# Patient Record
Sex: Female | Born: 1960 | Race: Black or African American | Hispanic: No | Marital: Single | Smoking: Former smoker
Health system: Southern US, Community
[De-identification: ages and names within clinical notes are randomized; demographics above are authoritative.]

## PROBLEM LIST (undated history)

## (undated) DIAGNOSIS — E7800 Pure hypercholesterolemia, unspecified: Secondary | ICD-10-CM

## (undated) DIAGNOSIS — F419 Anxiety disorder, unspecified: Secondary | ICD-10-CM

## (undated) DIAGNOSIS — K2100 Gastro-esophageal reflux disease with esophagitis, without bleeding: Secondary | ICD-10-CM

## (undated) DIAGNOSIS — D649 Anemia, unspecified: Secondary | ICD-10-CM

## (undated) DIAGNOSIS — I959 Hypotension, unspecified: Secondary | ICD-10-CM

## (undated) DIAGNOSIS — F329 Major depressive disorder, single episode, unspecified: Secondary | ICD-10-CM

## (undated) DIAGNOSIS — R002 Palpitations: Secondary | ICD-10-CM

## (undated) DIAGNOSIS — T783XXA Angioneurotic edema, initial encounter: Secondary | ICD-10-CM

## (undated) DIAGNOSIS — N951 Menopausal and female climacteric states: Secondary | ICD-10-CM

## (undated) DIAGNOSIS — F32A Depression, unspecified: Secondary | ICD-10-CM

## (undated) DIAGNOSIS — K429 Umbilical hernia without obstruction or gangrene: Secondary | ICD-10-CM

## (undated) DIAGNOSIS — E669 Obesity, unspecified: Secondary | ICD-10-CM

## (undated) DIAGNOSIS — G4730 Sleep apnea, unspecified: Secondary | ICD-10-CM

## (undated) DIAGNOSIS — K219 Gastro-esophageal reflux disease without esophagitis: Secondary | ICD-10-CM

## (undated) DIAGNOSIS — Z8669 Personal history of other diseases of the nervous system and sense organs: Secondary | ICD-10-CM

## (undated) HISTORY — DX: Palpitations: R00.2

## (undated) HISTORY — DX: Menopausal and female climacteric states: N95.1

## (undated) HISTORY — PX: COLONOSCOPY: SHX174

## (undated) HISTORY — DX: Gastro-esophageal reflux disease with esophagitis: K21.0

## (undated) HISTORY — DX: Major depressive disorder, single episode, unspecified: F32.9

## (undated) HISTORY — DX: Pure hypercholesterolemia, unspecified: E78.00

## (undated) HISTORY — DX: Anemia, unspecified: D64.9

## (undated) HISTORY — DX: Gastro-esophageal reflux disease with esophagitis, without bleeding: K21.00

## (undated) HISTORY — PX: ECTOPIC PREGNANCY SURGERY: SHX613

## (undated) HISTORY — DX: Anxiety disorder, unspecified: F41.9

## (undated) HISTORY — DX: Gastro-esophageal reflux disease without esophagitis: K21.9

## (undated) HISTORY — DX: Angioneurotic edema, initial encounter: T78.3XXA

## (undated) HISTORY — DX: Sleep apnea, unspecified: G47.30

## (undated) HISTORY — DX: Depression, unspecified: F32.A

## (undated) HISTORY — DX: Personal history of other diseases of the nervous system and sense organs: Z86.69

## (undated) HISTORY — PX: HERPES SIMPLEX VIRUS DFA: LAB15028

## (undated) HISTORY — DX: Umbilical hernia without obstruction or gangrene: K42.9

## (undated) HISTORY — DX: Obesity, unspecified: E66.9

---

## 1985-08-20 HISTORY — PX: TUBAL LIGATION: SHX77

## 1998-06-07 ENCOUNTER — Emergency Department (HOSPITAL_COMMUNITY): Admission: EM | Admit: 1998-06-07 | Discharge: 1998-06-08 | Payer: Self-pay | Admitting: Emergency Medicine

## 1998-06-07 ENCOUNTER — Encounter: Payer: Self-pay | Admitting: Emergency Medicine

## 1998-08-20 HISTORY — PX: EXPLORATORY LAPAROTOMY: SUR591

## 1998-09-23 ENCOUNTER — Emergency Department (HOSPITAL_COMMUNITY): Admission: EM | Admit: 1998-09-23 | Discharge: 1998-09-23 | Payer: Self-pay | Admitting: Emergency Medicine

## 1998-11-24 ENCOUNTER — Ambulatory Visit (HOSPITAL_COMMUNITY): Admission: RE | Admit: 1998-11-24 | Discharge: 1998-11-24 | Payer: Self-pay | Admitting: Obstetrics & Gynecology

## 1999-01-16 ENCOUNTER — Emergency Department (HOSPITAL_COMMUNITY): Admission: EM | Admit: 1999-01-16 | Discharge: 1999-01-16 | Payer: Self-pay | Admitting: Emergency Medicine

## 1999-06-30 ENCOUNTER — Other Ambulatory Visit: Admission: RE | Admit: 1999-06-30 | Discharge: 1999-07-06 | Payer: Self-pay

## 2000-01-21 ENCOUNTER — Emergency Department (HOSPITAL_COMMUNITY): Admission: EM | Admit: 2000-01-21 | Discharge: 2000-01-21 | Payer: Self-pay | Admitting: Emergency Medicine

## 2000-01-22 ENCOUNTER — Encounter: Payer: Self-pay | Admitting: Emergency Medicine

## 2000-01-22 ENCOUNTER — Emergency Department (HOSPITAL_COMMUNITY): Admission: EM | Admit: 2000-01-22 | Discharge: 2000-01-22 | Payer: Self-pay | Admitting: Emergency Medicine

## 2000-06-19 ENCOUNTER — Emergency Department (HOSPITAL_COMMUNITY): Admission: EM | Admit: 2000-06-19 | Discharge: 2000-06-19 | Payer: Self-pay | Admitting: Emergency Medicine

## 2000-11-28 ENCOUNTER — Emergency Department (HOSPITAL_COMMUNITY): Admission: EM | Admit: 2000-11-28 | Discharge: 2000-11-28 | Payer: Self-pay | Admitting: Emergency Medicine

## 2001-03-08 ENCOUNTER — Emergency Department (HOSPITAL_COMMUNITY): Admission: EM | Admit: 2001-03-08 | Discharge: 2001-03-08 | Payer: Self-pay | Admitting: Emergency Medicine

## 2001-09-05 ENCOUNTER — Encounter: Payer: Self-pay | Admitting: Emergency Medicine

## 2001-09-05 ENCOUNTER — Emergency Department (HOSPITAL_COMMUNITY): Admission: EM | Admit: 2001-09-05 | Discharge: 2001-09-05 | Payer: Self-pay | Admitting: Emergency Medicine

## 2001-09-18 ENCOUNTER — Encounter: Payer: Self-pay | Admitting: Emergency Medicine

## 2001-09-18 ENCOUNTER — Ambulatory Visit (HOSPITAL_COMMUNITY): Admission: RE | Admit: 2001-09-18 | Discharge: 2001-09-18 | Payer: Self-pay | Admitting: Emergency Medicine

## 2001-12-16 ENCOUNTER — Emergency Department (HOSPITAL_COMMUNITY): Admission: EM | Admit: 2001-12-16 | Discharge: 2001-12-16 | Payer: Self-pay | Admitting: Emergency Medicine

## 2002-01-26 ENCOUNTER — Emergency Department (HOSPITAL_COMMUNITY): Admission: EM | Admit: 2002-01-26 | Discharge: 2002-01-27 | Payer: Self-pay | Admitting: Emergency Medicine

## 2002-03-12 ENCOUNTER — Emergency Department (HOSPITAL_COMMUNITY): Admission: EM | Admit: 2002-03-12 | Discharge: 2002-03-12 | Payer: Self-pay | Admitting: Emergency Medicine

## 2002-05-06 ENCOUNTER — Emergency Department (HOSPITAL_COMMUNITY): Admission: EM | Admit: 2002-05-06 | Discharge: 2002-05-06 | Payer: Self-pay | Admitting: Emergency Medicine

## 2002-05-07 ENCOUNTER — Ambulatory Visit (HOSPITAL_COMMUNITY): Admission: RE | Admit: 2002-05-07 | Discharge: 2002-05-07 | Payer: Self-pay | Admitting: Emergency Medicine

## 2002-05-07 ENCOUNTER — Encounter: Payer: Self-pay | Admitting: Emergency Medicine

## 2002-05-13 ENCOUNTER — Emergency Department (HOSPITAL_COMMUNITY): Admission: EM | Admit: 2002-05-13 | Discharge: 2002-05-13 | Payer: Self-pay | Admitting: Emergency Medicine

## 2002-07-05 ENCOUNTER — Emergency Department (HOSPITAL_COMMUNITY): Admission: EM | Admit: 2002-07-05 | Discharge: 2002-07-06 | Payer: Self-pay | Admitting: Emergency Medicine

## 2002-09-25 ENCOUNTER — Emergency Department (HOSPITAL_COMMUNITY): Admission: EM | Admit: 2002-09-25 | Discharge: 2002-09-25 | Payer: Self-pay | Admitting: Emergency Medicine

## 2002-10-06 ENCOUNTER — Emergency Department (HOSPITAL_COMMUNITY): Admission: EM | Admit: 2002-10-06 | Discharge: 2002-10-06 | Payer: Self-pay | Admitting: Emergency Medicine

## 2002-12-17 ENCOUNTER — Emergency Department (HOSPITAL_COMMUNITY): Admission: EM | Admit: 2002-12-17 | Discharge: 2002-12-17 | Payer: Self-pay | Admitting: Emergency Medicine

## 2003-03-09 ENCOUNTER — Emergency Department (HOSPITAL_COMMUNITY): Admission: EM | Admit: 2003-03-09 | Discharge: 2003-03-09 | Payer: Self-pay | Admitting: Emergency Medicine

## 2003-07-06 ENCOUNTER — Emergency Department (HOSPITAL_COMMUNITY): Admission: EM | Admit: 2003-07-06 | Discharge: 2003-07-06 | Payer: Self-pay | Admitting: Emergency Medicine

## 2004-01-16 ENCOUNTER — Emergency Department (HOSPITAL_COMMUNITY): Admission: EM | Admit: 2004-01-16 | Discharge: 2004-01-16 | Payer: Self-pay | Admitting: Emergency Medicine

## 2004-05-09 ENCOUNTER — Ambulatory Visit: Payer: Self-pay | Admitting: Nurse Practitioner

## 2004-05-11 ENCOUNTER — Encounter: Admission: RE | Admit: 2004-05-11 | Discharge: 2004-05-11 | Payer: Self-pay | Admitting: Family Medicine

## 2004-05-26 ENCOUNTER — Ambulatory Visit: Payer: Self-pay | Admitting: *Deleted

## 2004-06-19 ENCOUNTER — Ambulatory Visit: Payer: Self-pay | Admitting: Nurse Practitioner

## 2004-06-26 ENCOUNTER — Ambulatory Visit (HOSPITAL_COMMUNITY): Admission: RE | Admit: 2004-06-26 | Discharge: 2004-06-26 | Payer: Self-pay | Admitting: Internal Medicine

## 2004-08-08 ENCOUNTER — Ambulatory Visit: Payer: Self-pay | Admitting: Nurse Practitioner

## 2004-12-11 ENCOUNTER — Ambulatory Visit: Payer: Self-pay | Admitting: Internal Medicine

## 2004-12-29 ENCOUNTER — Ambulatory Visit: Payer: Self-pay | Admitting: Internal Medicine

## 2005-07-22 ENCOUNTER — Emergency Department (HOSPITAL_COMMUNITY): Admission: EM | Admit: 2005-07-22 | Discharge: 2005-07-22 | Payer: Self-pay | Admitting: Emergency Medicine

## 2005-09-14 ENCOUNTER — Emergency Department (HOSPITAL_COMMUNITY): Admission: EM | Admit: 2005-09-14 | Discharge: 2005-09-14 | Payer: Self-pay | Admitting: Emergency Medicine

## 2005-10-03 ENCOUNTER — Ambulatory Visit: Payer: Self-pay | Admitting: Family Medicine

## 2005-10-31 ENCOUNTER — Ambulatory Visit: Payer: Self-pay | Admitting: Family Medicine

## 2005-10-31 DIAGNOSIS — D649 Anemia, unspecified: Secondary | ICD-10-CM | POA: Insufficient documentation

## 2005-11-13 ENCOUNTER — Encounter: Admission: RE | Admit: 2005-11-13 | Discharge: 2005-11-13 | Payer: Self-pay | Admitting: Family Medicine

## 2005-12-04 ENCOUNTER — Ambulatory Visit: Payer: Self-pay | Admitting: Family Medicine

## 2005-12-28 ENCOUNTER — Emergency Department (HOSPITAL_COMMUNITY): Admission: EM | Admit: 2005-12-28 | Discharge: 2005-12-28 | Payer: Self-pay | Admitting: Emergency Medicine

## 2006-05-29 ENCOUNTER — Emergency Department (HOSPITAL_COMMUNITY): Admission: EM | Admit: 2006-05-29 | Discharge: 2006-05-29 | Payer: Self-pay | Admitting: Emergency Medicine

## 2007-03-10 ENCOUNTER — Ambulatory Visit: Payer: Self-pay | Admitting: Family Medicine

## 2007-03-10 LAB — CONVERTED CEMR LAB
Albumin: 4.4 g/dL (ref 3.5–5.2)
Alkaline Phosphatase: 44 units/L (ref 39–117)
BUN: 7 mg/dL (ref 6–23)
Basophils Relative: 0 % (ref 0–1)
Calcium: 9.4 mg/dL (ref 8.4–10.5)
Chloride: 107 meq/L (ref 96–112)
Eosinophils Absolute: 0.3 10*3/uL (ref 0.0–0.7)
Eosinophils Relative: 6 % — ABNORMAL HIGH (ref 0–5)
FSH: 34.3 milliintl units/mL
LH: 30.7 milliintl units/mL
Lymphocytes Relative: 51 % — ABNORMAL HIGH (ref 12–46)
Lymphs Abs: 2.2 10*3/uL (ref 0.7–3.3)
Monocytes Absolute: 0.3 10*3/uL (ref 0.2–0.7)
Platelets: 186 10*3/uL (ref 150–400)
RBC: 4.04 M/uL (ref 3.87–5.11)
RDW: 13.8 % (ref 11.5–14.0)
TSH: 1.7 microintl units/mL (ref 0.350–5.50)
Total Protein: 6.6 g/dL (ref 6.0–8.3)

## 2007-03-18 ENCOUNTER — Ambulatory Visit: Payer: Self-pay | Admitting: Family Medicine

## 2007-03-18 ENCOUNTER — Encounter (INDEPENDENT_AMBULATORY_CARE_PROVIDER_SITE_OTHER): Payer: Self-pay | Admitting: Family Medicine

## 2007-03-18 LAB — CONVERTED CEMR LAB

## 2007-04-14 DIAGNOSIS — B353 Tinea pedis: Secondary | ICD-10-CM | POA: Insufficient documentation

## 2007-04-14 DIAGNOSIS — F319 Bipolar disorder, unspecified: Secondary | ICD-10-CM | POA: Insufficient documentation

## 2007-04-14 HISTORY — DX: Bipolar disorder, unspecified: F31.9

## 2007-05-19 ENCOUNTER — Emergency Department (HOSPITAL_COMMUNITY): Admission: EM | Admit: 2007-05-19 | Discharge: 2007-05-19 | Payer: Self-pay | Admitting: Emergency Medicine

## 2007-08-18 ENCOUNTER — Telehealth (INDEPENDENT_AMBULATORY_CARE_PROVIDER_SITE_OTHER): Payer: Self-pay | Admitting: Nurse Practitioner

## 2007-09-18 ENCOUNTER — Encounter (INDEPENDENT_AMBULATORY_CARE_PROVIDER_SITE_OTHER): Payer: Self-pay | Admitting: Family Medicine

## 2007-09-18 ENCOUNTER — Ambulatory Visit: Payer: Self-pay | Admitting: Cardiology

## 2007-09-26 ENCOUNTER — Ambulatory Visit: Payer: Self-pay

## 2007-09-26 ENCOUNTER — Encounter: Payer: Self-pay | Admitting: Cardiology

## 2007-10-07 ENCOUNTER — Ambulatory Visit: Payer: Self-pay | Admitting: Cardiology

## 2007-10-29 ENCOUNTER — Emergency Department (HOSPITAL_COMMUNITY): Admission: EM | Admit: 2007-10-29 | Discharge: 2007-10-29 | Payer: Self-pay | Admitting: Emergency Medicine

## 2008-01-08 ENCOUNTER — Emergency Department (HOSPITAL_COMMUNITY): Admission: EM | Admit: 2008-01-08 | Discharge: 2008-01-09 | Payer: Self-pay | Admitting: Emergency Medicine

## 2008-02-17 ENCOUNTER — Telehealth (INDEPENDENT_AMBULATORY_CARE_PROVIDER_SITE_OTHER): Payer: Self-pay | Admitting: *Deleted

## 2008-03-17 ENCOUNTER — Encounter (INDEPENDENT_AMBULATORY_CARE_PROVIDER_SITE_OTHER): Payer: Self-pay | Admitting: Nurse Practitioner

## 2008-03-17 ENCOUNTER — Ambulatory Visit: Payer: Self-pay | Admitting: Family Medicine

## 2008-03-17 DIAGNOSIS — N760 Acute vaginitis: Secondary | ICD-10-CM | POA: Insufficient documentation

## 2008-04-07 ENCOUNTER — Emergency Department (HOSPITAL_COMMUNITY): Admission: EM | Admit: 2008-04-07 | Discharge: 2008-04-07 | Payer: Self-pay | Admitting: Emergency Medicine

## 2008-05-04 ENCOUNTER — Ambulatory Visit: Payer: Self-pay | Admitting: Family Medicine

## 2008-05-04 ENCOUNTER — Other Ambulatory Visit: Admission: RE | Admit: 2008-05-04 | Discharge: 2008-05-04 | Payer: Self-pay | Admitting: Family Medicine

## 2008-05-04 ENCOUNTER — Encounter (INDEPENDENT_AMBULATORY_CARE_PROVIDER_SITE_OTHER): Payer: Self-pay | Admitting: Family Medicine

## 2008-05-04 LAB — CONVERTED CEMR LAB
Chlamydia, DNA Probe: NEGATIVE
GC Probe Amp, Genital: NEGATIVE
Glucose, Urine, Semiquant: NEGATIVE
Protein, U semiquant: NEGATIVE
Urobilinogen, UA: 0.2
pH: 6.5

## 2008-05-09 ENCOUNTER — Encounter (INDEPENDENT_AMBULATORY_CARE_PROVIDER_SITE_OTHER): Payer: Self-pay | Admitting: Family Medicine

## 2008-06-24 ENCOUNTER — Emergency Department (HOSPITAL_COMMUNITY): Admission: EM | Admit: 2008-06-24 | Discharge: 2008-06-24 | Payer: Self-pay | Admitting: Emergency Medicine

## 2008-08-12 ENCOUNTER — Emergency Department (HOSPITAL_COMMUNITY): Admission: EM | Admit: 2008-08-12 | Discharge: 2008-08-12 | Payer: Self-pay | Admitting: Emergency Medicine

## 2008-08-30 ENCOUNTER — Telehealth (INDEPENDENT_AMBULATORY_CARE_PROVIDER_SITE_OTHER): Payer: Self-pay | Admitting: Family Medicine

## 2008-09-09 ENCOUNTER — Encounter (INDEPENDENT_AMBULATORY_CARE_PROVIDER_SITE_OTHER): Payer: Self-pay | Admitting: *Deleted

## 2008-09-10 ENCOUNTER — Emergency Department (HOSPITAL_COMMUNITY): Admission: EM | Admit: 2008-09-10 | Discharge: 2008-09-10 | Payer: Self-pay | Admitting: Emergency Medicine

## 2008-10-19 ENCOUNTER — Ambulatory Visit: Payer: Self-pay | Admitting: Family Medicine

## 2008-10-19 DIAGNOSIS — R498 Other voice and resonance disorders: Secondary | ICD-10-CM | POA: Insufficient documentation

## 2008-10-30 ENCOUNTER — Emergency Department (HOSPITAL_COMMUNITY): Admission: EM | Admit: 2008-10-30 | Discharge: 2008-10-30 | Payer: Self-pay | Admitting: Emergency Medicine

## 2008-11-02 ENCOUNTER — Encounter (INDEPENDENT_AMBULATORY_CARE_PROVIDER_SITE_OTHER): Payer: Self-pay | Admitting: Family Medicine

## 2008-11-08 ENCOUNTER — Encounter (INDEPENDENT_AMBULATORY_CARE_PROVIDER_SITE_OTHER): Payer: Self-pay | Admitting: Family Medicine

## 2008-11-25 ENCOUNTER — Encounter (INDEPENDENT_AMBULATORY_CARE_PROVIDER_SITE_OTHER): Payer: Self-pay | Admitting: Family Medicine

## 2009-02-21 ENCOUNTER — Emergency Department (HOSPITAL_COMMUNITY): Admission: EM | Admit: 2009-02-21 | Discharge: 2009-02-21 | Payer: Self-pay | Admitting: Emergency Medicine

## 2009-02-26 ENCOUNTER — Emergency Department (HOSPITAL_COMMUNITY): Admission: EM | Admit: 2009-02-26 | Discharge: 2009-02-26 | Payer: Self-pay | Admitting: Emergency Medicine

## 2009-07-21 ENCOUNTER — Emergency Department (HOSPITAL_COMMUNITY): Admission: EM | Admit: 2009-07-21 | Discharge: 2009-07-21 | Payer: Self-pay | Admitting: Emergency Medicine

## 2009-09-08 ENCOUNTER — Emergency Department (HOSPITAL_COMMUNITY): Admission: EM | Admit: 2009-09-08 | Discharge: 2009-09-08 | Payer: Self-pay | Admitting: Emergency Medicine

## 2009-10-04 ENCOUNTER — Ambulatory Visit: Payer: Self-pay | Admitting: Physician Assistant

## 2009-10-04 DIAGNOSIS — K111 Hypertrophy of salivary gland: Secondary | ICD-10-CM | POA: Insufficient documentation

## 2009-10-04 DIAGNOSIS — B354 Tinea corporis: Secondary | ICD-10-CM | POA: Insufficient documentation

## 2009-10-04 LAB — CONVERTED CEMR LAB: KOH Prep: NEGATIVE

## 2009-10-05 ENCOUNTER — Encounter: Payer: Self-pay | Admitting: Physician Assistant

## 2009-10-06 ENCOUNTER — Encounter: Payer: Self-pay | Admitting: Physician Assistant

## 2009-10-06 LAB — CONVERTED CEMR LAB
Eosinophils Relative: 2 % (ref 0–5)
Lymphocytes Relative: 44 % (ref 12–46)
Lymphs Abs: 2.3 10*3/uL (ref 0.7–4.0)
Monocytes Relative: 7 % (ref 3–12)
Neutrophils Relative %: 47 % (ref 43–77)
RBC: 4.62 M/uL (ref 3.87–5.11)
RDW: 14.9 % (ref 11.5–15.5)

## 2009-10-12 ENCOUNTER — Encounter (INDEPENDENT_AMBULATORY_CARE_PROVIDER_SITE_OTHER): Payer: Self-pay | Admitting: *Deleted

## 2009-10-16 ENCOUNTER — Encounter: Payer: Self-pay | Admitting: Physician Assistant

## 2009-11-08 ENCOUNTER — Telehealth (INDEPENDENT_AMBULATORY_CARE_PROVIDER_SITE_OTHER): Payer: Self-pay | Admitting: *Deleted

## 2009-12-02 ENCOUNTER — Encounter: Payer: Self-pay | Admitting: Physician Assistant

## 2009-12-07 ENCOUNTER — Encounter: Admission: RE | Admit: 2009-12-07 | Discharge: 2009-12-07 | Payer: Self-pay | Admitting: Otolaryngology

## 2009-12-16 ENCOUNTER — Encounter: Payer: Self-pay | Admitting: Physician Assistant

## 2009-12-16 DIAGNOSIS — K219 Gastro-esophageal reflux disease without esophagitis: Secondary | ICD-10-CM | POA: Insufficient documentation

## 2009-12-16 HISTORY — DX: Gastro-esophageal reflux disease without esophagitis: K21.9

## 2009-12-22 ENCOUNTER — Telehealth (INDEPENDENT_AMBULATORY_CARE_PROVIDER_SITE_OTHER): Payer: Self-pay | Admitting: Nurse Practitioner

## 2009-12-22 ENCOUNTER — Encounter: Payer: Self-pay | Admitting: Physician Assistant

## 2009-12-22 ENCOUNTER — Ambulatory Visit: Payer: Self-pay | Admitting: Nurse Practitioner

## 2009-12-22 ENCOUNTER — Other Ambulatory Visit: Admission: RE | Admit: 2009-12-22 | Discharge: 2009-12-22 | Payer: Self-pay | Admitting: Internal Medicine

## 2009-12-22 DIAGNOSIS — R3129 Other microscopic hematuria: Secondary | ICD-10-CM | POA: Insufficient documentation

## 2009-12-22 DIAGNOSIS — F17200 Nicotine dependence, unspecified, uncomplicated: Secondary | ICD-10-CM | POA: Insufficient documentation

## 2009-12-22 DIAGNOSIS — B370 Candidal stomatitis: Secondary | ICD-10-CM | POA: Insufficient documentation

## 2009-12-22 LAB — CONVERTED CEMR LAB
Bilirubin Urine: NEGATIVE
Nitrite: NEGATIVE
Rapid HIV Screen: NEGATIVE
Specific Gravity, Urine: 1.02
Urobilinogen, UA: 0.2
WBC Urine, dipstick: NEGATIVE
pH: 7.5

## 2009-12-23 ENCOUNTER — Encounter (INDEPENDENT_AMBULATORY_CARE_PROVIDER_SITE_OTHER): Payer: Self-pay | Admitting: Nurse Practitioner

## 2009-12-26 ENCOUNTER — Encounter (INDEPENDENT_AMBULATORY_CARE_PROVIDER_SITE_OTHER): Payer: Self-pay | Admitting: Nurse Practitioner

## 2009-12-26 LAB — CONVERTED CEMR LAB
Albumin: 4.5 g/dL (ref 3.5–5.2)
Basophils Absolute: 0 10*3/uL (ref 0.0–0.1)
Basophils Relative: 0 % (ref 0–1)
CO2: 24 meq/L (ref 19–32)
Chloride: 107 meq/L (ref 96–112)
Creatinine, Ser: 0.79 mg/dL (ref 0.40–1.20)
HCT: 34.4 % — ABNORMAL LOW (ref 36.0–46.0)
Lymphocytes Relative: 41 % (ref 12–46)
Lymphs Abs: 1.8 10*3/uL (ref 0.7–4.0)
MCHC: 33.1 g/dL (ref 30.0–36.0)
MCV: 93 fL (ref 78.0–100.0)
Monocytes Relative: 8 % (ref 3–12)
Neutro Abs: 2 10*3/uL (ref 1.7–7.7)
Neutrophils Relative %: 46 % (ref 43–77)
Pap Smear: NEGATIVE
Potassium: 3.8 meq/L (ref 3.5–5.3)
Total Protein: 6.5 g/dL (ref 6.0–8.3)
WBC: 4.4 10*3/uL (ref 4.0–10.5)

## 2009-12-27 ENCOUNTER — Ambulatory Visit (HOSPITAL_COMMUNITY): Admission: RE | Admit: 2009-12-27 | Discharge: 2009-12-27 | Payer: Self-pay | Admitting: Internal Medicine

## 2009-12-28 ENCOUNTER — Encounter: Payer: Self-pay | Admitting: Physician Assistant

## 2009-12-29 ENCOUNTER — Encounter (INDEPENDENT_AMBULATORY_CARE_PROVIDER_SITE_OTHER): Payer: Self-pay | Admitting: *Deleted

## 2010-01-04 ENCOUNTER — Ambulatory Visit: Payer: Self-pay | Admitting: Physician Assistant

## 2010-01-05 ENCOUNTER — Encounter: Payer: Self-pay | Admitting: Physician Assistant

## 2010-01-05 LAB — CONVERTED CEMR LAB
Cholesterol, target level: 200 mg/dL
Cholesterol: 200 mg/dL (ref 0–200)
HDL goal, serum: 40 mg/dL
LDL Cholesterol: 123 mg/dL — ABNORMAL HIGH (ref 0–99)
Total CHOL/HDL Ratio: 3.1
VLDL: 13 mg/dL (ref 0–40)

## 2010-02-13 ENCOUNTER — Encounter (INDEPENDENT_AMBULATORY_CARE_PROVIDER_SITE_OTHER): Payer: Self-pay | Admitting: *Deleted

## 2010-02-13 ENCOUNTER — Encounter: Payer: Self-pay | Admitting: Physician Assistant

## 2010-02-13 ENCOUNTER — Ambulatory Visit: Payer: Self-pay | Admitting: Internal Medicine

## 2010-02-13 DIAGNOSIS — R198 Other specified symptoms and signs involving the digestive system and abdomen: Secondary | ICD-10-CM | POA: Insufficient documentation

## 2010-02-13 DIAGNOSIS — K6289 Other specified diseases of anus and rectum: Secondary | ICD-10-CM | POA: Insufficient documentation

## 2010-02-28 ENCOUNTER — Encounter: Payer: Self-pay | Admitting: Physician Assistant

## 2010-03-02 ENCOUNTER — Ambulatory Visit: Payer: Self-pay | Admitting: Nurse Practitioner

## 2010-03-02 DIAGNOSIS — R300 Dysuria: Secondary | ICD-10-CM | POA: Insufficient documentation

## 2010-03-02 LAB — CONVERTED CEMR LAB
Glucose, Urine, Semiquant: NEGATIVE
Ketones, urine, test strip: NEGATIVE
Protein, U semiquant: NEGATIVE

## 2010-03-03 ENCOUNTER — Encounter (INDEPENDENT_AMBULATORY_CARE_PROVIDER_SITE_OTHER): Payer: Self-pay | Admitting: Nurse Practitioner

## 2010-03-07 ENCOUNTER — Telehealth (INDEPENDENT_AMBULATORY_CARE_PROVIDER_SITE_OTHER): Payer: Self-pay

## 2010-03-07 ENCOUNTER — Encounter (INDEPENDENT_AMBULATORY_CARE_PROVIDER_SITE_OTHER): Payer: Self-pay | Admitting: *Deleted

## 2010-03-08 ENCOUNTER — Telehealth: Payer: Self-pay | Admitting: Internal Medicine

## 2010-03-17 ENCOUNTER — Emergency Department (HOSPITAL_COMMUNITY): Admission: EM | Admit: 2010-03-17 | Discharge: 2010-03-17 | Payer: Self-pay | Admitting: Emergency Medicine

## 2010-04-25 ENCOUNTER — Telehealth: Payer: Self-pay | Admitting: Physician Assistant

## 2010-04-26 ENCOUNTER — Ambulatory Visit: Payer: Self-pay | Admitting: Physician Assistant

## 2010-04-26 LAB — CONVERTED CEMR LAB
Protein, U semiquant: NEGATIVE
Urobilinogen, UA: 0.2

## 2010-04-27 ENCOUNTER — Encounter (INDEPENDENT_AMBULATORY_CARE_PROVIDER_SITE_OTHER): Payer: Self-pay | Admitting: Nurse Practitioner

## 2010-04-30 ENCOUNTER — Emergency Department (HOSPITAL_COMMUNITY): Admission: EM | Admit: 2010-04-30 | Discharge: 2010-04-30 | Payer: Self-pay | Admitting: Emergency Medicine

## 2010-05-01 ENCOUNTER — Ambulatory Visit: Payer: Self-pay | Admitting: Nurse Practitioner

## 2010-05-01 DIAGNOSIS — N3000 Acute cystitis without hematuria: Secondary | ICD-10-CM | POA: Insufficient documentation

## 2010-06-19 ENCOUNTER — Emergency Department (HOSPITAL_COMMUNITY): Admission: EM | Admit: 2010-06-19 | Discharge: 2010-06-20 | Payer: Self-pay | Admitting: Emergency Medicine

## 2010-06-30 ENCOUNTER — Ambulatory Visit: Payer: Self-pay | Admitting: Nurse Practitioner

## 2010-06-30 DIAGNOSIS — N959 Unspecified menopausal and perimenopausal disorder: Secondary | ICD-10-CM | POA: Insufficient documentation

## 2010-06-30 LAB — CONVERTED CEMR LAB
Bilirubin Urine: NEGATIVE
Glucose, Urine, Semiquant: NEGATIVE
Ketones, urine, test strip: NEGATIVE
Nitrite: NEGATIVE
Specific Gravity, Urine: 1.015
Urobilinogen, UA: 0.2

## 2010-07-28 ENCOUNTER — Emergency Department (HOSPITAL_COMMUNITY)
Admission: EM | Admit: 2010-07-28 | Discharge: 2010-07-29 | Payer: Self-pay | Source: Home / Self Care | Admitting: Emergency Medicine

## 2010-07-31 ENCOUNTER — Telehealth (INDEPENDENT_AMBULATORY_CARE_PROVIDER_SITE_OTHER): Payer: Self-pay | Admitting: Nurse Practitioner

## 2010-07-31 ENCOUNTER — Encounter (INDEPENDENT_AMBULATORY_CARE_PROVIDER_SITE_OTHER): Payer: Self-pay | Admitting: Nurse Practitioner

## 2010-07-31 ENCOUNTER — Ambulatory Visit: Payer: Self-pay | Admitting: Nurse Practitioner

## 2010-07-31 LAB — CONVERTED CEMR LAB
Glucose, Urine, Semiquant: NEGATIVE
Nitrite: NEGATIVE
Urobilinogen, UA: 0.2
pH: 5

## 2010-09-09 ENCOUNTER — Encounter: Payer: Self-pay | Admitting: Obstetrics & Gynecology

## 2010-09-10 ENCOUNTER — Encounter: Payer: Self-pay | Admitting: Family Medicine

## 2010-09-17 LAB — CONVERTED CEMR LAB
Ferritin: 25 ng/mL (ref 10–291)
Folate: 14.4 ng/mL
Iron: 87 ug/dL (ref 42–145)
UIBC: 238 ug/dL
Vitamin B-12: 230 pg/mL (ref 211–911)

## 2010-09-19 NOTE — Letter (Signed)
Summary: *HSN Results Follow up  [facility]  [address] [place], [place] [address]   Phone: [phone]  Fax: [phone]      12/26/2009   Nicole [name] [address] [place], [place]  [address]   Dear  [name],                            ____S.Drinkard,FNP   ____D. Gore,FNP       ____B. McPherson,MD   ____V. Rankins,MD    ____E. Mulberry,MD    __X__N. Daphine Deutscher, FNP  ____D. Reche Dixon, MD    ____K. Philipp Deputy, MD    ____Other     This letter is to inform you that your recent test(s):  __X_____Pap Smear    _______Lab Test     _______X-ray    ____X___ is within acceptable limits  _______ requires a medication change  _______ requires a follow-up lab visit  _______ requires a follow-up visit with your provider   Comments: Pap Smear results are normal.      _________________________________________________________ If you have any questions, please contact our office [phone].                    Sincerely,    Lehman Prom FNP [facility]

## 2010-09-19 NOTE — Assessment & Plan Note (Signed)
Summary: Acute - Dysuria   Vital Signs:  Patient profile:   50 year old female Menstrual status:  regular Weight:      148.7 pounds BMI:     24.46 BSA:     1.76 Temp:     97.5 degrees F oral Pulse rate:   68 / minute Pulse rhythm:   regular Resp:     16 per minute BP sitting:   106 / 74  (left arm) Cuff size:   regular  Vitals Entered By: Levon Hedger (March 02, 2010 11:06 AM) CC: odor in urine, burning in vaginal area, Dysuria Is Patient Diabetic? No Pain Assessment Patient in pain? no       Does patient need assistance? Functional Status Self care Ambulation Normal   Primary Care Provider:  Lehman Prom, FNP   CC:  odor in urine, burning in vaginal area, and Dysuria.  History of Present Illness:  Pt into the office with complaints of urinary problems  Started 1 week ago  Dysuria      This is a 50 year old woman who presents with Dysuria.  The symptoms began 1 week ago.  The intensity is described as moderate.  The patient complains of burning with urination and urinary frequency, but denies hematuria, vaginal discharge, and vaginal itching.  The patient denies the following associated symptoms: nausea, vomiting, and fever.  The patient denies the following risk factors: diabetes.  History is significant for recent UTI.  Notes that she does have a sensitive vaginal area. Drinks caffinated sodas Social ETOH on July 4th   Menses is irregular - last about 3-4 months ago.  Habits & Providers  Alcohol-Tobacco-Diet     Alcohol drinks/day: 0     Alcohol type: beer and wine     Tobacco Status: current     Tobacco Counseling: to quit use of tobacco products     Cigarette Packs/Day: <0.25     Year Started: 60's  Exercise-Depression-Behavior     Does Patient Exercise: yes     Type of exercise: walking     Times/week: 7     Drug Use: yes     Seat Belt Use: 50     Sun Exposure: occasionally  Comments: tobacco - quit date is 03/20/2010  Allergies  (verified): No Known Drug Allergies  Social History: Packs/Day:  <0.25  Review of Systems CV:  Denies chest pain or discomfort. Resp:  Denies cough. GI:  pt was scheduled for a colonscopy but she rescheduled because she wanted to eat for the July 4th holiday.  She has rescheduled on 03/17/2010. GU:  Complains of dysuria and urinary frequency.  Physical Exam  General:  alert.   Head:  normocephalic.   Lungs:  normal breath sounds.   Heart:  normal rate and regular rhythm.   Abdomen:  BS x 4 nontender Msk:  up to the exam table Neurologic:  alert & oriented X3.   Skin:  color normal.   Psych:  Oriented X3.     Impression & Recommendations:  Problem # 1:  DYSURIA (ICD-788.1) will send urine for culture and treat pt if returns positive for now will give meds for spasms advised to avoid triggers Her updated medication list for this problem includes:    Phenazopyridine Hcl 200 Mg Tabs (Phenazopyridine hcl) ..... One tablet by mouth three times a day for bladder  Orders: UA Dipstick w/o Micro (manual) (81191) T-Culture, Urine (47829-56213)  Problem # 2:  MICROSCOPIC HEMATURIA (ICD-599.72)  Orders: T-Culture, Urine ([id number])  Complete Medication List: 1)  Lotrisone 1-0.05 % Crea (Clotrimazole-betamethasone) .... Apply to rash on arm two times a day for 2 weeks 2)  Moviprep 100 Gm Solr (Peg-kcl-nacl-nasulf-na asc-c) .... As per prep instructions. 3)  Phenazopyridine Hcl 200 Mg Tabs (Phenazopyridine hcl) .... One tablet by mouth three times a day for bladder  Patient Instructions: 1)  Will send urine to the lab for culture to see if it has infection.  This will take at least 3 days for the results. 2)  Over the weekend may take phenazopyridine 200mg  by mouth three times a day for bladder spasms.  Be advised this may change the color of your urine to orange 3)  Keep your appointment for colonscopy as scheduled Prescriptions: PHENAZOPYRIDINE HCL 200 MG TABS  (PHENAZOPYRIDINE HCL) One tablet by mouth three times a day for bladder  #15 x 0   Entered and Authorized by:   Lehman Prom FNP   Signed by:   Lehman Prom FNP on 03/02/2010   Method used:   Print then Give to Patient   RxID:   [id number]   Laboratory Results   Urine Tests  Date/Time Received: March 02, 2010 12:02 PM   Routine Urinalysis   Color: cloudy Glucose: negative   (Normal Range: Negative) Bilirubin: negative   (Normal Range: Negative) Ketone: negative   (Normal Range: Negative) Spec. Gravity: 1.010   (Normal Range: 1.003-1.035) Blood: trace-intact   (Normal Range: Negative) pH: 5.5   (Normal Range: 5.0-8.0) Protein: negative   (Normal Range: Negative) Urobilinogen: 0.2   (Normal Range: 0-1) Nitrite: negative   (Normal Range: Negative) Leukocyte Esterace: negative   (Normal Range: Negative)

## 2010-09-19 NOTE — Miscellaneous (Signed)
Summary: seen by ENT for enlarged salivary gland in neck 09/2009  Clinical Lists Changes  Problems: Assessed SALIVARY GLAND HYPERTROPHY as comment only - seen by Dr. Christia Reading 2.17.2011 dx with sialoadenitis no specific therapy recommended she is to f/u with Dr. 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 Christia Reading 2.17.2011 dx with sialoadenitis no specific therapy recommended she is to f/u with Dr. Jenne Pane as needed  Complete Medication List: 1)  Econazole Nitrate 1 % Crea (Econazole nitrate) .... Apply to foot rash two times a day x 3 weeks 2)  Lotrisone 1-0.05 % Crea (Clotrimazole-betamethasone) .... Apply to rash on arm two times a day for 2 weeks

## 2010-09-19 NOTE — Letter (Signed)
Summary: *HSN Results Follow up  [facility]  [address] [place], [place] [address]   Phone: [phone]  Fax: [phone]      10/12/2009   Nicole [name] [place] [address] [place], [place]  [address]   Dear  [name],                            ____S.Drinkard,FNP   ____D. Gore,FNP       ____B. McPherson,MD   ____V. Rankins,MD    ____E. Mulberry,MD    ____N. Daphine Deutscher, FNP  ____D. Reche Dixon, MD    ____K. Philipp Deputy, MD    ____Other     This letter is to inform you that your recent test(s):  _______Pap Smear    _______Lab Test     _______X-ray    _______ is within acceptable limits  _______ requires a medication change  _______ requires a follow-up lab visit  _______ requires a follow-up visit with your provider   Comments:  We have been trying to reach you.  Please give [place] a call at your earliest convenience.       _________________________________________________________ If you have any questions, please contact our office                     Sincerely,  [name] [facility]

## 2010-09-19 NOTE — Assessment & Plan Note (Signed)
Summary: Patient never seen. . . she only wanted to schedule CPE.   Vital Signs:  Patient profile:   50 year old female Menstrual status:  regular Height:      65.5 inches Weight:      147 pounds BMI:     24.18 Temp:     97.5 degrees F oral Pulse rate:   89 / minute Pulse rhythm:   regular Resp:     18 per minute BP sitting:   101 / 64  (left arm) Cuff size:   regular  Vitals Entered By: [name] (December 05, 2009 3:51 PM) CC: pt wants a referral to GI... pt wants  appt for cpe... Is Patient Diabetic? No Pain Assessment Patient in pain? no       Does patient need assistance? Functional Status Self care Ambulation Normal   CC:  pt wants a referral to GI... pt wants  appt for cpe....  Current Medications (verified): 1)  Econazole Nitrate 1 % Crea (Econazole Nitrate) .... Apply To Foot Rash Two Times A Day X 3 Weeks 2)  Lotrisone 1-0.05 % Crea (Clotrimazole-Betamethasone) .... Apply To Rash On Arm Two Times A Day For 2 Weeks  Allergies (verified): No Known Drug Allergies   Complete Medication List: 1)  Econazole Nitrate 1 % Crea (Econazole nitrate) .... Apply to foot rash two times a day x 3 weeks 2)  Lotrisone 1-0.05 % Crea (Clotrimazole-betamethasone) .... Apply to rash on arm two times a day for 2 weeks

## 2010-09-19 NOTE — Progress Notes (Signed)
Summary: Colonscopy  Phone Note Outgoing Call   Summary of Call: pt needs a screening colonscopy no family hx of colon cancer some constipation but no blood in stool Initial call taken by: Lehman Prom FNP,  Dec 22, 2009 4:41 PM

## 2010-09-19 NOTE — Progress Notes (Signed)
Summary: Requesting for changing provider  Phone Note Call from Patient Call back at [facility] Phone [phone]   Summary of Call: The pt wants to have a female provider rather than a female provider.  Pt is wondered if that could be possible.   Initial call taken by: Manon Hilding,  November 08, 2009 9:30 AM  Follow-up for Phone Call        If that is her only reasoning than not at this time due to scheduling restraints... 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  Pt states that he is wonderful gentleman and is not personal but she needs a female provider because before she has Dr Barbaraann Barthel. Manon Hilding  November 10, 2009 10:06 AM    Additional Follow-up for Phone Call Additional follow up Details #2::    Again due to scheduling restraints we are not able to transfer patient to another providers panel. We will accomadate the patient with a female provider when pt is experiencing female issues/concerns with no exceptions. We do apologize for the inconvenience. Follow-up by: Mikey College CMA,  November 10, 2009 3:35 PM  Additional Follow-up for Phone Call Additional follow up Details #3:: Details for Additional Follow-up Action Taken: Left a message to the pt to call me back.Manon Hilding  November 17, 2009 12:00 PM PT CALLED 11/21/09 /INFORMED HER OF MESSAGE Additional Follow-up by: Arta Bruce,  November 21, 2009 9:19 AM

## 2010-09-19 NOTE — Letter (Signed)
Summary: [facility] Instructions  [facility]  [address] [place], [place] [address]   Phone: [phone]  Fax: [phone]       AUTIE VASUDEVAN    [birth year]    MRN: [id number]      Procedure Day Dorna Bloom: Lulu Riding, 02/22/10     Arrival Time: 1:00 PM      Procedure Time: 2:00 PM    Location of Procedure:                    _X_  [facility] (4th Floor)  PREPARATION FOR COLONOSCOPY WITH MOVIPREP   Starting 5 days prior to your procedure 02/17/10 do not eat nuts, seeds, popcorn, corn, beans, peas,  salads, or any raw vegetables.  Do not take any fiber supplements (e.g. Metamucil, Citrucel, and Benefiber).  THE DAY BEFORE YOUR PROCEDURE         TUESDAY, 02/21/10  1.  Drink clear liquids the entire day-NO SOLID FOOD  2.  Do not drink anything colored red or purple.  Avoid juices with pulp.  No orange juice.  3.  Drink at least 64 oz. (8 glasses) of fluid/clear liquids during the day to prevent dehydration and help the prep work efficiently.  CLEAR LIQUIDS INCLUDE: Water Jello Ice Popsicles Tea (sugar ok, no milk/cream) Powdered fruit flavored drinks Coffee (sugar ok, no milk/cream) Gatorade Juice: apple, white grape, white cranberry  Lemonade Clear bullion, consomm, broth Carbonated beverages (any kind) Strained chicken noodle soup Hard Candy                             4.  In the morning, mix first dose of MoviPrep solution:    Empty 1 Pouch A and 1 Pouch B into the disposable container    Add lukewarm drinking water to the top line of the container. Mix to dissolve    Refrigerate (mixed solution should be used within 24 hrs)  5.  Begin drinking the prep at 5:00 p.m. The MoviPrep container is divided by 4 marks.   Every 15 minutes drink the solution down to the next mark (approximately 8 oz) until the full liter is complete.   6.  Follow completed prep with 16 oz of clear liquid of your choice (Nothing red or purple).  Continue to drink clear  liquids until bedtime.  7.  Before going to bed, mix second dose of MoviPrep solution:    Empty 1 Pouch A and 1 Pouch B into the disposable container    Add lukewarm drinking water to the top line of the container. Mix to dissolve    Refrigerate  THE DAY OF YOUR PROCEDURE      WEDNESDAY, 02/22/10  Beginning at 9:00 a.m. (5 hours before procedure):         1. Every 15 minutes, drink the solution down to the next mark (approx 8 oz) until the full liter is complete.  2. Follow completed prep with 16 oz. of clear liquid of your choice.    3. You may drink clear liquids until 12:00 PM (2 HOURS BEFORE PROCEDURE).  MEDICATION INSTRUCTIONS  Unless otherwise instructed, you should take regular prescription medications with a small sip of water   as early as possible the morning of your procedure.       OTHER INSTRUCTIONS  You will need a responsible adult at least 52 years of age to accompany you and drive you home.  This person must remain in the waiting room during your procedure.  Wear loose fitting clothing that is easily removed.  Leave jewelry and other valuables at home.  However, you may wish to bring a book to read or  an iPod/MP3 player to listen to music as you wait for your procedure to start.  Remove all body piercing jewelry and leave at home.  Total time from sign-in until discharge is approximately 2-3 hours.  You should go home directly after your procedure and rest.  You can resume normal activities the  day after your procedure.  The day of your procedure you should not:   Drive   Make legal decisions   Operate machinery   Drink alcohol   Return to work  You will receive specific instructions about eating, activities and medications before you leave.   The above instructions have been reviewed and explained to me by   [facility], CMA  I fully understand and can verbalize these instructions _____________________________ Date 02/13/10

## 2010-09-19 NOTE — Progress Notes (Signed)
Summary: ACUTE BLADDER PROBLEMS  Phone Note Call from Patient Call back at [phone]   Reason for Call: Acute Illness Complaint: Urinary/GYN Problems Summary of Call: POSS BLADDER INFECTION, PAIN IN KIDNEYS AND BLADDER  Initial call taken by: Oscar La,  April 25, 2010 11:54 AM  Follow-up for Phone Call        Having problems with bladder and kidneys.  Had a bladder infection about two months ago.  States she was prescribed medication, but did not take as ordered and there was a month and 1/2 gap between the diagnosis and her getting the med.  Having pain in her lower back and in her lower part of her abdomen.  Denies dysuria,  but is having pressure.  Urine smells "old".  Urine changes color, no specific.  Denies hematuria.  Having urgency and frequency, incontinence.  States her stomach is bloated.  Not sure if she's running a fever, but has been having chills.    Offered appt. with Wende Mott tomorrow 04/26/10 but refused.  Wants appt. with Jesse Fall -- when advised that next available appt. is 05/01/10, pt. stated she would go to urgent care, but still see provider on 9/12.   Follow-up by: Dutch Quint RN,  April 25, 2010 2:45 PM  Additional Follow-up for Phone Call Additional follow up Details #1::        You can also offer pt a triage appt for u/a and  then will send urine for culture to check for infection since her appt is not until next week Additional Follow-up by: Lehman Prom FNP,  April 25, 2010 3:11 PM    Additional Follow-up for Phone Call Additional follow up Details #2::    Offered pt. triage appt. -- set for 04/26/10.  Dutch Quint RN  April 25, 2010 4:26 PM  Pt. in office.  Dutch Quint RN  April 26, 2010 11:02 AM

## 2010-09-19 NOTE — Progress Notes (Signed)
Summary: NO Show Colonoscopy  Phone Note Call from Patient   Call For: Dr Leone Payor Summary of Call: Patient NO SHOWED for procedure yeterday. Marylene Land from [facility] called and patient called back and said she  thought it was a diffrent day. Do you want to charge the fee? Initial call taken by: Leanor Kail [facility],  March 08, 2010 8:08 AM  Follow-up for Phone Call        NO Follow-up by: Iva Boop MD, [name],  March 08, 2010 8:31 AM

## 2010-09-19 NOTE — Progress Notes (Signed)
  No show for colonoscopy today.  No answer @ both listed contact numbers.  Appended Document:  Pt returned vm stating she thought her appt was on a different day. She said she of course did not prep, but she would call back to reschedule the appt and wanted to make sure we could renew her RX.

## 2010-09-19 NOTE — Assessment & Plan Note (Signed)
Summary: VAGINAL INFECTION/ COLD//GK   Vital Signs:  Patient profile:   50 year old female Menstrual status:  regular LMP:     09/27/2009 Weight:      147.1 pounds BMI:     24.19 Temp:     98.0 degrees F oral Pulse rate:   82 / minute Pulse rhythm:   regular Resp:     18 per minute BP sitting:   100 / 68  (left arm) Cuff size:   regular  Vitals Entered By: Geanie Cooley  (October 04, 2009 4:23 PM) CC: Pt states she has a bite mark on her arm, from something bit her.  Pt also has a small knot on the left side oh her neck right below the jaw. Pain Assessment Patient in pain? no       Does patient need assistance? Functional Status Self care Ambulation Normal LMP (date): 09/27/2009     Menstrual Status regular Enter LMP: 09/27/2009 Last PAP Result NEGATIVE FOR INTRAEPITHELIAL LESIONS OR MALIGNANCY.   CC:  Pt states she has a bite mark on her arm and from something bit her.  Pt also has a small knot on the left side oh her neck right below the jaw..  History of Present Illness: Patient arrived late.  Waited until end of day to be seen.   Wanted me to check a rash on her right forearm.  She says she went to the ED for it 2-3 weeks ago.  She thought she was bitten by something.  She was given an antibx but never got it filled.  It is pruritic.  No pain.  No fever or chills.  It started out smaller and has gotten bigger.    She also says that she is seeing an ENT tomorrow for swelling in her left neck.  It has been there for several weeks.  It is tender at times.  She is a smoker.  No sore throat or trouble swallowing.  As I am leaving the room, she states that she is going to get some antibiotics filled from when she went to the ED and had "vaginitis."  I reviewed her hospital records.  She was seen in Dec. for bacterial vaginosis.  She was given clindamycin b/c she cannot tolerate flagyl.  She then went back in Jan. to the ED for a hurt toe.  At that time, she noted her  symptoms again and described dysuria.  Her u/a was + for UTI and she was given doxy and her clindamycin was refilled b/c she never got it in Dec.  She now denies any abdominal pain.   She denies dysuria.  She still has vaginal discharge and odor.  On the way out of the room, she is requesting a referral to infectious disease clinic in [facility] for Herpes.  She then wants to make an appointment for a colonoscopy.  She has been asked to schedule another appointment in the near future to take care of her multiple other concerns.  Problems Prior to Update: 1)  Hoarseness, Chronic  (ICD-784.49) 2)  Unspecified Breast Screening  (ICD-V76.10) 3)  Screening For Malignant Neoplasm, Cervix  (ICD-V76.2) 4)  Examination, Routine Medical  (ICD-V70.0) 5)  Vaginitis, Bacterial  (ICD-616.10) 6)  Bipolar Affective Disorder, Hx of  (ICD-V11.8) 7)  Tinea Pedis  (ICD-110.4)  Allergies (verified): No Known Drug Allergies  Past History:  Past Medical History: Last updated: 05/04/2008 Perimenopause h/o palpitations...negative cardiac w/u per Dr.Wall 2/09.  Review of Systems  The patient denies fever and unusual weight change.         denies night sweats   Physical Exam  General:  alert, well-developed, and well-nourished.   Head:  normocephalic and atraumatic.   Neck:  supple.   left salivary gland palpable and somewhat tender with palp Neurologic:  alert & oriented X3 and cranial nerves II-XII intact.   Skin:  annular plaque with excoriation and some lichenification noted scraping reveals what appears to be some hyphae Psych:  normally interactive.     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 Apply to foot rash two times a day x 3 weeks 2)  Lotrisone 1-0.05 % Crea (Clotrimazole-betamethasone) .... Apply to rash on arm two times a day for 2 weeks  Patient Instructions: 1)  Apply cream to rash two times a day for 2 weeks.  If no improvement, arrange follow up appointment. 2)  You only need to get the Clindamycin filled for vaginitis.  You do not need any other medicines. Prescriptions: LOTRISONE 1-0.05 % CREA (CLOTRIMAZOLE-BETAMETHASONE) apply to rash on arm two times a day for 2 weeks  #30 grams x 1   Entered and Authorized by:   Tereso Newcomer PA-C   Signed by:   Tereso Newcomer PA-C on 10/04/2009   Method used:   Print then Give to Patient   RxID:   [id number]   Laboratory Results    [facility] Source: vaginal WBC/hpf: 1-5 Bacteria/hpf: rare Clue cells/hpf: few  Negative whiff Yeast/hpf: none Wet [place]OH: Negative Trichomonas/hpf: none

## 2010-09-19 NOTE — Assessment & Plan Note (Signed)
Summary: Complete Physical Exam   Vital Signs:  Patient profile:   50 year old female Menstrual status:  regular LMP:     10/2009 Weight:      149.5 pounds BMI:     24.59 BSA:     1.76 Temp:     97.5 degrees F oral Pulse rate:   82 / minute Pulse rhythm:   regular Resp:     20 per minute BP sitting:   107 / 70  (left arm) Cuff size:   regular  Vitals Entered By: Levon Hedger (Dec 22, 2009 3:24 PM) CC: CPP Is Patient Diabetic? No Pain Assessment Patient in pain? yes     Location: stomach, rectum  Does patient need assistance? Functional Status Self care Ambulation Normal  Vision Screening:      Vision Comments: 11/2010 LMP (date): 10/2009     Enter LMP: 10/2009 Last PAP Result NEGATIVE FOR INTRAEPITHELIAL LESIONS OR MALIGNANCY.   CC:  CPP.  History of Present Illness:  Pt into the office for a complete physical exam  PAP - last PAP was one year ago She has had a hx of abnormal PAP in 1986 for which she had a procedure at [facility]. All PAPs since that time have been normal. Menses - irregular; night sweats nightly and hot flashes very infrequent  Mammogram - Last mammogram was over one year ago Family history of breast cancer- maternal great aunt Self breast exams home but at no particular time of the month  Optho - Last eye exam was last month She will get her glasses in a few weeks  Dentist - maintains recent dental exams  Colonscopy - no hx of colonscopy no family hx of colon cancer no blood in stools at this time Admits to some constipation   Diabetes Management History:      She says that she is exercising.  Type of exercise includes: walking.  She is doing this 7 times per week.    Allergies (verified): No Known Drug Allergies  Review of Systems General:  Complains of sweats; night. Eyes:  Denies blurring. ENT:  Denies earache. CV:  Denies chest pain or discomfort. Resp:  Denies cough. GI:  Denies abdominal pain, nausea, and  vomiting. GU:  Denies discharge. MS:  Denies joint pain. Derm:  Denies rash. Neuro:  Denies headaches. Psych:  Denies anxiety and depression.  Physical Exam  General:  alert.   Head:  normocephalic.   Eyes:  vision grossly intact, pupils equal, and pupils round.   Ears:  bil TM with bony landmarks present Nose:  no nasal discharge.   Mouth:  discoloration tongue - white plaque on tongue Neck:  supple.   Chest Wall:  no mass.   Breasts:  no masses.   Lungs:  normal breath sounds.   Heart:  normal rate and regular rhythm.   Abdomen:  soft, non-tender, and normal bowel sounds.   Rectal:  no external abnormalities.   Msk:  up to the exam table Pulses:  R radial normal and L radial normal.   Extremities:  no edema Neurologic:  alert & oriented X3.   Skin:  no rashes.   Psych:  Oriented X3.    Pelvic Exam  Vulva:      normal appearance.   Urethra and Bladder:      Urethra--normal.   Vagina:      physiologic discharge.   Cervix:      left deviation Uterus:  smooth.   Adnexa:      nontender bilaterally.   Rectum:      normal, heme negative stool.    Diabetes Management Exam:    Eye Exam:       Eye Exam done elsewhere          Date: 11/18/2009          Results: normal          Done by: Optho    Impression & Recommendations:  Problem # 1:  ROUTINE GYNECOLOGICAL EXAMINATION (ICD-V72.31) PAP done labs done except lipids - pt is not fasting EKG done guaiac negative Orders: UA Dipstick w/o Micro (manual) ([address]) KOH/ WET [place] [phone]) Pap Smear, Thin Prep ( Collection of) [phone]) T- GC Chlamydia ([address]78) T-TSH [phone]) T-Syphilis Test (RPR) ([address]-96295) Rapid HIV  (284[address]) T-CBC w/Diff (24401-02725) T-Comprehensive Metabolic Panel (36644-03474) EKG w/ Interpretation ([address]) Hemoccult Guaiac-1 spec.(in office) ([address]70)  Problem # 2:  UNSPECIFIED BREAST SCREENING (ICD-V76.10) pt has missed a previously scheduled appt from mammogram she would like  to reschedule it self breast exam placcard given to pt Orders: Mammogram (Screening) (Mammo)  Problem # 3:  TOBACCO ABUSE (ICD-305.1) advised cessation  Problem # 4:  CANDIDIASIS, ORAL (ICD-112.0) dx reviewed with pt will treat with nystatin  Problem # 5:  SCREENING, COLON CANCER (ICD-V76.51) indications given to pt Orders: Colonoscopy (Colon)  Complete Medication List: 1)  Econazole Nitrate 1 % Crea (Econazole nitrate) .... Apply to foot rash two times a day x 3 weeks 2)  Lotrisone 1-0.05 % Crea (Clotrimazole-betamethasone) .... Apply to rash on arm two times a day for 2 weeks 3)  Nystatin 100000 Unit/ml Susp (Nystatin) .... One teaspoon four times per day swish and spit  Other Orders: T-Culture, Urine ([id number])  Patient Instructions: 1)  Schedule a fasting appointment for labs - lipids 2)  Do not eat after midnight before this visit 3)  You will be scheduled for a mammogram - be sure to keep this appointment 4)  You will be notified of the referral for GI for colonscopy Prescriptions: NYSTATIN 100000 UNIT/ML SUSP (NYSTATIN) One teaspoon four times per day swish and spit  #275ml x 0   Entered and Authorized by:   Lehman Prom FNP   Signed by:   Lehman Prom FNP on 12/23/2009   Method used:   Electronically to        [facility] [phone]* (retail)       [address]       [place], [place]  [address]       Ph: [phone]       Fax: [phone]   RxID:   [id number] NYSTATIN 100000 UNIT/ML SUSP (NYSTATIN) One teaspoon four times per day swish and spit  #270ml x 0   Entered and Authorized by:   Lehman Prom FNP   Signed by:   Levon Hedger on 12/22/2009   Method used:   Print then Give to Patient   RxID:   [id number]   Laboratory Results   Urine Tests  Date/Time Received: Dec 22, 2009 3:47 PM   Routine Urinalysis   Color: lt. yellow Appearance: Clear Glucose: negative   (Normal Range: Negative) Bilirubin: negative   (Normal  Range: Negative) Ketone: trace (5)   (Normal Range: Negative) Spec. Gravity: 1.020   (Normal Range: 1.003-1.035) Blood: trace-lysed   (Normal Range: Negative) pH: 7.5   (Normal Range: 5.0-8.0) Protein: trace   (Normal Range: Negative) Urobilinogen: 0.2   (Normal Range:  0-1) Nitrite: negative   (Normal Range: Negative) Leukocyte Esterace: negative   (Normal Range: Negative)    Date/Time Received: Dec 22, 2009 4:43 PM   Other Tests  Rapid HIV: negative Comments: no inhouse wet prep done    EKG  Procedure date:  12/22/2009  Findings:      NSR   Laboratory Results   Urine Tests    Routine Urinalysis   Color: lt. yellow Appearance: Clear Glucose: negative   (Normal Range: Negative) Bilirubin: negative   (Normal Range: Negative) Ketone: trace (5)   (Normal Range: Negative) Spec. Gravity: 1.020   (Normal Range: 1.003-1.035) Blood: trace-lysed   (Normal Range: Negative) pH: 7.5   (Normal Range: 5.0-8.0) Protein: trace   (Normal Range: Negative) Urobilinogen: 0.2   (Normal Range: 0-1) Nitrite: negative   (Normal Range: Negative) Leukocyte Esterace: negative   (Normal Range: Negative)      Other Tests  Rapid HIV: negative Comments: no inhouse wet prep done    Prevention & Chronic Care Immunizations   Influenza vaccine: Not documented    Tetanus booster: 10/18/2005: Historical    Pneumococcal vaccine: Not documented  Colorectal Screening   Hemoccult: Not documented   Hemoccult action/deferral: Ordered  (12/22/2009)   Hemoccult due: 12/23/2010    Colonoscopy: Not documented   Colonoscopy action/deferral: GI referral  (12/22/2009)  Other Screening   Pap smear: NEGATIVE FOR INTRAEPITHELIAL LESIONS OR MALIGNANCY.  (05/04/2008)   Pap smear action/deferral: Ordered  (12/22/2009)   Pap smear due: 12/23/2010    Mammogram: Not documented   Mammogram action/deferral: Ordered  (12/22/2009)   Smoking status: current  (05/04/2008)  Lipids   Total  Cholesterol: Not documented   Lipid panel action/deferral: Deferred   LDL: Not documented   LDL Direct: Not documented   HDL: Not documented   Triglycerides: Not documented

## 2010-09-19 NOTE — Letter (Signed)
Summary: *HSN Results Follow up  [facility]  [address] [place], [place] [address]   Phone: [phone]  Fax: [phone]      03/07/2010   JOVANNA HODGES [place] [address] [place], [place]  [address]   Dear  Ms. [name] [place],                            ____S.Drinkard,FNP   ____D. Gore,FNP       ____B. McPherson,MD   ____V. Rankins,MD    ____E. Mulberry,MD    ____N. Daphine Deutscher, FNP  ____D. Reche Dixon, MD    ____K. Philipp Deputy, MD    ____Other     This letter is to inform you that your recent test(s):  _______Pap Smear    __X_____Lab Test     _______X-ray    _______ is within acceptable limits  ___X____ requires a medication change  _______ requires a follow-up lab visit  _______ requires a follow-up visit with your provider   Comments:  We have been trying to reach you.  Please give the office a call at your earliest convenience.       _________________________________________________________ If you have any questions, please contact our office                     Sincerely,  [name] [facility]

## 2010-09-19 NOTE — Assessment & Plan Note (Signed)
Summary: ABD AND RECTAL PAIN//HX CONSTIPATION...AS.   History of Present Illness Visit Type: consult Primary GI MD: Stan Head MD [facility] Primary Provider: Lehman Prom, FNP  Requesting Provider: Lehman Prom, FNP  Chief Complaint: constipation History of Present Illness:   50 yo African-American woman with rectal pain, a pulling pain in the rectum . It  has been a problem for 6 months. She has been constipated some and then loose in an alernating pattern. This has been chronic, perhaps a bit worse. She has had large and hard stools years ago with painful rectal bleeding and had an evaluation at [facility] with what sounds like a rigid proctoscope. she has concern that it may be related to rough anal intercourse years ago.   GI Review of Systems    Reports abdominal pain, acid reflux, bloating, chest pain, and  dysphagia with solids.     Location of  Abdominal pain: lower abdomen.    Denies belching, dysphagia with liquids, heartburn, loss of appetite, nausea, vomiting, vomiting blood, weight loss, and  weight gain.      Reports change in bowel habits, constipation, hemorrhoids, and  rectal pain.     Denies anal fissure, black tarry stools, diarrhea, diverticulosis, fecal incontinence, heme positive stool, irritable bowel syndrome, jaundice, light color stool, liver problems, and  rectal bleeding. Preventive Screening-Counseling & Management  Alcohol-Tobacco     Smoking Status: current     Smoking Cessation Counseling: yes    Current Medications (verified): 1)  Econazole Nitrate 1 % Crea (Econazole Nitrate) .... Apply To Foot Rash Two Times A Day X 3 Weeks 2)  Lotrisone 1-0.05 % Crea (Clotrimazole-Betamethasone) .... Apply To Rash On Arm Two Times A Day For 2 Weeks 3)  Nystatin 100000 Unit/ml Susp (Nystatin) .... One Teaspoon Four Times Per Day Swish and Spit  Allergies (verified): No Known Drug Allergies  Past History:  Past Medical History: Perimenopause h/o  palpitations...negative cardiac w/u per Dr.Wall 2/09. Anemia Headaches, reported as migraines Depression - Bipolar  Past Surgical History: Reviewed history from 05/04/2008 and no changes required. 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         All other ROS negative except as per HPI.   Vital Signs:  Patient profile:   50 year old female Menstrual status:  regular Height:      65.5 inches Weight:      146 pounds BMI:     24.01 BSA:     1.74 Pulse rate:   88 / minute Pulse rhythm:   regular BP sitting:   100 / 68  (left arm) Cuff size:   regular  Vitals Entered By: Ok Anis CMA (February 13, 2010 10:44 AM)  Physical Exam  Eyes:  PERRLA, no icterus. Mouth:  No deformity or lesions in oral or posterior pharynxl. Neck:  Supple; no masses or  thyromegaly. Lungs:  Clear throughout to auscultation. Heart:  Regular rate and rhythm; no murmurs, rubs,  or bruits. Abdomen:  soft, mildly sensitive to palpation without hernia, mass, HSM Rectal:  deferred until time of colonoscopy.   Extremities:  no edema Neurologic:  Alert and  oriented x3 Cervical Nodes:  No significant cervical or supraclavicular adenopathy. salivary gland palpable on left submandibular area Psych:  Alert and cooperative.  Normal mood and affect.   Impression & Recommendations:  Problem # 1:  CHANGE IN BOWELS (EAV-409.81) Assessment New  She probably has IBS but is 48 and has not had a colonoscopy so will investigate with colonoscopy. Risks, benefits,and indications of endoscopic procedure(s) were reviewed with the patient and all questions answered.  Orders: Colonoscopy (Colon)  Problem # 2:  RECTAL PAIN (XBJ-478.29) Assessment: New  She ?ed if due to anal intercourse 12 years ago. Do not think so. ? IBS issue or another pelvic process. Await colonoscopy.  Orders: Colonoscopy (Colon)  Problem # 3:  ANEMIA NOS (ICD-285.9)  New to me: mild normocytic with borderline B12 and ferritin iron constipated her will give single B12 injection today will need follow-up of this through PCP otherwise.  Orders: Vit B12 1000 mcg (F6213)  Patient Instructions: 1)  Please pick up your medications at your pharmacy. MOVIPREP 2)  [facility] Patient Information Guide given to patient.  3)  Colonoscopy and Flexible Sigmoidoscopy brochure given.  4)  Copy sent to : Lehman Prom, FNP  5)  The medication list was reviewed and reconciled.  All changed / newly prescribed medications were explained.  A complete medication list was provided to the patient / caregiver. Prescriptions: MOVIPREP 100 GM  SOLR (PEG-KCL-NACL-NASULF-NA ASC-C) As per prep instructions.  #1 x 0   Entered by:   Francee Piccolo CMA (AAMA)   Authorized by:   Iva Boop MD, [facility]   Signed by:   Francee Piccolo CMA (AAMA) on 02/13/2010   Method used:   Electronically to        [facility] [phone]* (retail)       [address]       [place], [place]  [address]       Ph: [phone]       Fax: [phone]   RxID:   [id number]     Medication Administration  Injection # 1:    Medication: Vit B12 1000 mcg    Diagnosis: ANEMIA NOS (ICD-285.9)    Route: IM    Site: R deltoid    Exp Date: 09/2011     Lot #: [id number]    Mfr: [employer]ent    Patient tolerated injection without complications    Given by: Francee Piccolo CMA Duncan Dull) (February 13, 2010 11:50 AM)  Orders Added: 1)  Vit B12 1000 mcg [J3420] 2)  Colonoscopy [Colon]

## 2010-09-19 NOTE — Letter (Signed)
Summary: [facility],,ENT  [facility],,ENT   Imported By: Arta Bruce 12/13/2009 15:40:51  _____________________________________________________________________  External Attachment:    Type:   Image     Comment:   External Document

## 2010-09-19 NOTE — Letter (Signed)
Summary: Lipid Letter  [facility]  [address] [place], [place] [address]   Phone: [phone]  Fax: [phone]    01/05/2010  Nicole [name] [address] [place], [place]  [address]  Dear [name]:  Here are your cholesterol results:    Cholesterol:       200     Goal: <200   HDL "good" Cholesterol:   64     Goal: >40   LDL "bad" Cholesterol:   123     Goal: <160   Triglycerides:       66     Goal: <150    As you can see, you are at goal with everything.  Keep doing what you are.    Current Medications: 1)    Econazole Nitrate 1 % Crea (Econazole nitrate) .... Apply to foot rash two times a day x 3 weeks 2)    Lotrisone 1-0.05 % Crea (Clotrimazole-betamethasone) .... Apply to rash on arm two times a day for 2 weeks 3)    Nystatin 100000 Unit/ml Susp (Nystatin) .... One teaspoon four times per day swish and spit  If you have any questions, please call. We appreciate being able to work with you.   Sincerely,    [facility] Tereso Newcomer PA-C

## 2010-09-19 NOTE — Letter (Signed)
Summary: TEST ORDER FORM//MAMOGRAM//APPT DATE & TIME  TEST ORDER FORM//MAMOGRAM//APPT DATE & TIME   Imported By: Arta Bruce 12/23/2009 09:40:53  _____________________________________________________________________  External Attachment:    Type:   Image     Comment:   External Document

## 2010-09-19 NOTE — Assessment & Plan Note (Signed)
Summary: Acute - Urinary Problems   Vital Signs:  Patient profile:   50 year old female Menstrual status:  regular Height:      65.5 inches Weight:      148 pounds Temp:     97.9 degrees F oral Pulse rate:   72 / minute Pulse rhythm:   regular Resp:     12 per minute BP sitting:   118 / 68  (left arm) Cuff size:   regular  Vitals Entered By: Michelle Nasuti (May 01, 2010 10:40 AM) CC: review lab results  Does patient need assistance? Functional Status Self care Ambulation Normal   Primary Care Hilarie Sinha:  Lehman Prom, FNP   CC:  review lab results.  History of Present Illness:  Pt into the office for f/u on urinary complaints from last week. She presented last week and had u/a and culture sent to the lab. She reports that she went to the ER over the weekend for vague "weakness" complaints. U/a repeated in the ER.  All normal results.  No treatment started  Only complaint pt has at this time is low back pain +urinary odor -dysuria -hematuria -fever Pt admits that she did not take macrobid as previously ordered for cystitis.  She started 1 month after it was ordered and then missed several doses  "I think someone is doing [employer] officer on me" I'm serious  Habits & Providers  Alcohol-Tobacco-Diet     Tobacco Status: current     Cigarette Packs/Day: <0.25  Exercise-Depression-Behavior     Does Patient Exercise: no  Allergies (verified): No Known Drug Allergies  Social History: Does Patient Exercise:  no  Review of Systems General:  Denies fever. CV:  Denies chest pain or discomfort. Resp:  Denies cough. GI:  Denies abdominal pain, nausea, and vomiting. MS:  Complains of low back pain.  Physical Exam  General:  alert.   Head:  normocephalic.   Lungs:  normal breath sounds.   Heart:  normal rate and regular rhythm.   Abdomen:  soft, non-tender, and normal bowel sounds.   Msk:  up to the exam table Neurologic:  alert & oriented X3.   Skin:  no  rashes.   Psych:  Oriented X3.     Impression & Recommendations:  Problem # 1:  ACUTE CYSTITIS (ICD-595.0) culture done on last week still with bacteria present pt admitted that she did not take antiboitics as ordered during previous regimen The following medications were removed from the medication list:    Phenazopyridine Hcl 200 Mg Tabs (Phenazopyridine hcl) ..... One tablet by mouth three times a day for bladder Her updated medication list for this problem includes:    Ciprofloxacin Hcl 500 Mg Tabs (Ciprofloxacin hcl) ..... One tablet by mouth two times a day x 3 days  Problem # 2:  TOBACCO ABUSE (ICD-305.1) continue to advise cessation  Complete Medication List: 1)  Lotrisone 1-0.05 % Crea (Clotrimazole-betamethasone) .... Apply to rash on arm two times a day for 2 weeks 2)  Ciprofloxacin Hcl 500 Mg Tabs (Ciprofloxacin hcl) .... One tablet by mouth two times a day x 3 days  Patient Instructions: 1)  Be sure to take antibiotics as ordered. 2)  Take twice daily for 3 days 3)  No dairy products 2 hours before or after this medications 4)  Follow up as needed  Rx faxed to [facility] [address] Prescriptions: CIPROFLOXACIN HCL 500 MG TABS (CIPROFLOXACIN HCL) One tablet by mouth two times a day x  3 days  #6 x 0   Entered and Authorized by:   Lehman Prom FNP   Signed by:   Lehman Prom FNP on 05/01/2010   Method used:   Electronically to        [facility] [phone]* (retail)       [address]       [place], [place]  [address]       Ph: [phone]       Fax: [phone]   RxID:   [id number] CIPROFLOXACIN HCL 500 MG TABS (CIPROFLOXACIN HCL) One tablet by mouth two times a day x 3 days  #6 x 0   Entered and Authorized by:   Lehman Prom FNP   Signed by:   Lehman Prom FNP on 05/01/2010   Method used:   Print then Give to Patient   RxID:   [id number]

## 2010-09-19 NOTE — Letter (Signed)
Summary: [facility]/PT ACCOUNTS REQUIRING CORRRECTED INFORMATION  [facility]/PT ACCOUNTS REQUIRING CORRRECTED INFORMATION   Imported By: Arta Bruce 02/28/2010 09:25:10  _____________________________________________________________________  External Attachment:    Type:   Image     Comment:   External Document

## 2010-09-19 NOTE — Assessment & Plan Note (Signed)
Summary: U/A to check for UTI // tl  Nurse Visit   Vital Signs:  Patient profile:   50 year old female Menstrual status:  regular Temp:     97.4 degrees F Pulse rate:   88 / minute Pulse rhythm:   regular Resp:     20 per minute BP sitting:   100 / 58  (right arm)  Vitals Entered By: Dutch Quint RN (April 26, 2010 10:25 AM) Pain Assessment Patient in pain? yes     Location: suprapubic and lower back Intensity: 8 Type: dull Onset of pain  Constant  Does patient need assistance? Functional Status Self care Ambulation Normal   Primary Care Provider:  Lehman Prom, FNP    History of Present Illness: Having problems with bladder and kidneys.  Had a bladder infection about two months ago.  States she was prescribed medication, but did not take as ordered and there was a month and 1/2 gap between the diagnosis and her getting the med.  Having pain in her lower back and in her lower part of her abdomen.  Denies actual pain when voiding,  but is having pressure.  Urine smells "old".  Urine changes color, no specific.  Denies hematuria.  Having urgency and frequency. States her stomach is bloated.  Not sure if she's running a fever, but has been having chills.     Review of Systems General:  Complains of malaise. GU:  Complains of discharge, nocturia, and urinary frequency; denies abnormal vaginal bleeding, dysuria, and incontinence; States a little discharge which is normal for her, having urinary urgency..   Physical Exam  General:  alert, well-developed, well-nourished, and well-hydrated.     Complete Medication List: 1)  Lotrisone 1-0.05 % Crea (Clotrimazole-betamethasone) .... Apply to rash on arm two times a day for 2 weeks 2)  Phenazopyridine Hcl 200 Mg Tabs (Phenazopyridine hcl) .... One tablet by mouth three times a day for bladder  Other Orders: UA Dipstick w/o Micro (automated)  ([address]1003) T-Culture, Urine ([id number])   Patient Instructions: 1)   Reviewed with Wende Mott 2)  Avoid spicy foods and caffeine 3)  Drink plenty of water, 6-8 glasses a day, as well as cranberry juice. 4)  Urine is going to be sent for a culture, we will notify you of the results.   Allergies: No Known Drug Allergies Laboratory Results   Urine Tests  Date/Time Received: April 26, 2010 10:40 AM   Routine Urinalysis   Color: yellow Glucose: negative   (Normal Range: Negative) Bilirubin: negative   (Normal Range: Negative) Ketone: trace (5)   (Normal Range: Negative) Spec. Gravity: >=1.030   (Normal Range: 1.003-1.035) Blood: trace-intact   (Normal Range: Negative) pH: 5.5   (Normal Range: 5.0-8.0) Protein: negative   (Normal Range: Negative) Urobilinogen: 0.2   (Normal Range: 0-1) Nitrite: negative   (Normal Range: Negative) Leukocyte Esterace: negative   (Normal Range: Negative)       Orders Added: 1)  Est. Patient Level I [[address]] 2)  UA Dipstick w/o Micro (automated)  [81003] 3)  T-Culture, Urine [[id number]]

## 2010-09-19 NOTE — Progress Notes (Signed)
Summary: Office Visit//DEPRESSION SCREENING  Office Visit//DEPRESSION SCREENING   Imported By: Arta Bruce 02/17/2010 14:45:39  _____________________________________________________________________  External Attachment:    Type:   Image     Comment:   External Document

## 2010-09-19 NOTE — Assessment & Plan Note (Signed)
Summary: Vaginal problems   Vital Signs:  Patient profile:   51 year old female Menstrual status:  regular LMP:     06/20/2010 Weight:      156.4 pounds BMI:     25.72 Temp:     97.1 degrees F oral Pulse rate:   60 / minute Pulse rhythm:   regular Resp:     16 per minute BP sitting:   96 / 60  (left arm) Cuff size:   regular  Vitals Entered By: Levon Hedger (June 30, 2010 12:45 PM)  Nutrition Counseling: Patient's BMI is greater than 25 and therefore counseled on weight management options. CC: vaginal infection with fishy smelll...did not get medication for bladder infection that she had... Is Patient Diabetic? No Pain Assessment Patient in pain? no       Does patient need assistance? Functional Status Self care Ambulation Normal LMP (date): 06/20/2010     Enter LMP: 06/20/2010 Last PAP Result  Specimen Adequacy: Satisfactory for evaluation.   Interpretation/Result:Negative for intraepithelial Lesion or Malignancy.      Primary Care Andrya Roppolo:  Lehman Prom, FNP   CC:  vaginal infection with fishy smelll...did not get medication for bladder infection that she had....  History of Present Illness:  Pt into the office for f/u on irregular bleeding. Pt reports that she had not seen her cycle for 6 months then it appeared the end of October. Menses was very light and not characterized as usually flow. Following menses she started with "fishy" odor Thin yellow discharge slight abdominal pain No douching used some anti-itch cream that she purchased OTC  Acute cystitis - Dx ordered during last visit in this office.  She admits that she did not get the Rx filled due to finanaces   Pt is sexually active and is in a monogomous relationship  Habits & Providers  Alcohol-Tobacco-Diet     Alcohol drinks/day: 0     Alcohol type: beer and wine     Tobacco Status: current     Tobacco Counseling: to quit use of tobacco products     Cigarette Packs/Day: <0.25     Year Started: 60's  Exercise-Depression-Behavior     Does Patient Exercise: no     Type of exercise: walking     Times/week: 7     Have you felt down or hopeless? no     Have you felt little pleasure in things? no     Depression Counseling: not indicated; screening negative for depression     Drug Use: yes     Seat Belt Use: 50     Sun Exposure: occasionally  Allergies (verified): No Known Drug Allergies  Review of Systems General:  Denies fever. CV:  Denies chest pain or discomfort. Resp:  Denies cough. GI:  Denies abdominal pain, nausea, and vomiting. GU:  Complains of abnormal vaginal bleeding and discharge; menses started at the end of october and had not been present for 6 months.  Physical Exam  General:  alert.   Head:  normocephalic.   Lungs:  normal breath sounds.   Heart:  normal rate and regular rhythm.   Abdomen:  normal bowel sounds.   Genitalia:  self wet prep   Impression & Recommendations:  Problem # 1:  VAGINITIS, BACTERIAL (ICD-616.10) dx given to pt advised her on good hygiene Orders: KOH/ WET Mount [phone])  Her updated medication list for this problem includes:    Metrogel-vaginal 0.75 % Gel (Metronidazole) ..... One application intravaginally at night  for infection  Problem # 2:  PERIMENOPAUSAL SYNDROME (ICD-627.9) advised pt to the symptoms she will need to wait 1 full year for post menopausal status  Problem # 3:  TOBACCO ABUSE (ICD-305.1) advised cessation  Complete Medication List: 1)  Metrogel-vaginal 0.75 % Gel (Metronidazole) .... One application intravaginally at night for infection  Other Orders: UA Dipstick w/o Micro (manual) (44034)  Patient Instructions: 1)  You have declined the flu vaccine today.  If you change your mind inform this office. 2)  Bacterial infection - may be due to irregular period.  3)  remember you have to go a whole year without seeing your period before you can say you are postmenopausal. 4)  Follow  up as needed Prescriptions: METROGEL-VAGINAL 0.75 % GEL (METRONIDAZOLE) One application intravaginally at night for infection  #45gm x 0   Entered and Authorized by:   Lehman Prom FNP   Signed by:   Lehman Prom FNP on 06/30/2010   Method used:   Print then Give to Patient   RxID:   [id number]    Orders Added: 1)  Est. Patient Level III [95188] 2)  KOH/ WET Mount [41660] 3)  UA Dipstick w/o Micro (manual) [81002]    Prevention & Chronic Care Immunizations   Influenza vaccine: Not documented   Influenza vaccine deferral: Deferred  (06/30/2010)    Tetanus booster: 10/18/2005: Historical    Pneumococcal vaccine: Not documented  Colorectal Screening   Hemoccult: Not documented   Hemoccult action/deferral: Ordered  (12/22/2009)   Hemoccult due: 12/23/2010    Colonoscopy: Not documented   Colonoscopy action/deferral: GI referral  (12/22/2009)  Other Screening   Pap smear:  Specimen Adequacy: Satisfactory for evaluation.   Interpretation/Result:Negative for intraepithelial Lesion or Malignancy.     (12/22/2009)   Pap smear action/deferral: Ordered  (12/22/2009)   Pap smear due: 12/2010    Mammogram: ASSESSMENT: Negative - BI-RADS 1^MM DIGITAL SCREENING  (12/27/2009)   Mammogram action/deferral: Ordered  (12/22/2009)   Smoking status: current  (06/30/2010)   Smoking cessation counseling: yes  (02/13/2010)  Lipids   Total Cholesterol: 200  (01/04/2010)   Lipid panel action/deferral: Deferred   LDL: 123  (01/04/2010)   LDL Direct: Not documented   HDL: 64  (01/04/2010)   Triglycerides: 66  (01/04/2010)   Laboratory Results   Urine Tests  Date/Time Received: June 30, 2010 1:15 PM   Routine Urinalysis   Color: lt. yellow Glucose: negative   (Normal Range: Negative) Bilirubin: negative   (Normal Range: Negative) Ketone: negative   (Normal Range: Negative) Spec. Gravity: 1.015   (Normal Range: 1.003-1.035) Blood: trace-intact   (Normal Range:  Negative) pH: 6.0   (Normal Range: 5.0-8.0) Protein: negative   (Normal Range: Negative) Urobilinogen: 0.2   (Normal Range: 0-1) Nitrite: negative   (Normal Range: Negative) Leukocyte Esterace: negative   (Normal Range: Negative)    Date/Time Received: June 30, 2010 1:17 PM   [employer] Source: vaginal WBC/hpf: 1-5 Bacteria/hpf: rare Clue cells/hpf: few Yeast/hpf: none Wet Mount KOH: Negative Trichomonas/hpf: none

## 2010-09-19 NOTE — Letter (Signed)
Summary: [facility]  [facility]   Imported By: Arta Bruce 12/19/2009 10:11:57  _____________________________________________________________________  External Attachment:    Type:   Image     Comment:   External Document

## 2010-09-19 NOTE — Miscellaneous (Signed)
Summary: ENT re-eval  Clinical Lists Changes  Problems: Added new problem of GERD (ICD-530.81) Assessed GERD as comment only - dx by ENT had recent eval for neck mass had laryngoscopy  4.15.2011 placed on prilosec        Impression & Recommendations:  Problem # 1:  GERD (ICD-530.81) dx by ENT had recent eval for neck mass had laryngoscopy  4.15.2011 placed on prilosec  Complete Medication List: 1)  Econazole Nitrate 1 % Crea (Econazole nitrate) .... Apply to foot rash two times a day x 3 weeks 2)  Lotrisone 1-0.05 % Crea (Clotrimazole-betamethasone) .... Apply to rash on arm two times a day for 2 weeks

## 2010-09-19 NOTE — Letter (Signed)
Summary: New Patient letter  [facility]  [address] [place], [place] [address]   Phone: [phone]  Fax: [phone]       12/29/2009 MRN: [id number]  Nicole [name] [address] [place], [place]  [address]  Dear Nicole [name],  Welcome to the Gastroenterology Division at [facility].    You are scheduled to see Dr. Leone Payor on 02/13/2010 at 10:30AM on the 3rd floor at [facility], [address].  We ask that you try to arrive at our office 15 minutes prior to your appointment time to allow for check-in.  We would like you to complete the enclosed self-administered evaluation form prior to your visit and bring it with you on the day of your appointment.  We will review it with you.  Also, please bring a complete list of all your medications or, if you prefer, bring the medication bottles and we will list them.  Please bring your insurance card so that we may make a copy of it.  If your insurance requires a referral to see a specialist, please bring your referral form from your primary care physician.  Co-payments are due at the time of your visit and may be paid by cash, check or credit card.     Your office visit will consist of a consult with your physician (includes a physical exam), any laboratory testing he/she may order, scheduling of any necessary diagnostic testing (e.g. x-ray, ultrasound, CT-scan), and scheduling of a procedure (e.g. Endoscopy, Colonoscopy) if required.  Please allow enough time on your schedule to allow for any/all of these possibilities.    If you cannot keep your appointment, please call [phone] to cancel or reschedule prior to your appointment date.  This allows [place] the opportunity to schedule an appointment for another patient in need of care.  If you do not cancel or reschedule by 5 p.m. the business day prior to your appointment date, you will be charged a $50.00 late cancellation/no-show fee.    Thank you for choosing [facility] for your medical needs.  We appreciate the opportunity to care for you.  Please visit [place] at our website  to learn more about our practice.                     Sincerely,                                                             The Gastroenterology Division

## 2010-09-21 NOTE — Assessment & Plan Note (Signed)
Summary: Cystitis  Nurse Visit   Vital Signs:  Patient profile:   50 year old female Menstrual status:  regular Temp:     97.1 degrees F Pulse rate:   92 / minute Pulse rhythm:   regular Resp:     16 per minute BP sitting:   94 / 60  (right arm) Cuff size:   regular  Vitals Entered By: Dutch Quint RN (July 31, 2010 3:05 PM)  Patient Instructions: 1)  Reviewed with N.Martin 2)  We'll let you know the results of the urine culture. 3)  Go ahead and take the antibiotic that you already have. 4)  Congratulations on quitting smoking! 5)  Call if anything changes.   Requesting Provider:  Lehman Prom, FNP  Primary Care Provider:  Lehman Prom, FNP    History of Present Illness: Burning, frequent urination, really painful during and after voiding.  Started a few days ago.  States had abdominal cramping badly last night.  Denies nausea, vomiting, fever.     Impression & Recommendations:  Problem # 1:  DYSURIA (ICD-788.1) Reviewed with Jesse Fall c/o persistent urinary pain, frequency Has not taken Cipro from last UTI Did not use metrogel- states lost Rx Urine culture sent-will notify pt. of results Pt. to take Cipro as previously ordered   Orders: T-Culture, Urine ([id number]) UA Dipstick w/o Micro (automated)  (81003)   Review of Systems GU:  Complains of dysuria, urinary frequency, and pelvic pain; Urine has gotten darker.  Some hesitancy, slower stream last night, then started voiding every 15 minutes..   Physical Exam  General:  normal appearance and healthy appearing.     Allergies: No Known Drug Allergies Laboratory Results   Urine Tests  Date/Time Received: July 31, 2010 3:15 PM   Routine Urinalysis   Color: yellow Glucose: negative   (Normal Range: Negative) Bilirubin: negative   (Normal Range: Negative) Ketone: negative   (Normal Range: Negative) Spec. Gravity: >=1.030   (Normal Range: 1.003-1.035) Blood: moderate   (Normal  Range: Negative) pH: 5.0   (Normal Range: 5.0-8.0) Protein: 30   (Normal Range: Negative) Urobilinogen: 0.2   (Normal Range: 0-1) Nitrite: negative   (Normal Range: Negative) Leukocyte Esterace: moderate   (Normal Range: Negative)       Orders Added: 1)  Est. Patient Level I [09811] 2)  T-Culture, Urine [[id number]] 3)  UA Dipstick w/o Micro (automated)  [81003]  Appended Document: Cystitis   Not Administered:    Influenza Vaccine not given due to: declined

## 2010-09-21 NOTE — Progress Notes (Signed)
Summary: Painful urination  Phone Note Call from Patient   Summary of Call: Burning, frequent urination, painful during and after voiding.  States voiding is really painful.  Triage visit schedule for this afternoon. Initial call taken by: Dutch Quint RN,  July 31, 2010 11:12 AM  Follow-up for Phone Call        In office for triage visit.  Dutch Quint RN  July 31, 2010 2:42 PM

## 2010-10-03 ENCOUNTER — Telehealth (INDEPENDENT_AMBULATORY_CARE_PROVIDER_SITE_OTHER): Payer: Self-pay | Admitting: Nurse Practitioner

## 2010-10-05 ENCOUNTER — Telehealth (INDEPENDENT_AMBULATORY_CARE_PROVIDER_SITE_OTHER): Payer: Self-pay | Admitting: Nurse Practitioner

## 2010-10-06 ENCOUNTER — Encounter (INDEPENDENT_AMBULATORY_CARE_PROVIDER_SITE_OTHER): Payer: Self-pay | Admitting: Internal Medicine

## 2010-10-06 ENCOUNTER — Encounter: Payer: Self-pay | Admitting: Internal Medicine

## 2010-10-06 DIAGNOSIS — J209 Acute bronchitis, unspecified: Secondary | ICD-10-CM | POA: Insufficient documentation

## 2010-10-06 DIAGNOSIS — J019 Acute sinusitis, unspecified: Secondary | ICD-10-CM | POA: Insufficient documentation

## 2010-10-11 NOTE — Progress Notes (Signed)
Summary: Cold symptoms  Phone Note Call from Patient Call back at [facility] Phone [phone]   Summary of Call: pt called to says she is congested, cough, runny nose, slight fever which started friday...Marland KitchenMarland KitchenMarland Kitchen pt says she is taking motrin since yesterday.... pt would like to know if she can get a rx for antibotic.... [facility] [address].... Initial call taken by: [name],  October 03, 2010 12:46 PM  Follow-up for Phone Call        Had quit smoking cigarettes x2 months, had several cigarettes then symptoms started. Not smoking cigarettes now, but is smoking one or two joints a day.  Coughing mostly during the day, productive of clear or yellowish, having clear liquid drainage from nose, fever has lessened.   Drinking plenty of fluids.  Doesn't have heat on -- pt. is hot.  Is taking motrin with food.  Denies nausea, vomiting, diarrhea.  Has slight headache going down to ear, slight earache with sinus pressure, having pain/pressure across upper nose, across cheeks.  No change in hearing, no redness or drainage from ears.  Hurts to cough, breathing was tight earlier, taking tussin and it loosens up.  Advised per cold protocol -- humidify home, drink plenty of fluids, take mucinex DM for congestion and Robitussin for cough.  Advised if she takes ibuprofen or alleve to take with food.   Limit exposure to other people, good handwashing.  Rest, elevate head of bed to ease breathing.  To call back if symptoms worsen or persist past several more days -- if develops severe headache, CP or SOB, to go to ED.  Follow-up by: Dutch Quint RN,  October 03, 2010 4:27 PM

## 2010-10-11 NOTE — Progress Notes (Signed)
Summary: REQUESTING AMOXICILLIN  Phone Note Call from Patient Call back at [facility] Phone [phone]   Summary of Call: Nicole PT. Nicole [name] CALLED AND SAYS THAT HER CONGESTION IS NO BETTER AND IF YOU WOULD CALL IN HER AMOXICILLIN TO [facility] ON [address]. Initial call taken by: Leodis Rains,  October 05, 2010 3:29 PM  Follow-up for Phone Call        Sent to N. Daphine Deutscher -- see phone note 10/03/10.  Dutch Quint RN  October 05, 2010 3:36 PM   Additional Follow-up for Phone Call Additional follow up Details #1::        pt needs to be seen in triage to assess symptoms Additional Follow-up by: Lehman Prom FNP,  October 05, 2010 4:20 PM    Additional Follow-up for Phone Call Additional follow up Details #2::    PATIENT IS SCHEDULED TO SEE THERESA 02/16. Follow-up by: Leodis Rains,  October 05, 2010 5:00 PM

## 2010-10-11 NOTE — Assessment & Plan Note (Signed)
Summary: Persistent cold symptoms  Nurse Visit   Vital Signs:  Patient profile:   50 year old female Menstrual status:  regular Weight:      168.2 pounds Temp:     97.5 degrees F oral Pulse rate:   100 / minute Pulse rhythm:   regular Resp:     24 per minute BP sitting:   116 / 84  (right arm) Cuff size:   regular  Vitals Entered By: Dutch Quint RN (October 06, 2010 9:27 AM)  Primary Care Provider:  Lehman Prom, FNP   CC:  Persistent cough.  History of Present Illness: Symptoms started 09/29/10.   Had quit smoking cigarettes x2 months, had several cigarettes then symptoms started. Not smoking cigarettes now, but is smoking one or two joints a day.  Fever has lessened.   Drinking plenty of fluids.  Doesn't have heat on -- pt. is hot.  Is taking NSAIDS with food.       Review of Systems       Denies nausea, vomiting, diarrhea.   Has slight headache going down to ear, slight earache with sinus pressure, was having pain/pressure across upper nose, across cheeks until she started taking Advil congestion medication.  No change in hearing, no redness or drainage from ears.  Hurts to cough, breathing was tight earlier, taking tussin and it loosens up.  Coughing making her dizzy.   Resp:  Complains of cough, shortness of breath, and sputum productive; Cough productive of thick yellowish mucus.  Clear nasal drainage.  Gets SOB when coughing,  Cough isn't keeping her up at night, coughing mostly during the day.Marland Kitchen   Physical Exam  Ears:  TMs appear normal bilaterally--no inflammation, possibly a slight scar on right Lungs:  Expiratory wheezes in all fields with scattered crackles.normal respiratory effort, no dullness   Impression & Recommendations:  Problem # 1:  SINUSITIS, ACUTE (ICD-461.9)  Her updated medication list for this problem includes:    Azithromycin 250 Mg Tabs (Azithromycin) .Marland Kitchen... 2 tabs by mouth today, then 1 tab by mouth daily for 4 more days  Problem # 2:   BRONCHITIS, ACUTE WITH MILD BRONCHOSPASM (ICD-466.0)  Much improved with Albuterol neb--wheezing only at bases and no crackles Her updated medication list for this problem includes:    Azithromycin 250 Mg Tabs (Azithromycin) .Marland Kitchen... 2 tabs by mouth today, then 1 tab by mouth daily for 4 more days    Ventolin Hfa 108 (90 Base) Mcg/act Aers (Albuterol sulfate) .Marland Kitchen... 2 puffs every 4 hours as needed  Orders: Nebulizer Tx (16109) Albuterol Sulfate Sol 1mg  unit dose (U0454)  Complete Medication List: 1)  Metrogel-vaginal 0.75 % Gel (Metronidazole) .... One application intravaginally at night for infection 2)  Azithromycin 250 Mg Tabs (Azithromycin) .... 2 tabs by mouth today, then 1 tab by mouth daily for 4 more days 3)  Ventolin Hfa 108 (90 Base) Mcg/act Aers (Albuterol sulfate) .... 2 puffs every 4 hours as needed   Patient Instructions: 1)  Call if no better by Monday--go to ED if worsen over weekend 2)  Push fluids  CC: Persistent cough Is Patient Diabetic? No Pain Assessment Patient in pain? no       Does patient need assistance? Functional Status Self care Ambulation Normal   Allergies: No Known Drug Allergies  Medication Administration  Medication # 1:    Medication: Albuterol Sulfate Sol 1mg  unit dose    Diagnosis: BRONCHITIS, ACUTE WITH MILD BRONCHOSPASM (ICD-466.0)    Dose: 2.5 mg.  Route: inhaled    Mfr: Nephron    Patient tolerated medication without complications    Given by: Dutch Quint RN (October 06, 2010 2:05 PM)  Orders Added: 1)  Est. Patient Level III [99213] 2)  Nebulizer Tx [phone] 3)  Albuterol Sulfate Sol 1mg  unit dose [U0454]  Prescriptions: VENTOLIN HFA 108 (90 BASE) MCG/ACT AERS (ALBUTEROL SULFATE) 2 puffs every 4 hours as needed  #1 x 0   Entered by:   Dutch Quint RN   Authorized by:   Julieanne Manson MD   Signed by:   Dutch Quint RN on 10/06/2010   Method used:   Electronically to        [facility] [phone]*  (retail)       [address]       [place], [place]  [address]       Ph: [phone]       Fax: [phone]   RxID:   [id number] AZITHROMYCIN 250 MG TABS (AZITHROMYCIN) 2 tabs by mouth today, then 1 tab by mouth daily for 4 more days  #6 tabs x 0   Entered by:   Dutch Quint RN   Authorized by:   Julieanne Manson MD   Signed by:   Dutch Quint RN on 10/06/2010   Method used:   Electronically to        [facility] [phone]* (retail)       [address]       [place], [place]  [address]       Ph: [phone]       Fax: [phone]   RxID:   [id number]

## 2010-10-30 LAB — BASIC METABOLIC PANEL
BUN: 10 mg/dL (ref 6–23)
Chloride: 108 mEq/L (ref 96–112)
Potassium: 3.4 mEq/L — ABNORMAL LOW (ref 3.5–5.1)

## 2010-10-30 LAB — DIFFERENTIAL
Basophils Absolute: 0 10*3/uL (ref 0.0–0.1)
Eosinophils Relative: 6 % — ABNORMAL HIGH (ref 0–5)
Lymphocytes Relative: 49 % — ABNORMAL HIGH (ref 12–46)
Lymphs Abs: 2.5 10*3/uL (ref 0.7–4.0)
Monocytes Absolute: 0.4 10*3/uL (ref 0.1–1.0)

## 2010-10-30 LAB — POCT CARDIAC MARKERS: Troponin i, poc: <0.05 ng/mL (ref 0.00–0.09)

## 2010-10-30 LAB — CBC
HCT: 35.8 % — ABNORMAL LOW (ref 36.0–46.0)
MCV: 93 fL (ref 78.0–100.0)
RDW: 13.3 % (ref 11.5–15.5)
WBC: 5 10*3/uL (ref 4.0–10.5)

## 2010-10-31 LAB — CBC
HCT: 35.4 % — ABNORMAL LOW (ref 36.0–46.0)
MCV: 91.9 fL (ref 78.0–100.0)
RBC: 3.85 MIL/uL — ABNORMAL LOW (ref 3.87–5.11)
WBC: 5.8 10*3/uL (ref 4.0–10.5)

## 2010-10-31 LAB — DIFFERENTIAL
Eosinophils Relative: 6 % — ABNORMAL HIGH (ref 0–5)
Lymphocytes Relative: 57 % — ABNORMAL HIGH (ref 12–46)
Lymphs Abs: 3.3 10*3/uL (ref 0.7–4.0)
Monocytes Absolute: 0.5 10*3/uL (ref 0.1–1.0)

## 2010-10-31 LAB — BASIC METABOLIC PANEL
Chloride: 110 mEq/L (ref 96–112)
GFR calc Af Amer: >60 mL/min (ref >60)
Potassium: 3.8 mEq/L (ref 3.5–5.1)
Sodium: 139 mEq/L (ref 135–145)

## 2010-11-02 LAB — URINALYSIS, ROUTINE W REFLEX MICROSCOPIC
Bilirubin Urine: NEGATIVE
Glucose, UA: NEGATIVE mg/dL
Ketones, ur: NEGATIVE mg/dL
Specific Gravity, Urine: 1.028 (ref 1.005–1.030)
pH: 6 (ref 5.0–8.0)

## 2010-11-02 LAB — GLUCOSE, CAPILLARY: Glucose-Capillary: 104 mg/dL — ABNORMAL HIGH (ref 70–99)

## 2010-11-05 LAB — URINE CULTURE: Colony Count: >=100000

## 2010-11-05 LAB — URINALYSIS, ROUTINE W REFLEX MICROSCOPIC
Bilirubin Urine: NEGATIVE
Glucose, UA: NEGATIVE mg/dL
Ketones, ur: NEGATIVE mg/dL
Nitrite: POSITIVE — AB
Protein, ur: NEGATIVE mg/dL
Specific Gravity, Urine: 1.02 (ref 1.005–1.030)
Urobilinogen, UA: 0.2 mg/dL (ref 0.0–1.0)
pH: 6.5 (ref 5.0–8.0)

## 2010-11-05 LAB — URINE MICROSCOPIC-ADD ON

## 2010-11-05 LAB — PREGNANCY, URINE: Preg Test, Ur: NEGATIVE

## 2010-11-21 LAB — GLUCOSE, CAPILLARY: Glucose-Capillary: 92 mg/dL (ref 70–99)

## 2010-11-21 LAB — URINALYSIS, ROUTINE W REFLEX MICROSCOPIC
Bilirubin Urine: NEGATIVE
Nitrite: NEGATIVE
Specific Gravity, Urine: 1.018 (ref 1.005–1.030)
Urobilinogen, UA: 0.2 mg/dL (ref 0.0–1.0)
pH: 6.5 (ref 5.0–8.0)

## 2010-11-21 LAB — DIFFERENTIAL
Eosinophils Absolute: 0.2 10*3/uL (ref 0.0–0.7)
Eosinophils Relative: 5 % (ref 0–5)
Lymphocytes Relative: 40 % (ref 12–46)
Lymphs Abs: 1.4 10*3/uL (ref 0.7–4.0)
Monocytes Relative: 6 % (ref 3–12)

## 2010-11-21 LAB — GC/CHLAMYDIA PROBE AMP, GENITAL
Chlamydia, DNA Probe: NEGATIVE
GC Probe Amp, Genital: NEGATIVE

## 2010-11-21 LAB — CBC
HCT: 35.8 % — ABNORMAL LOW (ref 36.0–46.0)
MCV: 96.4 fL (ref 78.0–100.0)
RBC: 3.71 MIL/uL — ABNORMAL LOW (ref 3.87–5.11)
WBC: 3.4 10*3/uL — ABNORMAL LOW (ref 4.0–10.5)

## 2010-11-21 LAB — WET PREP, GENITAL: Trich, Wet Prep: NONE SEEN

## 2010-11-21 LAB — URINE MICROSCOPIC-ADD ON

## 2010-11-21 LAB — BASIC METABOLIC PANEL
Chloride: 102 mEq/L (ref 96–112)
GFR calc Af Amer: >60 mL/min (ref >60)
GFR calc non Af Amer: >60 mL/min (ref >60)
Potassium: 4.1 mEq/L (ref 3.5–5.1)
Sodium: 136 mEq/L (ref 135–145)

## 2010-11-21 LAB — PREGNANCY, URINE: Preg Test, Ur: NEGATIVE

## 2010-11-26 LAB — URINE MICROSCOPIC-ADD ON

## 2010-11-26 LAB — URINALYSIS, ROUTINE W REFLEX MICROSCOPIC
Bilirubin Urine: NEGATIVE
Glucose, UA: NEGATIVE mg/dL
Ketones, ur: NEGATIVE mg/dL
Nitrite: NEGATIVE
Protein, ur: 100 mg/dL — AB
Specific Gravity, Urine: 1.027 (ref 1.005–1.030)
Urobilinogen, UA: 1 mg/dL (ref 0.0–1.0)
pH: 7 (ref 5.0–8.0)

## 2010-11-26 LAB — RPR: RPR Ser Ql: NONREACTIVE

## 2010-11-26 LAB — GC/CHLAMYDIA PROBE AMP, GENITAL: Chlamydia, DNA Probe: NEGATIVE

## 2010-11-30 LAB — URINALYSIS, ROUTINE W REFLEX MICROSCOPIC
Bilirubin Urine: NEGATIVE
Glucose, UA: NEGATIVE mg/dL
Ketones, ur: NEGATIVE mg/dL
Protein, ur: 30 mg/dL — AB

## 2010-11-30 LAB — WET PREP, GENITAL
Clue Cells Wet Prep HPF POC: NONE SEEN
Trich, Wet Prep: NONE SEEN

## 2010-11-30 LAB — GC/CHLAMYDIA PROBE AMP, GENITAL: GC Probe Amp, Genital: NEGATIVE

## 2010-11-30 LAB — URINE MICROSCOPIC-ADD ON

## 2011-01-02 NOTE — Assessment & Plan Note (Signed)
[facility]                            CARDIOLOGY OFFICE NOTE   [name]                          MRN:          [id number]  DATE:09/18/2007                            DOB:          June 21, 1961    I was asked by Dr. Reche [name] in [facility] to consult on Nicole [name]  with palpitations.  She had these off and on for years.   She is 50 years of age.  She smokes about 3 to 4 cigarettes a day and  also enjoys having some fun with drinking wine.  She does not drink  liquor or beer.   She notes her palpitations are worse after she has had some wine and  maybe a fun night.   She blacked out back in December after standing up to go wash the  dishes.  It was on night which she had imbibed and had some fun.   She denies any increase with exertion.  She has no angina or significant  dyspnea on exertion.   Her cardiac risk factors are a family history.  She does not have a  history of hypertension is not diabetic and does not have a history of  hyperlipidemia.   Recent blood work was within normal limits including a TSH, CBC,  comprehensive metabolic panel.  I do not see a lipid panel.   Past medical history is on no medications.  She lists no drug allergies.   She has had no history of surgery.   SOCIAL HISTORY:  She is disabled, unemployed, she is single and has  three children.   REVIEW OF SYSTEMS:  History of allergies, hay fever, some mild anemia in  the past.  She has had some bronchial pneumonia in the past.   EXAM:  Today her blood pressure is 92/60, her pulse 64 and regular.  EKG  shows sinus rhythm with normal PR, QRS and QTC interval.  She is 5 feet  5 inches, weighs 144 pounds.  HEENT:  Normocephalic, atraumatic.  PERRL.  Extraocular is intact.  Sclera clear.  Face symmetry is normal.  Carotids are equal bilateral bruits, no JVD.  Thyroid is not enlarged.  Neck is supple.  Lungs were clear to auscultation.  HEART:  Reveals a  nondisplaced PMI.  She has normal S1-S2 without murmur  or gallop.  Abdominal exam is soft.  There is no obvious midline bruit or  organomegaly.  EXTREMITIES:  No edema.  Pulses are brisk.  NEURO:  Exam is intact.   She had a chest x-ray May 19 2007 which showed some mild pulmonary  scarring no active disease.  Normal heart size and mediastinum was  normal.   ASSESSMENT:  1. Probable benign palpitations.  2. Substance abuse in the form of tobacco and alcohol.   RECOMMENDATIONS:  1. 2-D echocardiogram running structural heart disease.  If this is      normal simple reassurance will be given.  2. I will see back in a couple weeks to answer any questions and go      over the  findings of the echo.     Nicole C. Daleen Squibb, MD, [name]  Electronically Signed    TCW/MedQ  DD: 09/18/2007  DT: 09/18/2007  Job #: [id number]   cc:   Nicole Kid. Nicole [name], M.D.

## 2011-01-02 NOTE — Assessment & Plan Note (Signed)
[facility]                            CARDIOLOGY OFFICE NOTE   [name], Nicole [name]                          MRN:          [id number]  DATE:10/07/2007                            DOB:          [birth year]    Nicole [name] returns today for followup of our initial evaluation of  September 18, 2007.  At that time she was having some noncardiac chest discomfort  but more importantly was having palpitations.   She has some cardiac risk factors which include tobacco use as well as  family history.   She had recent blood work which was normal.  She also had a chest x-ray  in September 2008 which shows some pulmonary scarring but no active  disease.  She had no evidence of cardiomegaly.   We performed a 2-D echocardiogram which was normal.  Her EF was 55-65%  with no segmental wall motion abnormalities.  She had no evidence of  left ventricular hypertrophy.  Her valve structures were normal and  function normally.   She has had very few palpitations.  She says she is going to join a gym  and start exercising.  I have advised her for 3 hours a week.   She smokes and drinks, but it is very moderate at best.   PHYSICAL EXAMINATION:  VITAL SIGNS:  Her blood pressure today is 117/75,  pulse 74 and regular.  Her weight is 144 and stable.  HEART:  Reveals a regular rate and rhythm.  No gallop or rub.  LUNGS:  Clear.  ABDOMEN:  Soft with good bowel sounds.  EXTREMITIES:  No edema.  Pulses are present.   I have reviewed the findings of the echocardiogram with Nicole [name].  I have  also talked about risk factor modification.  We talked about exercise  for 3 hours a week.  I will see her back on a p.r.n. basis.  She is  currently on no medications.     Thomas C. Daleen Squibb, MD, [facility]  Electronically Signed    TCW/MedQ  DD: 10/07/2007  DT: 10/08/2007  Job #: [id number]   cc:   Dr. Cath[facility]

## 2011-01-15 ENCOUNTER — Emergency Department (HOSPITAL_COMMUNITY)
Admission: EM | Admit: 2011-01-15 | Discharge: 2011-01-15 | Disposition: A | Discharge status: Home / Self Care | Payer: Medicare Other | Attending: Emergency Medicine | Admitting: Emergency Medicine

## 2011-01-15 DIAGNOSIS — K089 Disorder of teeth and supporting structures, unspecified: Secondary | ICD-10-CM | POA: Insufficient documentation

## 2011-01-15 DIAGNOSIS — F17200 Nicotine dependence, unspecified, uncomplicated: Secondary | ICD-10-CM | POA: Insufficient documentation

## 2011-01-15 DIAGNOSIS — K029 Dental caries, unspecified: Secondary | ICD-10-CM | POA: Insufficient documentation

## 2011-01-16 ENCOUNTER — Encounter: Payer: Self-pay | Admitting: Cardiology

## 2011-01-26 ENCOUNTER — Encounter: Payer: Self-pay | Admitting: Cardiology

## 2011-01-30 ENCOUNTER — Encounter: Payer: Self-pay | Admitting: Cardiology

## 2011-03-08 ENCOUNTER — Encounter: Payer: Self-pay | Admitting: Cardiology

## 2011-03-12 ENCOUNTER — Encounter: Payer: Self-pay | Admitting: Cardiology

## 2011-03-19 ENCOUNTER — Emergency Department (HOSPITAL_COMMUNITY)
Admission: EM | Admit: 2011-03-19 | Discharge: 2011-03-19 | Disposition: A | Discharge status: Home / Self Care | Payer: Medicare Other | Attending: Emergency Medicine | Admitting: Emergency Medicine

## 2011-03-19 DIAGNOSIS — N12 Tubulo-interstitial nephritis, not specified as acute or chronic: Secondary | ICD-10-CM | POA: Insufficient documentation

## 2011-03-19 DIAGNOSIS — F17200 Nicotine dependence, unspecified, uncomplicated: Secondary | ICD-10-CM | POA: Insufficient documentation

## 2011-03-19 DIAGNOSIS — R300 Dysuria: Secondary | ICD-10-CM | POA: Insufficient documentation

## 2011-03-19 LAB — URINALYSIS, ROUTINE W REFLEX MICROSCOPIC
Protein, ur: NEGATIVE mg/dL
Urobilinogen, UA: 1 mg/dL (ref 0.0–1.0)

## 2011-03-19 LAB — URINE MICROSCOPIC-ADD ON

## 2011-03-29 ENCOUNTER — Emergency Department (HOSPITAL_COMMUNITY)
Admission: EM | Admit: 2011-03-29 | Discharge: 2011-03-29 | Disposition: A | Discharge status: Home / Self Care | Payer: Medicare Other | Attending: Emergency Medicine | Admitting: Emergency Medicine

## 2011-03-29 ENCOUNTER — Encounter (HOSPITAL_COMMUNITY): Payer: Self-pay | Admitting: Radiology

## 2011-03-29 ENCOUNTER — Emergency Department (HOSPITAL_COMMUNITY): Payer: Medicare Other

## 2011-03-29 DIAGNOSIS — R300 Dysuria: Secondary | ICD-10-CM | POA: Insufficient documentation

## 2011-03-29 DIAGNOSIS — R109 Unspecified abdominal pain: Secondary | ICD-10-CM | POA: Insufficient documentation

## 2011-03-29 DIAGNOSIS — F341 Dysthymic disorder: Secondary | ICD-10-CM | POA: Insufficient documentation

## 2011-03-29 DIAGNOSIS — B9689 Other specified bacterial agents as the cause of diseases classified elsewhere: Secondary | ICD-10-CM | POA: Insufficient documentation

## 2011-03-29 DIAGNOSIS — M545 Low back pain, unspecified: Secondary | ICD-10-CM | POA: Insufficient documentation

## 2011-03-29 DIAGNOSIS — R319 Hematuria, unspecified: Secondary | ICD-10-CM | POA: Insufficient documentation

## 2011-03-29 DIAGNOSIS — A499 Bacterial infection, unspecified: Secondary | ICD-10-CM | POA: Insufficient documentation

## 2011-03-29 DIAGNOSIS — N760 Acute vaginitis: Secondary | ICD-10-CM | POA: Insufficient documentation

## 2011-03-29 LAB — URINALYSIS, ROUTINE W REFLEX MICROSCOPIC
Bilirubin Urine: NEGATIVE
Leukocytes, UA: NEGATIVE
Nitrite: NEGATIVE
Specific Gravity, Urine: 1.018 (ref 1.005–1.030)
pH: 6 (ref 5.0–8.0)

## 2011-03-29 LAB — DIFFERENTIAL
Lymphocytes Relative: 43 % (ref 12–46)
Lymphs Abs: 1.9 10*3/uL (ref 0.7–4.0)
Monocytes Relative: 7 % (ref 3–12)
Neutro Abs: 2.1 10*3/uL (ref 1.7–7.7)
Neutrophils Relative %: 48 % (ref 43–77)

## 2011-03-29 LAB — CBC
HCT: 32.7 % — ABNORMAL LOW (ref 36.0–46.0)
Hemoglobin: 11 g/dL — ABNORMAL LOW (ref 12.0–15.0)
MCH: 30.6 pg (ref 26.0–34.0)
MCV: 91.1 fL (ref 78.0–100.0)
RBC: 3.59 MIL/uL — ABNORMAL LOW (ref 3.87–5.11)

## 2011-03-29 LAB — COMPREHENSIVE METABOLIC PANEL
ALT: 8 U/L (ref 0–35)
AST: 12 U/L (ref 0–37)
Albumin: 3.7 g/dL (ref 3.5–5.2)
Alkaline Phosphatase: 44 U/L (ref 39–117)
CO2: 28 mEq/L (ref 19–32)
Chloride: 106 mEq/L (ref 96–112)
Glucose, Bld: 77 mg/dL (ref 70–99)
Total Protein: 6.2 g/dL (ref 6.0–8.3)

## 2011-03-29 LAB — PREGNANCY, URINE: Preg Test, Ur: NEGATIVE

## 2011-03-29 LAB — WET PREP, GENITAL: Yeast Wet Prep HPF POC: NONE SEEN

## 2011-03-29 LAB — URINE MICROSCOPIC-ADD ON

## 2011-03-30 LAB — URINE CULTURE
Colony Count: 9000
Culture  Setup Time: 201208091015

## 2011-03-30 LAB — GC/CHLAMYDIA PROBE AMP, GENITAL: GC Probe Amp, Genital: NEGATIVE

## 2011-05-15 ENCOUNTER — Encounter: Payer: Self-pay | Admitting: Cardiology

## 2011-05-15 ENCOUNTER — Ambulatory Visit (INDEPENDENT_AMBULATORY_CARE_PROVIDER_SITE_OTHER): Payer: Medicare Other | Admitting: Cardiology

## 2011-05-15 VITALS — BP 90/62 | HR 73 | Ht 65.0 in | Wt 149.4 lb

## 2011-05-15 DIAGNOSIS — R002 Palpitations: Secondary | ICD-10-CM | POA: Insufficient documentation

## 2011-05-15 DIAGNOSIS — R079 Chest pain, unspecified: Secondary | ICD-10-CM

## 2011-05-15 NOTE — Patient Instructions (Signed)
Your physician recommends that you schedule a follow-up appointment as needed with Dr. Daleen Squibb

## 2011-05-15 NOTE — Progress Notes (Signed)
HPI Miss Nicole [name] comes in today for followup of a history of palpitations and syncope. She is negative workup several years ago for this.  The patient has some chest tightness over her left breast. It does not radiate. It is not associated with any other symptoms. It is not exertion related. It is not made worse by taking a deep breath.  He continues to smoke cigarettes as well as marijuana. Recently, the marijuana has been laced with ammonia. She says this is the thing that they do on the streets. He  Her EKG today is normal. Past Medical History  Diagnosis Date  . Perimenopause   . Palpitations     negative cardiac w/u per Dr. Daleen Squibb 2/09  . Anemia   . Depression     bipolar  . History of migraine headaches     headaches reported as migraines    Past Surgical History  Procedure Date  . Tubal ligation 1987  . Exploratory laparotomy 2000    x2 (Dr. Dierdre Forth)    Family History  Problem Relation Age of Onset  . Hypertension Mother   . Diabetes Mother   . Heart disease Mother   . Thyroid disease Mother   . Breast cancer Maternal Aunt   . Pancreatic cancer Maternal Uncle   . Pancreatic cancer Other     family history  . Colon cancer Neg Hx     History   Social History  . Marital Status: Single    Spouse Name: N/A    Number of Children: 3  . Years of Education: N/A   Occupational History  . 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 Smoking status: Current Some Day Smoker  . Smokeless tobacco: Not on file   Comment: Few  . Alcohol Use: Yes     Rare glass of wine  . Drug Use: Yes    Special: Marijuana     Rare marijuana; has been counseled to quit tobacco and marijuana  . Sexually Active: Not on file   Other Topics Concern  . Not on file   Social History Narrative  . No narrative on file    No Known Allergies  No current outpatient prescriptions on file.    ROS Negative other than HPI.   PE General  Appearance: well developed, well nourished in no acute distress HEENT: symmetrical face, PERRLA, good dentition  Neck: no JVD, thyromegaly, or adenopathy, trachea midline Chest: symmetric without deformity Cardiac: PMI non-displaced, RRR, normal S1, S2, no gallop or murmur Lung: clear to ausculation and percussion Vascular: all pulses full without bruits  Abdominal: nondistended, nontender, good bowel sounds, no HSM, no bruits Extremities: no cyanosis, clubbing or edema, no sign of DVT, no varicosities  Skin: normal color, no rashes Neuro: alert and oriented x 3, non-focal Pysch: normal affect Filed Vitals:   05/15/11 1159  BP: 90/62  Pulse: 73  Height: 5\' 5"  (1.651 m)  Weight: 149 lb 6.4 oz (67.767 kg)    EKG  Labs and Studies Reviewed.   Lab Results  Component Value Date   WBC 4.5 03/29/2011   HGB 11.0* 03/29/2011   HCT 32.7* 03/29/2011   MCV 91.1 03/29/2011   PLT 181 03/29/2011      Chemistry      Component Value Date/Time   NA 140 03/29/2011 1157   K 3.8 03/29/2011 1157   CL 106 03/29/2011 1157   CO2 28 03/29/2011  1157   BUN 8 03/29/2011 1157   CREATININE 0.53 03/29/2011 1157      Component Value Date/Time   CALCIUM 9.1 03/29/2011 1157   ALKPHOS 44 03/29/2011 1157   AST 12 03/29/2011 1157   ALT 8 03/29/2011 1157   BILITOT 0.4 03/29/2011 1157       Lab Results  Component Value Date   CHOL 200 01/04/2010   Lab Results  Component Value Date   HDL 64 01/04/2010   Lab Results  Component Value Date   LDLCALC 123* 01/04/2010   Lab Results  Component Value Date   TRIG 66 01/04/2010   Lab Results  Component Value Date   CHOLHDL 3.1 Ratio 01/04/2010   No results found for this basename: HGBA1C   Lab Results  Component Value Date   ALT 8 03/29/2011   AST 12 03/29/2011   ALKPHOS 44 03/29/2011   BILITOT 0.4 03/29/2011   Lab Results  Component Value Date   TSH 0.439 12/22/2009

## 2011-05-16 LAB — URINALYSIS, ROUTINE W REFLEX MICROSCOPIC
Bilirubin Urine: NEGATIVE
Ketones, ur: NEGATIVE
Nitrite: NEGATIVE
Urobilinogen, UA: 1

## 2011-05-16 LAB — WET PREP, GENITAL
Trich, Wet Prep: NONE SEEN
Yeast Wet Prep HPF POC: NONE SEEN

## 2011-05-26 ENCOUNTER — Emergency Department (HOSPITAL_COMMUNITY)
Admission: EM | Admit: 2011-05-26 | Discharge: 2011-05-26 | Disposition: A | Discharge status: Home / Self Care | Payer: Medicare Other | Attending: Emergency Medicine | Admitting: Emergency Medicine

## 2011-05-26 DIAGNOSIS — F17200 Nicotine dependence, unspecified, uncomplicated: Secondary | ICD-10-CM | POA: Insufficient documentation

## 2011-05-26 DIAGNOSIS — J029 Acute pharyngitis, unspecified: Secondary | ICD-10-CM | POA: Insufficient documentation

## 2011-05-26 DIAGNOSIS — J40 Bronchitis, not specified as acute or chronic: Secondary | ICD-10-CM | POA: Insufficient documentation

## 2011-05-26 LAB — RAPID STREP SCREEN (MED CTR MEBANE ONLY): Streptococcus, Group A Screen (Direct): NEGATIVE

## 2011-05-31 LAB — URINALYSIS, ROUTINE W REFLEX MICROSCOPIC
Bilirubin Urine: NEGATIVE
Nitrite: NEGATIVE
Protein, ur: NEGATIVE
Specific Gravity, Urine: 1.02
Urobilinogen, UA: 0.2

## 2011-05-31 LAB — GC/CHLAMYDIA PROBE AMP, GENITAL: Chlamydia, DNA Probe: NEGATIVE

## 2011-05-31 LAB — URINE MICROSCOPIC-ADD ON

## 2011-05-31 LAB — WET PREP, GENITAL

## 2011-05-31 LAB — PREGNANCY, URINE: Preg Test, Ur: NEGATIVE

## 2011-05-31 LAB — RPR: RPR Ser Ql: NONREACTIVE

## 2011-09-05 ENCOUNTER — Encounter: Payer: Self-pay | Admitting: Internal Medicine

## 2011-09-20 ENCOUNTER — Ambulatory Visit (AMBULATORY_SURGERY_CENTER): Payer: Medicare Other

## 2011-09-20 VITALS — Ht 65.5 in | Wt 152.0 lb

## 2011-09-20 DIAGNOSIS — K6289 Other specified diseases of anus and rectum: Secondary | ICD-10-CM

## 2011-09-20 MED ORDER — PEG-KCL-NACL-NASULF-NA ASC-C 100 G PO SOLR: 1.0000 | Freq: Once | ORAL | Status: DC

## 2011-10-03 ENCOUNTER — Telehealth: Payer: Self-pay | Admitting: Internal Medicine

## 2011-10-03 ENCOUNTER — Encounter: Payer: Self-pay | Admitting: Internal Medicine

## 2011-10-03 NOTE — Telephone Encounter (Signed)
ERROR

## 2011-10-03 NOTE — Telephone Encounter (Signed)
Yes - Charge

## 2011-10-04 ENCOUNTER — Other Ambulatory Visit: Payer: Medicare Other | Admitting: Internal Medicine

## 2011-10-15 ENCOUNTER — Telehealth: Payer: Self-pay | Admitting: Internal Medicine

## 2011-10-15 MED ORDER — PEG-KCL-NACL-NASULF-NA ASC-C 100 G PO SOLR: 1.0000 | Freq: Once | ORAL | Status: DC

## 2011-10-15 NOTE — Telephone Encounter (Signed)
Moviprep kit and voucher phoned in to [employer].

## 2011-10-23 ENCOUNTER — Ambulatory Visit (AMBULATORY_SURGERY_CENTER): Payer: Medicare Other | Admitting: Internal Medicine

## 2011-10-23 ENCOUNTER — Encounter: Payer: Self-pay | Admitting: Internal Medicine

## 2011-10-23 VITALS — BP 115/72 | HR 59 | Temp 96.7°F | Resp 16 | Ht 65.0 in | Wt 152.0 lb

## 2011-10-23 DIAGNOSIS — K6289 Other specified diseases of anus and rectum: Secondary | ICD-10-CM

## 2011-10-23 DIAGNOSIS — Z1211 Encounter for screening for malignant neoplasm of colon: Secondary | ICD-10-CM

## 2011-10-23 MED ORDER — SODIUM CHLORIDE 0.9 % IV SOLN: 500.0000 mL | INTRAVENOUS | Status: DC

## 2011-10-23 NOTE — Progress Notes (Signed)
Pt. States that she smokes "3 reefers" everyday. States she smoked "1/2 reefer" before coming in today.  Pt. Affect and behavior Appears sober.  Dr. Leone Payor advised. No orders Given.

## 2011-10-23 NOTE — Op Note (Signed)
[facility] [address] [place], [place]  [address]  COLONOSCOPY PROCEDURE REPORT  PATIENT:  Nicole, [name]  MR#:  [id number] BIRTHDATE:  Jan 01, 1961, 50 yrs. old  GENDER:  female ENDOSCOPIST:  Iva Boop, MD, [facility] REF. BY:  Yisroel Ramming, M.D. PROCEDURE DATE:  10/23/2011 PROCEDURE:  Colonoscopy 01027 ASA CLASS:  Class I INDICATIONS:  Screening rectal pain MEDICATIONS:   These medications were titrated to patient response per physician's verbal order, Fentanyl 75 mcg IV, Versed 8 mg IV  DESCRIPTION OF PROCEDURE:   After the risks benefits and alternatives of the procedure were thoroughly explained, informed consent was obtained.  Digital rectal exam was performed and revealed no abnormalities.   The LB 180AL [id number] endoscope was introduced through the anus and advanced to the cecum, which was identified by both the appendix and ileocecal valve, without limitations.  IC valve photo omitted. The quality of the prep was excellent, using MoviPrep.  The instrument was then slowly withdrawn as the colon was fully examined. <<PROCEDUREIMAGES>>  FINDINGS:  A normal appearing cecum, ileocecal valve, and appendiceal orifice were identified. The ascending, hepatic flexure, transverse, splenic flexure, descending, sigmoid colon, and rectum appeared unremarkable.   Retroflexed views in the rectum revealed no abnormalities.    The time to cecum = 6:34 minutes. The scope was then withdrawn in 6:35 minutes from the cecum and the procedure completed. COMPLICATIONS:  None ENDOSCOPIC IMPRESSION: 1) Normal colonoscopy  REPEAT EXAM:  In 10 year(s) for routine screening colonoscopy.  Iva Boop, MD, [name]  CC:  Yisroel Ramming, MD and The Patient  n. eSIGNED:   Iva Boop at 10/23/2011 03:06 PM  [place], [name], [phone]

## 2011-10-23 NOTE — Progress Notes (Signed)
Patient did not experience any of the following events: a burn prior to discharge; a fall within the facility; wrong site/side/patient/procedure/implant event; or a hospital transfer or hospital admission upon discharge from the facility. (G8907) Patient did not have preoperative order for IV antibiotic SSI prophylaxis. (G8918)  

## 2011-10-23 NOTE — Patient Instructions (Signed)
YOU HAD AN ENDOSCOPIC PROCEDURE TODAY AT THE [facility]: Refer to the procedure report that was given to you for any specific questions about what was found during the examination.  If the procedure report does not answer your questions, please call your gastroenterologist to clarify.  If you requested that your care partner not be given the details of your procedure findings, then the procedure report has been included in a sealed envelope for you to review at your convenience later.  YOU SHOULD EXPECT: Some feelings of bloating in the abdomen. Passage of more gas than usual.  Walking can help get rid of the air that was put into your GI tract during the procedure and reduce the bloating. If you had a lower endoscopy (such as a colonoscopy or flexible sigmoidoscopy) you may notice spotting of blood in your stool or on the toilet paper. If you underwent a bowel prep for your procedure, then you may not have a normal bowel movement for a few days.  DIET: Your first meal following the procedure should be a light meal and then it is ok to progress to your normal diet.  A half-sandwich or bowl of soup is an example of a good first meal.  Heavy or fried foods are harder to digest and may make you feel nauseous or bloated.  Likewise meals heavy in dairy and vegetables can cause extra gas to form and this can also increase the bloating.  Drink plenty of fluids but you should avoid alcoholic beverages for 24 hours.  ACTIVITY: Your care partner should take you home directly after the procedure.  You should plan to take it easy, moving slowly for the rest of the day.  You can resume normal activity the day after the procedure however you should NOT DRIVE or use heavy machinery for 24 hours (because of the sedation medicines used during the test).    SYMPTOMS TO REPORT IMMEDIATELY: A gastroenterologist can be reached at any hour.  During normal business hours, 8:30 AM to 5:00 PM Monday through Friday,  call [phone].  After hours and on weekends, please call the GI answering service at [phone] who will take a message and have the physician on call contact you.   Following lower endoscopy (colonoscopy or flexible sigmoidoscopy):  Excessive amounts of blood in the stool  Significant tenderness or worsening of abdominal pains  Swelling of the abdomen that is new, acute  Fever of 100F or higher    FOLLOW UP: If any biopsies were taken you will be contacted by phone or by letter within the next 1-3 weeks.  Call your gastroenterologist if you have not heard about the biopsies in 3 weeks.  Our staff will call the home number listed on your records the next business day following your procedure to check on you and address any questions or concerns that you may have at that time regarding the information given to you following your procedure. This is a courtesy call and so if there is no answer at the home number and we have not heard from you through the emergency physician on call, we will assume that you have returned to your regular daily activities without incident.  SIGNATURES/CONFIDENTIALITY: You and/or your care partner have signed paperwork which will be entered into your electronic medical record.  These signatures attest to the fact that that the information above on your After Visit Summary has been reviewed and is understood.  Full responsibility of the confidentiality   of this discharge information lies with you and/or your care-partner.     

## 2011-10-24 ENCOUNTER — Telehealth: Payer: Self-pay | Admitting: *Deleted

## 2011-10-24 NOTE — Telephone Encounter (Signed)
  Follow up Call-  Call back number 10/23/2011  Post procedure Call Back phone  # [phone]  Permission to leave phone message Yes     Patient questions:  Do you have a fever, pain , or abdominal swelling? no Pain Score  0 *  Have you tolerated food without any problems? yes  Have you been able to return to your normal activities? yes  Do you have any questions about your discharge instructions: Diet   no Medications  no Follow up visit  no  Do you have questions or concerns about your Care? no  Actions: * If pain score is 4 or above: No action needed, pain <4.

## 2012-01-16 ENCOUNTER — Encounter (HOSPITAL_COMMUNITY): Payer: Self-pay

## 2012-01-16 ENCOUNTER — Emergency Department (HOSPITAL_COMMUNITY): Payer: Medicare Other

## 2012-01-16 ENCOUNTER — Emergency Department (HOSPITAL_COMMUNITY)
Admission: EM | Admit: 2012-01-16 | Discharge: 2012-01-16 | Disposition: A | Discharge status: Home Health | Payer: Medicare Other | Attending: Emergency Medicine | Admitting: Emergency Medicine

## 2012-01-16 DIAGNOSIS — R109 Unspecified abdominal pain: Secondary | ICD-10-CM | POA: Insufficient documentation

## 2012-01-16 DIAGNOSIS — R350 Frequency of micturition: Secondary | ICD-10-CM | POA: Insufficient documentation

## 2012-01-16 DIAGNOSIS — F329 Major depressive disorder, single episode, unspecified: Secondary | ICD-10-CM | POA: Insufficient documentation

## 2012-01-16 DIAGNOSIS — K219 Gastro-esophageal reflux disease without esophagitis: Secondary | ICD-10-CM | POA: Insufficient documentation

## 2012-01-16 DIAGNOSIS — R1030 Lower abdominal pain, unspecified: Secondary | ICD-10-CM

## 2012-01-16 DIAGNOSIS — F3289 Other specified depressive episodes: Secondary | ICD-10-CM | POA: Insufficient documentation

## 2012-01-16 DIAGNOSIS — R300 Dysuria: Secondary | ICD-10-CM

## 2012-01-16 LAB — DIFFERENTIAL
Basophils Absolute: 0 10*3/uL (ref 0.0–0.1)
Basophils Relative: 1 % (ref 0–1)
Eosinophils Absolute: 0.2 10*3/uL (ref 0.0–0.7)
Eosinophils Relative: 4 % (ref 0–5)

## 2012-01-16 LAB — URINE MICROSCOPIC-ADD ON

## 2012-01-16 LAB — CBC
MCH: 30.9 pg (ref 26.0–34.0)
MCV: 91.4 fL (ref 78.0–100.0)
Platelets: 197 10*3/uL (ref 150–400)
RDW: 13 % (ref 11.5–15.5)

## 2012-01-16 LAB — URINALYSIS, ROUTINE W REFLEX MICROSCOPIC
Ketones, ur: NEGATIVE mg/dL
Leukocytes, UA: NEGATIVE
Nitrite: NEGATIVE
Protein, ur: NEGATIVE mg/dL
Urobilinogen, UA: 0.2 mg/dL (ref 0.0–1.0)
pH: 5.5 (ref 5.0–8.0)

## 2012-01-16 MED ORDER — PHENAZOPYRIDINE HCL 200 MG PO TABS: 200.0000 mg | ORAL_TABLET | Freq: Three times a day (TID) | ORAL | Status: AC

## 2012-01-16 MED ORDER — OXYCODONE-ACETAMINOPHEN 7.5-325 MG PO TABS: 1.0000 | ORAL_TABLET | ORAL | Status: AC | PRN

## 2012-01-16 NOTE — Discharge Instructions (Signed)
Abdominal Pain Abdominal pain can be caused by many things. Your caregiver decides the seriousness of your pain by an examination and possibly blood tests and X-rays. Many cases can be observed and treated at home. Most abdominal pain is not caused by a disease and will probably improve without treatment. However, in many cases, more time must pass before a clear cause of the pain can be found. Before that point, it may not be known if you need more testing, or if hospitalization or surgery is needed. HOME CARE INSTRUCTIONS   Do not take laxatives unless directed by your caregiver.   Take pain medicine only as directed by your caregiver.   Only take over-the-counter or prescription medicines for pain, discomfort, or fever as directed by your caregiver.   Try a clear liquid diet (broth, tea, or water) for as long as directed by your caregiver. Slowly move to a bland diet as tolerated.  SEEK IMMEDIATE MEDICAL CARE IF:   The pain does not go away.   You have a fever.   You keep throwing up (vomiting).   The pain is felt only in portions of the abdomen. Pain in the right side could possibly be appendicitis. In an adult, pain in the left lower portion of the abdomen could be colitis or diverticulitis.   You pass bloody or black tarry stools.  MAKE SURE YOU:   Understand these instructions.   Will watch your condition.   Will get help right away if you are not doing well or get worse.  Document Released: 05/16/2005 Document Revised: 07/26/2011 Document Reviewed: 03/24/2008 [facility] Patient Information 2012 [place], [place].Dysuria You have dysuria. This is pain on urination. Dysuria is often present with other symptoms such as:  A sudden urge to go.   Having to go more often.  Dysuria can be caused by:  Urinary tract infections.   Yeast infections.   Prostate problems.   Urinary stones.   Sexually transmitted diseases.  Lab tests of the urine will usually be needed to confirm  a urinary infection. An infection is the cause of dysuria in over half the cases. In older men the prostate gland enlarges and can cause urinary problems. These include:   Urinary obstruction.   Infection.   Pain on urination.  Bladder cancer can also cause blood in the urine and dysuria. If you have an infection, be sure to take the antibiotics prescribed for you until they are gone. This will help prevent a recurrence. Further checking by a specialist may be needed if the cause of your dysuria is not found. Cystoscopy, x-rays, pelvic exams, and special cultures may be needed to find the cause and help find the best treatment. See your caregiver right away if your symptoms are not improved after three days.  SEEK IMMEDIATE MEDICAL CARE IF:  You have difficulty urinating, pass bloody urine, or have chills or a fever. Document Released: 08/06/2005 Document Revised: 07/26/2011 Document Reviewed: 01/21/2007 [facility] Patient Information 2012 [place], [place].

## 2012-01-16 NOTE — ED Provider Notes (Signed)
History     CSN: [id number]  Arrival date & time 01/16/12  5284   First MD Initiated Contact with Patient 01/16/12 0701      Chief Complaint  Patient presents with  . Urinary Frequency     Patient is a 51 y.o. female presenting with frequency. The history is provided by the patient.  Urinary Frequency The current episode started more than 2 days ago. The problem occurs constantly. The problem has been gradually worsening. Associated symptoms comments: GERD. She has tried water for the symptoms. The treatment provided no relief.   Pt complains of burning upon urination and lower abd pain for three days  Past Medical History  Diagnosis Date  . Perimenopause   . Palpitations     negative cardiac w/u per Dr. Daleen Squibb 2/09  . Anemia   . Depression     bipolar  . History of migraine headaches     headaches reported as migraines    Past Surgical History  Procedure Date  . Tubal ligation 1987  . Exploratory laparotomy 2000    x2 (Dr. Dierdre Forth)    Family History  Problem Relation Age of Onset  . Hypertension Mother   . Diabetes Mother   . Heart disease Mother   . Thyroid disease Mother   . Breast cancer Maternal Aunt   . Pancreatic cancer Maternal Uncle   . Pancreatic cancer Other     family history  . Colon cancer Neg Hx     History  Substance Use Topics  . Smoking status: Former Smoker    Quit date: 07/20/2011  . Smokeless tobacco: Never Used   Comment: Few  . Alcohol Use: Yes     Rare glass of wine    OB History    Grav Para Term Preterm Abortions TAB SAB Ect Mult Living                  Review of Systems  Genitourinary: Positive for frequency.  All other systems reviewed and are negative.    Allergies  Review of patient's allergies indicates no known allergies.  Home Medications   Current Outpatient Rx  Name Route Sig Dispense Refill  . OXYCODONE-ACETAMINOPHEN 7.5-325 MG PO TABS Oral Take 1 tablet by mouth every 4 (four) hours as needed  for pain. 30 tablet 0  . PHENAZOPYRIDINE HCL 200 MG PO TABS Oral Take 1 tablet (200 mg total) by mouth 3 (three) times daily. 6 tablet 0    BP 118/81  Pulse 83  Temp(Src) 97.5 F (36.4 C) (Oral)  Resp 20  Wt 158 lb 3.2 oz (71.759 kg)  SpO2 99%  Physical Exam  Nursing note and vitals reviewed. Constitutional: She is oriented to person, place, and time. She appears well-developed and well-nourished. No distress.  HENT:  Head: Normocephalic and atraumatic.  Eyes: Pupils are equal, round, and reactive to light.  Neck: Normal range of motion.  Cardiovascular: Normal rate and intact distal pulses.   Pulmonary/Chest: No respiratory distress.  Abdominal: Normal appearance. She exhibits no distension. There is no tenderness. There is no rebound and no guarding.    Musculoskeletal: Normal range of motion.  Neurological: She is alert and oriented to person, place, and time. No cranial nerve deficit.  Skin: Skin is warm and dry. No rash noted.  Psychiatric: She has a normal mood and affect. Her behavior is normal.    ED Course  Procedures (including critical care time)  Labs Reviewed  URINALYSIS, ROUTINE W  REFLEX MICROSCOPIC - Abnormal; Notable for the following:    Hgb urine dipstick SMALL (*)    All other components within normal limits  URINE MICROSCOPIC-ADD ON - Abnormal; Notable for the following:    Bacteria, UA FEW (*)    All other components within normal limits  CBC - Abnormal; Notable for the following:    WBC 3.9 (*)    RBC 3.82 (*)    Hemoglobin 11.8 (*)    HCT 34.9 (*)    All other components within normal limits  DIFFERENTIAL - Abnormal; Notable for the following:    Neutrophils Relative 37 (*)    Neutro Abs 1.5 (*)    Lymphocytes Relative 50 (*)    All other components within normal limits   US Transvaginal Non-ob  01/16/2012  *RADIOLOGY REPORT*  Clinical Data: Pelvic discomfort.  Dysuria. Left lower quadrant pain.  TRANSABDOMINAL AND TRANSVAGINAL ULTRASOUND OF  PELVIS Technique:  Both transabdominal and transvaginal ultrasound examinations of the pelvis were performed. Transabdominal technique was performed for global imaging of the pelvis including uterus, ovaries, adnexal regions, and pelvic cul-de-sac.  Comparison: 06/26/2004.   It was necessary to proceed with endovaginal exam following the transabdominal exam to visualize the uterus, endometrial, ovaries and adnexal regions.  Findings:  Uterus: Measures 7.2 x 4.1 x 5.6 cm, normal.  Endometrium: 4 mm, within normal limits.  Right ovary:  Measures 2.7 x 1.9 x 2.3 cm, negative.  Left ovary: Measures 2.2 x 1.5 x 2.2 cm, negative.  Other findings: No free fluid.  IMPRESSION: Normal study. No evidence of pelvic mass or other significant abnormality.  Original Report Authenticated By: Reyes Ivan, M.D.   US Pelvis Complete  01/16/2012  *RADIOLOGY REPORT*  Clinical Data: Pelvic discomfort.  Dysuria. Left lower quadrant pain.  TRANSABDOMINAL AND TRANSVAGINAL ULTRASOUND OF PELVIS Technique:  Both transabdominal and transvaginal ultrasound examinations of the pelvis were performed. Transabdominal technique was performed for global imaging of the pelvis including uterus, ovaries, adnexal regions, and pelvic cul-de-sac.  Comparison: 06/26/2004.   It was necessary to proceed with endovaginal exam following the transabdominal exam to visualize the uterus, endometrial, ovaries and adnexal regions.  Findings:  Uterus: Measures 7.2 x 4.1 x 5.6 cm, normal.  Endometrium: 4 mm, within normal limits.  Right ovary:  Measures 2.7 x 1.9 x 2.3 cm, negative.  Left ovary: Measures 2.2 x 1.5 x 2.2 cm, negative.  Other findings: No free fluid.  IMPRESSION: Normal study. No evidence of pelvic mass or other significant abnormality.  Original Report Authenticated By: Reyes Ivan, M.D.     1. Dysuria   2. Urinary frequency   3. Lower abdominal pain       MDM  The exact cause of this patient's complaints is uncertain at the  present time.  Plan 1 symptomatic treatment and referral to her PCP.       Nelia Shi, MD 01/16/12 1038

## 2012-01-16 NOTE — ED Notes (Signed)
Pt complains of burning upon urination and lower abd pain for three days

## 2012-04-25 ENCOUNTER — Encounter (HOSPITAL_COMMUNITY): Payer: Self-pay

## 2012-04-25 ENCOUNTER — Emergency Department (INDEPENDENT_AMBULATORY_CARE_PROVIDER_SITE_OTHER)
Admission: EM | Admit: 2012-04-25 | Discharge: 2012-04-25 | Disposition: A | Discharge status: Home / Self Care | Payer: Medicare Other | Source: Home / Self Care | Attending: Emergency Medicine | Admitting: Emergency Medicine

## 2012-04-25 DIAGNOSIS — N3000 Acute cystitis without hematuria: Secondary | ICD-10-CM

## 2012-04-25 LAB — POCT URINALYSIS DIP (DEVICE)
Bilirubin Urine: NEGATIVE
Ketones, ur: NEGATIVE mg/dL
Leukocytes, UA: NEGATIVE
Protein, ur: NEGATIVE mg/dL
pH: 6.5 (ref 5.0–8.0)

## 2012-04-25 MED ORDER — PHENAZOPYRIDINE HCL 200 MG PO TABS: 200.0000 mg | ORAL_TABLET | Freq: Three times a day (TID) | ORAL | Status: AC | PRN

## 2012-04-25 MED ORDER — CEPHALEXIN 500 MG PO CAPS: 500.0000 mg | ORAL_CAPSULE | Freq: Three times a day (TID) | ORAL | Status: DC

## 2012-04-25 NOTE — ED Notes (Signed)
C/o syx of bladder infection

## 2012-04-25 NOTE — ED Provider Notes (Signed)
Chief Complaint  Patient presents with  . Cystitis    History of Present Illness:    Mrs. Santee is a 51 year old female with a two-day history of bladder pain and pressure, dysuria, pain or lower back, she's felt feverish and nauseated. Earlier in the week she took a bath for 3 days straight and Clorox and discharge her to. She denies any vomiting, blood in the stool, or GYN complaints. She is postmenopausal. She has had urinary tract infections before, most recently earlier in the year.  Review of Systems:  Other than noted above, the patient denies any of the following symptoms: General:  No fevers, chills, sweats, aches, or fatigue. GI:  No abdominal pain, back pain, nausea, vomiting, diarrhea, or constipation. GU:  No dysuria, frequency, urgency, hematuria, or incontinence. GYN:  No discharge, itching, vulvar pain or lesions, pelvic pain, or abnormal vaginal bleeding.  PMFSH:  Past medical history, family history, social history, meds, and allergies were reviewed.  Physical Exam:   Vital signs:  BP 122/74  Pulse 77  Temp 98.6 F (37 C) (Oral)  Resp 18  SpO2 100% Gen:  Alert, oriented, in no distress. Lungs:  Clear to auscultation, no wheezes, rales or rhonchi. Heart:  Regular rhythm, no gallop or murmer. Abdomen:  Flat and soft. There was slight suprapubic pain to palpation.  No guarding, or rebound.  No hepato-splenomegaly or mass.  Bowel sounds were normally active.  No hernia. Back:  No CVA tenderness.  Skin:  Clear, warm and dry.  Labs:   Results for orders placed during the hospital encounter of 04/25/12  POCT URINALYSIS DIP (DEVICE)      Component Value Range   Glucose, UA NEGATIVE  NEGATIVE mg/dL   Bilirubin Urine NEGATIVE  NEGATIVE   Ketones, ur NEGATIVE  NEGATIVE mg/dL   Specific Gravity, Urine 1.020  1.005 - 1.030   Hgb urine dipstick SMALL (*) NEGATIVE   pH 6.5  5.0 - 8.0   Protein, ur NEGATIVE  NEGATIVE mg/dL   Urobilinogen, UA 0.2  0.0 - 1.0 mg/dL   Nitrite  NEGATIVE  NEGATIVE   Leukocytes, UA NEGATIVE  NEGATIVE     Other Labs Obtained at Urgent Care Center:  A urine culture was obtained.  Results are pending at this time and we will call about any positive results.  Assessment: The encounter diagnosis was Acute cystitis.   Plan:   1.  The following meds were prescribed:   New Prescriptions   CEPHALEXIN (KEFLEX) 500 MG CAPSULE    Take 1 capsule (500 mg total) by mouth 3 (three) times daily.   PHENAZOPYRIDINE (PYRIDIUM) 200 MG TABLET    Take 1 tablet (200 mg total) by mouth 3 (three) times daily as needed for pain.   2.  The patient was instructed in symptomatic care and handouts were given. 3.  The patient was told to return if becoming worse in any way, if no better in 3 or 4 days, and given some red flag symptoms that would indicate earlier return. 4.  The patient was told to avoid intercourse for 10 days, get extra fluids, and return for a follow up with her primary care doctor at the completion of treatment for a repeat UA and culture.     Reuben Likes, MD 04/25/12 2203

## 2012-04-25 NOTE — ED Notes (Addendum)
Discussed medication compliance, cautioned about UA color change (no nice panties) plenty of fluids, recheck if condition changes

## 2012-04-27 LAB — URINE CULTURE

## 2012-04-30 ENCOUNTER — Emergency Department (HOSPITAL_COMMUNITY)
Admission: EM | Admit: 2012-04-30 | Discharge: 2012-04-30 | Disposition: A | Discharge status: Home / Self Care | Payer: Medicare Other | Attending: Emergency Medicine | Admitting: Emergency Medicine

## 2012-04-30 ENCOUNTER — Encounter (HOSPITAL_COMMUNITY): Payer: Self-pay | Admitting: Emergency Medicine

## 2012-04-30 DIAGNOSIS — N898 Other specified noninflammatory disorders of vagina: Secondary | ICD-10-CM

## 2012-04-30 DIAGNOSIS — F319 Bipolar disorder, unspecified: Secondary | ICD-10-CM | POA: Insufficient documentation

## 2012-04-30 DIAGNOSIS — Z87891 Personal history of nicotine dependence: Secondary | ICD-10-CM | POA: Insufficient documentation

## 2012-04-30 DIAGNOSIS — N72 Inflammatory disease of cervix uteri: Secondary | ICD-10-CM

## 2012-04-30 DIAGNOSIS — N899 Noninflammatory disorder of vagina, unspecified: Secondary | ICD-10-CM | POA: Insufficient documentation

## 2012-04-30 LAB — URINALYSIS, ROUTINE W REFLEX MICROSCOPIC
Bilirubin Urine: NEGATIVE
Nitrite: NEGATIVE
Specific Gravity, Urine: 1.022 (ref 1.005–1.030)
pH: 5.5 (ref 5.0–8.0)

## 2012-04-30 LAB — WET PREP, GENITAL: Clue Cells Wet Prep HPF POC: NONE SEEN

## 2012-04-30 LAB — URINE MICROSCOPIC-ADD ON

## 2012-04-30 MED ORDER — CEFTRIAXONE SODIUM 250 MG IJ SOLR
250.0000 mg | Freq: Once | INTRAMUSCULAR | Status: AC
  Administered 2012-04-30: 250 mg via INTRAMUSCULAR
  Filled 2012-04-30: qty 250

## 2012-04-30 MED ORDER — AZITHROMYCIN 250 MG PO TABS
1000.0000 mg | ORAL_TABLET | Freq: Once | ORAL | Status: AC
  Administered 2012-04-30: 1000 mg via ORAL
  Filled 2012-04-30: qty 4

## 2012-04-30 MED ORDER — LIDOCAINE HCL (PF) 1 % IJ SOLN
1.0000 mL | Freq: Once | INTRAMUSCULAR | Status: AC
  Administered 2012-04-30: 1 mL

## 2012-04-30 MED ORDER — LIDOCAINE HCL (PF) 1 % IJ SOLN
INTRAMUSCULAR | Status: AC
  Administered 2012-04-30: 1 mL
  Filled 2012-04-30: qty 5

## 2012-04-30 NOTE — ED Notes (Signed)
Pt stated that she did have a small blister on her peri area that she thought might could have been an outbreak of herpes.

## 2012-04-30 NOTE — ED Notes (Signed)
Pt stated that she had a "Clorox bath" last week which consisted of water clorox, soap and dish detergent. Pt stated that she has bladder pain and went to urgent care Friday and they told her she had blood in her urine. Pt is supposed to see a urologist but the pain is too bad that she can't wait until her appointment on 05/22/12. Pt c/o right flank pain. Pt stated that she is currently taking Keflex 500mg  and Pyridum 200mg  both last taken last night.

## 2012-04-30 NOTE — ED Provider Notes (Signed)
History     CSN: [id number]  Arrival date & time 04/30/12  4098   First MD Initiated Contact with Patient 04/30/12 0914      Chief Complaint  Patient presents with  . Hematuria  . Back Pain    (Consider location/radiation/quality/duration/timing/severity/associated sxs/prior treatment) HPI Comments: Patient presents with one week of vaginal pain, hematuria, right flank pain. She was seen in urgent care last week and given Keflex and Pyridium. She describes pain started after taking a "Clorox bath" with water, Clorox and dish detergent. She did this because she thought she had a "wood smell" coming from her body. She denies any fevers, vomiting, diarrhea, abdominal pain, chest pain or shortness of breath. She has a history of genital herpes but states this pain is different.  The history is provided by the patient.    Past Medical History  Diagnosis Date  . Perimenopause   . Palpitations     negative cardiac w/u per Dr. Daleen Squibb 2/09  . Anemia   . Depression     bipolar  . History of migraine headaches     headaches reported as migraines    Past Surgical History  Procedure Date  . Tubal ligation 1987  . Exploratory laparotomy 2000    x2 (Dr. Dierdre Forth)  . Herpes simplex virus dfa     Family History  Problem Relation Age of Onset  . Hypertension Mother   . Diabetes Mother   . Heart disease Mother   . Thyroid disease Mother   . Breast cancer Maternal Aunt   . Pancreatic cancer Maternal Uncle   . Pancreatic cancer Other     family history  . Colon cancer Neg Hx     History  Substance Use Topics  . Smoking status: Former Smoker    Quit date: 07/20/2011  . Smokeless tobacco: Never Used   Comment: Few  . Alcohol Use: Yes     Rare glass of wine    OB History    Grav Para Term Preterm Abortions TAB SAB Ect Mult Living                  Review of Systems  Constitutional: Negative for activity change and appetite change.  HENT: Negative for congestion and  rhinorrhea.   Respiratory: Negative for cough, chest tightness and shortness of breath.   Cardiovascular: Negative for chest pain.  Gastrointestinal: Negative for nausea, vomiting and abdominal pain.  Genitourinary: Positive for dysuria, hematuria, genital sores, vaginal pain and pelvic pain. Negative for vaginal bleeding and vaginal discharge.  Musculoskeletal: Positive for back pain.  Skin: Positive for rash.  Neurological: Negative for dizziness and headaches.    Allergies  Other  Home Medications   Current Outpatient Rx  Name Route Sig Dispense Refill  . CEPHALEXIN 500 MG PO CAPS Oral Take 500 mg by mouth 3 (three) times daily. Started 9/6 For 10 days    . OXYCODONE-ACETAMINOPHEN 7.5-325 MG PO TABS Oral Take 0.5 tablets by mouth every 4 (four) hours as needed. For pain      BP 111/81  Pulse 93  Temp 98.3 F (36.8 C) (Oral)  Resp 18  SpO2 99%  LMP 02/17/2011  Physical Exam  Constitutional: She is oriented to person, place, and time. She appears well-developed and well-nourished. No distress.  HENT:  Head: Normocephalic and atraumatic.  Mouth/Throat: Oropharynx is clear and moist. No oropharyngeal exudate.  Eyes: Conjunctivae normal and EOM are normal. Pupils are equal, round, and reactive to  light.  Neck: Normal range of motion. Neck supple.  Cardiovascular: Normal rate, regular rhythm and normal heart sounds.   No murmur heard. Pulmonary/Chest: Effort normal and breath sounds normal. No respiratory distress.  Abdominal: Soft. There is no tenderness. There is no rebound and no guarding.       Abdomen soft and nontender  Genitourinary: Cervix exhibits no motion tenderness. Right adnexum displays no mass and no tenderness. Left adnexum displays no mass and no tenderness. No vaginal discharge found.       No external genital lesions or rashes  Musculoskeletal: Normal range of motion. She exhibits tenderness. She exhibits no edema.       Mild right CVA pain  Neurological:  She is alert and oriented to person, place, and time. No cranial nerve deficit.  Skin: Skin is warm.    ED Course  Procedures (including critical care time)  Labs Reviewed  URINALYSIS, ROUTINE W REFLEX MICROSCOPIC - Abnormal; Notable for the following:    Hgb urine dipstick SMALL (*)     All other components within normal limits  WET PREP, GENITAL - Abnormal; Notable for the following:    WBC, Wet Prep HPF POC MODERATE (*)     All other components within normal limits  PREGNANCY, URINE  URINE MICROSCOPIC-ADD ON  GC/CHLAMYDIA PROBE AMP, GENITAL   No results found.   1. Vaginal irritation   2. Cervicitis       MDM  Hematuria, vaginal pain, dysuria with right flank pain for the past week after chemical irritant. Abdomen soft, vitals stable  Urinalysis negative, hCG negative, pelvic exam benign.  Suspect patient's symptoms are due to irritant nature of bleach. Instructed to refrain from bleach bathes. Treated for cervicitis. Followup with PCP continue antibiotics given in urgent care   Glynn Octave, MD 04/30/12 1525

## 2012-05-01 LAB — GC/CHLAMYDIA PROBE AMP, GENITAL
Chlamydia, DNA Probe: NEGATIVE
GC Probe Amp, Genital: NEGATIVE

## 2012-05-05 ENCOUNTER — Ambulatory Visit (INDEPENDENT_AMBULATORY_CARE_PROVIDER_SITE_OTHER): Payer: Medicare Other | Admitting: Obstetrics & Gynecology

## 2012-05-05 ENCOUNTER — Other Ambulatory Visit (HOSPITAL_COMMUNITY)
Admission: RE | Admit: 2012-05-05 | Discharge: 2012-05-05 | Disposition: A | Discharge status: Home / Self Care | Payer: Medicare Other | Source: Ambulatory Visit | Attending: Obstetrics & Gynecology | Admitting: Obstetrics & Gynecology

## 2012-05-05 ENCOUNTER — Encounter: Payer: Self-pay | Admitting: Obstetrics & Gynecology

## 2012-05-05 VITALS — BP 105/69 | HR 84 | Temp 97.0°F | Ht 65.0 in | Wt 158.8 lb

## 2012-05-05 DIAGNOSIS — N949 Unspecified condition associated with female genital organs and menstrual cycle: Secondary | ICD-10-CM

## 2012-05-05 DIAGNOSIS — Z113 Encounter for screening for infections with a predominantly sexual mode of transmission: Secondary | ICD-10-CM | POA: Insufficient documentation

## 2012-05-05 DIAGNOSIS — Z0000 Encounter for general adult medical examination without abnormal findings: Secondary | ICD-10-CM

## 2012-05-05 DIAGNOSIS — Z1151 Encounter for screening for human papillomavirus (HPV): Secondary | ICD-10-CM | POA: Insufficient documentation

## 2012-05-05 DIAGNOSIS — Z124 Encounter for screening for malignant neoplasm of cervix: Secondary | ICD-10-CM | POA: Insufficient documentation

## 2012-05-05 DIAGNOSIS — R102 Pelvic and perineal pain: Secondary | ICD-10-CM

## 2012-05-05 DIAGNOSIS — R300 Dysuria: Secondary | ICD-10-CM

## 2012-05-05 DIAGNOSIS — Z01419 Encounter for gynecological examination (general) (routine) without abnormal findings: Secondary | ICD-10-CM

## 2012-05-05 MED ORDER — ESTROGENS, CONJUGATED 0.625 MG/GM VA CREA: TOPICAL_CREAM | VAGINAL | Status: DC

## 2012-05-05 NOTE — Progress Notes (Signed)
  Subjective:    Patient ID: Nicole [name], female    DOB: 02-Dec-1960, 51 y.o.   MRN: [id number]  HPI  She is here because of vaginal pain/irritation. She initially treated a vaginal smell with a "bleach bath". It did not help and in fact, made her vaginal pain worse. She has been menopausal for about a year. She was treated with Keflex for "acute cystitis", although her UA was normal except for a small amount of blood.  Review of Systems Her pap and mammogram are due.    Objective:   Physical Exam  Moderate vaginal atrophy Stenotic cervix NSS RV, NT, no adnexal masses      Assessment & Plan:  Preventative care- pap today, schedule mammogram Vaginal pain- I suspect that this pain is due to atrophy so I will prescribe vaginal estrogen. I will also get a urine culture. She has an appt at the Urology office 05-22-12.

## 2012-05-05 NOTE — Addendum Note (Signed)
Addended by: Franchot Mimes on: 05/05/2012 04:46 PM   Modules accepted: Orders

## 2012-05-06 LAB — WET PREP, GENITAL: Yeast Wet Prep HPF POC: NONE SEEN

## 2012-05-07 LAB — URINE CULTURE
Colony Count: NO GROWTH
Organism ID, Bacteria: NO GROWTH

## 2012-05-12 ENCOUNTER — Telehealth: Payer: Self-pay | Admitting: General Practice

## 2012-05-12 NOTE — Telephone Encounter (Signed)
Pt called stating she wants the test results back from her urinalysis.

## 2012-05-12 NOTE — Telephone Encounter (Signed)
Patient returned call to clinic stating it is ok to leave results on her VM if she does not answer. Patient confirmed name, DOB and last 4 of SS#.

## 2012-05-12 NOTE — Telephone Encounter (Signed)
Called pt and left message that I am calling with test result information. Please call back and leave message of when she may be reached or if we can leave test results on her voice mail.

## 2012-05-12 NOTE — Telephone Encounter (Signed)
Called pt and informed her of test results: wet prep, urine culture and Pap all negative for abnormality.  Pt voiced understanding.

## 2012-05-16 ENCOUNTER — Ambulatory Visit: Payer: Medicare Other

## 2012-05-21 ENCOUNTER — Ambulatory Visit
Admission: RE | Admit: 2012-05-21 | Discharge: 2012-05-21 | Disposition: A | Discharge status: Home / Self Care | Payer: Medicare Other | Source: Ambulatory Visit | Attending: Obstetrics & Gynecology | Admitting: Obstetrics & Gynecology

## 2012-05-21 DIAGNOSIS — Z0000 Encounter for general adult medical examination without abnormal findings: Secondary | ICD-10-CM

## 2012-05-22 ENCOUNTER — Other Ambulatory Visit: Payer: Self-pay | Admitting: Obstetrics & Gynecology

## 2012-05-22 DIAGNOSIS — N644 Mastodynia: Secondary | ICD-10-CM

## 2012-10-27 ENCOUNTER — Emergency Department (HOSPITAL_COMMUNITY): Payer: Medicare Other

## 2012-10-27 ENCOUNTER — Encounter (HOSPITAL_COMMUNITY): Payer: Self-pay | Admitting: *Deleted

## 2012-10-27 ENCOUNTER — Emergency Department (HOSPITAL_COMMUNITY)
Admission: EM | Admit: 2012-10-27 | Discharge: 2012-10-27 | Disposition: A | Discharge status: Home / Self Care | Payer: Medicare Other | Attending: Emergency Medicine | Admitting: Emergency Medicine

## 2012-10-27 DIAGNOSIS — R0602 Shortness of breath: Secondary | ICD-10-CM | POA: Insufficient documentation

## 2012-10-27 DIAGNOSIS — Z862 Personal history of diseases of the blood and blood-forming organs and certain disorders involving the immune mechanism: Secondary | ICD-10-CM | POA: Insufficient documentation

## 2012-10-27 DIAGNOSIS — Z23 Encounter for immunization: Secondary | ICD-10-CM | POA: Insufficient documentation

## 2012-10-27 DIAGNOSIS — Z8679 Personal history of other diseases of the circulatory system: Secondary | ICD-10-CM | POA: Insufficient documentation

## 2012-10-27 DIAGNOSIS — R059 Cough, unspecified: Secondary | ICD-10-CM

## 2012-10-27 DIAGNOSIS — Z87891 Personal history of nicotine dependence: Secondary | ICD-10-CM | POA: Insufficient documentation

## 2012-10-27 DIAGNOSIS — R0989 Other specified symptoms and signs involving the circulatory and respiratory systems: Secondary | ICD-10-CM | POA: Insufficient documentation

## 2012-10-27 DIAGNOSIS — Z8659 Personal history of other mental and behavioral disorders: Secondary | ICD-10-CM | POA: Insufficient documentation

## 2012-10-27 LAB — BLOOD GAS, VENOUS
Acid-Base Excess: 1 mmol/L (ref 0.0–2.0)
O2 Saturation: 99.1 %
Patient temperature: 98.6
TCO2: 22.2 mmol/L (ref 0–100)

## 2012-10-27 LAB — COMPREHENSIVE METABOLIC PANEL
CO2: 25 mEq/L (ref 19–32)
Calcium: 8.9 mg/dL (ref 8.4–10.5)
Creatinine, Ser: 0.82 mg/dL (ref 0.50–1.10)
GFR calc Af Amer: >90 mL/min (ref >90)
GFR calc non Af Amer: 80 mL/min — ABNORMAL LOW (ref >90)
Glucose, Bld: 112 mg/dL — ABNORMAL HIGH (ref 70–99)
Total Protein: 6.6 g/dL (ref 6.0–8.3)

## 2012-10-27 LAB — CARBOXYHEMOGLOBIN
Carboxyhemoglobin: 1.2 % (ref 0.5–1.5)
Methemoglobin: 1 % (ref 0.0–1.5)
O2 Saturation: 98.8 %

## 2012-10-27 LAB — CBC WITH DIFFERENTIAL/PLATELET
Basophils Absolute: 0 10*3/uL (ref 0.0–0.1)
Eosinophils Absolute: 0.2 10*3/uL (ref 0.0–0.7)
Eosinophils Relative: 3 % (ref 0–5)
HCT: 32.8 % — ABNORMAL LOW (ref 36.0–46.0)
Lymphocytes Relative: 52 % — ABNORMAL HIGH (ref 12–46)
Lymphs Abs: 2.7 10*3/uL (ref 0.7–4.0)
MCH: 30.4 pg (ref 26.0–34.0)
MCV: 90.6 fL (ref 78.0–100.0)
Monocytes Absolute: 0.5 10*3/uL (ref 0.1–1.0)
Platelets: 192 10*3/uL (ref 150–400)
RDW: 13 % (ref 11.5–15.5)
WBC: 5.1 10*3/uL (ref 4.0–10.5)

## 2012-10-27 MED ORDER — TETANUS-DIPHTH-ACELL PERTUSSIS 5-2.5-18.5 LF-MCG/0.5 IM SUSP
0.5000 mL | Freq: Once | INTRAMUSCULAR | Status: AC
  Administered 2012-10-27: 0.5 mL via INTRAMUSCULAR
  Filled 2012-10-27: qty 0.5

## 2012-10-27 MED ORDER — ALBUTEROL SULFATE HFA 108 (90 BASE) MCG/ACT IN AERS: 1.0000 | INHALATION_SPRAY | Freq: Four times a day (QID) | RESPIRATORY_TRACT | Status: DC | PRN

## 2012-10-27 MED ORDER — MUCINEX DM 30-600 MG PO TB12: 1.0000 | ORAL_TABLET | Freq: Two times a day (BID) | ORAL | Status: DC

## 2012-10-27 NOTE — ED Notes (Signed)
Pt states she has sob and chest congestion,  Denies chest pain,  States her lungs hurt
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
## 2012-10-27 NOTE — ED Notes (Signed)
Pt would also like tetanus shot,  States she stuck a staple underneath skin

## 2012-10-27 NOTE — ED Notes (Signed)
Patient transported to X-ray 

## 2012-11-05 ENCOUNTER — Ambulatory Visit: Payer: Medicare Other | Admitting: Obstetrics & Gynecology

## 2012-12-24 ENCOUNTER — Ambulatory Visit (INDEPENDENT_AMBULATORY_CARE_PROVIDER_SITE_OTHER): Payer: Medicare Other | Admitting: Obstetrics & Gynecology

## 2012-12-24 ENCOUNTER — Other Ambulatory Visit: Payer: Self-pay | Admitting: Obstetrics & Gynecology

## 2012-12-24 ENCOUNTER — Encounter: Payer: Self-pay | Admitting: Obstetrics & Gynecology

## 2012-12-24 VITALS — BP 102/69 | HR 83 | Temp 98.0°F | Ht 65.0 in | Wt 186.8 lb

## 2012-12-24 DIAGNOSIS — R102 Pelvic and perineal pain: Secondary | ICD-10-CM

## 2012-12-24 DIAGNOSIS — N952 Postmenopausal atrophic vaginitis: Secondary | ICD-10-CM

## 2012-12-24 DIAGNOSIS — E669 Obesity, unspecified: Secondary | ICD-10-CM

## 2012-12-24 DIAGNOSIS — N949 Unspecified condition associated with female genital organs and menstrual cycle: Secondary | ICD-10-CM

## 2012-12-24 DIAGNOSIS — R635 Abnormal weight gain: Secondary | ICD-10-CM

## 2012-12-24 NOTE — Progress Notes (Signed)
  Subjective:    Patient ID: Nicole [name], female    DOB: Nov 30, 1960, 52 y.o.   MRN: [id number]  HPI  52 yo AA lady who is here today because of unchanged vaginal pain. I prescribed vaginal estrogen when I saw her 9/13. She was unable to afford to buy it and therefore is still untreated. She also complains that she seems to be unable to lose weight.  Review of Systems Her pap and mammogram are UTD and normal.    Objective:   Physical Exam        Assessment & Plan:  Vulvovaginal atrophy causing pain- I have spoken with a pharmacist at [facility] who will compound vaginal estrogen cream in a dose equivilent to Premarin cream. She will use some every other night.

## 2012-12-25 LAB — TSH: TSH: 0.944 u[IU]/mL (ref 0.350–4.500)

## 2012-12-26 ENCOUNTER — Encounter: Payer: Self-pay | Admitting: Obstetrics & Gynecology

## 2013-01-06 ENCOUNTER — Ambulatory Visit: Payer: Medicare Other | Admitting: Family Medicine

## 2013-01-26 ENCOUNTER — Ambulatory Visit: Payer: Medicare Other | Admitting: Family Medicine

## 2013-04-16 ENCOUNTER — Encounter: Payer: Self-pay | Admitting: Cardiology

## 2013-04-16 ENCOUNTER — Ambulatory Visit (INDEPENDENT_AMBULATORY_CARE_PROVIDER_SITE_OTHER): Payer: Medicare Other | Admitting: Cardiology

## 2013-04-16 VITALS — BP 118/72 | HR 64 | Ht 65.0 in | Wt 188.0 lb

## 2013-04-16 DIAGNOSIS — R002 Palpitations: Secondary | ICD-10-CM

## 2013-04-16 NOTE — Patient Instructions (Signed)
No medication changes were made today

## 2013-04-16 NOTE — Assessment & Plan Note (Signed)
Resolved. No further cardiac workup for followup recommended. I have advised her to get a primary care physician.

## 2013-04-16 NOTE — Progress Notes (Signed)
HPI Nicole [name] returns today for the evaluation and management of a history of palpitations. She is no longer having these. She did quit smoking. Unfortunately she is gaining some weight. She does walk on a daily basis. She denies any chest pain presyncope or syncope. She does not have a primary care physician.  Past Medical History  Diagnosis Date  . Perimenopause   . Palpitations     negative cardiac w/u per Dr. Daleen Squibb 2/09  . Anemia   . Depression     bipolar  . History of migraine headaches     headaches reported as migraines    No current outpatient prescriptions on file.   No current facility-administered medications for this visit.    Allergies  Allergen Reactions  . Other Itching    Pain medication (Patient unsure of medication name)    Family History  Problem Relation Age of Onset  . Hypertension Mother   . Diabetes Mother   . Heart disease Mother   . Thyroid disease Mother   . Breast cancer Maternal Aunt   . Pancreatic cancer Maternal Uncle   . Pancreatic cancer Other     family history  . Colon cancer Neg Hx     History   Social History  . Marital Status: Single    Spouse Name: N/A    Number of Children: 3  . Years of Education: N/A   Occupational History  . 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 Smoking status: Former Smoker    Quit date: 07/20/2011  . Smokeless tobacco: Never Used     Comment: Few  . Alcohol Use: Not on file     Comment: Rare glass of wine  . Drug Use: No     Comment: Rare marijuana; has been counseled to quit tobacco and marijuana  . Sexual Activity: Not on file   Other Topics Concern  . Not on file   Social History Narrative  . No narrative on file    ROS ALL NEGATIVE EXCEPT THOSE NOTED IN HPI  PE  General Appearance: well developed, well nourished in no acute distress, overweight HEENT: symmetrical face, PERRLA, good dentition  Neck: no JVD, thyromegaly, or adenopathy,  trachea midline Chest: symmetric without deformity Cardiac: PMI non-displaced, RRR, normal S1, S2, no gallop or murmur Lung: clear to ausculation and percussion Vascular: all pulses full without bruits  Abdominal: nondistended, nontender, good bowel sounds, no HSM, no bruits Extremities: no cyanosis, clubbing or edema, no sign of DVT, no varicosities  Skin: normal color, no rashes Neuro: alert and oriented x 3, non-focal Pysch: normal affect  EKG Sinus tachycardia, otherwise normal EKG BMET    Component Value Date/Time   NA 138 10/27/2012 2127   K 3.9 10/27/2012 2127   CL 104 10/27/2012 2127   CO2 25 10/27/2012 2127   GLUCOSE 112* 10/27/2012 2127   BUN 14 10/27/2012 2127   CREATININE 0.82 10/27/2012 2127   CALCIUM 8.9 10/27/2012 2127   GFRNONAA 80* 10/27/2012 2127   GFRAA >90 10/27/2012 2127    Lipid Panel     Component Value Date/Time   CHOL 200 01/04/2010 2112   TRIG 66 01/04/2010 2112   HDL 64 01/04/2010 2112   CHOLHDL 3.1 Ratio 01/04/2010 2112   VLDL 13 01/04/2010 2112   LDLCALC 123* 01/04/2010 2112    CBC    Component Value Date/Time   WBC 5.1 10/27/2012 2127  RBC 3.62* 10/27/2012 2127   HGB 11.0* 10/27/2012 2127   HCT 32.8* 10/27/2012 2127   PLT 192 10/27/2012 2127   MCV 90.6 10/27/2012 2127   MCH 30.4 10/27/2012 2127   MCHC 33.5 10/27/2012 2127   RDW 13.0 10/27/2012 2127   LYMPHSABS 2.7 10/27/2012 2127   MONOABS 0.5 10/27/2012 2127   EOSABS 0.2 10/27/2012 2127   BASOSABS 0.0 10/27/2012 2127

## 2013-04-22 ENCOUNTER — Ambulatory Visit: Payer: Medicare Other

## 2013-06-01 ENCOUNTER — Emergency Department (HOSPITAL_COMMUNITY)
Admission: EM | Admit: 2013-06-01 | Discharge: 2013-06-01 | Disposition: A | Discharge status: Home / Self Care | Payer: Medicare Other | Attending: Emergency Medicine | Admitting: Emergency Medicine

## 2013-06-01 ENCOUNTER — Encounter (HOSPITAL_COMMUNITY): Payer: Self-pay | Admitting: Emergency Medicine

## 2013-06-01 DIAGNOSIS — M545 Low back pain, unspecified: Secondary | ICD-10-CM | POA: Insufficient documentation

## 2013-06-01 DIAGNOSIS — Z8679 Personal history of other diseases of the circulatory system: Secondary | ICD-10-CM | POA: Insufficient documentation

## 2013-06-01 DIAGNOSIS — Z87891 Personal history of nicotine dependence: Secondary | ICD-10-CM | POA: Insufficient documentation

## 2013-06-01 DIAGNOSIS — Z8659 Personal history of other mental and behavioral disorders: Secondary | ICD-10-CM | POA: Insufficient documentation

## 2013-06-01 DIAGNOSIS — R300 Dysuria: Secondary | ICD-10-CM | POA: Insufficient documentation

## 2013-06-01 DIAGNOSIS — R319 Hematuria, unspecified: Secondary | ICD-10-CM | POA: Insufficient documentation

## 2013-06-01 DIAGNOSIS — Z862 Personal history of diseases of the blood and blood-forming organs and certain disorders involving the immune mechanism: Secondary | ICD-10-CM | POA: Insufficient documentation

## 2013-06-01 DIAGNOSIS — M549 Dorsalgia, unspecified: Secondary | ICD-10-CM

## 2013-06-01 LAB — CBC WITH DIFFERENTIAL/PLATELET
Basophils Absolute: 0 10*3/uL (ref 0.0–0.1)
Basophils Relative: 0 % (ref 0–1)
Eosinophils Relative: 4 % (ref 0–5)
HCT: 35.2 % — ABNORMAL LOW (ref 36.0–46.0)
MCHC: 33.2 g/dL (ref 30.0–36.0)
Monocytes Absolute: 0.4 10*3/uL (ref 0.1–1.0)
Neutro Abs: 2.3 10*3/uL (ref 1.7–7.7)
Platelets: 218 10*3/uL (ref 150–400)
RDW: 13.5 % (ref 11.5–15.5)

## 2013-06-01 LAB — BASIC METABOLIC PANEL
Calcium: 9.2 mg/dL (ref 8.4–10.5)
Chloride: 105 mEq/L (ref 96–112)
Creatinine, Ser: 0.9 mg/dL (ref 0.50–1.10)
GFR calc Af Amer: 83 mL/min — ABNORMAL LOW (ref >90)

## 2013-06-01 LAB — URINALYSIS, ROUTINE W REFLEX MICROSCOPIC
Glucose, UA: NEGATIVE mg/dL
Leukocytes, UA: NEGATIVE
Protein, ur: NEGATIVE mg/dL
Specific Gravity, Urine: 1.023 (ref 1.005–1.030)
pH: 6.5 (ref 5.0–8.0)

## 2013-06-01 LAB — URINE MICROSCOPIC-ADD ON

## 2013-06-01 MED ORDER — ACETAMINOPHEN 325 MG PO TABS
650.0000 mg | ORAL_TABLET | Freq: Once | ORAL | Status: AC
  Administered 2013-06-01: 650 mg via ORAL
  Filled 2013-06-01: qty 2

## 2013-06-01 MED ORDER — OXYCODONE-ACETAMINOPHEN 5-325 MG PO TABS: 1.0000 | ORAL_TABLET | Freq: Three times a day (TID) | ORAL | Status: DC | PRN

## 2013-06-01 NOTE — ED Notes (Signed)
Pt is here with blood in urine for one week and was being treated with CIPRO.  Pt states symptoms no better.  Pt reports lower back pain.  Pt also wants to get chest xray that done that she was originally sent here for

## 2013-06-01 NOTE — ED Notes (Signed)
Discharge instructions reviewed. Pt verbalized understanding.  

## 2013-06-01 NOTE — ED Provider Notes (Signed)
CSN: [id number]     Arrival date & time 06/01/13  1538 History   First MD Initiated Contact with Patient 06/01/13 1722     Chief Complaint  Patient presents with  . Hematuria   (Consider location/radiation/quality/duration/timing/severity/associated sxs/prior Treatment) Patient is a 52 y.o. female presenting with hematuria. The history is provided by the patient.  Hematuria This is a new problem. The current episode started more than 1 week ago. The problem occurs constantly. The problem has not changed since onset.Pertinent negatives include no chest pain, no abdominal pain, no headaches and no shortness of breath. Nothing aggravates the symptoms. Nothing relieves the symptoms. She has tried nothing for the symptoms. The treatment provided no relief.    Past Medical History  Diagnosis Date  . Perimenopause   . Palpitations     negative cardiac w/u per Dr. Daleen Squibb 2/09  . Anemia   . Depression     bipolar  . History of migraine headaches     headaches reported as migraines   Past Surgical History  Procedure Laterality Date  . Tubal ligation  1987  . Exploratory laparotomy  2000    x2 (Dr. Dierdre Forth)  . Herpes simplex virus dfa     Family History  Problem Relation Age of Onset  . Hypertension Mother   . Diabetes Mother   . Heart disease Mother   . Thyroid disease Mother   . Breast cancer Maternal Aunt   . Pancreatic cancer Maternal Uncle   . Pancreatic cancer Other     family history  . Colon cancer Neg Hx    History  Substance Use Topics  . Smoking status: Former Smoker    Quit date: 07/20/2011  . Smokeless tobacco: Never Used     Comment: Few  . Alcohol Use: No     Comment: Rare glass of wine   OB History   Grav Para Term Preterm Abortions TAB SAB Ect Mult Living                 Review of Systems  Constitutional: Negative for fever and fatigue.  HENT: Negative for congestion and drooling.   Eyes: Negative for pain.  Respiratory: Negative for cough and  shortness of breath.   Cardiovascular: Negative for chest pain.  Gastrointestinal: Negative for nausea, vomiting, abdominal pain and diarrhea.  Genitourinary: Positive for hematuria. Negative for dysuria.  Musculoskeletal: Positive for back pain. Negative for gait problem and neck pain.  Skin: Negative for color change.  Neurological: Negative for dizziness and headaches.  Hematological: Negative for adenopathy.  Psychiatric/Behavioral: Negative for behavioral problems.  All other systems reviewed and are negative.    Allergies  Other  Home Medications  No current outpatient prescriptions on file. BP 115/84  Pulse 92  Temp(Src) 97.6 F (36.4 C) (Oral)  Resp 18  Wt 189 lb 3 oz (85.815 kg)  BMI 31.48 kg/m2  SpO2 97%  LMP 02/17/2011 Physical Exam  Nursing note and vitals reviewed. Constitutional: She is oriented to person, place, and time. She appears well-developed and well-nourished.  HENT:  Head: Normocephalic.  Mouth/Throat: Oropharynx is clear and moist. No oropharyngeal exudate.  Eyes: Conjunctivae and EOM are normal. Pupils are equal, round, and reactive to light.  Neck: Normal range of motion. Neck supple.  Cardiovascular: Normal rate, regular rhythm, normal heart sounds and intact distal pulses.  Exam reveals no gallop and no friction rub.   No murmur heard. Pulmonary/Chest: Effort normal and breath sounds normal. No respiratory distress.  She has no wheezes.  Abdominal: Soft. Bowel sounds are normal. There is no tenderness. There is no rebound and no guarding.  Musculoskeletal: Normal range of motion. She exhibits no edema and no tenderness.  No focal ttp of back.   Neurological: She is alert and oriented to person, place, and time.  Skin: Skin is warm and dry.  Psychiatric: She has a normal mood and affect. Her behavior is normal.    ED Course  Procedures (including critical care time) Labs Review Labs Reviewed  URINALYSIS, ROUTINE W REFLEX MICROSCOPIC -  Abnormal; Notable for the following:    Hgb urine dipstick SMALL (*)    All other components within normal limits  URINE MICROSCOPIC-ADD ON - Abnormal; Notable for the following:    Squamous Epithelial / LPF FEW (*)    All other components within normal limits  CBC WITH DIFFERENTIAL  BASIC METABOLIC PANEL   Imaging Review No results found.  EKG Interpretation   None       MDM   1. Hematuria   2. Back pain    5:52 PM 52 y.o. female who presents with hematuria and mild dysuria and low back pain for approximately a week and a half. She denies any fevers or vomiting or diarrhea. She was seen in urgent care several days ago and has just finished a 3 day prescription for Cipro. She notes continued symptoms. She is a very small amount of blood noted in her urine here today. The patient is worried that something is wrong with her. Will get screening labwork. If noncontributory will recommend followup with urology.  7:49 PM: Pt to f/u w/ bladder specialist she has seen in the past. No UTI seen on UA today. Will provide small Rx for pain medicine d/t back pain.  I have discussed the diagnosis/risks/treatment options with the patient and believe the pt to be eligible for discharge home to follow-up with her urologist. We also discussed returning to the ED immediately if new or worsening sx occur. We discussed the sx which are most concerning (e.g., worsening pain, fever) that necessitate immediate return. Any new prescriptions provided to the patient are listed below.  Discharge Medication List as of 06/01/2013  7:51 PM    START taking these medications   Details  oxyCODONE-acetaminophen (PERCOCET) 5-325 MG per tablet Take 1 tablet by mouth every 8 (eight) hours as needed for pain., Starting 06/01/2013, Until Discontinued, Print         Junius Argyle, MD 06/02/13 1121

## 2013-06-02 ENCOUNTER — Ambulatory Visit (HOSPITAL_COMMUNITY)
Admission: RE | Admit: 2013-06-02 | Discharge: 2013-06-02 | Disposition: A | Discharge status: Home / Self Care | Payer: Medicare Other | Source: Ambulatory Visit | Attending: Internal Medicine | Admitting: Internal Medicine

## 2013-06-02 ENCOUNTER — Other Ambulatory Visit (HOSPITAL_COMMUNITY): Payer: Self-pay | Admitting: Internal Medicine

## 2013-06-02 DIAGNOSIS — R0602 Shortness of breath: Secondary | ICD-10-CM

## 2013-07-18 ENCOUNTER — Emergency Department (HOSPITAL_COMMUNITY): Payer: Medicare Other

## 2013-07-18 ENCOUNTER — Emergency Department (HOSPITAL_COMMUNITY)
Admission: EM | Admit: 2013-07-18 | Discharge: 2013-07-18 | Disposition: A | Discharge status: Home / Self Care | Payer: Medicare Other | Attending: Emergency Medicine | Admitting: Emergency Medicine

## 2013-07-18 ENCOUNTER — Encounter (HOSPITAL_COMMUNITY): Payer: Self-pay | Admitting: Emergency Medicine

## 2013-07-18 DIAGNOSIS — R0982 Postnasal drip: Secondary | ICD-10-CM

## 2013-07-18 DIAGNOSIS — Z87891 Personal history of nicotine dependence: Secondary | ICD-10-CM | POA: Insufficient documentation

## 2013-07-18 DIAGNOSIS — Z862 Personal history of diseases of the blood and blood-forming organs and certain disorders involving the immune mechanism: Secondary | ICD-10-CM | POA: Insufficient documentation

## 2013-07-18 DIAGNOSIS — Z8659 Personal history of other mental and behavioral disorders: Secondary | ICD-10-CM | POA: Insufficient documentation

## 2013-07-18 DIAGNOSIS — Z8742 Personal history of other diseases of the female genital tract: Secondary | ICD-10-CM | POA: Insufficient documentation

## 2013-07-18 DIAGNOSIS — Z8679 Personal history of other diseases of the circulatory system: Secondary | ICD-10-CM | POA: Insufficient documentation

## 2013-07-18 DIAGNOSIS — R599 Enlarged lymph nodes, unspecified: Secondary | ICD-10-CM | POA: Insufficient documentation

## 2013-07-18 DIAGNOSIS — J029 Acute pharyngitis, unspecified: Secondary | ICD-10-CM | POA: Insufficient documentation

## 2013-07-18 LAB — RAPID STREP SCREEN (MED CTR MEBANE ONLY): Streptococcus, Group A Screen (Direct): NEGATIVE

## 2013-07-18 NOTE — ED Provider Notes (Signed)
CSN: [id number]     Arrival date & time 07/18/13  1715 History  This chart was scribed for non-physician practitioner Arthor Captain, PA-C, working with Gilda Crease, MD, by Yevette Edwards, ED Scribe. This patient was seen in room WTR6/WTR6 and the patient's care was started at [facility] PM.     Chief Complaint  Patient presents with  . Sore Throat  . Cough    The history is provided by the patient. No language interpreter was used.   HPI Comments: Nicole [name] is a 52 y.o. female who presents to the Emergency Department complaining of a sore throat for 5x days, with associated bloody sputum. Pt has not taken anything for the pain or sputum production. She denies fever, chills, weight loss, ear pain, CP, SOB, difficulty breathing. She also complains of back pain with breathing. Pt states that the pain feels the same as a previous occurrence of strep throat.  She has been trying to get in to see her ENT doctor, Dr. Ellie Lunch,  because of recurrent throat pain. Pt denies a hx of blood clots. She does not appear to be in any acute distress with no other complaints.  PCP: Dr. Philipp Deputy Past Medical History  Diagnosis Date  . Perimenopause   . Palpitations     negative cardiac w/u per Dr. Daleen Squibb 2/09  . Anemia   . Depression     bipolar  . History of migraine headaches     headaches reported as migraines   Past Surgical History  Procedure Laterality Date  . Tubal ligation  1987  . Exploratory laparotomy  2000    x2 (Dr. Dierdre Forth)  . Herpes simplex virus dfa     Family History  Problem Relation Age of Onset  . Hypertension Mother   . Diabetes Mother   . Heart disease Mother   . Thyroid disease Mother   . Breast cancer Maternal Aunt   . Pancreatic cancer Maternal Uncle   . Pancreatic cancer Other     family history  . Colon cancer Neg Hx    History  Substance Use Topics  . Smoking status: Former Smoker    Quit date: 07/20/2011  . Smokeless tobacco: Never Used   Comment: Few  . Alcohol Use: No     Comment: Rare glass of wine   No OB history provided.  Review of Systems  HENT: Positive for postnasal drip and sore throat.   Respiratory: Positive for cough.   Gastrointestinal: Negative for nausea, vomiting and diarrhea.  All other systems reviewed and are negative.    Allergies  Other  Home Medications   Current Outpatient Rx  Name  Route  Sig  Dispense  Refill  . oxyCODONE-acetaminophen (PERCOCET) 5-325 MG per tablet   Oral   Take 1 tablet by mouth every 8 (eight) hours as needed for pain.   10 tablet   0    Triage Vitals: BP 123/72  Pulse 98  Temp(Src) 98.7 F (37.1 C) (Oral)  Resp 18  SpO2 100%  LMP 02/17/2011  Physical Exam  Nursing note and vitals reviewed. Constitutional: She is oriented to person, place, and time. She appears well-developed and well-nourished. No distress.  HENT:  Head: Normocephalic and atraumatic.  Mouth/Throat: Oropharynx is clear and moist. No oropharyngeal exudate or posterior oropharyngeal erythema.  Eyes: EOM are normal.  Neck: Neck supple. No tracheal deviation present.  Cardiovascular: Normal rate.   Pulmonary/Chest: Effort normal. No respiratory distress.  Musculoskeletal: Normal range of  motion.  Lymphadenopathy:    She has cervical adenopathy (bilateral).  Neurological: She is alert and oriented to person, place, and time.  Skin: Skin is warm and dry.  Psychiatric: She has a normal mood and affect. Her behavior is normal.    ED Course  Procedures (including critical care time)  DIAGNOSTIC STUDIES: Oxygen Saturation is 100% on room air, normal by my interpretation.    COORDINATION OF CARE:  6:17 PM- Discussed treatment plan with patient including chest X-ray, throat swab, and referral to ENT doctor and the patient agreed to the plan.   Labs Review Labs Reviewed - No data to display Imaging Review No results found.  EKG Interpretation   None       MDM   1. Post-nasal  drip    Patient work up negative for acute abnormality. sxs likely due to post nasal drip. Encourage saltwater gargle. F/u with pcp.   I personally performed the services described in this documentation, which was scribed in my presence. The recorded information has been reviewed and is accurate.       Arthor Captain, PA-C 07/18/13 1949

## 2013-07-18 NOTE — ED Notes (Signed)
The patient reports that she has a cough and a sore throat.

## 2013-07-20 NOTE — ED Provider Notes (Signed)
Medical screening examination/treatment/procedure(s) were performed by non-physician practitioner and as supervising physician I was immediately available for consultation/collaboration.  EKG Interpretation   None         Maudy Yonan J. Chayanne Speir, MD 07/20/13 1625 

## 2013-09-02 ENCOUNTER — Ambulatory Visit: Payer: Medicare Other | Admitting: Family Medicine

## 2013-09-07 ENCOUNTER — Ambulatory Visit: Payer: Medicare Other | Admitting: Diagnostic Neuroimaging

## 2013-09-14 ENCOUNTER — Ambulatory Visit (INDEPENDENT_AMBULATORY_CARE_PROVIDER_SITE_OTHER): Payer: Medicare Other | Admitting: Diagnostic Neuroimaging

## 2013-09-14 ENCOUNTER — Encounter (INDEPENDENT_AMBULATORY_CARE_PROVIDER_SITE_OTHER): Payer: Self-pay

## 2013-09-14 ENCOUNTER — Encounter: Payer: Self-pay | Admitting: Diagnostic Neuroimaging

## 2013-09-14 VITALS — BP 96/67 | HR 73 | Temp 98.1°F | Ht 65.5 in | Wt 192.0 lb

## 2013-09-14 DIAGNOSIS — G43009 Migraine without aura, not intractable, without status migrainosus: Secondary | ICD-10-CM

## 2013-09-14 MED ORDER — SUMATRIPTAN SUCCINATE 100 MG PO TABS: 100.0000 mg | ORAL_TABLET | Freq: Once | ORAL | Status: DC | PRN

## 2013-09-14 NOTE — Patient Instructions (Signed)
Migraine Headache A migraine headache is an intense, throbbing pain on one or both sides of your head. A migraine can last for 30 minutes to several hours. CAUSES  The exact cause of a migraine headache is not always known. However, a migraine may be caused when nerves in the brain become irritated and release chemicals that cause inflammation. This causes pain. Certain things may also trigger migraines, such as:  Alcohol.  Smoking.  Stress.  Menstruation.  Aged cheeses.  Foods or drinks that contain nitrates, glutamate, aspartame, or tyramine.  Lack of sleep.  Chocolate.  Caffeine.  Hunger.  Physical exertion.  Fatigue.  Medicines used to treat chest pain (nitroglycerine), birth control pills, estrogen, and some blood pressure medicines. SIGNS AND SYMPTOMS  Pain on one or both sides of your head.  Pulsating or throbbing pain.  Severe pain that prevents daily activities.  Pain that is aggravated by any physical activity.  Nausea, vomiting, or both.  Dizziness.  Pain with exposure to bright lights, loud noises, or activity.  General sensitivity to bright lights, loud noises, or smells. Before you get a migraine, you may get warning signs that a migraine is coming (aura). An aura may include:  Seeing flashing lights.  Seeing bright spots, halos, or zig-zag lines.  Having tunnel vision or blurred vision.  Having feelings of numbness or tingling.  Having trouble talking.  Having muscle weakness. DIAGNOSIS  A migraine headache is often diagnosed based on:  Symptoms.  Physical exam.  A CT scan or MRI of your head. These imaging tests cannot diagnose migraines, but they can help rule out other causes of headaches. TREATMENT Medicines may be given for pain and nausea. Medicines can also be given to help prevent recurrent migraines.  HOME CARE INSTRUCTIONS  Only take over-the-counter or prescription medicines for pain or discomfort as directed by your  health care provider. The use of long-term narcotics is not recommended.  Lie down in a dark, quiet room when you have a migraine.  Keep a journal to find out what may trigger your migraine headaches. For example, write down:  What you eat and drink.  How much sleep you get.  Any change to your diet or medicines.  Limit alcohol consumption.  Quit smoking if you smoke.  Get 7 9 hours of sleep, or as recommended by your health care provider.  Limit stress.  Keep lights dim if bright lights bother you and make your migraines worse. SEEK IMMEDIATE MEDICAL CARE IF:   Your migraine becomes severe.  You have a fever.  You have a stiff neck.  You have vision loss.  You have muscular weakness or loss of muscle control.  You start losing your balance or have trouble walking.  You feel faint or pass out.  You have severe symptoms that are different from your first symptoms. MAKE SURE YOU:   Understand these instructions.  Will watch your condition.  Will get help right away if you are not doing well or get worse. Document Released: 08/06/2005 Document Revised: 05/27/2013 Document Reviewed: 04/13/2013 Exit[employer] Patient Information 2014 [employer].  

## 2013-09-14 NOTE — Progress Notes (Signed)
[facility]  PATIENT: Nicole [name] DOB: 1961/08/10  REFERRING CLINICIAN: Luanna Salk NP HISTORY FROM: patient  REASON FOR VISIT: new consult   HISTORICAL  CHIEF COMPLAINT:  Chief Complaint  Patient presents with  . Migraine    HISTORY OF PRESENT ILLNESS:   53 year old right-handed female here for evaluation of headaches.  Patient does have headaches since age 71 years old, yearly headaches, with frontal, unilateral sometimes bilateral throbbing pressure sensation. Sometimes associated with stomach pain, photophobia and phonophobia. No nausea or vomiting. Over her life, in the last one to 2 years symptoms have changed. Now she has pinching and hair pulling sensation of the back of her head. These are more significant during periods of stress. From October to December 2014 patient was under higher stress with worse symptoms. In January 2015 symptoms have significantly improved.  Patient has tried oxycodone/Tylenol in the past with mild relief. She's not been officially diagnosed with migraine in the past. She's not officially taken migraine medication in the past. No family history of migraine.  REVIEW OF SYSTEMS: Full 14 system review of systems performed and notable only for insomnia sleepiness snoring headache dizziness depression anxiety not asleep urinary problems blood in urine moles weight gain blurred vision eye pain and anemia.  ALLERGIES: Allergies  Allergen Reactions  . Other Itching    Pain medication (Patient unsure of medication name)    HOME MEDICATIONS: Outpatient Prescriptions Prior to Visit  Medication Sig Dispense Refill  . oxyCODONE-acetaminophen (PERCOCET) 5-325 MG per tablet Take 1 tablet by mouth every 8 (eight) hours as needed for pain.  10 tablet  0   No facility-administered medications prior to visit.    PAST MEDICAL HISTORY: Past Medical History  Diagnosis Date  . Perimenopause   . Palpitations     negative cardiac w/u per  Dr. Verl Blalock 2/09  . Anemia   . Depression     bipolar  . History of migraine headaches     headaches reported as migraines    PAST SURGICAL HISTORY: Past Surgical History  Procedure Laterality Date  . Tubal ligation  1987  . Exploratory laparotomy  2000    x2 (Dr. Kendall Flack)  . Herpes simplex virus dfa      FAMILY HISTORY: Family History  Problem Relation Age of Onset  . Hypertension Mother   . Diabetes Mother   . Heart disease Mother   . Thyroid disease Mother   . Breast cancer Maternal Aunt   . Pancreatic cancer Maternal Uncle   . Pancreatic cancer Other     family history  . Colon cancer Neg Hx     SOCIAL HISTORY:  History   Social History  . Marital Status: Single    Spouse Name: N/A    Number of Children: 3  . Years of Education: N/A   Occupational History  . 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 Smoking status: Former Smoker -- 0.60 packs/day for 30 years    Types: Cigarettes    Quit date: 07/20/2011  . Smokeless tobacco: Never Used     Comment: Few  . Alcohol Use: No     Comment: Rare glass of wine  . Drug Use: No     Comment: Rare marijuana; has been counseled to quit tobacco and marijuana  . Sexual Activity: Not on file   Other Topics Concern  . Not on file   Social History  Narrative   Patient lives at home alone.   Caffeine Use: none     PHYSICAL EXAM  Filed Vitals:   09/14/13 1034  BP: 96/67  Pulse: 73  Temp: 98.1 F (36.7 C)  TempSrc: Oral  Height: 5' 5.5" (1.664 m)  Weight: 192 lb (87.091 kg)    Not recorded    Body mass index is 31.45 kg/(m^2).  GENERAL EXAM: Patient is in no distress; well developed, nourished and groomed; neck is supple  CARDIOVASCULAR: Regular rate and rhythm, no murmurs, no carotid bruits  NEUROLOGIC: MENTAL STATUS: awake, alert, oriented to person, place and time, recent and remote memory intact, normal attention and concentration, language  fluent, comprehension intact, naming intact, fund of knowledge appropriate CRANIAL NERVE: no papilledema on fundoscopic exam, pupils equal and reactive to light, visual fields full to confrontation, extraocular muscles intact, no nystagmus, facial sensation and strength symmetric, hearing intact, palate elevates symmetrically, uvula midline, shoulder shrug symmetric, tongue midline. MOTOR: normal bulk and tone, full strength in the BUE, BLE SENSORY: normal and symmetric to light touch, temperature, vibration COORDINATION: finger-nose-finger, fine finger movements normal REFLEXES: deep tendon reflexes present and symmetric GAIT/STATION: narrow based gait; able to walk tandem; romberg is negative    DIAGNOSTIC DATA (LABS, IMAGING, TESTING) - I reviewed patient records, labs, notes, testing and imaging myself where available.  Lab Results  Component Value Date   WBC 6.3 06/01/2013   HGB 11.7* 06/01/2013   HCT 35.2* 06/01/2013   MCV 89.3 06/01/2013   PLT 218 06/01/2013      Component Value Date/Time   NA 140 06/01/2013 1903   K 4.0 06/01/2013 1903   CL 105 06/01/2013 1903   CO2 24 06/01/2013 1903   GLUCOSE 104* 06/01/2013 1903   BUN 15 06/01/2013 1903   CREATININE 0.90 06/01/2013 1903   CALCIUM 9.2 06/01/2013 1903   PROT 6.6 10/27/2012 2127   ALBUMIN 3.6 10/27/2012 2127   AST 12 10/27/2012 2127   ALT 12 10/27/2012 2127   ALKPHOS 59 10/27/2012 2127   BILITOT 0.2* 10/27/2012 2127   GFRNONAA 72* 06/01/2013 1903   GFRAA 83* 06/01/2013 1903   Lab Results  Component Value Date   CHOL 200 01/04/2010   HDL 64 01/04/2010   LDLCALC 123* 01/04/2010   TRIG 66 01/04/2010   CHOLHDL 3.1 Ratio 01/04/2010   Lab Results  Component Value Date   HGBA1C 5.8* 12/24/2012   Lab Results  Component Value Date   VITAMINB12 230 12/28/2009   Lab Results  Component Value Date   TSH 0.944 12/24/2012    I reviewed images myself and agree with interpretation.   06/24/08 CT HEAD - normal   ASSESSMENT AND  PLAN  53 y.o. year old female here with migraine without aura since age 11-58 years old. Symptoms worse with stress. Symptoms changing slightly in last 1-2 years, but improving in the last 1 month. Will try triptan.   PLAN: - sumatriptan prn    Return in about 3 months (around 12/13/2013) for with Charlott Holler or Kelilah Hebard.    Penni Bombard, MD 1/61/0960, 45:40 PM Certified in Neurology, Neurophysiology and Neuroimaging  [facility] [address] [place], [place] [address] [phone]

## 2013-09-16 ENCOUNTER — Other Ambulatory Visit: Payer: Self-pay | Admitting: Otolaryngology

## 2013-09-16 DIAGNOSIS — R221 Localized swelling, mass and lump, neck: Principal | ICD-10-CM

## 2013-09-16 DIAGNOSIS — R220 Localized swelling, mass and lump, head: Secondary | ICD-10-CM

## 2013-09-18 ENCOUNTER — Ambulatory Visit
Admission: RE | Admit: 2013-09-18 | Discharge: 2013-09-18 | Disposition: A | Discharge status: Home / Self Care | Payer: Medicare Other | Source: Ambulatory Visit | Attending: Otolaryngology | Admitting: Otolaryngology

## 2013-09-18 DIAGNOSIS — R220 Localized swelling, mass and lump, head: Secondary | ICD-10-CM

## 2013-09-18 DIAGNOSIS — R221 Localized swelling, mass and lump, neck: Principal | ICD-10-CM

## 2013-10-07 ENCOUNTER — Emergency Department (HOSPITAL_COMMUNITY)
Admission: EM | Admit: 2013-10-07 | Discharge: 2013-10-07 | Disposition: A | Discharge status: Home / Self Care | Payer: Medicare Other | Attending: Emergency Medicine | Admitting: Emergency Medicine

## 2013-10-07 ENCOUNTER — Encounter (HOSPITAL_COMMUNITY): Payer: Self-pay | Admitting: Emergency Medicine

## 2013-10-07 ENCOUNTER — Emergency Department (HOSPITAL_COMMUNITY): Payer: Medicare Other

## 2013-10-07 DIAGNOSIS — R0981 Nasal congestion: Secondary | ICD-10-CM

## 2013-10-07 DIAGNOSIS — R079 Chest pain, unspecified: Secondary | ICD-10-CM | POA: Insufficient documentation

## 2013-10-07 DIAGNOSIS — R930 Abnormal findings on diagnostic imaging of skull and head, not elsewhere classified: Secondary | ICD-10-CM | POA: Insufficient documentation

## 2013-10-07 DIAGNOSIS — R509 Fever, unspecified: Secondary | ICD-10-CM | POA: Insufficient documentation

## 2013-10-07 DIAGNOSIS — R0602 Shortness of breath: Secondary | ICD-10-CM | POA: Insufficient documentation

## 2013-10-07 DIAGNOSIS — Z8659 Personal history of other mental and behavioral disorders: Secondary | ICD-10-CM | POA: Insufficient documentation

## 2013-10-07 DIAGNOSIS — R110 Nausea: Secondary | ICD-10-CM | POA: Insufficient documentation

## 2013-10-07 DIAGNOSIS — R05 Cough: Secondary | ICD-10-CM | POA: Insufficient documentation

## 2013-10-07 DIAGNOSIS — R059 Cough, unspecified: Secondary | ICD-10-CM

## 2013-10-07 DIAGNOSIS — Z8742 Personal history of other diseases of the female genital tract: Secondary | ICD-10-CM | POA: Insufficient documentation

## 2013-10-07 DIAGNOSIS — Z8709 Personal history of other diseases of the respiratory system: Secondary | ICD-10-CM | POA: Insufficient documentation

## 2013-10-07 DIAGNOSIS — Z87891 Personal history of nicotine dependence: Secondary | ICD-10-CM | POA: Insufficient documentation

## 2013-10-07 DIAGNOSIS — Z862 Personal history of diseases of the blood and blood-forming organs and certain disorders involving the immune mechanism: Secondary | ICD-10-CM | POA: Insufficient documentation

## 2013-10-07 DIAGNOSIS — Z8701 Personal history of pneumonia (recurrent): Secondary | ICD-10-CM | POA: Insufficient documentation

## 2013-10-07 DIAGNOSIS — G43909 Migraine, unspecified, not intractable, without status migrainosus: Secondary | ICD-10-CM | POA: Insufficient documentation

## 2013-10-07 DIAGNOSIS — J3489 Other specified disorders of nose and nasal sinuses: Secondary | ICD-10-CM | POA: Insufficient documentation

## 2013-10-07 MED ORDER — SALINE SPRAY 0.65 % NA SOLN: 1.0000 | NASAL | Status: DC | PRN

## 2013-10-07 MED ORDER — BENZONATATE 100 MG PO CAPS: 100.0000 mg | ORAL_CAPSULE | Freq: Three times a day (TID) | ORAL | Status: DC

## 2013-10-07 NOTE — ED Notes (Signed)
Patient presents today with a chief complaint of nasal congestion and shortness of breath since last Friday. Patient also reporting pain to frontal sinuses, especially on the left side. Patient afebrile, denies nausea and vomiting today. Patient reports she's been taking Tylenol PM with minimal relief.

## 2013-10-07 NOTE — ED Provider Notes (Signed)
CSN: [id number]     Arrival date & time 10/07/13  1222 History  This chart was scribed for non-physician practitioner Junius FinnerErin O'Malley, PA-C working with Audree CamelScott T Goldston, MD by Valera CastleSteven Perry, ED scribe. This patient was seen in room WTR8/WTR8 and the patient's care was started at 12:58 PM.   Chief Complaint  Patient presents with  . Nasal Congestion   (Consider location/radiation/quality/duration/timing/severity/associated sxs/prior Treatment) The history is provided by the patient. No language interpreter was used.   HPI Comments: Nicole [name] is a 53 y.o. female who presents to the Emergency Department complaining of nasal congestion and SOB, onset 6 days ago. She reports associated fever, with a max temperature of 102, chills, and nausea due to not eating because of decreased appetite. She also reports cough, with chest pain upon coughing, onset 2 days ago. She reports taking Cipro she was prescribed 1 month ago by her ENT. She states she had stopped taking her Cipro, but resumed taking it 4 days ago, has not been consistent over the last 4 days. She has been taking Tylenol PM, without relief. She denies any other associated symptoms. She reports h/o bronchitis, pneumonia. States she was at the health clinic on Friday, 2/13 due to urinary symptoms, states UA showed blood but no UTI, states she was told they sent her urine for culture.  Pt believes she became sick after this visit on Friday due to another man there being sick. Pt states she was not feeling sick at that time. Denies SOB. Denies h/o smoking or PE.  PCP - Yisroel RammingVOLLMER, KELLY, MD  Past Medical History  Diagnosis Date  . Perimenopause   . Palpitations     negative cardiac w/u per Dr. Daleen SquibbWall 2/09  . Anemia   . Depression     bipolar  . History of migraine headaches     headaches reported as migraines   Past Surgical History  Procedure Laterality Date  . Tubal ligation  1987  . Exploratory laparotomy  2000    x2 (Dr. Dierdre ForthVanessa Haygood)   . Herpes simplex virus dfa     Family History  Problem Relation Age of Onset  . Hypertension Mother   . Diabetes Mother   . Heart disease Mother   . Thyroid disease Mother   . Breast cancer Maternal Aunt   . Pancreatic cancer Maternal Uncle   . Pancreatic cancer Other     family history  . Colon cancer Neg Hx    History  Substance Use Topics  . Smoking status: Former Smoker -- 0.60 packs/day for 30 years    Types: Cigarettes    Quit date: 07/20/2011  . Smokeless tobacco: Never Used     Comment: Few  . Alcohol Use: No     Comment: Rare glass of wine   OB History   Grav Para Term Preterm Abortions TAB SAB Ect Mult Living                 Review of Systems  Constitutional: Positive for fever, chills and appetite change (decreased).  HENT: Positive for congestion and sinus pressure.   Respiratory: Positive for cough and shortness of breath.   Cardiovascular: Positive for chest pain (with coughing).  Gastrointestinal: Positive for nausea. Negative for vomiting.  All other systems reviewed and are negative.   Allergies  Other  Home Medications   Current Outpatient Rx  Name  Route  Sig  Dispense  Refill  . acetaminophen (TYLENOL) 500 MG tablet  Oral   Take 500 mg by mouth every 6 (six) hours as needed for mild pain or headache.         . clotrimazole (LOTRIMIN) 1 % cream   Topical   Apply 1 application topically as needed (for rash).         . Omega-3 Fatty Acids (OMEGA 3 PO)   Oral   Take 1 capsule by mouth daily.         . SUMAtriptan (IMITREX) 100 MG tablet   Oral   Take 1 tablet (100 mg total) by mouth once as needed for migraine. May repeat x 1 after 2 hours; maximum 2 tabs per day and 8 tabs per month   8 tablet   6   . benzonatate (TESSALON) 100 MG capsule   Oral   Take 1 capsule (100 mg total) by mouth every 8 (eight) hours.   21 capsule   0   . sodium chloride (OCEAN) 0.65 % SOLN nasal spray   Each Nare   Place 1 spray into both  nostrils as needed for congestion.   1 Bottle   0    BP 133/86  Pulse 120  Temp(Src) 97.6 F (36.4 C) (Oral)  Resp 18  SpO2 100%  LMP 02/17/2011  Physical Exam  Nursing note and vitals reviewed. Constitutional: She is oriented to person, place, and time. She appears well-developed and well-nourished.  HENT:  Head: Normocephalic and atraumatic.  Right Ear: Hearing, tympanic membrane, external ear and ear canal normal.  Left Ear: Hearing, tympanic membrane, external ear and ear canal normal.  Nose: Mucosal edema and rhinorrhea present.  Mouth/Throat: Uvula is midline, oropharynx is clear and moist and mucous membranes are normal. No oropharyngeal exudate or posterior oropharyngeal erythema.  Eyes: EOM are normal.  Neck: Normal range of motion. Neck supple. No thyromegaly present.  Cardiovascular: Normal rate, regular rhythm and normal heart sounds.   No murmur heard. Pulmonary/Chest: Effort normal and breath sounds normal. No respiratory distress. She has no wheezes. She has no rales.  No respiratory distress. Able to speak in full sentences. Lungs CTAB.  Musculoskeletal: Normal range of motion.  Lymphadenopathy:    She has no cervical adenopathy.  Neurological: She is alert and oriented to person, place, and time.  Skin: Skin is warm and dry.  Psychiatric: She has a normal mood and affect. Her behavior is normal.    ED Course  Procedures (including critical care time)  DIAGNOSTIC STUDIES: Oxygen Saturation is 100% on room air, normal by my interpretation.    COORDINATION OF CARE: 1:04 PM-Discussed treatment plan which includes CXR with pt at bedside and pt agreed to plan.   Dg Chest 2 View  10/07/2013   CLINICAL DATA:  Shortness of breath, cough  EXAM: CHEST  2 VIEW  COMPARISON:  07/18/2013  FINDINGS: The heart size and mediastinal contours are within normal limits. Both lungs are clear. The visualized skeletal structures are unremarkable.  IMPRESSION: No active  cardiopulmonary disease.   Electronically Signed   By: Lahoma Crocker M.D.   On: 10/07/2013 13:47    Medications - No data to display  MDM   Final diagnoses:  Nasal congestion  Cough    Pt presenting with URI symptoms that started Saturday, 2/14. Pt appears well, non-toxic, NAD no respiratory distress. Pt concerned she has pneumonia due to hx of same.    Vitals: WNL, afebrile, O2- 100% on RA.  CXR: no active cardiopulmonary disease   Will tx  pt symptomatically. Rx: saline nasal spray, tessalon pearls. Advised to take acetaminophen and ibuprofen as needed for pain. Discussed importance of taking medication as prescribed. Advised not to take her Cipro unless specified by another medical provider.  Also discussed with pt to f/u with her PCP for her urine culture. Return precautions provided. Pt verbalized understanding and agreement with tx plan.   I personally performed the services described in this documentation, which was scribed in my presence. The recorded information has been reviewed and is accurate.    Noland Fordyce, PA-C 10/09/13 [phone]

## 2013-10-10 NOTE — ED Provider Notes (Signed)
Medical screening examination/treatment/procedure(s) were performed by non-physician practitioner and as supervising physician I was immediately available for consultation/collaboration.  EKG Interpretation   None         Ephraim Hamburger, MD 10/10/13 [phone]

## 2013-12-16 ENCOUNTER — Encounter (INDEPENDENT_AMBULATORY_CARE_PROVIDER_SITE_OTHER): Payer: Self-pay

## 2013-12-16 ENCOUNTER — Ambulatory Visit (INDEPENDENT_AMBULATORY_CARE_PROVIDER_SITE_OTHER): Payer: Medicare Other | Admitting: Nurse Practitioner

## 2013-12-16 ENCOUNTER — Encounter: Payer: Self-pay | Admitting: Nurse Practitioner

## 2013-12-16 VITALS — BP 98/66 | HR 76 | Temp 98.0°F | Ht 65.0 in | Wt 193.0 lb

## 2013-12-16 DIAGNOSIS — R0989 Other specified symptoms and signs involving the circulatory and respiratory systems: Secondary | ICD-10-CM

## 2013-12-16 DIAGNOSIS — R0683 Snoring: Secondary | ICD-10-CM

## 2013-12-16 DIAGNOSIS — G4719 Other hypersomnia: Secondary | ICD-10-CM

## 2013-12-16 DIAGNOSIS — G4730 Sleep apnea, unspecified: Secondary | ICD-10-CM

## 2013-12-16 DIAGNOSIS — R0609 Other forms of dyspnea: Secondary | ICD-10-CM

## 2013-12-16 DIAGNOSIS — R5383 Other fatigue: Secondary | ICD-10-CM

## 2013-12-16 DIAGNOSIS — G4710 Hypersomnia, unspecified: Secondary | ICD-10-CM

## 2013-12-16 DIAGNOSIS — R5381 Other malaise: Secondary | ICD-10-CM

## 2013-12-16 DIAGNOSIS — G43009 Migraine without aura, not intractable, without status migrainosus: Secondary | ICD-10-CM

## 2013-12-16 NOTE — Progress Notes (Signed)
PATIENT: Nicole [name] DOB: 1961/07/02  REASON FOR VISIT: routine follow up for Migraines HISTORY FROM: patient  HISTORY OF PRESENT ILLNESS: 53 year old right-handed female here for evaluation of headaches.  Patient does have headaches since age 66 years old, yearly headaches, with frontal, unilateral sometimes bilateral throbbing pressure sensation. Sometimes associated with stomach pain, photophobia and phonophobia. No nausea or vomiting. Over her life, in the last one to 2 years symptoms have changed. Now she has pinching and hair pulling sensation of the back of her head. These are more significant during periods of stress. From October to December 2014 patient was under higher stress with worse symptoms. In January 2015 symptoms have significantly improved. Patient has tried oxycodone/Tylenol in the past with mild relief. She's not been officially diagnosed with migraine in the past. She's not officially taken migraine medication in the past. No family history of migraine.   UPDATE 12/16/13 (LL): She returns for Migraine follow up, headache are some improved, did not try triptan.  She is concerned that she has sleep apnea and that this is causing her headaches.  She has family reports that she has long apnea pauses when she sleeps, history of snoring, ESS is 20 and FSS is 46 today.    REVIEW OF SYSTEMS: Full 14 system review of systems performed and notable only for sleepiness snoring dizziness, blood in urine, blurred vision     ALLERGIES: Allergies  Allergen Reactions  . Other Itching    Pain medication (Patient unsure of medication name)    HOME MEDICATIONS: Outpatient Prescriptions Prior to Visit  Medication Sig Dispense Refill  . acetaminophen (TYLENOL) 500 MG tablet Take 500 mg by mouth every 6 (six) hours as needed for mild pain or headache.      . benzonatate (TESSALON) 100 MG capsule Take 1 capsule (100 mg total) by mouth every 8 (eight) hours.  21 capsule  0  .  clotrimazole (LOTRIMIN) 1 % cream Apply 1 application topically as needed (for rash).      . Omega-3 Fatty Acids (OMEGA 3 PO) Take 1 capsule by mouth daily.      . sodium chloride (OCEAN) 0.65 % SOLN nasal spray Place 1 spray into both nostrils as needed for congestion.  1 Bottle  0  . SUMAtriptan (IMITREX) 100 MG tablet Take 1 tablet (100 mg total) by mouth once as needed for migraine. May repeat x 1 after 2 hours; maximum 2 tabs per day and 8 tabs per month  8 tablet  6   No facility-administered medications prior to visit.     PHYSICAL EXAM  Filed Vitals:   12/16/13 0946  BP: 98/66  Pulse: 76  Temp: 98 F (36.7 C)  TempSrc: Oral  Height: 5\' 5"  (1.651 m)  Weight: 193 lb (87.544 kg)   Body mass index is 32.12 kg/(m^2).  Generalized: Well developed, in no acute distress  Head: normocephalic and atraumatic. Oropharynx benign  Neck: Supple, no carotid bruits  Cardiac: Regular rate rhythm, no murmur  Musculoskeletal: No deformity   Neurological examination  Mentation: Alert oriented to time, place, history taking. Follows all commands speech and language fluent Cranial nerve II-XII:  Pupils were equal round reactive to light extraocular movements were full, visual field were full on confrontational test. Facial sensation and strength were normal. hearing was intact to finger rubbing bilaterally. Uvula tongue midline. head turning and shoulder shrug and were normal and symmetric.Tongue protrusion into cheek strength was normal. Motor: The motor testing reveals  5 over 5 strength of all 4 extremities. Good symmetric motor tone is noted throughout.  Sensory: Sensory testing is intact to pinprick, soft touch, vibration sensation, and position sense on all 4 extremities. No evidence of extinction is noted.  Coordination: Cerebellar testing reveals good finger-nose-finger and heel-to-shin bilaterally.  Gait and station: Gait is normal. Tandem gait is normal. Romberg is negative. No drift is  seen.  Reflexes: Deep tendon reflexes are symmetric and normal bilaterally. Toes are downgoing bilaterally.   ASSESSMENT AND PLAN 53 y.o. year old female here with migraine without aura since age 3-27 years old. Symptoms worse with stress. Symptoms changing slightly in last 1-2 years, but improving in the last 1 month. Her headaches have improved some since last visit and she is more concerned now with sleep apnea.  She has family reports that she has long apnea pauses when she sleeps, history of snoring, ESS is 20 and FSS is 46 today.  PLAN:  Continue sumatriptan prn  MRI brain without Referral to Sleep at [facility] for evaluation for sleep apnea.  Orders Placed This Encounter  Procedures  . MR Brain Wo Contrast  . Ambulatory referral to Sleep Studies   Rudi Rummage LAM, MSN, NP-C 2013-12-31, 6:08 PM [facility] [address] [place], [place] [address] [phone]  Note: This document was prepared with digital dictation and possible smart phrase technology. Any transcriptional errors that result from this process are unintentional.

## 2013-12-16 NOTE — Patient Instructions (Signed)
Continue Sumatriptan for severe headache.  We will check MRI of the brain, someone will call you to schedule to the appointment.  I will refer to our Sleep Center for Sleep Apnea.  Someone will call you to set up an appointment for evaluation.    Sleep Apnea  Sleep apnea is a sleep disorder characterized by abnormal pauses in breathing while you sleep. When your breathing pauses, the level of oxygen in your blood decreases. This causes you to move out of deep sleep and into light sleep. As a result, your quality of sleep is poor, and the system that carries your blood throughout your body (cardiovascular system) experiences stress. If sleep apnea remains untreated, the following conditions can develop:  High blood pressure (hypertension).  Coronary artery disease.  Inability to achieve or maintain an erection (impotence).  Impairment of your thought process (cognitive dysfunction). There are three types of sleep apnea: 1. Obstructive sleep apnea Pauses in breathing during sleep because of a blocked airway. 2. Central sleep apnea Pauses in breathing during sleep because the area of the brain that controls your breathing does not send the correct signals to the muscles that control breathing. 3. Mixed sleep apnea A combination of both obstructive and central sleep apnea. RISK FACTORS The following risk factors can increase your risk of developing sleep apnea:  Being overweight.  Smoking.  Having narrow passages in your nose and throat.  Being of older age.  Being female.  Alcohol use.  Sedative and tranquilizer use.  Ethnicity. Among individuals younger than 35 years, African Americans are at increased risk of sleep apnea. SYMPTOMS   Difficulty staying asleep.  Daytime sleepiness and fatigue.  Loss of energy.  Irritability.  Loud, heavy snoring.  Morning headaches.  Trouble concentrating.  Forgetfulness.  Decreased interest in sex. DIAGNOSIS  In order to  diagnose sleep apnea, your caregiver will perform a physical examination. Your caregiver may suggest that you take a home sleep test. Your caregiver may also recommend that you spend the night in a sleep lab. In the sleep lab, several monitors record information about your heart, lungs, and brain while you sleep. Your leg and arm movements and blood oxygen level are also recorded. TREATMENT The following actions may help to resolve mild sleep apnea:  Sleeping on your side.   Using a decongestant if you have nasal congestion.   Avoiding the use of depressants, including alcohol, sedatives, and narcotics.   Losing weight and modifying your diet if you are overweight. There also are devices and treatments to help open your airway:  Oral appliances. These are custom-made mouthpieces that shift your lower jaw forward and slightly open your bite. This opens your airway.  Devices that create positive airway pressure. This positive pressure "splints" your airway open to help you breathe better during sleep. The following devices create positive airway pressure:  Continuous positive airway pressure (CPAP) device. The CPAP device creates a continuous level of air pressure with an air pump. The air is delivered to your airway through a mask while you sleep. This continuous pressure keeps your airway open.  Nasal expiratory positive airway pressure (EPAP) device. The EPAP device creates positive air pressure as you exhale. The device consists of single-use valves, which are inserted into each nostril and held in place by adhesive. The valves create very little resistance when you inhale but create much more resistance when you exhale. That increased resistance creates the positive airway pressure. This positive pressure while you exhale keeps  your airway open, making it easier to breath when you inhale again.  Bilevel positive airway pressure (BPAP) device. The BPAP device is used mainly in patients with  central sleep apnea. This device is similar to the CPAP device because it also uses an air pump to deliver continuous air pressure through a mask. However, with the BPAP machine, the pressure is set at two different levels. The pressure when you exhale is lower than the pressure when you inhale.  Surgery. Typically, surgery is only done if you cannot comply with less invasive treatments or if the less invasive treatments do not improve your condition. Surgery involves removing excess tissue in your airway to create a wider passage way. Document Released: 07/27/2002 Document Revised: 12/01/2012 Document Reviewed: 12/13/2011 [facility] Patient Information 2014 [place].

## 2013-12-17 ENCOUNTER — Telehealth: Payer: Self-pay | Admitting: Neurology

## 2013-12-17 DIAGNOSIS — R51 Headache: Secondary | ICD-10-CM

## 2013-12-17 DIAGNOSIS — G4733 Obstructive sleep apnea (adult) (pediatric): Secondary | ICD-10-CM

## 2013-12-17 DIAGNOSIS — R519 Headache, unspecified: Secondary | ICD-10-CM

## 2013-12-17 DIAGNOSIS — R400 Somnolence: Secondary | ICD-10-CM

## 2013-12-17 DIAGNOSIS — E669 Obesity, unspecified: Secondary | ICD-10-CM

## 2013-12-17 NOTE — Telephone Encounter (Signed)
Nicole Holler, NP/Dr. Leta Baptist..refers patient for attended sleep study.  Height: 5'5  Weight: 193 lbs.  BMI: 32.12  Past Medical History:  Perimenopause  Palpitations  negative cardiac w/u per Dr. Verl Blalock 2/09  Anemia  Depression  bipolar  History of migraine headaches  headaches reported as migraines   Sleep Symptoms: family reports that she has long apnea pauses when she sleeps and snoring  Epworth Score: 20  Medications:  Acetaminophen (Tab) TYLENOL 500 MG Take 500 mg by mouth every 6 (six) hours as needed for mild pain or headache. Benzonatate (Cap) TESSALON 100 MG Take 1 capsule (100 mg total) by mouth every 8 (eight) hours. Clotrimazole (Cream) LOTRIMIN 1 % Apply 1 application topically as needed (for rash). Omega-3 Fatty Acids OMEGA 3 PO Take 1 capsule by mouth daily. SUMAtriptan Succinate (Tab) IMITREX 100 MG Take 1 tablet (100 mg total) by mouth once as needed for migraine. May repeat x 1 after 2 hours; maximum 2 tabs per day and 8 tabs per month Saline (Solution) OCEAN 0.65 % Place 1 spray into both nostrils as needed for congestion   Insurance: Medicaid ([place] [employer])  Nicole [name]'s Assessment and Plan:  53 y.o. year old female here with migraine without aura since age 62-1 years old. Symptoms worse with stress. Symptoms changing slightly in last 1-2 years, but improving in the last 1 month. Her headaches have improved some since last visit and she is more concerned now with sleep apnea. She has family reports that she has long apnea pauses when she sleeps, history of snoring, ESS is 20 and FSS is 46 today.      Please review patient information and submit instructions for scheduling and orders for sleep technologist.  Thank you!

## 2013-12-17 NOTE — Telephone Encounter (Signed)
Sleep study request review: This patient has an underlying medical history of palpitations, depression, migraine headaches, obesity and anemia and is referred by Dr. Leta Baptist and Charlott Holler for an attended sleep study due to a report of snoring, excessive daytime somnolence and long apneic pauses while asleep. I will order a split-night sleep study and see the patient in sleep medicine consultation afterwards. Star Age, MD, PhD [facility])

## 2013-12-19 ENCOUNTER — Ambulatory Visit
Admission: RE | Admit: 2013-12-19 | Discharge: 2013-12-19 | Disposition: A | Discharge status: Home / Self Care | Payer: Medicare Other | Source: Ambulatory Visit | Attending: Nurse Practitioner | Admitting: Nurse Practitioner

## 2013-12-19 DIAGNOSIS — G4719 Other hypersomnia: Secondary | ICD-10-CM

## 2013-12-19 DIAGNOSIS — R0683 Snoring: Secondary | ICD-10-CM

## 2013-12-19 DIAGNOSIS — R5381 Other malaise: Secondary | ICD-10-CM

## 2013-12-19 DIAGNOSIS — G4730 Sleep apnea, unspecified: Secondary | ICD-10-CM

## 2013-12-19 DIAGNOSIS — R51 Headache: Secondary | ICD-10-CM

## 2013-12-19 DIAGNOSIS — G43009 Migraine without aura, not intractable, without status migrainosus: Secondary | ICD-10-CM

## 2013-12-19 DIAGNOSIS — R5383 Other fatigue: Secondary | ICD-10-CM

## 2013-12-23 NOTE — Progress Notes (Signed)
Quick Note:  Shared normal MR brain results with patient, verbalized understanding ______

## 2013-12-26 ENCOUNTER — Encounter (HOSPITAL_COMMUNITY): Payer: Self-pay | Admitting: Emergency Medicine

## 2013-12-26 ENCOUNTER — Emergency Department (HOSPITAL_COMMUNITY)
Admission: EM | Admit: 2013-12-26 | Discharge: 2013-12-26 | Disposition: A | Discharge status: Home / Self Care | Payer: Medicare Other | Attending: Emergency Medicine | Admitting: Emergency Medicine

## 2013-12-26 DIAGNOSIS — Z87891 Personal history of nicotine dependence: Secondary | ICD-10-CM | POA: Insufficient documentation

## 2013-12-26 DIAGNOSIS — Z862 Personal history of diseases of the blood and blood-forming organs and certain disorders involving the immune mechanism: Secondary | ICD-10-CM | POA: Insufficient documentation

## 2013-12-26 DIAGNOSIS — Z8659 Personal history of other mental and behavioral disorders: Secondary | ICD-10-CM | POA: Insufficient documentation

## 2013-12-26 DIAGNOSIS — G43909 Migraine, unspecified, not intractable, without status migrainosus: Secondary | ICD-10-CM | POA: Insufficient documentation

## 2013-12-26 DIAGNOSIS — Z79899 Other long term (current) drug therapy: Secondary | ICD-10-CM | POA: Insufficient documentation

## 2013-12-26 DIAGNOSIS — Z8742 Personal history of other diseases of the female genital tract: Secondary | ICD-10-CM | POA: Insufficient documentation

## 2013-12-26 DIAGNOSIS — M7989 Other specified soft tissue disorders: Secondary | ICD-10-CM | POA: Insufficient documentation

## 2013-12-26 MED ORDER — ENOXAPARIN SODIUM 100 MG/ML ~~LOC~~ SOLN
1.0000 mg/kg | Freq: Once | SUBCUTANEOUS | Status: AC
  Administered 2013-12-26: 90 mg via SUBCUTANEOUS
  Filled 2013-12-26: qty 1

## 2013-12-26 NOTE — ED Provider Notes (Signed)
Medical screening examination/treatment/procedure(s) were performed by non-physician practitioner and as supervising physician I was immediately available for consultation/collaboration.   EKG Interpretation None        Osvaldo Shipper, MD 12/26/13 2240

## 2013-12-26 NOTE — ED Provider Notes (Signed)
CSN: [id number]     Arrival date & time 12/26/13  1748 History  This chart was scribed for non-physician practitioner, Domenic Moras, PA-C working with Osvaldo Shipper, MD by Frederich Balding, ED scribe. This patient was seen in room TR07C/TR07C and the patient's care was started at 8:53 PM.   Chief Complaint  Patient presents with  . Foot Pain  . Leg Pain   The history is provided by the patient. No language interpreter was used.   HPI Comments: Nicole [name] is a 53 y.o. female who presents to the Emergency Department complaining of gradually worsening right foot pain that radiates into her calf with ambulation that started 2 days ago. She also has associated mild swelling. Denies recent injury but states she was exercising before the pain started. Denies chest pain, trouble breathing, ankle pain, knee pain, hip pain. Pt has remote history of cervical cancer and has regular check ups. Denies history of blood clots. Denies recent surgeries or long distance travel. Pt is not currently on any hormones.   Past Medical History  Diagnosis Date  . Perimenopause   . Palpitations     negative cardiac w/u per Dr. Verl Blalock 2/09  . Anemia   . Depression     bipolar  . History of migraine headaches     headaches reported as migraines   Past Surgical History  Procedure Laterality Date  . Tubal ligation  1987  . Exploratory laparotomy  2000    x2 (Dr. Kendall Flack)  . Herpes simplex virus dfa     Family History  Problem Relation Age of Onset  . Hypertension Mother   . Diabetes Mother   . Heart disease Mother   . Thyroid disease Mother   . Breast cancer Maternal Aunt   . Pancreatic cancer Maternal Uncle   . Pancreatic cancer Other     family history  . Colon cancer Neg Hx    History  Substance Use Topics  . Smoking status: Former Smoker -- 0.60 packs/day for 30 years    Types: Cigarettes    Quit date: 07/20/2011  . Smokeless tobacco: Never Used     Comment: Few  . Alcohol Use: Yes   Comment: Rare glass of wine   OB History   Grav Para Term Preterm Abortions TAB SAB Ect Mult Living                 Review of Systems  Cardiovascular: Negative for chest pain.  Musculoskeletal: Positive for arthralgias, joint swelling and myalgias.  All other systems reviewed and are negative.  Allergies  Other  Home Medications   Prior to Admission medications   Medication Sig Start Date End Date Taking? Authorizing Provider  acetaminophen (TYLENOL) 500 MG tablet Take 500 mg by mouth every 6 (six) hours as needed for mild pain or headache.    Historical Provider, MD  benzonatate (TESSALON) 100 MG capsule Take 1 capsule (100 mg total) by mouth every 8 (eight) hours. 10/07/13   Noland Fordyce, PA-C  clotrimazole (LOTRIMIN) 1 % cream Apply 1 application topically as needed (for rash).    Historical Provider, MD  Omega-3 Fatty Acids (OMEGA 3 PO) Take 1 capsule by mouth daily.    Historical Provider, MD  sodium chloride (OCEAN) 0.65 % SOLN nasal spray Place 1 spray into both nostrils as needed for congestion. 10/07/13   Noland Fordyce, PA-C  SUMAtriptan (IMITREX) 100 MG tablet Take 1 tablet (100 mg total) by mouth once as needed  for migraine. May repeat x 1 after 2 hours; maximum 2 tabs per day and 8 tabs per month 09/14/13   Penni Bombard, MD   BP 138/84  Pulse 80  Temp(Src) 98 F (36.7 C) (Oral)  Resp 18  Ht 5' 5.5" (1.664 m)  Wt 195 lb 1 oz (88.48 kg)  BMI 31.95 kg/m2  SpO2 99%  LMP 02/17/2011  Physical Exam  Nursing note and vitals reviewed. Constitutional: She is oriented to person, place, and time. She appears well-developed and well-nourished. No distress.  HENT:  Head: Normocephalic and atraumatic.  Eyes: EOM are normal.  Neck: Neck supple. No tracheal deviation present.  Cardiovascular: Normal rate.   Pulmonary/Chest: Effort normal. No respiratory distress.  Musculoskeletal: Normal range of motion.  Right foot tenderness to lateral aspects of dorsum of foot with  mild edema. No ankle tenderness. Dorsalis pedis palpable. Brisk capillary refill to all toes. No pitting edema.  Neurological: She is alert and oriented to person, place, and time.  Skin: Skin is warm and dry.  Psychiatric: She has a normal mood and affect. Her behavior is normal.    ED Course  Procedures (including critical care time)  DIAGNOSTIC STUDIES: Oxygen Saturation is 97% on RA, normal by my interpretation.    COORDINATION OF CARE: 8:59 PM-Discussed treatment plan which includes scheduling for an ultrasound tomorrow morning and a shot of Lovenox with pt at bedside and pt agreed to plan. Low suspicion for DVT but does have some risk factors.  If neg, pt can f/u with PCP for further care.   Labs Review Labs Reviewed - No data to display  Imaging Review No results found.   EKG Interpretation None      MDM   Final diagnoses:  Right leg swelling    BP 138/84  Pulse 80  Temp(Src) 98 F (36.7 C) (Oral)  Resp 18  Ht 5' 5.5" (1.664 m)  Wt 195 lb 1 oz (88.48 kg)  BMI 31.95 kg/m2  SpO2 99%  LMP 02/17/2011   I personally performed the services described in this documentation, which was scribed in my presence. The recorded information has been reviewed and is accurate.  Domenic Moras, PA-C 12/26/13 2112

## 2013-12-26 NOTE — ED Notes (Signed)
Onset Thursday 2 hours after leaving gym pts right lateral side of foot started hurting.  Onset last night pain started in right calf.  Pain in calf worse when standing and moving leg. Pt states feet swollen.  Has had swelling in feet in the past.  Pt also concerned with BP.

## 2013-12-26 NOTE — ED Notes (Signed)
Pt c/o pain to right foot that has moved to right calf onset 2 days ago. Pt ambulatory in triage. Pt denies recent injury. Pt presents with swelling to right foot and leg.

## 2013-12-26 NOTE — Discharge Instructions (Signed)
Please follow up at [facility] vascular lab tomorrow around 8:30 am for a ultrasound study to make sure you do not have blood clot in your leg.  If it is negative, please follow up with your doctor for further evaluation of your foot pain.    Peripheral Edema You have swelling in your legs (peripheral edema). This swelling is due to excess accumulation of salt and water in your body. Edema may be a sign of heart, kidney or liver disease, or a side effect of a medication. It may also be due to problems in the leg veins. Elevating your legs and using special support stockings may be very helpful, if the cause of the swelling is due to poor venous circulation. Avoid long periods of standing, whatever the cause. Treatment of edema depends on identifying the cause. Chips, pretzels, pickles and other salty foods should be avoided. Restricting salt in your diet is almost always needed. Water pills (diuretics) are often used to remove the excess salt and water from your body via urine. These medicines prevent the kidney from reabsorbing sodium. This increases urine flow. Diuretic treatment may also result in lowering of potassium levels in your body. Potassium supplements may be needed if you have to use diuretics daily. Daily weights can help you keep track of your progress in clearing your edema. You should call your caregiver for follow up care as recommended. SEEK IMMEDIATE MEDICAL CARE IF:   You have increased swelling, pain, redness, or heat in your legs.  You develop shortness of breath, especially when lying down.  You develop chest or abdominal pain, weakness, or fainting.  You have a fever. Document Released: 09/13/2004 Document Revised: 10/29/2011 Document Reviewed: 08/24/2009 [facility] Patient Information 2014 [place].

## 2013-12-27 ENCOUNTER — Inpatient Hospital Stay (HOSPITAL_COMMUNITY)
Admission: RE | Admit: 2013-12-27 | Discharge: 2013-12-27 | Disposition: A | Discharge status: Home / Self Care | Payer: Medicare Other | Source: Ambulatory Visit

## 2013-12-27 DIAGNOSIS — M79609 Pain in unspecified limb: Secondary | ICD-10-CM | POA: Diagnosis not present

## 2013-12-29 NOTE — Progress Notes (Signed)
VASCULAR LAB PRELIMINARY  PRELIMINARY  PRELIMINARY  PRELIMINARY  Right lower extremity venous duplex completed.    Preliminary report:  Right:  No evidence of DVT, superficial thrombosis, or Baker's cyst.  [place], RVS 12/29/2013, 10:37 AM

## 2014-01-27 ENCOUNTER — Ambulatory Visit (INDEPENDENT_AMBULATORY_CARE_PROVIDER_SITE_OTHER): Payer: Medicare Other | Admitting: Neurology

## 2014-01-27 DIAGNOSIS — G4730 Sleep apnea, unspecified: Secondary | ICD-10-CM

## 2014-01-27 DIAGNOSIS — G4710 Hypersomnia, unspecified: Secondary | ICD-10-CM

## 2014-01-27 DIAGNOSIS — R51 Headache: Secondary | ICD-10-CM

## 2014-01-27 DIAGNOSIS — R519 Headache, unspecified: Secondary | ICD-10-CM

## 2014-01-27 DIAGNOSIS — G4733 Obstructive sleep apnea (adult) (pediatric): Secondary | ICD-10-CM

## 2014-01-27 DIAGNOSIS — G479 Sleep disorder, unspecified: Secondary | ICD-10-CM

## 2014-01-27 DIAGNOSIS — R400 Somnolence: Secondary | ICD-10-CM

## 2014-01-27 DIAGNOSIS — E669 Obesity, unspecified: Secondary | ICD-10-CM

## 2014-01-29 NOTE — Progress Notes (Signed)
I reviewed note and agree with plan.   Ayo Guarino R. Senon Nixon, MD  Certified in Neurology, Neurophysiology and Neuroimaging  [facility] [address], [address] [place], [place] [address] [phone]   

## 2014-02-10 ENCOUNTER — Telehealth: Payer: Self-pay | Admitting: Neurology

## 2014-02-10 DIAGNOSIS — G4733 Obstructive sleep apnea (adult) (pediatric): Secondary | ICD-10-CM

## 2014-02-10 DIAGNOSIS — E669 Obesity, unspecified: Secondary | ICD-10-CM

## 2014-02-10 DIAGNOSIS — R400 Somnolence: Secondary | ICD-10-CM

## 2014-02-10 NOTE — Telephone Encounter (Signed)
I tried calling the patient to give sleep study results, but was unable to leave a message due to voice mail is full.

## 2014-02-10 NOTE — Telephone Encounter (Signed)
Please call and notify the patient that the recent sleep study did confirm the diagnosis of obstructive sleep apnea (mild to moderate) and in light of her recurrent headaches and sleep related complaints, including severe sleepiness reported, I recommend treatment for this in the form of CPAP. This will require a repeat sleep study for proper titration and mask fitting. Please explain to patient and arrange for a CPAP titration study. I have placed an order in the chart. Thanks, Star Age, MD, PhD [facility])

## 2014-02-11 ENCOUNTER — Encounter: Payer: Self-pay | Admitting: *Deleted

## 2014-02-11 NOTE — Telephone Encounter (Signed)
I called and left a message for the patient about her recent sleep study results. I informed the patient that the study did confirm the diagnosis of obstructive sleep apnea and due to her recurrent headaches and sleep related compliants, Dr. Rexene Alberts recommend treatment in the form of CPAP. This will required a repeat study for proper titration and mask fitting. I will fax a copy to Charlott Holler, NP and mail a copy to the patient.

## 2014-03-21 ENCOUNTER — Emergency Department (HOSPITAL_COMMUNITY)
Admission: EM | Admit: 2014-03-21 | Discharge: 2014-03-21 | Disposition: A | Discharge status: Home / Self Care | Payer: Medicare Other | Attending: Emergency Medicine | Admitting: Emergency Medicine

## 2014-03-21 ENCOUNTER — Encounter (HOSPITAL_COMMUNITY): Payer: Self-pay | Admitting: Emergency Medicine

## 2014-03-21 ENCOUNTER — Emergency Department (HOSPITAL_COMMUNITY): Payer: Medicare Other

## 2014-03-21 DIAGNOSIS — Z8659 Personal history of other mental and behavioral disorders: Secondary | ICD-10-CM | POA: Insufficient documentation

## 2014-03-21 DIAGNOSIS — R000 Tachycardia, unspecified: Secondary | ICD-10-CM | POA: Insufficient documentation

## 2014-03-21 DIAGNOSIS — Z792 Long term (current) use of antibiotics: Secondary | ICD-10-CM | POA: Insufficient documentation

## 2014-03-21 DIAGNOSIS — N898 Other specified noninflammatory disorders of vagina: Secondary | ICD-10-CM | POA: Insufficient documentation

## 2014-03-21 DIAGNOSIS — Z87891 Personal history of nicotine dependence: Secondary | ICD-10-CM | POA: Insufficient documentation

## 2014-03-21 DIAGNOSIS — IMO0002 Reserved for concepts with insufficient information to code with codable children: Secondary | ICD-10-CM | POA: Diagnosis not present

## 2014-03-21 DIAGNOSIS — Z8742 Personal history of other diseases of the female genital tract: Secondary | ICD-10-CM | POA: Insufficient documentation

## 2014-03-21 DIAGNOSIS — R21 Rash and other nonspecific skin eruption: Secondary | ICD-10-CM | POA: Insufficient documentation

## 2014-03-21 DIAGNOSIS — J40 Bronchitis, not specified as acute or chronic: Secondary | ICD-10-CM | POA: Insufficient documentation

## 2014-03-21 DIAGNOSIS — J3489 Other specified disorders of nose and nasal sinuses: Secondary | ICD-10-CM | POA: Insufficient documentation

## 2014-03-21 DIAGNOSIS — B353 Tinea pedis: Secondary | ICD-10-CM | POA: Diagnosis not present

## 2014-03-21 DIAGNOSIS — Z862 Personal history of diseases of the blood and blood-forming organs and certain disorders involving the immune mechanism: Secondary | ICD-10-CM | POA: Insufficient documentation

## 2014-03-21 LAB — WET PREP, GENITAL
Clue Cells Wet Prep HPF POC: NONE SEEN
TRICH WET PREP: NONE SEEN
YEAST WET PREP: NONE SEEN

## 2014-03-21 LAB — BASIC METABOLIC PANEL
Anion gap: 13 (ref 5–15)
BUN: 14 mg/dL (ref 6–23)
CALCIUM: 9.3 mg/dL (ref 8.4–10.5)
CHLORIDE: 103 meq/L (ref 96–112)
CO2: 24 mEq/L (ref 19–32)
CREATININE: 0.77 mg/dL (ref 0.50–1.10)
GFR calc non Af Amer: >90 mL/min (ref >90)
Glucose, Bld: 126 mg/dL — ABNORMAL HIGH (ref 70–99)
Potassium: 4.4 mEq/L (ref 3.7–5.3)
Sodium: 140 mEq/L (ref 137–147)

## 2014-03-21 LAB — CBC WITH DIFFERENTIAL/PLATELET
Basophils Absolute: 0 10*3/uL (ref 0.0–0.1)
Basophils Relative: 0 % (ref 0–1)
Eosinophils Absolute: 0.2 10*3/uL (ref 0.0–0.7)
Eosinophils Relative: 3 % (ref 0–5)
HCT: 35.3 % — ABNORMAL LOW (ref 36.0–46.0)
HEMOGLOBIN: 11.5 g/dL — AB (ref 12.0–15.0)
Lymphocytes Relative: 27 % (ref 12–46)
Lymphs Abs: 1.8 10*3/uL (ref 0.7–4.0)
MCH: 29.9 pg (ref 26.0–34.0)
MCHC: 32.6 g/dL (ref 30.0–36.0)
MCV: 91.7 fL (ref 78.0–100.0)
MONOS PCT: 8 % (ref 3–12)
Monocytes Absolute: 0.5 10*3/uL (ref 0.1–1.0)
NEUTROS ABS: 4 10*3/uL (ref 1.7–7.7)
Neutrophils Relative %: 62 % (ref 43–77)
Platelets: 189 10*3/uL (ref 150–400)
RBC: 3.85 MIL/uL — ABNORMAL LOW (ref 3.87–5.11)
RDW: 13.5 % (ref 11.5–15.5)
WBC: 6.5 10*3/uL (ref 4.0–10.5)

## 2014-03-21 LAB — HIV ANTIBODY (ROUTINE TESTING W REFLEX): HIV: NONREACTIVE

## 2014-03-21 LAB — URINALYSIS, ROUTINE W REFLEX MICROSCOPIC
Bilirubin Urine: NEGATIVE
Glucose, UA: NEGATIVE mg/dL
KETONES UR: NEGATIVE mg/dL
Leukocytes, UA: NEGATIVE
NITRITE: NEGATIVE
Protein, ur: NEGATIVE mg/dL
SPECIFIC GRAVITY, URINE: 1.018 (ref 1.005–1.030)
UROBILINOGEN UA: 0.2 mg/dL (ref 0.0–1.0)
pH: 5.5 (ref 5.0–8.0)

## 2014-03-21 LAB — URINE MICROSCOPIC-ADD ON

## 2014-03-21 MED ORDER — SODIUM CHLORIDE 0.9 % IV BOLUS (SEPSIS)
1000.0000 mL | Freq: Once | INTRAVENOUS | Status: AC
  Administered 2014-03-21: 1000 mL via INTRAVENOUS

## 2014-03-21 MED ORDER — CLOTRIMAZOLE 1 % EX CREA: TOPICAL_CREAM | CUTANEOUS | Status: DC

## 2014-03-21 MED ORDER — PREDNISONE 20 MG PO TABS: ORAL_TABLET | ORAL | Status: DC

## 2014-03-21 NOTE — Discharge Instructions (Signed)
Acute Bronchitis Bronchitis is inflammation of the airways that extend from the windpipe into the lungs (bronchi). The inflammation often causes mucus to develop. This leads to a cough, which is the most common symptom of bronchitis.  In acute bronchitis, the condition usually develops suddenly and goes away over time, usually in a couple weeks. Smoking, allergies, and asthma can make bronchitis worse. Repeated episodes of bronchitis may cause further lung problems.  CAUSES Acute bronchitis is most often caused by the same virus that causes a cold. The virus can spread from person to person (contagious) through coughing, sneezing, and touching contaminated objects. SIGNS AND SYMPTOMS   Cough.   Fever.   Coughing up mucus.   Body aches.   Chest congestion.   Chills.   Shortness of breath.   Sore throat.  DIAGNOSIS  Acute bronchitis is usually diagnosed through a physical exam. Your health care provider will also ask you questions about your medical history. Tests, such as chest X-rays, are sometimes done to rule out other conditions.  TREATMENT  Acute bronchitis usually goes away in a couple weeks. Oftentimes, no medical treatment is necessary. Medicines are sometimes given for relief of fever or cough. Antibiotic medicines are usually not needed but may be prescribed in certain situations. In some cases, an inhaler may be recommended to help reduce shortness of breath and control the cough. A cool mist vaporizer may also be used to help thin bronchial secretions and make it easier to clear the chest.  HOME CARE INSTRUCTIONS  Get plenty of rest.   Drink enough fluids to keep your urine clear or pale yellow (unless you have a medical condition that requires fluid restriction). Increasing fluids may help thin your respiratory secretions (sputum) and reduce chest congestion, and it will prevent dehydration.   Take medicines only as directed by your health care provider.  If  you were prescribed an antibiotic medicine, finish it all even if you start to feel better.  Avoid smoking and secondhand smoke. Exposure to cigarette smoke or irritating chemicals will make bronchitis worse. If you are a smoker, consider using nicotine gum or skin patches to help control withdrawal symptoms. Quitting smoking will help your lungs heal faster.   Reduce the chances of another bout of acute bronchitis by washing your hands frequently, avoiding people with cold symptoms, and trying not to touch your hands to your mouth, nose, or eyes.   Keep all follow-up visits as directed by your health care provider.  SEEK MEDICAL CARE IF: Your symptoms do not improve after 1 week of treatment.  SEEK IMMEDIATE MEDICAL CARE IF:  You develop an increased fever or chills.   You have chest pain.   You have severe shortness of breath.  You have bloody sputum.   You develop dehydration.  You faint or repeatedly feel like you are going to pass out.  You develop repeated vomiting.  You develop a severe headache. MAKE SURE YOU:   Understand these instructions.  Will watch your condition.  Will get help right away if you are not doing well or get worse. Document Released: 09/13/2004 Document Revised: 12/21/2013 Document Reviewed: 01/27/2013 [facility] Patient Information 2015 [place], [place]. This information is not intended to replace advice given to you by your health care provider. Make sure you discuss any questions you have with your health care provider.     Athlete's Foot  Athlete's foot is a skin infection caused by a fungus. Athlete's foot is often seen between or  under the toes. It can also be seen on the bottom of the foot. Athlete's foot can spread to other people by sharing towels or shower stalls. HOME CARE  Only take medicines as told by your doctor. Do not use steroid creams.  Wash your feet daily. Dry your feet well, especially between the toes.  Change your  socks every day. Wear cotton or wool socks.  Change your socks 2 to 3 times a day in hot weather.  Wear sandals or canvas tennis shoes with good airflow.  If you have blisters, soak your feet in a solution as told by your doctor. Do this for 20 to 30 minutes, 2 times a day. Dry your feet well after you soak them.  Do not share towels.  Wear sandals when you use shared locker rooms or showers. GET HELP RIGHT AWAY IF:   You have a fever.  Your foot is puffy (swollen), sore, warm, or red.  You are not getting better after 7 days of treatment.  You still have athlete's foot after 30 days.  You have problems caused by your medicine. MAKE SURE YOU:   Understand these instructions.  Will watch your condition.  Will get help right away if you are not doing well or get worse. Document Released: 01/23/2008 Document Revised: 10/29/2011 Document Reviewed: 05/25/2011 [facility] Patient Information 2015 [place], [place]. This information is not intended to replace advice given to you by your health care provider. Make sure you discuss any questions you have with your health care provider.

## 2014-03-21 NOTE — ED Notes (Signed)
She c/o sore throat/congestion/cough x 2-3 days.  She cites living at a "shelter" at which many people have been exhibiting these same s/s.  She is in no distress and states she walked here from the shelter.

## 2014-03-21 NOTE — ED Notes (Addendum)
Pt c/o upper respiratory infection and yellow vaginal discharge that has "a smell". Pt denies unprotected intercourse and states " I haven't done anything in a year, it's because a I wash after I urinate".

## 2014-03-21 NOTE — ED Provider Notes (Signed)
CSN: [id number]     Arrival date & time 03/21/14  0831 History   First MD Initiated Contact with Patient 03/21/14 [phone]     Chief Complaint  Patient presents with  . URI     (Consider location/radiation/quality/duration/timing/severity/associated sxs/prior Treatment) Patient is a 53 y.o. female presenting with URI. The history is provided by the patient. No language interpreter was used.  URI Presenting symptoms: congestion, cough and sore throat   Presenting symptoms: no fatigue, no fever and no rhinorrhea   Cough:    Cough characteristics:  Productive   Sputum characteristics:  Yellow   Severity:  Moderate   Duration:  3 days   Timing:  Constant   Progression:  Worsening   Chronicity:  New Severity:  Moderate Duration:  3 days Timing:  Constant Progression:  Worsening Chronicity:  New Relieved by:  Nothing Worsened by:  Nothing tried Ineffective treatments:  None tried Associated symptoms: no arthralgias, no headaches, no myalgias, no neck pain, no sinus pain, no sneezing and no wheezing   Risk factors: no chronic respiratory disease and no immunosuppression     Past Medical History  Diagnosis Date  . Perimenopause   . Palpitations     negative cardiac w/u per Dr. Verl Blalock 2/09  . Anemia   . Depression     bipolar  . History of migraine headaches     headaches reported as migraines   Past Surgical History  Procedure Laterality Date  . Tubal ligation  1987  . Exploratory laparotomy  2000    x2 (Dr. Kendall Flack)  . Herpes simplex virus dfa     Family History  Problem Relation Age of Onset  . Hypertension Mother   . Diabetes Mother   . Heart disease Mother   . Thyroid disease Mother   . Breast cancer Maternal Aunt   . Pancreatic cancer Maternal Uncle   . Pancreatic cancer Other     family history  . Colon cancer Neg Hx    History  Substance Use Topics  . Smoking status: Former Smoker -- 0.60 packs/day for 30 years    Types: Cigarettes    Quit date:  07/20/2011  . Smokeless tobacco: Never Used     Comment: Few  . Alcohol Use: Yes     Comment: Rare glass of wine   OB History   Grav Para Term Preterm Abortions TAB SAB Ect Mult Living                 Review of Systems  Constitutional: Negative for fever, chills, diaphoresis, activity change, appetite change and fatigue.  HENT: Positive for congestion and sore throat. Negative for facial swelling, rhinorrhea and sneezing.   Eyes: Negative for photophobia and discharge.  Respiratory: Positive for cough. Negative for chest tightness, shortness of breath and wheezing.   Cardiovascular: Negative for chest pain, palpitations and leg swelling.  Gastrointestinal: Negative for nausea, vomiting, abdominal pain and diarrhea.  Endocrine: Negative for polydipsia and polyuria.  Genitourinary: Positive for vaginal discharge. Negative for dysuria, frequency, difficulty urinating and pelvic pain.  Musculoskeletal: Negative for arthralgias, back pain, myalgias, neck pain and neck stiffness.  Skin: Positive for rash (itchy rash on feet). Negative for color change and wound.  Allergic/Immunologic: Negative for immunocompromised state.  Neurological: Negative for facial asymmetry, weakness, numbness and headaches.  Hematological: Does not bruise/bleed easily.  Psychiatric/Behavioral: Negative for confusion and agitation.      Allergies  Other  Home Medications   Prior to Admission  medications   Medication Sig Start Date End Date Taking? Authorizing Provider  clotrimazole (LOTRIMIN) 1 % cream Apply to affected area 2 times daily 03/21/14   Neta Ehlers, MD  predniSONE (DELTASONE) 20 MG tablet 3 tabs po day one, then 2 po daily x 4 days 03/21/14   Neta Ehlers, MD   BP 112/79  Pulse 94  Temp(Src) 98.3 F (36.8 C) (Oral)  Resp 16  SpO2 97%  LMP 02/17/2011 Physical Exam  Constitutional: She is oriented to person, place, and time. She appears well-developed and well-nourished. No distress.   HENT:  Head: Normocephalic and atraumatic.  Mouth/Throat: Mucous membranes are not pale, not dry and not cyanotic. Posterior oropharyngeal erythema present. No oropharyngeal exudate, posterior oropharyngeal edema or tonsillar abscesses.  Eyes: Pupils are equal, round, and reactive to light.  Neck: Normal range of motion. Neck supple.  Cardiovascular: Regular rhythm and normal heart sounds.  Tachycardia present.  Exam reveals no gallop and no friction rub.   No murmur heard. Pulmonary/Chest: Effort normal and breath sounds normal. No respiratory distress. She has no wheezes. She has no rales.  Abdominal: Soft. Bowel sounds are normal. She exhibits no distension and no mass. There is no tenderness. There is no rebound and no guarding.  Musculoskeletal: Normal range of motion. She exhibits no edema and no tenderness.       Feet:  Neurological: She is alert and oriented to person, place, and time.  Skin: Skin is warm and dry.  Psychiatric: She has a normal mood and affect.    ED Course  Procedures (including critical care time) Labs Review Labs Reviewed  WET PREP, GENITAL - Abnormal; Notable for the following:    WBC, Wet Prep HPF POC FEW (*)    All other components within normal limits  CBC WITH DIFFERENTIAL - Abnormal; Notable for the following:    RBC 3.85 (*)    Hemoglobin 11.5 (*)    HCT 35.3 (*)    All other components within normal limits  BASIC METABOLIC PANEL - Abnormal; Notable for the following:    Glucose, Bld 126 (*)    All other components within normal limits  URINALYSIS, ROUTINE W REFLEX MICROSCOPIC - Abnormal; Notable for the following:    APPearance CLOUDY (*)    Hgb urine dipstick TRACE (*)    All other components within normal limits  GC/CHLAMYDIA PROBE AMP  URINE CULTURE  URINE MICROSCOPIC-ADD ON  HIV ANTIBODY (ROUTINE TESTING)    Imaging Review Dg Chest 2 View  03/21/2014   CLINICAL DATA:  Cough and sore throat  EXAM: CHEST  2 VIEW  COMPARISON:   10/07/2013  FINDINGS: The heart size and mediastinal contours are within normal limits. Both lungs are clear. The visualized skeletal structures are unremarkable.  IMPRESSION: No active cardiopulmonary disease.   Electronically Signed   By: Conchita Paris M.D.   On: 03/21/2014 10:57     EKG Interpretation None      MDM   Final diagnoses:  Bronchitis  Tinea pedis of both feet    Pt is a 53 y.o. female with Pmhx as above who presents with 3 days of sore throat, productive cough w/ foul smelling sputum, chest tightness. She has secondary complaints of itchy, scaling feet, a fishy smelling vaginal d/c w/o pain.  On PE, she is mildy tachycardic (110 on my exam), abdomen, benign, lungs clear, mild posterior oropharyngeal erythema w/o edema or exudates. CXR nml. CBC, BMP, UA, noncontributory. Pelvic unremarkable w/  wet prep with few WBCs.  Suspect viral bronchitis. Will start short course of PO pred. Will also given lotramin cream for athlete's foot. Return precautions given for new or worsening symptoms including worsening SOB, fever, inability to tolerate PO.          Neta Ehlers, MD 03/21/14 1153

## 2014-03-22 LAB — URINE CULTURE
Colony Count: NO GROWTH
Culture: NO GROWTH
Special Requests: NORMAL

## 2014-03-22 LAB — GC/CHLAMYDIA PROBE AMP
CT Probe RNA: NEGATIVE
GC Probe RNA: NEGATIVE

## 2014-03-24 ENCOUNTER — Emergency Department (HOSPITAL_COMMUNITY)
Admission: EM | Admit: 2014-03-24 | Discharge: 2014-03-24 | Disposition: A | Discharge status: Home / Self Care | Payer: Medicare Other | Attending: Emergency Medicine | Admitting: Emergency Medicine

## 2014-03-24 ENCOUNTER — Encounter (HOSPITAL_COMMUNITY): Payer: Self-pay | Admitting: Emergency Medicine

## 2014-03-24 DIAGNOSIS — Z862 Personal history of diseases of the blood and blood-forming organs and certain disorders involving the immune mechanism: Secondary | ICD-10-CM | POA: Insufficient documentation

## 2014-03-24 DIAGNOSIS — R059 Cough, unspecified: Secondary | ICD-10-CM | POA: Insufficient documentation

## 2014-03-24 DIAGNOSIS — R05 Cough: Secondary | ICD-10-CM | POA: Diagnosis present

## 2014-03-24 DIAGNOSIS — J209 Acute bronchitis, unspecified: Secondary | ICD-10-CM | POA: Insufficient documentation

## 2014-03-24 DIAGNOSIS — Z79899 Other long term (current) drug therapy: Secondary | ICD-10-CM | POA: Diagnosis not present

## 2014-03-24 DIAGNOSIS — Z8659 Personal history of other mental and behavioral disorders: Secondary | ICD-10-CM | POA: Diagnosis not present

## 2014-03-24 DIAGNOSIS — Z87891 Personal history of nicotine dependence: Secondary | ICD-10-CM | POA: Diagnosis not present

## 2014-03-24 DIAGNOSIS — J029 Acute pharyngitis, unspecified: Secondary | ICD-10-CM | POA: Diagnosis not present

## 2014-03-24 DIAGNOSIS — Z8742 Personal history of other diseases of the female genital tract: Secondary | ICD-10-CM | POA: Insufficient documentation

## 2014-03-24 DIAGNOSIS — IMO0002 Reserved for concepts with insufficient information to code with codable children: Secondary | ICD-10-CM | POA: Insufficient documentation

## 2014-03-24 DIAGNOSIS — Z8679 Personal history of other diseases of the circulatory system: Secondary | ICD-10-CM | POA: Insufficient documentation

## 2014-03-24 DIAGNOSIS — J4531 Mild persistent asthma with (acute) exacerbation: Secondary | ICD-10-CM

## 2014-03-24 MED ORDER — DEXTROMETHORPHAN POLISTIREX 30 MG/5ML PO LQCR: 30.0000 mg | ORAL | Status: DC | PRN

## 2014-03-24 MED ORDER — AZITHROMYCIN 250 MG PO TABS: 250.0000 mg | ORAL_TABLET | Freq: Every day | ORAL | Status: DC

## 2014-03-24 NOTE — ED Notes (Signed)
Per pt sts that she has been on steroids and abx for cough. sts since Sunday and not better. sts she has been coughing up mucous.

## 2014-03-24 NOTE — Discharge Instructions (Signed)
Follow up with your doctor for further evaluation. Take azithromycin as directed until gone. Take delsym for cough. Refer to attached documents for more information.

## 2014-03-24 NOTE — ED Provider Notes (Signed)
CSN: [id number]     Arrival date & time 03/24/14  1148 History  This chart was scribed Alvina Chou, PA-C, working with Arbie Cookey, MD, by Girtha Hake, ED Scribe. The patient was seen in TR10C/TR10C. The patient's care was started at 12:26 PM.     Chief Complaint  Patient presents with  . Cough   Patient is a 53 y.o. female presenting with cough. The history is provided by the patient. No language interpreter was used.  Cough Associated symptoms: sore throat   HPI Comments: Nicole [name] is a 53 y.o. female who presents to the Emergency Department complaining of a sore throat beginning six days ago. Patient reports an associated cough that produces yellow sputum. She states that she visited the ED at [facility] 3 days ago for the same symptoms. She was given prednisone but reports that she has taken it with no relief of her symptoms. Patient reports that she last smoke a cigarette over a year ago.  PCP is Dr. Tomma Lightning.  Past Medical History  Diagnosis Date  . Perimenopause   . Palpitations     negative cardiac w/u per Dr. Verl Blalock 2/09  . Anemia   . Depression     bipolar  . History of migraine headaches     headaches reported as migraines   Past Surgical History  Procedure Laterality Date  . Tubal ligation  1987  . Exploratory laparotomy  2000    x2 (Dr. Kendall Flack)  . Herpes simplex virus dfa     Family History  Problem Relation Age of Onset  . Hypertension Mother   . Diabetes Mother   . Heart disease Mother   . Thyroid disease Mother   . Breast cancer Maternal Aunt   . Pancreatic cancer Maternal Uncle   . Pancreatic cancer Other     family history  . Colon cancer Neg Hx    History  Substance Use Topics  . Smoking status: Former Smoker -- 0.60 packs/day for 30 years    Types: Cigarettes    Quit date: 07/20/2011  . Smokeless tobacco: Never Used     Comment: Few  . Alcohol Use: Yes     Comment: Rare glass of wine   OB History   Grav Para  Term Preterm Abortions TAB SAB Ect Mult Living                 Review of Systems  HENT: Positive for sore throat.   Respiratory: Positive for cough.   All other systems reviewed and are negative.     Allergies  Other  Home Medications   Prior to Admission medications   Medication Sig Start Date End Date Taking? Authorizing Provider  clotrimazole (LOTRIMIN) 1 % cream Apply to affected area 2 times daily 03/21/14   Ernestina Patches, MD  predniSONE (DELTASONE) 20 MG tablet 3 tabs po day one, then 2 po daily x 4 days 03/21/14   Ernestina Patches, MD   Triage Vitals: BP 133/75  Pulse 95  Temp(Src) 98.4 F (36.9 C)  Resp 18  SpO2 98%  LMP 02/17/2011 Physical Exam  Nursing note and vitals reviewed. Constitutional: She is oriented to person, place, and time. She appears well-developed and well-nourished. No distress.  HENT:  Head: Normocephalic and atraumatic.  Eyes: Conjunctivae and EOM are normal.  Neck: Normal range of motion.  Cardiovascular: Normal rate and regular rhythm.  Exam reveals no gallop and no friction rub.   No murmur  heard. Pulmonary/Chest: Effort normal and breath sounds normal. She has no wheezes. She has no rales. She exhibits no tenderness.  Musculoskeletal: Normal range of motion.  Neurological: She is alert and oriented to person, place, and time. Coordination normal.  Speech is goal-oriented. Moves limbs without ataxia.   Skin: Skin is warm and dry.  Psychiatric: She has a normal mood and affect. Her behavior is normal.    ED Course  Procedures (including critical care time) DIAGNOSTIC STUDIES: Oxygen Saturation is 98% on room air, normal by my interpretation.    COORDINATION OF CARE: 12:30 PM-Discussed treatment plan which includes Delsym and Zithromax Z-Pak with pt at bedside and pt agreed to plan.     Labs Review Labs Reviewed - No data to display  Imaging Review No results found.   EKG Interpretation None      MDM   Final diagnoses:   Bronchitis, allergic, mild persistent, with acute exacerbation    Patient will have azithromycin and delsym for symptoms. Vitals stable and patient afebrile. No further evaluation needed at this time.   I personally performed the services described in this documentation, which was scribed in my presence. The recorded information has been reviewed and is accurate.    Alvina Chou, PA-C 03/24/14 [phone]

## 2014-03-24 NOTE — ED Provider Notes (Signed)
Medical screening examination/treatment/procedure(s) were performed by non-physician practitioner and as supervising physician I was immediately available for consultation/collaboration.   Arbie Cookey, MD 03/24/14 [phone]

## 2014-03-27 ENCOUNTER — Encounter (HOSPITAL_COMMUNITY): Payer: Self-pay | Admitting: Emergency Medicine

## 2014-03-27 ENCOUNTER — Emergency Department (HOSPITAL_COMMUNITY): Payer: Medicare Other

## 2014-03-27 ENCOUNTER — Emergency Department (HOSPITAL_COMMUNITY)
Admission: EM | Admit: 2014-03-27 | Discharge: 2014-03-27 | Disposition: A | Discharge status: Home / Self Care | Payer: Medicare Other | Attending: Emergency Medicine | Admitting: Emergency Medicine

## 2014-03-27 DIAGNOSIS — R071 Chest pain on breathing: Secondary | ICD-10-CM | POA: Insufficient documentation

## 2014-03-27 DIAGNOSIS — Z8742 Personal history of other diseases of the female genital tract: Secondary | ICD-10-CM | POA: Insufficient documentation

## 2014-03-27 DIAGNOSIS — J029 Acute pharyngitis, unspecified: Secondary | ICD-10-CM | POA: Diagnosis not present

## 2014-03-27 DIAGNOSIS — Z862 Personal history of diseases of the blood and blood-forming organs and certain disorders involving the immune mechanism: Secondary | ICD-10-CM | POA: Diagnosis not present

## 2014-03-27 DIAGNOSIS — R110 Nausea: Secondary | ICD-10-CM | POA: Insufficient documentation

## 2014-03-27 DIAGNOSIS — Z87891 Personal history of nicotine dependence: Secondary | ICD-10-CM | POA: Insufficient documentation

## 2014-03-27 DIAGNOSIS — J069 Acute upper respiratory infection, unspecified: Secondary | ICD-10-CM | POA: Insufficient documentation

## 2014-03-27 DIAGNOSIS — R05 Cough: Secondary | ICD-10-CM

## 2014-03-27 DIAGNOSIS — R0789 Other chest pain: Secondary | ICD-10-CM

## 2014-03-27 DIAGNOSIS — Z8659 Personal history of other mental and behavioral disorders: Secondary | ICD-10-CM | POA: Insufficient documentation

## 2014-03-27 DIAGNOSIS — Z8679 Personal history of other diseases of the circulatory system: Secondary | ICD-10-CM | POA: Insufficient documentation

## 2014-03-27 DIAGNOSIS — R059 Cough, unspecified: Secondary | ICD-10-CM | POA: Insufficient documentation

## 2014-03-27 DIAGNOSIS — IMO0001 Reserved for inherently not codable concepts without codable children: Secondary | ICD-10-CM | POA: Diagnosis not present

## 2014-03-27 DIAGNOSIS — Z792 Long term (current) use of antibiotics: Secondary | ICD-10-CM | POA: Diagnosis not present

## 2014-03-27 MED ORDER — HYDROCODONE-ACETAMINOPHEN 7.5-325 MG/15ML PO SOLN: 10.0000 mL | Freq: Four times a day (QID) | ORAL | Status: DC | PRN

## 2014-03-27 NOTE — ED Provider Notes (Signed)
Medical screening examination/treatment/procedure(s) were performed by non-physician practitioner and as supervising physician I was immediately available for consultation/collaboration.   EKG Interpretation   Date/Time:  Saturday March 27 2014 13:56:52 EDT Ventricular Rate:  87 PR Interval:  142 QRS Duration: 66 QT Interval:  356 QTC Calculation: 428 R Axis:   44 Text Interpretation:  Normal sinus rhythm Possible Left atrial enlargement  Borderline ECG Confirmed by Icey Tello,  DO, Natara Monfort ([id number]) on 03/27/2014 2:06:53  PM        Cottle, DO 03/27/14 1535

## 2014-03-27 NOTE — Discharge Instructions (Signed)
Chest Wall Pain °Chest wall pain is pain felt in or around the chest bones and muscles. It may take up to 6 weeks to get better. It may take longer if you are active. Chest wall pain can happen on its own. Other times, things like germs, injury, coughing, or exercise can cause the pain. °HOME CARE  °· Avoid activities that make you tired or cause pain. Try not to use your chest, belly (abdominal), or side muscles. Do not use heavy weights. °· Put ice on the sore area. °¨ Put ice in a plastic bag. °¨ Place a towel between your skin and the bag. °¨ Leave the ice on for 15-20 minutes for the first 2 days. °· Only take medicine as told by your doctor. °GET HELP RIGHT AWAY IF:  °· You have more pain or are very uncomfortable. °· You have a fever. °· Your chest pain gets worse. °· You have new problems. °· You feel sick to your stomach (nauseous) or throw up (vomit). °· You start to sweat or feel lightheaded. °· You have a cough with mucus (phlegm). °· You cough up blood. °MAKE SURE YOU:  °· Understand these instructions. °· Will watch your condition. °· Will get help right away if you are not doing well or get worse. °Document Released: 01/23/2008 Document Revised: 10/29/2011 Document Reviewed: 04/02/2011 °[employer]® Patient Information ©2015 [employer]. This information is not intended to replace advice given to you by your health care provider. Make sure you discuss any questions you have with your health care provider. ° °

## 2014-03-27 NOTE — ED Provider Notes (Signed)
CSN: [id number]     Arrival date & time 03/27/14  1336 History  This chart was scribed for non-physician practitioner working with Fallon Station, DO, by Jeanell Sparrow, ED Scribe. This patient was seen in room TR08C/TR08C and the patient's care was started at 2:43 PM.  Chief Complaint  Patient presents with  . Cough  . rib pain    The history is provided by the patient. No language interpreter was used.   HPI Comments: Nicole [name] is a 53 y.o. female who presents to the Emergency Department complaining of intermittent moderate rib pain that started 6 days ago. She states that she was diagnosed with bronchitis 6 days ago and given an antibiotic. She reports associated productive intermittent cough. She state that her pain is only present when she coughs. She rates the severity of her pain as a 8/10. She states she took OTC cough medication with relief. She reports that she has a subjective fever and some nausea that is currently not present. She states that she has sick contacts. She denies any hx of asthma. She also denies any emesis or diarrhea.   Past Medical History  Diagnosis Date  . Perimenopause   . Palpitations     negative cardiac w/u per Dr. Verl Blalock 2/09  . Anemia   . Depression     bipolar  . History of migraine headaches     headaches reported as migraines   Past Surgical History  Procedure Laterality Date  . Tubal ligation  1987  . Exploratory laparotomy  2000    x2 (Dr. Kendall Flack)  . Herpes simplex virus dfa     Family History  Problem Relation Age of Onset  . Hypertension Mother   . Diabetes Mother   . Heart disease Mother   . Thyroid disease Mother   . Breast cancer Maternal Aunt   . Pancreatic cancer Maternal Uncle   . Pancreatic cancer Other     family history  . Colon cancer Neg Hx    History  Substance Use Topics  . Smoking status: Former Smoker -- 0.60 packs/day for 30 years    Types: Cigarettes    Quit date: 07/20/2011  . Smokeless tobacco: Never  Used     Comment: Few  . Alcohol Use: Yes     Comment: Rare glass of wine   OB History   Grav Para Term Preterm Abortions TAB SAB Ect Mult Living                 Review of Systems  Constitutional: Positive for fever. Negative for chills.  HENT: Positive for sore throat and voice change. Negative for trouble swallowing.   Respiratory: Positive for cough.   Gastrointestinal: Positive for nausea. Negative for vomiting and diarrhea.  Musculoskeletal: Positive for myalgias.  All other systems reviewed and are negative.   Allergies  Other  Home Medications   Prior to Admission medications   Medication Sig Start Date End Date Taking? Authorizing Provider  azithromycin (ZITHROMAX) 250 MG tablet Take 250-500 mg by mouth daily. *takes 500mg  day 1, then 250mg  days 2-5*   Yes Historical Provider, MD  DiphenhydrAMINE HCl (MULTI-SYMPTOM ALLERGY PO) Take 1 capsule by mouth daily as needed (for cough/cold).   Yes Historical Provider, MD  HYDROcodone-acetaminophen (HYCET) 7.5-325 mg/15 ml solution Take 10 mLs by mouth every 6 (six) hours as needed for moderate pain or severe pain. 03/27/14   Noland Fordyce, PA-C   BP 109/76  Pulse 94  Temp(Src) 98.6 F (37 C) (Oral)  Ht 5\' 5"  (1.651 m)  Wt 195 lb (88.451 kg)  BMI 32.45 kg/m2  SpO2 96%  LMP 02/17/2011 Physical Exam  Nursing note and vitals reviewed. Constitutional: She is oriented to person, place, and time. She appears well-developed and well-nourished.  HENT:  Head: Normocephalic and atraumatic.  Eyes: EOM are normal.  Neck: Normal range of motion.  Cardiovascular: Normal rate, regular rhythm and normal heart sounds.  Exam reveals no gallop and no friction rub.   No murmur heard. Pulmonary/Chest: Effort normal and breath sounds normal. No respiratory distress. She has no wheezes. She has no rales. She exhibits tenderness.  Left sided chest tenderness  Musculoskeletal: Normal range of motion.  Neurological: She is alert and oriented to  person, place, and time.  Skin: Skin is warm and dry.  Psychiatric: She has a normal mood and affect. Her behavior is normal.    ED Course  Procedures (including critical care time) DIAGNOSTIC STUDIES: Oxygen Saturation is 96% on RA, normal by my interpretation.    COORDINATION OF CARE: 2:47 PM- Pt advised of plan for treatment which includes radiology and pt agrees.  Labs Review Labs Reviewed - No data to display  Imaging Review Dg Chest 2 View  03/27/2014   CLINICAL DATA:  Recent fever.  EXAM: CHEST  2 VIEW  COMPARISON:  03/21/2014  FINDINGS: Two views of the chest demonstrate slightly decreased lung volumes. Few densities at the lung bases are suggestive for atelectasis. Heart and mediastinum are within normal limits. The trachea is midline. No evidence for pleural effusions or airspace disease.  IMPRESSION: Slightly low lung volumes with basilar atelectasis. No focal airspace disease.   Electronically Signed   By: Markus Daft M.D.   On: 03/27/2014 15:13     EKG Interpretation   Date/Time:  Saturday March 27 2014 13:56:52 EDT Ventricular Rate:  87 PR Interval:  142 QRS Duration: 66 QT Interval:  356 QTC Calculation: 428 R Axis:   44 Text Interpretation:  Normal sinus rhythm Possible Left atrial enlargement  Borderline ECG Confirmed by WARD,  DO, KRISTEN ([id number]) on 03/27/2014 2:06:53  PM      MDM   Final diagnoses:  URI, acute  Cough  Left-sided chest wall pain    Pt is a 53yo female recently dx with bronchitis Aug 2nd, has been taking azithromycin, started on 8/6.  Pt still c/o cough, congestion, and now left sided chest wall pain.  O2-96% in RA.  Mild tenderness on exam.  Lungs: CTAB.  CXR: slightly low lung volumves with basilar atelectasis. No focal airspace disease.  Will tx with hycet for cough and pain. Advised to continue taking antibiotics until complete. Note for 2 days of bed rest and fluids provided as pt states she is staying in a shelter.  Return precautions  provided. Pt verbalized understanding and agreement with tx plan.   I personally performed the services described in this documentation, which was scribed in my presence. The recorded information has been reviewed and is accurate.    Noland Fordyce, PA-C 03/27/14 1534

## 2014-03-27 NOTE — ED Notes (Signed)
Seen 03-24-14 for bronchitis started on zpack.  Could not afford cough med.  Took mutli cough/cold med today with some relief in cough.  Cough still hoarse and ribs painful when coughing. Talking in complete sentences.   No respiratory difficulties.  Lungs clear.

## 2014-03-27 NOTE — ED Notes (Signed)
Pt was diagnosed with bronchitis on Mar 21, 2014 and prescribed an antibiotic. She states she is still having a productive cough and her ribs are painful from coughing.  Pt states her pain is 8/10 in her ribs when coughing.

## 2014-03-27 NOTE — ED Notes (Signed)
Declined W/C at D/C and was escorted to lobby by RN. 

## 2014-04-27 ENCOUNTER — Encounter: Payer: Self-pay | Admitting: Internal Medicine

## 2014-05-06 ENCOUNTER — Encounter (HOSPITAL_COMMUNITY): Payer: Self-pay | Admitting: Emergency Medicine

## 2014-05-06 ENCOUNTER — Emergency Department (HOSPITAL_COMMUNITY)
Admission: EM | Admit: 2014-05-06 | Discharge: 2014-05-06 | Disposition: A | Discharge status: Home / Self Care | Payer: Medicare Other | Attending: Emergency Medicine | Admitting: Emergency Medicine

## 2014-05-06 DIAGNOSIS — Z87891 Personal history of nicotine dependence: Secondary | ICD-10-CM | POA: Diagnosis not present

## 2014-05-06 DIAGNOSIS — R1033 Periumbilical pain: Secondary | ICD-10-CM | POA: Insufficient documentation

## 2014-05-06 DIAGNOSIS — Z8659 Personal history of other mental and behavioral disorders: Secondary | ICD-10-CM | POA: Insufficient documentation

## 2014-05-06 DIAGNOSIS — Z79899 Other long term (current) drug therapy: Secondary | ICD-10-CM | POA: Insufficient documentation

## 2014-05-06 DIAGNOSIS — Z9851 Tubal ligation status: Secondary | ICD-10-CM | POA: Insufficient documentation

## 2014-05-06 DIAGNOSIS — Z862 Personal history of diseases of the blood and blood-forming organs and certain disorders involving the immune mechanism: Secondary | ICD-10-CM | POA: Diagnosis not present

## 2014-05-06 DIAGNOSIS — Z8679 Personal history of other diseases of the circulatory system: Secondary | ICD-10-CM | POA: Diagnosis not present

## 2014-05-06 DIAGNOSIS — Z8742 Personal history of other diseases of the female genital tract: Secondary | ICD-10-CM | POA: Diagnosis not present

## 2014-05-06 DIAGNOSIS — K429 Umbilical hernia without obstruction or gangrene: Secondary | ICD-10-CM | POA: Diagnosis not present

## 2014-05-06 MED ORDER — HYDROCODONE-ACETAMINOPHEN 5-325 MG PO TABS
1.0000 | ORAL_TABLET | ORAL | Status: DC | PRN
Start: 2014-05-06 — End: 2014-06-17

## 2014-05-06 NOTE — ED Notes (Signed)
Pt reports 2/45 pain to umbilical hernia x 2 days. States she has had hernia for 3 months. Denies N/V/D. NAD.

## 2014-05-06 NOTE — ED Provider Notes (Signed)
53 year old female has had an umbilical hernia and was concerned about it. It does cause pain intermittently. Her physician told her that she shouldn't exercise because of the possibility of making a larger. She states it is only mildly sore. It has not been enlarging. On exam, there is a small, easily reducible hernia on the right side of the umbilicus. Patient was reassured that she can do normal activity without causing any problem with her hernia. She is referred to [facility] surgery for a followup opinion.  I saw and evaluated the patient, reviewed the resident's note and I agree with the findings and plan.    Delora Fuel, MD 09/62/83 [id number]

## 2014-05-06 NOTE — ED Provider Notes (Signed)
CSN: [id number]     Arrival date & time 05/06/14  1417 History   First MD Initiated Contact with Patient 05/06/14 1716     Chief Complaint  Patient presents with  . Hernia    Patient is a 53 y.o. female presenting with abdominal pain. The history is provided by the patient.  Abdominal Pain Pain location:  Periumbilical Pain quality: aching   Pain radiates to:  Does not radiate Pain severity:  Mild Onset quality:  Gradual Duration:  2 days Timing:  Constant Progression:  Worsening Chronicity:  Recurrent Context comment:  Standing up, exercise Relieved by: laying flat. Exacerbated by: standing. Associated symptoms: no anorexia, no chest pain, no chills, no constipation, no cough, no diarrhea, no dysuria, no fever, no hematemesis, no hematochezia, no hematuria, no nausea, no shortness of breath and no vomiting   Risk factors: multiple surgeries    Has umbilical hernia for past 3 months, became more painful over course of past few days but it reduces when she lays flat.  No skin changes.   Past Medical History  Diagnosis Date  . Perimenopause   . Palpitations     negative cardiac w/u per Dr. Verl Blalock 2/09  . Anemia   . Depression     bipolar  . History of migraine headaches     headaches reported as migraines   Past Surgical History  Procedure Laterality Date  . Tubal ligation  1987  . Exploratory laparotomy  2000    x2 (Dr. Kendall Flack)  . Herpes simplex virus dfa     Family History  Problem Relation Age of Onset  . Hypertension Mother   . Diabetes Mother   . Heart disease Mother   . Thyroid disease Mother   . Breast cancer Maternal Aunt   . Pancreatic cancer Maternal Uncle   . Pancreatic cancer Other     family history  . Colon cancer Neg Hx    History  Substance Use Topics  . Smoking status: Former Smoker -- 0.60 packs/day for 30 years    Types: Cigarettes    Quit date: 07/20/2011  . Smokeless tobacco: Never Used     Comment: Few  . Alcohol Use: Yes   Comment: Rare glass of wine   OB History   Grav Para Term Preterm Abortions TAB SAB Ect Mult Living                 Review of Systems  Constitutional: Negative for fever and chills.  Respiratory: Negative for cough, shortness of breath and wheezing.   Cardiovascular: Negative for chest pain.  Gastrointestinal: Positive for abdominal pain. Negative for nausea, vomiting, diarrhea, constipation, hematochezia, abdominal distention, anorexia and hematemesis.  Genitourinary: Negative for dysuria and hematuria.  Musculoskeletal: Negative for back pain.  Skin: Negative for rash.  All other systems reviewed and are negative.     Allergies  Other  Home Medications   Prior to Admission medications   Medication Sig Start Date End Date Taking? Authorizing Provider  azithromycin (ZITHROMAX) 250 MG tablet Take 250-500 mg by mouth daily. *takes 500mg  day 1, then 250mg  days 2-5*    Historical Provider, MD  DiphenhydrAMINE HCl (MULTI-SYMPTOM ALLERGY PO) Take 1 capsule by mouth daily as needed (for cough/cold).    Historical Provider, MD  HYDROcodone-acetaminophen (HYCET) 7.5-325 mg/15 ml solution Take 10 mLs by mouth every 6 (six) hours as needed for moderate pain or severe pain. 03/27/14   Noland Fordyce, PA-C   LMP 02/17/2011 Physical Exam  Nursing note and vitals reviewed. Constitutional: She is oriented to person, place, and time. She appears well-developed and well-nourished. No distress.  HENT:  Head: Normocephalic and atraumatic.  Nose: Nose normal.  Eyes: Conjunctivae are normal.  Neck: Normal range of motion. Neck supple. No tracheal deviation present.  Cardiovascular: Normal rate, regular rhythm and normal heart sounds.   No murmur heard. Pulmonary/Chest: Effort normal and breath sounds normal. No respiratory distress. She has no rales.  Abdominal: Soft. Bowel sounds are normal. She exhibits no distension and no mass. There is tenderness.  Easily reducible hernia palpated to right  side of umbilicus. Only TTP with deep palpation.  No skin changes  Musculoskeletal: Normal range of motion. She exhibits no edema.  Neurological: She is alert and oriented to person, place, and time.  Skin: Skin is warm and dry. No rash noted.  Psychiatric: She has a normal mood and affect.    ED Course  Procedures (including critical care time) Labs Review Labs Reviewed - No data to display  Imaging Review No results found.   EKG Interpretation None      MDM   Final diagnoses:  Umbilical hernia without obstruction and without gangrene    No signs of peritonitis or systemic toxicity to suggest surgical emergency.  Well appearing. VSS. Very minimal TTP with very deep palpation.  No obvious bowel hernia incarceration.  Likely omental.  No imaging indicated. Sx control. D/c home with referral to surgery.  Recommend it is OK to resume exercise per patient concern.   Tammy Sours, MD 05/07/14 [phone]

## 2014-06-11 ENCOUNTER — Other Ambulatory Visit (INDEPENDENT_AMBULATORY_CARE_PROVIDER_SITE_OTHER): Payer: Self-pay | Admitting: General Surgery

## 2014-06-11 DIAGNOSIS — K432 Incisional hernia without obstruction or gangrene: Secondary | ICD-10-CM

## 2014-06-15 ENCOUNTER — Other Ambulatory Visit: Payer: Medicare Other

## 2014-06-16 ENCOUNTER — Other Ambulatory Visit: Payer: Medicare Other

## 2014-06-17 ENCOUNTER — Ambulatory Visit (INDEPENDENT_AMBULATORY_CARE_PROVIDER_SITE_OTHER): Payer: Medicare Other | Admitting: Diagnostic Neuroimaging

## 2014-06-17 ENCOUNTER — Encounter: Payer: Self-pay | Admitting: Diagnostic Neuroimaging

## 2014-06-17 VITALS — BP 108/74 | HR 106 | Temp 98.2°F | Ht 65.5 in | Wt 200.6 lb

## 2014-06-17 DIAGNOSIS — G43009 Migraine without aura, not intractable, without status migrainosus: Secondary | ICD-10-CM

## 2014-06-17 NOTE — Patient Instructions (Signed)
Keep up the good work

## 2014-06-17 NOTE — Progress Notes (Signed)
PATIENT: Nicole [name] DOB: [birth year]  REASON FOR VISIT: routine follow up for Migraines HISTORY FROM: patient  Chief Complaint  Patient presents with  . Follow-up    migraine     HISTORY OF PRESENT ILLNESS:  UPDATE 06/17/14 (VRP): Since last visit, doing well. No headaches. Not needing triptan. Stress is much improved, and this seems to be helping her headaches. Planning to get CPAP machine soon.   UPDATE 12/16/13 (LL): She returns for migraine follow up, headache are some improved, did not try triptan.  She is concerned that she has sleep apnea and that this is causing her headaches.  She has family reports that she has long apnea pauses when she sleeps, history of snoring, ESS is 20 and FSS is 46 today.    PRIOR HPI: 53 year old right-handed female here for evaluation of headaches. Patient does have headaches since age 29 years old, yearly headaches, with frontal, unilateral sometimes bilateral throbbing pressure sensation. Sometimes associated with stomach pain, photophobia and phonophobia. No nausea or vomiting. Over her life, in the last one to 2 years symptoms have changed. Now she has pinching and hair pulling sensation of the back of her head. These are more significant during periods of stress. From October to December 2014 patient was under higher stress with worse symptoms. In January 2015 symptoms have significantly improved. Patient has tried oxycodone/Tylenol in the past with mild relief. She's not been officially diagnosed with migraine in the past. She's not officially taken migraine medication in the past. No family history of migraine.    REVIEW OF SYSTEMS: Full 14 system review of systems performed and notable only for sleepiness snoring dizziness apnea swollen abd rectal pain.    ALLERGIES: Allergies  Allergen Reactions  . Other Itching    Pain medication (Patient unsure of medication name)    HOME MEDICATIONS: Outpatient Prescriptions Prior to Visit    Medication Sig Dispense Refill  . DiphenhydrAMINE HCl (MULTI-SYMPTOM ALLERGY PO) Take 1 capsule by mouth daily as needed (for cough/cold).      Marland Kitchen HYDROcodone-acetaminophen (HYCET) 7.5-325 mg/15 ml solution Take 10 mLs by mouth every 6 (six) hours as needed for moderate pain or severe pain.  120 mL  0  . HYDROcodone-acetaminophen (NORCO/VICODIN) 5-325 MG per tablet Take 1 tablet by mouth every 4 (four) hours as needed for moderate pain or severe pain.  6 tablet  0   No facility-administered medications prior to visit.     PHYSICAL EXAM  Filed Vitals:   06/17/14 1317  BP: 108/74  Pulse: 106  Temp: 98.2 F (36.8 C)  TempSrc: Oral  Height: 5' 5.5" (1.664 m)  Weight: 200 lb 9.6 oz (90.992 kg)   Body mass index is 32.86 kg/(m^2).   Not recorded   GENERAL EXAM: Patient is in no distress; well developed, nourished and groomed; neck is supple  CARDIOVASCULAR: Regular rate and rhythm, no murmurs  NEUROLOGIC: MENTAL STATUS: awake, alert, language fluent, comprehension intact, naming intact, fund of knowledge appropriate; CHEERFUL, PLEASANT CRANIAL NERVE: no papilledema on fundoscopic exam, pupils equal and reactive to light, visual fields full to confrontation, extraocular muscles intact, no nystagmus, facial sensation and strength symmetric, hearing intact, palate elevates symmetrically, uvula midline, shoulder shrug symmetric, tongue midline. MOTOR: normal bulk and tone, full strength in the BUE, BLE SENSORY: normal and symmetric to light touch, pinprick, temperature, vibration COORDINATION: finger-nose-finger, fine finger movements normal REFLEXES: deep tendon reflexes present and symmetric GAIT/STATION: narrow based gait; able to walk tandem; romberg  is negative    DIAGNOSTICS/IMAGING  12/19/13 MRI brain - normal    ASSESSMENT AND PLAN  53 y.o. female here with migraine without aura since age [age] years old. Symptoms worse with stress. Symptoms changing slightly in 2013-2014,  but improving in 2015.   PLAN:  - sumatriptan prn if migraines worsen - follow up with CPAP treatment  Return in about 6 months (around 12/17/2014).     Penni Bombard, MD 41/63/8453, 6:46 PM Certified in Neurology, Neurophysiology and Neuroimaging  [facility] [address] [place], [place] [address] [phone]

## 2014-06-18 ENCOUNTER — Ambulatory Visit
Admission: RE | Admit: 2014-06-18 | Discharge: 2014-06-18 | Disposition: A | Discharge status: Home / Self Care | Payer: Medicare Other | Source: Ambulatory Visit | Attending: General Surgery | Admitting: General Surgery

## 2014-06-18 ENCOUNTER — Telehealth (INDEPENDENT_AMBULATORY_CARE_PROVIDER_SITE_OTHER): Payer: Self-pay

## 2014-06-18 DIAGNOSIS — K432 Incisional hernia without obstruction or gangrene: Secondary | ICD-10-CM

## 2014-06-18 MED ORDER — IOHEXOL 300 MG/ML  SOLN
125.0000 mL | Freq: Once | INTRAMUSCULAR | Status: AC | PRN
  Administered 2014-06-18: 125 mL via INTRAVENOUS

## 2014-06-18 NOTE — Telephone Encounter (Signed)
LMOM for pt to call back to get xray report from Dr Dalbert Batman below.

## 2014-06-18 NOTE — Telephone Encounter (Signed)
Message copied by Dois Davenport on Fri Jun 18, 2014  4:40 PM ------      Message from: Adin Hector      Created: Fri Jun 18, 2014  3:41 PM       Call radiology reports to patient. Small fat containing umbilical  hernia seen.  Constipation suggested.   No immediate threat.   Will discuss surgery for pain at next OV.            hmi             ------

## 2014-06-28 ENCOUNTER — Ambulatory Visit: Payer: Medicare Other | Admitting: Internal Medicine

## 2014-06-29 ENCOUNTER — Encounter: Payer: Self-pay | Admitting: Internal Medicine

## 2014-07-12 ENCOUNTER — Telehealth: Payer: Self-pay | Admitting: Internal Medicine

## 2014-07-12 NOTE — Telephone Encounter (Signed)
Patient will come in and see Tye Savoy RNP on 07/20/14 9:30

## 2014-07-20 ENCOUNTER — Ambulatory Visit (INDEPENDENT_AMBULATORY_CARE_PROVIDER_SITE_OTHER): Payer: Medicare Other | Admitting: Nurse Practitioner

## 2014-07-20 ENCOUNTER — Encounter: Payer: Self-pay | Admitting: Nurse Practitioner

## 2014-07-20 VITALS — BP 110/70 | HR 60 | Ht 65.0 in | Wt 193.4 lb

## 2014-07-20 DIAGNOSIS — K6289 Other specified diseases of anus and rectum: Secondary | ICD-10-CM

## 2014-07-20 NOTE — Progress Notes (Signed)
     History of Present Illness:   Patient is a 53 year old female known to Dr. Carlean Purl. She was evaluated by him in 2013 for rectal pain. Subsequent colonoscopy was normal . She is back with rectal pain which started one month ago. The pain is worse than ever. Interestingly, it doesn't hurt to defecate but sitting and walking hurt. Pain intermittent.  No rectal bleeding.  Earlier this year patient found to have hematuria, she saw urology, sounds like she had a cystoscopy without significant findings. She is overdue for GYN exam.   Current Medications, Allergies, Past Medical History, Past Surgical History, Family History and Social History were reviewed in [employer] record.  Physical Exam: General: Pleasant, well developed , black female in no acute distress Head: Normocephalic and atraumatic Eyes:  sclerae anicteric, conjunctiva pink  Ears: Normal auditory acuity Lungs: Clear throughout to auscultation Heart: Regular rate and rhythm Abdomen: Soft, non distended, non-tender. No masses, no hepatomegaly. Normal bowel sounds Rectal: No external lesions. No stool in vault. Minimally enlarged internal hemorrhoids. No fissures. DRE not painful. Musculoskeletal: Symmetrical with no gross deformities  Extremities: No edema  Neurological: Alert oriented x 4, grossly nonfocal Psychological:  Alert and cooperative. Normal mood and affect  Assessment and Recommendations:    53 year old female with recurrent rectal pain. Interestingly, she has no pain with defecation. Pain is mainly when sitting and/or walking . Normal colonoscopy for rectal pain March 2013. Patient concerned about possibility of cancer (apparently she had a cousin who was diagnosed with colon cancer). I reassured her that this is very unlikely . Cause of rectal pain not clear.  It is atypical for fissure and I did not see one on anoscopy. Proctalgia fugax possible but pain not typical for that either.    Patient is 2 years overdue for her GYN exam which I encouraged her to get ASAP.   Will forward this to her primary GI, Dr. Carlean Purl to see if he has anything to add (?repeat anorectal exam under anesthesia?)

## 2014-07-20 NOTE — Patient Instructions (Signed)
Please make a follow up visit with your Gynecology doctor   You will hear from a nurse once Dr. Carlean Purl reviews your notes

## 2014-07-21 ENCOUNTER — Encounter: Payer: Self-pay | Admitting: Nurse Practitioner

## 2014-07-21 DIAGNOSIS — K6289 Other specified diseases of anus and rectum: Secondary | ICD-10-CM | POA: Insufficient documentation

## 2014-07-22 NOTE — Progress Notes (Signed)
Agree with GYN evaluation but would also see ortho given pain with sitting and walking.  Does not sound GI in origin. Note had CT abd/pelvis  in Oct which was ok.  Agree with Ms. Guenther's assessment and plan. Gatha Mayer, MD, [facility]

## 2014-07-23 ENCOUNTER — Telehealth: Payer: Self-pay | Admitting: *Deleted

## 2014-07-23 NOTE — Telephone Encounter (Signed)
Status: Signed       Expand All Collapse All   Agree with GYN evaluation but would also see ortho given pain with sitting and walking.  Does not sound GI in origin. Note had CT abd/pelvis in Oct which was ok.  Agree with Ms. Guenther's assessment and plan. Gatha Mayer, MD, [facility]     Left a message for patient to call back.

## 2014-07-23 NOTE — Telephone Encounter (Signed)
Status: Signed       Expand All Collapse All   Agree with GYN evaluation but would also see ortho given pain with sitting and walking.  Does not sound GI in origin. Note had CT abd/pelvis in Oct which was ok.  Agree with Ms. Guenther's assessment and plan. Gatha Mayer, MD, [facility]     Left a message for patient to call back.

## 2014-07-23 NOTE — Telephone Encounter (Signed)
-----   Message from Willia Craze, NP sent at 07/23/2014 10:11 AM EST ----- Rollene Fare, please see Gessner's comments on my note and let patient know that if GYN exam normal that he thinks ortho eval is warranted. He agrees this doesn't sound GI in nature. Thanks    ----- Message -----    From: Gatha Mayer, MD    Sent: 07/22/2014   2:49 PM      To: Willia Craze, NP    ----- Message -----    From: Willia Craze, NP    Sent: 07/21/2014   2:02 PM      To: Gatha Mayer, MD

## 2014-07-26 ENCOUNTER — Ambulatory Visit (INDEPENDENT_AMBULATORY_CARE_PROVIDER_SITE_OTHER): Payer: Medicare Other | Admitting: Neurology

## 2014-07-26 DIAGNOSIS — G4733 Obstructive sleep apnea (adult) (pediatric): Secondary | ICD-10-CM

## 2014-07-26 DIAGNOSIS — G479 Sleep disorder, unspecified: Secondary | ICD-10-CM

## 2014-07-26 NOTE — Telephone Encounter (Signed)
Spoke with patient and gave her recommendation. 

## 2014-07-26 NOTE — Sleep Study (Signed)
Please see the scanned sleep study interpretation located in the Procedure tab within the Chart Review section. 

## 2014-08-04 ENCOUNTER — Ambulatory Visit: Payer: Medicare Other | Admitting: Obstetrics & Gynecology

## 2014-08-04 ENCOUNTER — Telehealth: Payer: Self-pay | Admitting: Neurology

## 2014-08-04 DIAGNOSIS — G4733 Obstructive sleep apnea (adult) (pediatric): Secondary | ICD-10-CM

## 2014-08-04 NOTE — Telephone Encounter (Signed)
Please call and inform patient that I have entered an order for treatment with PAP. She did well during the latest sleep study with CPAP. We will, therefore, arrange for a machine for home use through a DME (durable medical equipment) company of Her choice; and I will see the patient back in follow-up in about 6 weeks. Please also explain to the patient that I will be looking out for compliance data downloaded from the machine, which can be done remotely through a modem at times or stored on an SD card in the back of the machine. At the time of the followup appointment we will discuss sleep study results and how it is going with PAP treatment at home. Please advise patient to bring Her machine at the time of the visit; at least for the first visit, even though this is cumbersome. Bringing the machine for every visit after that may not be needed, but often helps for the first visit. Please also make sure, the patient has a follow-up appointment with me in about 6 weeks from the setup date, thanks.   Oluwaseun Cremer, MD, PhD [facility]  

## 2014-08-09 ENCOUNTER — Encounter: Payer: Self-pay | Admitting: Neurology

## 2014-08-10 ENCOUNTER — Encounter: Payer: Self-pay | Admitting: *Deleted

## 2014-08-10 NOTE — Telephone Encounter (Signed)
Patient was contacted and provided the results of her CPAP titration study.  Patient was informed that CPAP therapy was effective in combating her sleep apnea and that Dr. Rexene Alberts had recommended it for home use.  Patient was understanding and in agreement.  Patient was referred to [facility] for CPAP set up.  The patient gave verbal permission to mail a copy of her test results.   Patient instructed to contact our office 6-8 weeks post set up to schedule a follow up appointment.

## 2014-09-01 DIAGNOSIS — Z7251 High risk heterosexual behavior: Secondary | ICD-10-CM | POA: Diagnosis not present

## 2014-09-01 DIAGNOSIS — L749 Eccrine sweat disorder, unspecified: Secondary | ICD-10-CM | POA: Diagnosis not present

## 2014-09-01 DIAGNOSIS — Z658 Other specified problems related to psychosocial circumstances: Secondary | ICD-10-CM | POA: Diagnosis not present

## 2014-09-30 ENCOUNTER — Other Ambulatory Visit (HOSPITAL_COMMUNITY)
Admission: RE | Admit: 2014-09-30 | Discharge: 2014-09-30 | Disposition: A | Discharge status: Home / Self Care | Payer: Medicare Other | Source: Ambulatory Visit | Attending: Obstetrics and Gynecology | Admitting: Obstetrics and Gynecology

## 2014-09-30 ENCOUNTER — Ambulatory Visit (INDEPENDENT_AMBULATORY_CARE_PROVIDER_SITE_OTHER): Payer: Medicare Other | Admitting: Obstetrics and Gynecology

## 2014-09-30 ENCOUNTER — Encounter: Payer: Self-pay | Admitting: Obstetrics & Gynecology

## 2014-09-30 VITALS — BP 103/67 | HR 77 | Temp 97.6°F | Ht 65.0 in | Wt 192.8 lb

## 2014-09-30 DIAGNOSIS — Z124 Encounter for screening for malignant neoplasm of cervix: Secondary | ICD-10-CM | POA: Diagnosis not present

## 2014-09-30 DIAGNOSIS — K429 Umbilical hernia without obstruction or gangrene: Secondary | ICD-10-CM | POA: Diagnosis not present

## 2014-09-30 DIAGNOSIS — Z1151 Encounter for screening for human papillomavirus (HPV): Secondary | ICD-10-CM | POA: Diagnosis not present

## 2014-09-30 DIAGNOSIS — Z01419 Encounter for gynecological examination (general) (routine) without abnormal findings: Secondary | ICD-10-CM | POA: Diagnosis not present

## 2014-09-30 NOTE — Progress Notes (Signed)
Subjective:     Nicole [name] is a 54 y.o. female postmenopausal for 4 years with BMI 32 who is here for a comprehensive physical exam. The patient reports pain at umbilicus related to a hernia. She describes the pain as being Jaqualyn Juday at times. She denies nausea/emesis. She is not sexually active and denies any history of incontinence.  History   Social History  . Marital Status: Single    Spouse Name: N/A  . Number of Children: 3  . Years of Education: N/A   Occupational History  . 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 Smoking status: Former Smoker -- 0.60 packs/day for 30 years    Types: Cigarettes    Quit date: 07/20/2011  . Smokeless tobacco: Never Used     Comment: Few  . Alcohol Use: Yes     Comment: Rare glass of wine  . Drug Use: No     Comment: Rare marijuana; has been counseled to quit tobacco and marijuana  . Sexual Activity: Not on file   Other Topics Concern  . Not on file   Social History Narrative   Patient lives at home alone.   Caffeine Use: none   Health Maintenance  Topic Date Due  . MAMMOGRAM  12/28/2011  . INFLUENZA VACCINE  03/20/2014  . PAP SMEAR  05/06/2015  . COLONOSCOPY  10/22/2021  . TETANUS/TDAP  10/28/2022   Past Medical History  Diagnosis Date  . Perimenopause   . Palpitations     negative cardiac w/u per Dr. Verl Blalock 2/09  . Anemia   . Depression     bipolar  . History of migraine headaches     headaches reported as migraines  . GERD (gastroesophageal reflux disease)   . Obesity   . Reflux esophagitis    Past Surgical History  Procedure Laterality Date  . Tubal ligation  1987  . Exploratory laparotomy  2000    x2 (Dr. Kendall Flack)  . Herpes simplex virus dfa     Family History  Problem Relation Age of Onset  . Hypertension Mother   . Diabetes Mother   . Heart disease Mother   . Thyroid disease Mother   . Breast cancer Maternal Aunt   . Pancreatic cancer Maternal Uncle    . Pancreatic cancer Other     family history  . Colon cancer Neg Hx        Review of Systems A comprehensive review of systems was negative.   Objective:      GENERAL: Well-developed, well-nourished female in no acute distress.  HEENT: Normocephalic, atraumatic. Sclerae anicteric.  NECK: Supple. Normal thyroid.  LUNGS: Clear to auscultation bilaterally.  HEART: Regular rate and rhythm. BREASTS: Symmetric in size. No palpable masses or lymphadenopathy, skin changes, or nipple drainage. ABDOMEN: Soft, nontender, nondistended. No organomegaly. Small umbilical hernia appreciated measuring approx 1 cm, no evidence of incarceration PELVIC: Normal external female genitalia. Vagina is pink and slightly atrophic.  Normal discharge. Normal appearing cervix. Uterus is normal in size. No adnexal mass or tenderness. EXTREMITIES: No cyanosis, clubbing, or edema, 2+ distal pulses.    Assessment:    Healthy female exam.      Plan:    pap smear collected Screening mammogram referral provided Patient encouraged to perform monthly self breast and vulva exam Patient will be contacted with any abnormal results Referral to gen surgery provided for hernia evaluation Patient with elevated BMI- discussed  weight loss regimen and patient patient is planning on joining a gym very soon See After Visit Summary for Counseling Recommendations

## 2014-10-01 LAB — CYTOLOGY - PAP

## 2014-10-05 ENCOUNTER — Other Ambulatory Visit: Payer: Self-pay

## 2014-10-05 ENCOUNTER — Other Ambulatory Visit: Payer: Self-pay | Admitting: Obstetrics and Gynecology

## 2014-10-05 ENCOUNTER — Ambulatory Visit (HOSPITAL_COMMUNITY)
Admission: RE | Admit: 2014-10-05 | Discharge: 2014-10-05 | Disposition: A | Discharge status: Home / Self Care | Payer: Medicare Other | Source: Ambulatory Visit | Attending: Obstetrics and Gynecology | Admitting: Obstetrics and Gynecology

## 2014-10-05 DIAGNOSIS — Z1231 Encounter for screening mammogram for malignant neoplasm of breast: Secondary | ICD-10-CM

## 2014-10-07 ENCOUNTER — Other Ambulatory Visit: Payer: Self-pay | Admitting: Obstetrics & Gynecology

## 2014-10-07 DIAGNOSIS — N644 Mastodynia: Secondary | ICD-10-CM

## 2014-10-11 DIAGNOSIS — H11423 Conjunctival edema, bilateral: Secondary | ICD-10-CM | POA: Diagnosis not present

## 2014-10-11 DIAGNOSIS — H18412 Arcus senilis, left eye: Secondary | ICD-10-CM | POA: Diagnosis not present

## 2014-10-11 DIAGNOSIS — H5340 Unspecified visual field defects: Secondary | ICD-10-CM | POA: Diagnosis not present

## 2014-10-11 DIAGNOSIS — H25013 Cortical age-related cataract, bilateral: Secondary | ICD-10-CM | POA: Diagnosis not present

## 2014-10-11 DIAGNOSIS — H40013 Open angle with borderline findings, low risk, bilateral: Secondary | ICD-10-CM | POA: Diagnosis not present

## 2014-10-11 DIAGNOSIS — H10413 Chronic giant papillary conjunctivitis, bilateral: Secondary | ICD-10-CM | POA: Diagnosis not present

## 2014-10-19 ENCOUNTER — Ambulatory Visit
Admission: RE | Admit: 2014-10-19 | Discharge: 2014-10-19 | Disposition: A | Discharge status: Home / Self Care | Payer: Medicare Other | Source: Ambulatory Visit | Attending: Obstetrics & Gynecology | Admitting: Obstetrics & Gynecology

## 2014-10-19 DIAGNOSIS — N644 Mastodynia: Secondary | ICD-10-CM

## 2014-11-10 DIAGNOSIS — F411 Generalized anxiety disorder: Secondary | ICD-10-CM | POA: Diagnosis not present

## 2014-11-11 DIAGNOSIS — F319 Bipolar disorder, unspecified: Secondary | ICD-10-CM | POA: Diagnosis not present

## 2014-11-11 DIAGNOSIS — Z8759 Personal history of other complications of pregnancy, childbirth and the puerperium: Secondary | ICD-10-CM | POA: Diagnosis not present

## 2014-11-11 DIAGNOSIS — K432 Incisional hernia without obstruction or gangrene: Secondary | ICD-10-CM | POA: Diagnosis not present

## 2014-11-15 DIAGNOSIS — F411 Generalized anxiety disorder: Secondary | ICD-10-CM | POA: Diagnosis not present

## 2014-11-16 DIAGNOSIS — F411 Generalized anxiety disorder: Secondary | ICD-10-CM | POA: Diagnosis not present

## 2014-11-17 DIAGNOSIS — F411 Generalized anxiety disorder: Secondary | ICD-10-CM | POA: Diagnosis not present

## 2014-11-18 DIAGNOSIS — F411 Generalized anxiety disorder: Secondary | ICD-10-CM | POA: Diagnosis not present

## 2014-11-20 DIAGNOSIS — F411 Generalized anxiety disorder: Secondary | ICD-10-CM | POA: Diagnosis not present

## 2014-11-22 DIAGNOSIS — F411 Generalized anxiety disorder: Secondary | ICD-10-CM | POA: Diagnosis not present

## 2014-11-23 DIAGNOSIS — F411 Generalized anxiety disorder: Secondary | ICD-10-CM | POA: Diagnosis not present

## 2014-11-25 DIAGNOSIS — F411 Generalized anxiety disorder: Secondary | ICD-10-CM | POA: Diagnosis not present

## 2014-11-26 DIAGNOSIS — F411 Generalized anxiety disorder: Secondary | ICD-10-CM | POA: Diagnosis not present

## 2014-11-29 DIAGNOSIS — F411 Generalized anxiety disorder: Secondary | ICD-10-CM | POA: Diagnosis not present

## 2014-12-01 DIAGNOSIS — F411 Generalized anxiety disorder: Secondary | ICD-10-CM | POA: Diagnosis not present

## 2014-12-03 DIAGNOSIS — F411 Generalized anxiety disorder: Secondary | ICD-10-CM | POA: Diagnosis not present

## 2014-12-04 DIAGNOSIS — F411 Generalized anxiety disorder: Secondary | ICD-10-CM | POA: Diagnosis not present

## 2014-12-06 DIAGNOSIS — F411 Generalized anxiety disorder: Secondary | ICD-10-CM | POA: Diagnosis not present

## 2014-12-07 DIAGNOSIS — F411 Generalized anxiety disorder: Secondary | ICD-10-CM | POA: Diagnosis not present

## 2014-12-09 DIAGNOSIS — F411 Generalized anxiety disorder: Secondary | ICD-10-CM | POA: Diagnosis not present

## 2014-12-11 DIAGNOSIS — F411 Generalized anxiety disorder: Secondary | ICD-10-CM | POA: Diagnosis not present

## 2014-12-13 DIAGNOSIS — N898 Other specified noninflammatory disorders of vagina: Secondary | ICD-10-CM | POA: Diagnosis not present

## 2014-12-13 DIAGNOSIS — R350 Frequency of micturition: Secondary | ICD-10-CM | POA: Diagnosis not present

## 2014-12-13 DIAGNOSIS — F411 Generalized anxiety disorder: Secondary | ICD-10-CM | POA: Diagnosis not present

## 2014-12-14 DIAGNOSIS — F411 Generalized anxiety disorder: Secondary | ICD-10-CM | POA: Diagnosis not present

## 2014-12-16 ENCOUNTER — Ambulatory Visit: Payer: Medicare Other | Admitting: Diagnostic Neuroimaging

## 2014-12-16 DIAGNOSIS — F411 Generalized anxiety disorder: Secondary | ICD-10-CM | POA: Diagnosis not present

## 2014-12-18 DIAGNOSIS — F411 Generalized anxiety disorder: Secondary | ICD-10-CM | POA: Diagnosis not present

## 2014-12-21 ENCOUNTER — Ambulatory Visit: Payer: Medicare Other | Admitting: Diagnostic Neuroimaging

## 2014-12-27 ENCOUNTER — Encounter: Payer: Self-pay | Admitting: Diagnostic Neuroimaging

## 2014-12-27 DIAGNOSIS — R21 Rash and other nonspecific skin eruption: Secondary | ICD-10-CM | POA: Diagnosis not present

## 2014-12-27 DIAGNOSIS — E663 Overweight: Secondary | ICD-10-CM | POA: Diagnosis not present

## 2014-12-27 DIAGNOSIS — Z0000 Encounter for general adult medical examination without abnormal findings: Secondary | ICD-10-CM | POA: Diagnosis not present

## 2015-01-25 DIAGNOSIS — R21 Rash and other nonspecific skin eruption: Secondary | ICD-10-CM | POA: Diagnosis not present

## 2015-01-25 DIAGNOSIS — Z7251 High risk heterosexual behavior: Secondary | ICD-10-CM | POA: Diagnosis not present

## 2015-01-25 DIAGNOSIS — R350 Frequency of micturition: Secondary | ICD-10-CM | POA: Diagnosis not present

## 2015-01-27 DIAGNOSIS — R3911 Hesitancy of micturition: Secondary | ICD-10-CM | POA: Diagnosis not present

## 2015-01-27 DIAGNOSIS — N390 Urinary tract infection, site not specified: Secondary | ICD-10-CM | POA: Diagnosis not present

## 2015-02-16 DIAGNOSIS — F319 Bipolar disorder, unspecified: Secondary | ICD-10-CM | POA: Diagnosis not present

## 2015-04-04 DIAGNOSIS — E785 Hyperlipidemia, unspecified: Secondary | ICD-10-CM | POA: Diagnosis not present

## 2015-04-04 DIAGNOSIS — Z131 Encounter for screening for diabetes mellitus: Secondary | ICD-10-CM | POA: Diagnosis not present

## 2015-04-04 DIAGNOSIS — E559 Vitamin D deficiency, unspecified: Secondary | ICD-10-CM | POA: Diagnosis not present

## 2015-04-11 DIAGNOSIS — H04123 Dry eye syndrome of bilateral lacrimal glands: Secondary | ICD-10-CM | POA: Diagnosis not present

## 2015-04-11 DIAGNOSIS — H16143 Punctate keratitis, bilateral: Secondary | ICD-10-CM | POA: Diagnosis not present

## 2015-04-11 DIAGNOSIS — H2513 Age-related nuclear cataract, bilateral: Secondary | ICD-10-CM | POA: Diagnosis not present

## 2015-04-11 DIAGNOSIS — H11153 Pinguecula, bilateral: Secondary | ICD-10-CM | POA: Diagnosis not present

## 2015-04-11 DIAGNOSIS — H40013 Open angle with borderline findings, low risk, bilateral: Secondary | ICD-10-CM | POA: Diagnosis not present

## 2015-04-11 DIAGNOSIS — H18413 Arcus senilis, bilateral: Secondary | ICD-10-CM | POA: Diagnosis not present

## 2015-04-24 ENCOUNTER — Encounter (HOSPITAL_COMMUNITY): Payer: Self-pay | Admitting: *Deleted

## 2015-04-24 ENCOUNTER — Emergency Department (INDEPENDENT_AMBULATORY_CARE_PROVIDER_SITE_OTHER)
Admission: EM | Admit: 2015-04-24 | Discharge: 2015-04-24 | Disposition: A | Discharge status: Other Acute Inpatient Hospital | Payer: Medicare Other | Source: Home / Self Care | Attending: Family Medicine | Admitting: Family Medicine

## 2015-04-24 ENCOUNTER — Emergency Department (HOSPITAL_COMMUNITY): Payer: Medicare Other

## 2015-04-24 ENCOUNTER — Emergency Department (HOSPITAL_COMMUNITY)
Admission: EM | Admit: 2015-04-24 | Discharge: 2015-04-24 | Disposition: A | Discharge status: Home / Self Care | Payer: Medicare Other | Attending: Emergency Medicine | Admitting: Emergency Medicine

## 2015-04-24 DIAGNOSIS — E669 Obesity, unspecified: Secondary | ICD-10-CM | POA: Insufficient documentation

## 2015-04-24 DIAGNOSIS — R079 Chest pain, unspecified: Secondary | ICD-10-CM | POA: Diagnosis not present

## 2015-04-24 DIAGNOSIS — Z8659 Personal history of other mental and behavioral disorders: Secondary | ICD-10-CM | POA: Diagnosis not present

## 2015-04-24 DIAGNOSIS — R05 Cough: Secondary | ICD-10-CM | POA: Insufficient documentation

## 2015-04-24 DIAGNOSIS — R0789 Other chest pain: Secondary | ICD-10-CM

## 2015-04-24 DIAGNOSIS — Z7982 Long term (current) use of aspirin: Secondary | ICD-10-CM | POA: Insufficient documentation

## 2015-04-24 DIAGNOSIS — Z862 Personal history of diseases of the blood and blood-forming organs and certain disorders involving the immune mechanism: Secondary | ICD-10-CM | POA: Insufficient documentation

## 2015-04-24 DIAGNOSIS — Z8679 Personal history of other diseases of the circulatory system: Secondary | ICD-10-CM | POA: Insufficient documentation

## 2015-04-24 DIAGNOSIS — Z8719 Personal history of other diseases of the digestive system: Secondary | ICD-10-CM | POA: Diagnosis not present

## 2015-04-24 DIAGNOSIS — R0602 Shortness of breath: Secondary | ICD-10-CM | POA: Diagnosis not present

## 2015-04-24 DIAGNOSIS — Z87891 Personal history of nicotine dependence: Secondary | ICD-10-CM | POA: Insufficient documentation

## 2015-04-24 LAB — CBC
HCT: 38.5 % (ref 36.0–46.0)
Hemoglobin: 12.8 g/dL (ref 12.0–15.0)
MCH: 30.3 pg (ref 26.0–34.0)
MCHC: 33.2 g/dL (ref 30.0–36.0)
MCV: 91.2 fL (ref 78.0–100.0)
PLATELETS: 219 10*3/uL (ref 150–400)
RBC: 4.22 MIL/uL (ref 3.87–5.11)
RDW: 13.4 % (ref 11.5–15.5)
WBC: 6.7 10*3/uL (ref 4.0–10.5)

## 2015-04-24 LAB — BASIC METABOLIC PANEL
Anion gap: 9 (ref 5–15)
BUN: 10 mg/dL (ref 6–20)
CALCIUM: 9.3 mg/dL (ref 8.9–10.3)
CO2: 23 mmol/L (ref 22–32)
Chloride: 108 mmol/L (ref 101–111)
Creatinine, Ser: 0.78 mg/dL (ref 0.44–1.00)
GFR calc Af Amer: >60 mL/min (ref >60)
GFR calc non Af Amer: >60 mL/min (ref >60)
GLUCOSE: 84 mg/dL (ref 65–99)
Potassium: 3.7 mmol/L (ref 3.5–5.1)
Sodium: 140 mmol/L (ref 135–145)

## 2015-04-24 LAB — TROPONIN I: Troponin I: <0.03 ng/mL (ref <0.031)

## 2015-04-24 NOTE — ED Notes (Signed)
Pt from [facility]  Has her ekg with her  Read as no stemi there

## 2015-04-24 NOTE — ED Provider Notes (Signed)
CSN: [id number]     Arrival date & time 04/24/15  1556 History   First MD Initiated Contact with Patient 04/24/15 1741     Chief Complaint  Patient presents with  . Chest Pain   Patient is a 54 y.o. female presenting with general illness. The history is provided by the patient. No language interpreter was used.  Illness Location:  Chest Quality:  Pain Severity:  Mild Onset quality:  Gradual Timing:  Intermittent Progression:  Waxing and waning Chronicity:  Recurrent Context:  PMHx of HLD (diet controlled) as well as tobacco/marijuana use presenting with cough and SOB. TOB use is noted to be 0.5 PPD for 30 years. Patient started using marijuana again over past 6 months during which period she has experienced shortness of breath. Patient notes onset of productive cough with yellow sputum yesterday evening. Patient denies fever. Patient also notes intermittent left-sided exertional CP associated with nausea (no emesis), mild SOB, and diaphoresis over past 6 months as well. Associated symptoms: chest pain (none currently or in recent past), cough and shortness of breath   Associated symptoms: no abdominal pain, no fever, no nausea and no wheezing     Past Medical History  Diagnosis Date  . Perimenopause   . Palpitations     negative cardiac w/u per Dr. Verl Blalock 2/09  . Anemia   . Depression     bipolar  . History of migraine headaches     headaches reported as migraines  . GERD (gastroesophageal reflux disease)   . Obesity   . Reflux esophagitis    Past Surgical History  Procedure Laterality Date  . Tubal ligation  1987  . Exploratory laparotomy  2000    x2 (Dr. Kendall Flack)  . Herpes simplex virus dfa     Family History  Problem Relation Age of Onset  . Hypertension Mother   . Diabetes Mother   . Heart disease Mother   . Thyroid disease Mother   . Breast cancer Maternal Aunt   . Pancreatic cancer Maternal Uncle   . Pancreatic cancer Other     family history  . Colon  cancer Neg Hx    Social History  Substance Use Topics  . Smoking status: Former Smoker -- 0.60 packs/day for 30 years    Types: Cigarettes    Quit date: 07/20/2011  . Smokeless tobacco: Never Used     Comment: Few  . Alcohol Use: Yes     Comment: Rare glass of wine   OB History    No data available      Review of Systems  Constitutional: Negative for fever.  Respiratory: Positive for cough and shortness of breath. Negative for wheezing.   Cardiovascular: Positive for chest pain (none currently or in recent past).  Gastrointestinal: Negative for nausea and abdominal pain.  All other systems reviewed and are negative.   Allergies  Other  Home Medications   Prior to Admission medications   Medication Sig Start Date End Date Taking? Authorizing Provider  aspirin EC 325 MG tablet Take 325 mg by mouth 2 (two) times daily as needed (pain).   Yes Historical Provider, MD   BP 111/59 mmHg  Pulse 71  Temp(Src) 97.2 F (36.2 C) (Oral)  Resp 17  Ht 5\' 5"  (1.651 m)  Wt 185 lb (83.915 kg)  BMI 30.79 kg/m2  SpO2 99%  LMP 02/17/2011   Physical Exam  Constitutional: She is oriented to person, place, and time. She appears well-developed and well-nourished. No  distress.  HENT:  Head: Normocephalic and atraumatic.  Eyes: Conjunctivae are normal. Pupils are equal, round, and reactive to light.  Neck: Normal range of motion. Neck supple.  Cardiovascular: Normal rate, regular rhythm and intact distal pulses.   Pulmonary/Chest: Effort normal and breath sounds normal. She exhibits tenderness.  Abdominal: Soft. Bowel sounds are normal. She exhibits no distension. There is no tenderness. There is no rebound.  Musculoskeletal: Normal range of motion.  Neurological: She is alert and oriented to person, place, and time.  Skin: Skin is warm and dry. She is not diaphoretic.    ED Course  Procedures   Labs Review Labs Reviewed  BASIC METABOLIC PANEL  TROPONIN I  CBC   Imaging  Review Dg Chest 2 View  04/24/2015   CLINICAL DATA:  Chest pain for 3 weeks.  Left-sided chest pain.  EXAM: CHEST  2 VIEW  COMPARISON:  03/27/2014  FINDINGS: The heart size and mediastinal contours are within normal limits. Both lungs are clear. The visualized skeletal structures are unremarkable.  IMPRESSION: No active cardiopulmonary disease.   Electronically Signed   By: Kathreen Devoid   On: 04/24/2015 17:10   I have personally reviewed and evaluated these images and lab results as part of my medical decision-making.   EKG Interpretation   Date/Time:  Sunday April 24 2015 16:06:23 EDT Ventricular Rate:  64 PR Interval:  142 QRS Duration: 70 QT Interval:  384 QTC Calculation: 396 R Axis:   56 Text Interpretation:  Normal sinus rhythm Normal ECG Confirmed by POLLINA   MD, CHRISTOPHER [phone]) on 04/24/2015 6:02:27 PM      MDM  Ms. Brennan is a 54 yo female with PMHx of HLD (diet controlled) as well as tobacco/marijuana use presenting with cough and SOB. TOB use is noted to be 0.5 PPD for 30 years. Patient started using marijuana again over past 6 months during which period she has experienced shortness of breath. Patient notes onset of productive cough with yellow sputum yesterday evening. Patient denies fever. Patient also notes intermittent left-sided exertional CP associated with nausea (no emesis), mild SOB, and diaphoresis over past 6 months as well. Never seen a cardiologist or undergone a stress test. Denies CP currently or in the past few days.  Exam above notable for middle-age female lying in stretcher in no acute distress. Afebrile. Not tachycardic. Normotensive. Not tachypneic. Breathing well on room air and maintaining saturations without supplemental oxygen. Tender to palpation of chest bilaterally. Abdomen benign.  EKG showing no ST elevation/depression or T-wave changes. Troponin negative. Chest x-ray showing no acute cardiopulmonary process. Given negative labs and imaging above  and the fact the patient is not currently having chest pain and has not experienced chest pain in the past several days, do not believe the troponin with add a further workup and believe that it is safe for patient to follow-up with her PCP to set up an outpatient treadmill stress test. Given the cough and increased pain with palpation on exam it may be muscular skeletal chest pain as well.  Patient discharged home in stable condition. Strict ED return precautions discussed. Patient and family member understand and agree with plan have no further questions or concerns at this time.  Patient care discussed with my attending, Dr. Malachy Moan  Final diagnoses:  Chest pain, unspecified chest pain type    Mayer Camel, MD 04/24/15 [place], MD 04/26/15 [phone]

## 2015-04-24 NOTE — Discharge Instructions (Signed)

## 2015-04-24 NOTE — ED Provider Notes (Signed)
Patient presented to the ER with cough, chest congestion. She reports that she has been smoking marijuana and her symptoms have started since she started smoking. She also has been experiencing some chest pain intermittently that at times can be exertional, but she is not experiencing that currently.  Face to face Exam: HEENT - PERRLA Lungs - CTAB Heart - RRR, no M/R/G Abd - S/NT/ND Neuro - alert, oriented x3  Plan: Patient with bronchitis type symptoms that will be treated as an outpatient. She also has had some chest pain and discomfort in her chest that is not present today. Initial workup unremarkable, defer to primary doctor for further workup including possible outpatient stress test. Return if symptoms worsen.  Orpah Greek, MD 04/24/15 [phone]

## 2015-04-24 NOTE — ED Notes (Signed)
Pt is here b/c her "lungs hurt".  She feels like there are "barnicles in my lungs".  Pain is sternal and L sided and increases when she lays down.  She feels it's r/t the pot she started smoking last year, eating fried food every day and her high cholesterol.

## 2015-04-24 NOTE — ED Notes (Signed)
Patient transported to X-ray 

## 2015-04-24 NOTE — ED Notes (Signed)
Pt  Has  Sensation  Of  Pressure  In  Her  Lungs     And  Chest         For  Several  Weeks      -      Pt    States  She   Smoked   Marijuana         Recently               She       Reports     As   Well   A  Small  Bump  On  The  Area  Behind  Her  l   Knee  And  A   Small  Bump     On      Back    Of  Neck

## 2015-04-24 NOTE — ED Provider Notes (Signed)
CSN: [id number]     Arrival date & time 04/24/15  1417 History   First MD Initiated Contact with Patient 04/24/15 1443     Chief Complaint  Patient presents with  . Chest Pain   (Consider location/radiation/quality/duration/timing/severity/associated sxs/prior Treatment) Patient is a 54 y.o. female presenting with chest pain. The history is provided by the patient.  Chest Pain Pain location:  L chest Pain quality: pressure   Pain radiates to:  Does not radiate Pain radiates to the back: no   Pain severity:  No pain Duration:  3 weeks Timing:  Intermittent Progression:  Waxing and waning Chronicity:  New Context: at rest   Worsened by:  Certain positions Associated symptoms: anxiety, cough and shortness of breath   Associated symptoms: no palpitations   Risk factors: smoking   Risk factors comment:  Smoking marijuana reg and eating lot of fried chicken.   Past Medical History  Diagnosis Date  . Perimenopause   . Palpitations     negative cardiac w/u per Dr. Verl Blalock 2/09  . Anemia   . Depression     bipolar  . History of migraine headaches     headaches reported as migraines  . GERD (gastroesophageal reflux disease)   . Obesity   . Reflux esophagitis    Past Surgical History  Procedure Laterality Date  . Tubal ligation  1987  . Exploratory laparotomy  2000    x2 (Dr. Kendall Flack)  . Herpes simplex virus dfa     Family History  Problem Relation Age of Onset  . Hypertension Mother   . Diabetes Mother   . Heart disease Mother   . Thyroid disease Mother   . Breast cancer Maternal Aunt   . Pancreatic cancer Maternal Uncle   . Pancreatic cancer Other     family history  . Colon cancer Neg Hx    Social History  Substance Use Topics  . Smoking status: Former Smoker -- 0.60 packs/day for 30 years    Types: Cigarettes    Quit date: 07/20/2011  . Smokeless tobacco: Never Used     Comment: Few  . Alcohol Use: Yes     Comment: Rare glass of wine   OB History    No data available     Review of Systems  Constitutional: Negative.   HENT: Negative.   Respiratory: Positive for cough and shortness of breath. Negative for chest tightness and wheezing.   Cardiovascular: Positive for chest pain. Negative for palpitations and leg swelling.  Gastrointestinal: Negative.        Denies gerd sx.  All other systems reviewed and are negative.   Allergies  Other  Home Medications   Prior to Admission medications   Medication Sig Start Date End Date Taking? Authorizing Provider  aspirin EC 325 MG tablet Take 325 mg by mouth 2 (two) times daily as needed (pain).    Historical Provider, MD   Meds Ordered and Administered this Visit  Medications - No data to display  BP 108/72 mmHg  Pulse 72  Temp(Src) 98.6 F (37 C) (Oral)  Resp 18  SpO2 100%  LMP 02/17/2011 No data found.   Physical Exam  Constitutional: She is oriented to person, place, and time. She appears well-developed and well-nourished. No distress.  HENT:  Mouth/Throat: Oropharynx is clear and moist.  Eyes: Pupils are equal, round, and reactive to light.  Neck: Normal range of motion. Neck supple.  Cardiovascular: Normal rate, regular rhythm, normal heart sounds and  intact distal pulses.   Pulmonary/Chest: Effort normal and breath sounds normal. She exhibits no tenderness.  Abdominal: Soft. Bowel sounds are normal. There is no tenderness.  Lymphadenopathy:    She has no cervical adenopathy.  Neurological: She is alert and oriented to person, place, and time.  Skin: Skin is warm and dry.  Nursing note and vitals reviewed.   ED Course  Procedures (including critical care time) ED ECG REPORT   Date: 04/24/2015  Rate: 66  Rhythm: normal sinus rhythm  QRS Axis: normal  Intervals: normal  ST/T Wave abnormalities: normal  Conduction Disutrbances:none  Narrative Interpretation:   Old EKG Reviewed: none available  I have personally reviewed the EKG tracing and agree with the  computerized printout as noted.  Labs Review Labs Reviewed - No data to display  Imaging Review Dg Chest 2 View  04/24/2015   CLINICAL DATA:  Chest pain for 3 weeks.  Left-sided chest pain.  EXAM: CHEST  2 VIEW  COMPARISON:  03/27/2014  FINDINGS: The heart size and mediastinal contours are within normal limits. Both lungs are clear. The visualized skeletal structures are unremarkable.  IMPRESSION: No active cardiopulmonary disease.   Electronically Signed   By: Kathreen Devoid   On: 04/24/2015 17:10     Visual Acuity Review  Right Eye Distance:   Left Eye Distance:   Bilateral Distance:    Right Eye Near:   Left Eye Near:    Bilateral Near:         MDM   1. Atypical chest pain     Sent to ER for eval of chest pain-pt concerned about heart etiol.    Billy Fischer, MD 04/24/15 2040

## 2015-06-02 DIAGNOSIS — L989 Disorder of the skin and subcutaneous tissue, unspecified: Secondary | ICD-10-CM | POA: Diagnosis not present

## 2015-06-24 DIAGNOSIS — M79672 Pain in left foot: Secondary | ICD-10-CM | POA: Diagnosis not present

## 2015-06-24 DIAGNOSIS — M79671 Pain in right foot: Secondary | ICD-10-CM | POA: Diagnosis not present

## 2015-07-30 ENCOUNTER — Emergency Department (HOSPITAL_COMMUNITY): Payer: Medicare Other

## 2015-07-30 ENCOUNTER — Emergency Department (HOSPITAL_COMMUNITY)
Admission: EM | Admit: 2015-07-30 | Discharge: 2015-07-30 | Disposition: A | Discharge status: Home / Self Care | Payer: Medicare Other | Attending: Emergency Medicine | Admitting: Emergency Medicine

## 2015-07-30 ENCOUNTER — Encounter (HOSPITAL_COMMUNITY): Payer: Self-pay | Admitting: Emergency Medicine

## 2015-07-30 DIAGNOSIS — Z87891 Personal history of nicotine dependence: Secondary | ICD-10-CM | POA: Insufficient documentation

## 2015-07-30 DIAGNOSIS — R05 Cough: Secondary | ICD-10-CM | POA: Diagnosis not present

## 2015-07-30 DIAGNOSIS — Z8742 Personal history of other diseases of the female genital tract: Secondary | ICD-10-CM | POA: Insufficient documentation

## 2015-07-30 DIAGNOSIS — Z8719 Personal history of other diseases of the digestive system: Secondary | ICD-10-CM | POA: Insufficient documentation

## 2015-07-30 DIAGNOSIS — Z7982 Long term (current) use of aspirin: Secondary | ICD-10-CM | POA: Diagnosis not present

## 2015-07-30 DIAGNOSIS — Z8659 Personal history of other mental and behavioral disorders: Secondary | ICD-10-CM | POA: Diagnosis not present

## 2015-07-30 DIAGNOSIS — E669 Obesity, unspecified: Secondary | ICD-10-CM | POA: Diagnosis not present

## 2015-07-30 DIAGNOSIS — Z862 Personal history of diseases of the blood and blood-forming organs and certain disorders involving the immune mechanism: Secondary | ICD-10-CM | POA: Insufficient documentation

## 2015-07-30 DIAGNOSIS — R079 Chest pain, unspecified: Secondary | ICD-10-CM | POA: Diagnosis not present

## 2015-07-30 DIAGNOSIS — Z8679 Personal history of other diseases of the circulatory system: Secondary | ICD-10-CM | POA: Insufficient documentation

## 2015-07-30 DIAGNOSIS — R0602 Shortness of breath: Secondary | ICD-10-CM | POA: Diagnosis not present

## 2015-07-30 DIAGNOSIS — J069 Acute upper respiratory infection, unspecified: Secondary | ICD-10-CM | POA: Insufficient documentation

## 2015-07-30 LAB — I-STAT TROPONIN, ED: TROPONIN I, POC: 0 ng/mL (ref 0.00–0.08)

## 2015-07-30 LAB — CBC
HCT: 35.3 % — ABNORMAL LOW (ref 36.0–46.0)
Hemoglobin: 11.7 g/dL — ABNORMAL LOW (ref 12.0–15.0)
MCH: 30.8 pg (ref 26.0–34.0)
MCHC: 33.1 g/dL (ref 30.0–36.0)
MCV: 92.9 fL (ref 78.0–100.0)
PLATELETS: 202 10*3/uL (ref 150–400)
RBC: 3.8 MIL/uL — ABNORMAL LOW (ref 3.87–5.11)
RDW: 13.7 % (ref 11.5–15.5)
WBC: 3.4 10*3/uL — ABNORMAL LOW (ref 4.0–10.5)

## 2015-07-30 LAB — BASIC METABOLIC PANEL
Anion gap: 8 (ref 5–15)
BUN: 13 mg/dL (ref 6–20)
CALCIUM: 9 mg/dL (ref 8.9–10.3)
CO2: 26 mmol/L (ref 22–32)
CREATININE: 0.7 mg/dL (ref 0.44–1.00)
Chloride: 105 mmol/L (ref 101–111)
GFR calc Af Amer: >60 mL/min (ref >60)
GFR calc non Af Amer: >60 mL/min (ref >60)
GLUCOSE: 116 mg/dL — AB (ref 65–99)
Potassium: 4 mmol/L (ref 3.5–5.1)
Sodium: 139 mmol/L (ref 135–145)

## 2015-07-30 NOTE — ED Notes (Signed)
Pt complaining of fever/chills, chest tightness, fatigue, and SOB x 1 week. Denies cough, N/V/D. States she's a smoker, and sometimes this flares up more in the cold.

## 2015-07-30 NOTE — ED Provider Notes (Signed)
CSN: [id number]     Arrival date & time 07/30/15  1130 History   First MD Initiated Contact with Patient 07/30/15 1202     Chief Complaint  Patient presents with  . Shortness of Breath  . Cough  . Chest Pain     Patient is a 54 y.o. female presenting with shortness of breath, cough, and chest pain. The history is provided by the patient. No language interpreter was used.  Shortness of Breath Associated symptoms: chest pain and cough   Cough Associated symptoms: chest pain and shortness of breath   Chest Pain Associated symptoms: cough and shortness of breath    Nicole [name] is a 54 y.o. female who presents to the Emergency Department complaining of  Shortness of breath and cough. Symptoms started this morning. She had subjective fevers a few days ago, now resolved. She reports shortness of breath and light cough after going outside in the cold. She denies any chest pain, hemoptysis, abdominal pain, vomiting, lower extremity swelling or pain. She does not typically smoke tobacco but did have a cigarette today. Symptoms are mild and intermittent.  Past Medical History  Diagnosis Date  . Perimenopause   . Palpitations     negative cardiac w/u per Dr. Verl Blalock 2/09  . Anemia   . Depression     bipolar  . History of migraine headaches     headaches reported as migraines  . GERD (gastroesophageal reflux disease)   . Obesity   . Reflux esophagitis    Past Surgical History  Procedure Laterality Date  . Tubal ligation  1987  . Exploratory laparotomy  2000    x2 (Dr. Kendall Flack)  . Herpes simplex virus dfa     Family History  Problem Relation Age of Onset  . Hypertension Mother   . Diabetes Mother   . Heart disease Mother   . Thyroid disease Mother   . Breast cancer Maternal Aunt   . Pancreatic cancer Maternal Uncle   . Pancreatic cancer Other     family history  . Colon cancer Neg Hx    Social History  Substance Use Topics  . Smoking status: Former Smoker -- 0.60  packs/day for 30 years    Types: Cigarettes    Quit date: 07/20/2011  . Smokeless tobacco: Never Used     Comment: Few  . Alcohol Use: Yes     Comment: Rare glass of wine   OB History    No data available     Review of Systems  Respiratory: Positive for cough and shortness of breath.   Cardiovascular: Positive for chest pain.  All other systems reviewed and are negative.     Allergies  Other  Home Medications   Prior to Admission medications   Medication Sig Start Date End Date Taking? Authorizing Provider  aspirin EC 325 MG tablet Take 325 mg by mouth 2 (two) times daily as needed (pain).    Historical Provider, MD   BP 113/81 mmHg  Pulse 86  Temp(Src) 97.4 F (36.3 C) (Oral)  Resp 18  SpO2 100%  LMP 02/17/2011 Physical Exam  Constitutional: She is oriented to person, place, and time. She appears well-developed and well-nourished.  HENT:  Head: Normocephalic and atraumatic.  Cardiovascular: Normal rate and regular rhythm.   No murmur heard. Pulmonary/Chest: Effort normal and breath sounds normal. No respiratory distress.  Abdominal: Soft. There is no tenderness. There is no rebound and no guarding.  Musculoskeletal: She exhibits no edema  or tenderness.  Neurological: She is alert and oriented to person, place, and time.  Skin: Skin is warm and dry.  Psychiatric: She has a normal mood and affect. Her behavior is normal.  Nursing note and vitals reviewed.   ED Course  Procedures (including critical care time) Labs Review Labs Reviewed  BASIC METABOLIC PANEL - Abnormal; Notable for the following:    Glucose, Bld 116 (*)    All other components within normal limits  CBC - Abnormal; Notable for the following:    WBC 3.4 (*)    RBC 3.80 (*)    Hemoglobin 11.7 (*)    HCT 35.3 (*)    All other components within normal limits  I-STAT TROPOININ, ED    Imaging Review Dg Chest 2 View  07/30/2015  CLINICAL DATA:  Cough, sore throat, laryngitis for 3 days  EXAM: CHEST  2 VIEW COMPARISON:  04/24/2015 FINDINGS: The heart size and mediastinal contours are within normal limits. Both lungs are clear. The visualized skeletal structures are unremarkable. IMPRESSION: No active cardiopulmonary disease. Electronically Signed   By: Lahoma Crocker M.D.   On: 07/30/2015 12:46   I have personally reviewed and evaluated these images and lab results as part of my medical decision-making.   EKG Interpretation   Date/Time:  Saturday July 30 2015 11:42:55 EST Ventricular Rate:  82 PR Interval:  139 QRS Duration: 82 QT Interval:  346 QTC Calculation: 404 R Axis:   54 Text Interpretation:  Sinus rhythm No significant change since last  tracing Confirmed by JACUBOWITZ  MD, SAM [phone]) on 07/30/2015 11:49:53 AM      MDM   Final diagnoses:  URI (upper respiratory infection)    Patient here for evaluation of shortness of breath and cough for the last few days. She has no respiratory distress on examination, no wheezes, crackles. Clinical picture is not consistent with ACS, PE, CHF, pneumonia. Discussed with patient home care for URI, outpatient follow-up, return precautions.  Nicole Reichert, MD 07/31/15 [phone]

## 2015-07-30 NOTE — Discharge Instructions (Signed)
Upper Respiratory Infection, Adult Most upper respiratory infections (URIs) are a viral infection of the air passages leading to the lungs. A URI affects the nose, throat, and upper air passages. The most common type of URI is nasopharyngitis and is typically referred to as "the common cold." URIs run their course and usually go away on their own. Most of the time, a URI does not require medical attention, but sometimes a bacterial infection in the upper airways can follow a viral infection. This is called a secondary infection. Sinus and middle ear infections are common types of secondary upper respiratory infections. Bacterial pneumonia can also complicate a URI. A URI can worsen asthma and chronic obstructive pulmonary disease (COPD). Sometimes, these complications can require emergency medical care and may be life threatening.  CAUSES Almost all URIs are caused by viruses. A virus is a type of germ and can spread from one person to another.  RISKS FACTORS You may be at risk for a URI if:   You smoke.   You have chronic heart or lung disease.  You have a weakened defense (immune) system.   You are very young or very old.   You have nasal allergies or asthma.  You work in crowded or poorly ventilated areas.  You work in health care facilities or schools. SIGNS AND SYMPTOMS  Symptoms typically develop 2-3 days after you come in contact with a cold virus. Most viral URIs last 7-10 days. However, viral URIs from the influenza virus (flu virus) can last 14-18 days and are typically more severe. Symptoms may include:   Runny or stuffy (congested) nose.   Sneezing.   Cough.   Sore throat.   Headache.   Fatigue.   Fever.   Loss of appetite.   Pain in your forehead, behind your eyes, and over your cheekbones (sinus pain).  Muscle aches.  DIAGNOSIS  Your health care provider may diagnose a URI by:  Physical exam.  Tests to check that your symptoms are not due to  another condition such as:  Strep throat.  Sinusitis.  Pneumonia.  Asthma. TREATMENT  A URI goes away on its own with time. It cannot be cured with medicines, but medicines may be prescribed or recommended to relieve symptoms. Medicines may help:  Reduce your fever.  Reduce your cough.  Relieve nasal congestion. HOME CARE INSTRUCTIONS   Take medicines only as directed by your health care provider.   Gargle warm saltwater or take cough drops to comfort your throat as directed by your health care provider.  Use a warm mist humidifier or inhale steam from a shower to increase air moisture. This may make it easier to breathe.  Drink enough fluid to keep your urine clear or pale yellow.   Eat soups and other clear broths and maintain good nutrition.   Rest as needed.   Return to work when your temperature has returned to normal or as your health care provider advises. You may need to stay home longer to avoid infecting others. You can also use a face mask and careful hand washing to prevent spread of the virus.  Increase the usage of your inhaler if you have asthma.   Do not use any tobacco products, including cigarettes, chewing tobacco, or electronic cigarettes. If you need help quitting, ask your health care provider. PREVENTION  The best way to protect yourself from getting a cold is to practice good hygiene.   Avoid oral or hand contact with people with cold   symptoms.   Wash your hands often if contact occurs.  There is no clear evidence that vitamin C, vitamin E, echinacea, or exercise reduces the chance of developing a cold. However, it is always recommended to get plenty of rest, exercise, and practice good nutrition.  SEEK MEDICAL CARE IF:   You are getting worse rather than better.   Your symptoms are not controlled by medicine.   You have chills.  You have worsening shortness of breath.  You have brown or red mucus.  You have yellow or brown nasal  discharge.  You have pain in your face, especially when you bend forward.  You have a fever.  You have swollen neck glands.  You have pain while swallowing.  You have white areas in the back of your throat. SEEK IMMEDIATE MEDICAL CARE IF:   You have severe or persistent:  Headache.  Ear pain.  Sinus pain.  Chest pain.  You have chronic lung disease and any of the following:  Wheezing.  Prolonged cough.  Coughing up blood.  A change in your usual mucus.  You have a stiff neck.  You have changes in your:  Vision.  Hearing.  Thinking.  Mood. MAKE SURE YOU:   Understand these instructions.  Will watch your condition.  Will get help right away if you are not doing well or get worse.   This information is not intended to replace advice given to you by your health care provider. Make sure you discuss any questions you have with your health care provider.   Document Released: 01/30/2001 Document Revised: 12/21/2014 Document Reviewed: 11/11/2013 Elsevier Interactive Patient Education 2016 Elsevier Inc.  

## 2015-07-30 NOTE — ED Notes (Signed)
She is comfortable and in no distress.  We bring her lunch (regular tray) at this time per her request as ok'd by Dr. Ralene Bathe.

## 2015-08-05 ENCOUNTER — Ambulatory Visit: Payer: Medicare Other | Admitting: Cardiology

## 2015-08-06 ENCOUNTER — Encounter (HOSPITAL_COMMUNITY): Payer: Self-pay | Admitting: Emergency Medicine

## 2015-08-06 ENCOUNTER — Emergency Department (HOSPITAL_COMMUNITY)
Admission: EM | Admit: 2015-08-06 | Discharge: 2015-08-06 | Disposition: A | Discharge status: Home / Self Care | Payer: Medicare Other | Attending: Emergency Medicine | Admitting: Emergency Medicine

## 2015-08-06 ENCOUNTER — Emergency Department (HOSPITAL_COMMUNITY): Payer: Medicare Other

## 2015-08-06 DIAGNOSIS — Z862 Personal history of diseases of the blood and blood-forming organs and certain disorders involving the immune mechanism: Secondary | ICD-10-CM | POA: Insufficient documentation

## 2015-08-06 DIAGNOSIS — Z8742 Personal history of other diseases of the female genital tract: Secondary | ICD-10-CM | POA: Insufficient documentation

## 2015-08-06 DIAGNOSIS — E669 Obesity, unspecified: Secondary | ICD-10-CM | POA: Insufficient documentation

## 2015-08-06 DIAGNOSIS — Z8679 Personal history of other diseases of the circulatory system: Secondary | ICD-10-CM | POA: Insufficient documentation

## 2015-08-06 DIAGNOSIS — Z87891 Personal history of nicotine dependence: Secondary | ICD-10-CM | POA: Diagnosis not present

## 2015-08-06 DIAGNOSIS — R079 Chest pain, unspecified: Secondary | ICD-10-CM | POA: Diagnosis not present

## 2015-08-06 DIAGNOSIS — Z8719 Personal history of other diseases of the digestive system: Secondary | ICD-10-CM | POA: Insufficient documentation

## 2015-08-06 DIAGNOSIS — R0789 Other chest pain: Secondary | ICD-10-CM | POA: Diagnosis not present

## 2015-08-06 DIAGNOSIS — Z8659 Personal history of other mental and behavioral disorders: Secondary | ICD-10-CM | POA: Insufficient documentation

## 2015-08-06 LAB — CBC
HCT: 36.2 % (ref 36.0–46.0)
HEMOGLOBIN: 11.7 g/dL — AB (ref 12.0–15.0)
MCH: 29.9 pg (ref 26.0–34.0)
MCHC: 32.3 g/dL (ref 30.0–36.0)
MCV: 92.6 fL (ref 78.0–100.0)
Platelets: 243 10*3/uL (ref 150–400)
RBC: 3.91 MIL/uL (ref 3.87–5.11)
RDW: 13.3 % (ref 11.5–15.5)
WBC: 4.9 10*3/uL (ref 4.0–10.5)

## 2015-08-06 LAB — BASIC METABOLIC PANEL
ANION GAP: 6 (ref 5–15)
BUN: 15 mg/dL (ref 6–20)
CALCIUM: 9.7 mg/dL (ref 8.9–10.3)
CO2: 27 mmol/L (ref 22–32)
Chloride: 106 mmol/L (ref 101–111)
Creatinine, Ser: 0.92 mg/dL (ref 0.44–1.00)
GFR calc Af Amer: >60 mL/min (ref >60)
GLUCOSE: 102 mg/dL — AB (ref 65–99)
POTASSIUM: 4 mmol/L (ref 3.5–5.1)
SODIUM: 139 mmol/L (ref 135–145)

## 2015-08-06 LAB — I-STAT TROPONIN, ED: TROPONIN I, POC: 0 ng/mL (ref 0.00–0.08)

## 2015-08-06 NOTE — ED Provider Notes (Signed)
CSN: [id number]     Arrival date & time 08/06/15  1417 History   First MD Initiated Contact with Patient 08/06/15 1700     Chief Complaint  Patient presents with  . Chest Pain   Present for chest pain, present for 2 weeks, worse the past 2 days. Coming every few minutes and lasting several seconds. Not exertional. Located in the left chest last breast. Worse with pressing on the area. Patient is a social smoker, office next week. Feels like the lead is making it worse. No shortness of breath or diaphoresis.   Patient is a 54 y.o. female presenting with chest pain.  Chest Pain Pain location:  L chest Pain quality: aching and pressure   Pain quality comment:  Squeezing and cramping Pain radiates to:  Does not radiate Pain radiates to the back: no   Pain severity:  Moderate Timing:  Intermittent (lasts seconds) Context: movement and at rest   Context: not breathing   Associated symptoms: no abdominal pain, no dizziness, no fever, no headache, no nausea, no palpitations, no shortness of breath and not vomiting     Past Medical History  Diagnosis Date  . Perimenopause   . Palpitations     negative cardiac w/u per Dr. Verl Blalock 2/09  . Anemia   . Depression     bipolar  . History of migraine headaches     headaches reported as migraines  . GERD (gastroesophageal reflux disease)   . Obesity   . Reflux esophagitis    Past Surgical History  Procedure Laterality Date  . Tubal ligation  1987  . Exploratory laparotomy  2000    x2 (Dr. Kendall Flack)  . Herpes simplex virus dfa     Family History  Problem Relation Age of Onset  . Hypertension Mother   . Diabetes Mother   . Heart disease Mother   . Thyroid disease Mother   . Breast cancer Maternal Aunt   . Pancreatic cancer Maternal Uncle   . Pancreatic cancer Other     family history  . Colon cancer Neg Hx    Social History  Substance Use Topics  . Smoking status: Former Smoker -- 0.60 packs/day for 30 years    Types:  Cigarettes    Quit date: 07/20/2011  . Smokeless tobacco: Never Used     Comment: Few  . Alcohol Use: Yes     Comment: Rare glass of wine   OB History    No data available     Review of Systems  Constitutional: Negative for fever and chills.  Respiratory: Negative for shortness of breath.   Cardiovascular: Positive for chest pain (left sided, squeezing, cramping). Negative for palpitations and leg swelling.  Gastrointestinal: Negative for nausea, vomiting, abdominal pain, diarrhea, constipation and abdominal distention.  Genitourinary: Negative for dysuria, frequency, flank pain and decreased urine volume.  Neurological: Negative for dizziness, speech difficulty, light-headedness and headaches.  All other systems reviewed and are negative.     Allergies  Other  Home Medications   Prior to Admission medications   Medication Sig Start Date End Date Taking? Authorizing Provider  Cholecalciferol (VITAMIN D) 2000 UNITS tablet Take 2,000 Units by mouth daily.    Historical Provider, MD  Pseudoephedrine-Ibuprofen (ADVIL COLD & SINUS LIQUI-GELS) 30-200 MG CAPS Take 1 capsule by mouth once.    Historical Provider, MD   BP 113/79 mmHg  Pulse 80  Temp(Src) 97.6 F (36.4 C) (Oral)  Resp 18  Ht 5' 5.5" (1.664  m)  Wt 82.872 kg  BMI 29.93 kg/m2  SpO2 98%  LMP 02/17/2011 Physical Exam  Constitutional: She appears well-developed and well-nourished. No distress.  HENT:  Head: Normocephalic and atraumatic.  Eyes: Pupils are equal, round, and reactive to light.  Neck: Normal range of motion.  Cardiovascular: Normal rate, regular rhythm, normal heart sounds and intact distal pulses.  Exam reveals no gallop and no friction rub.   No murmur heard. Pulmonary/Chest: Effort normal and breath sounds normal. No respiratory distress. She has no wheezes. She has no rales. She exhibits tenderness (left chest wall/breast).  No erythema, swelling, induration.  Abdominal: Soft. Bowel sounds are  normal. She exhibits no distension and no mass. There is no tenderness. There is no rebound and no guarding.  Lymphadenopathy:    She has no cervical adenopathy.  Skin: Skin is warm and dry. She is not diaphoretic.  Nursing note and vitals reviewed.   ED Course  Procedures (including critical care time) Labs Review Labs Reviewed  BASIC METABOLIC PANEL - Abnormal; Notable for the following:    Glucose, Bld 102 (*)    All other components within normal limits  CBC - Abnormal; Notable for the following:    Hemoglobin 11.7 (*)    All other components within normal limits  I-STAT TROPOININ, ED    Imaging Review Dg Chest 2 View  08/06/2015  CLINICAL DATA:  Left upper chest pain for 2 months. EXAM: CHEST  2 VIEW COMPARISON:  07/30/2015 FINDINGS: Normal heart size. Lungs are hyperaerated and clear. No pneumothorax or pleural effusion. IMPRESSION: No active cardiopulmonary disease. Electronically Signed   By: Marybelle Killings M.D.   On: 08/06/2015 15:07   I have personally reviewed and evaluated these images and lab results as part of my medical decision-making.   EKG Interpretation   Date/Time:  Saturday August 06 2015 14:25:00 EST Ventricular Rate:  81 PR Interval:  136 QRS Duration: 76 QT Interval:  348 QTC Calculation: 404 R Axis:   65 Text Interpretation:  Normal sinus rhythm Confirmed by ZAVITZ  MD, JOSHUA  ([id number]) on 08/06/2015 5:34:49 PM      MDM   Final diagnoses:  Left sided chest pain     54 year old African-American female who presents today with left-sided chest pain present for several weeks, worse the past couple of days. Squeezing sensation, nonradiating. Worse with palpation. Also worse when smoking weed. Not accompanied by shortness of breath, diaphoresis, leg swelling. Not exertional. He had a URI about a week ago which has resolved. Denies fevers chills cough sick contacts recent travel.  On exam, NAD, AFVSS. Tenderness to palpation over the left chest last  breast. No sign of abscess or infection. Consistent with MSK pain. Considered but not consistent with ACS or PE or pneumothorax or pneumonia. EKG wnl. Chest x-ray benign. Recommend symptomatic care and follow-up to PCP. Discharge.  Pt was seen under the supervision of Dr. Reather Converse.    Sherian Maroon, MD 08/06/15 [place], MD 08/07/15 [phone]

## 2015-08-06 NOTE — ED Notes (Signed)
Pt sts intermittent left sided CP x 3 days that is worse with inspiration and cough; pt sts is tightness

## 2015-08-06 NOTE — Discharge Instructions (Signed)
Chest Wall Pain °Chest wall pain is pain in or around the bones and muscles of your chest. Sometimes, an injury causes this pain. Sometimes, the cause may not be known. This pain may take several weeks or longer to get better. °HOME CARE °Pay attention to any changes in your symptoms. Take these actions to help with your pain: °· Rest as told by your doctor. °· Avoid activities that cause pain. Try not to use your chest, belly (abdominal), or side muscles to lift heavy things. °· If directed, apply ice to the painful area: °¨ Put ice in a plastic bag. °¨ Place a towel between your skin and the bag. °¨ Leave the ice on for 20 minutes, 2-3 times per day. °· Take over-the-counter and prescription medicines only as told by your doctor. °· Do not use tobacco products, including cigarettes, chewing tobacco, and e-cigarettes. If you need help quitting, ask your doctor. °· Keep all follow-up visits as told by your doctor. This is important. °GET HELP IF: °· You have a fever. °· Your chest pain gets worse. °· You have new symptoms. °GET HELP RIGHT AWAY IF: °· You feel sick to your stomach (nauseous) or you throw up (vomit). °· You feel sweaty or light-headed. °· You have a cough with phlegm (sputum) or you cough up blood. °· You are short of breath. °  °This information is not intended to replace advice given to you by your health care provider. Make sure you discuss any questions you have with your health care provider. °  °Document Released: 01/23/2008 Document Revised: 04/27/2015 Document Reviewed: 11/01/2014 °Elsevier Interactive Patient Education ©2016 Elsevier Inc. ° °

## 2015-09-12 ENCOUNTER — Telehealth: Payer: Self-pay | Admitting: Cardiology

## 2015-09-12 NOTE — Telephone Encounter (Signed)
Received records from [facility] for appointment on 09/13/15 with Dr Percival Spanish.  Records given to [facility] (medical records) for Dr Hochrein's schedule on 09/13/15. lp

## 2015-09-13 ENCOUNTER — Encounter: Payer: Self-pay | Admitting: Cardiology

## 2015-09-13 ENCOUNTER — Ambulatory Visit (INDEPENDENT_AMBULATORY_CARE_PROVIDER_SITE_OTHER): Payer: Medicare HMO | Admitting: Cardiology

## 2015-09-13 VITALS — BP 114/80 | HR 84 | Ht 65.5 in | Wt 183.2 lb

## 2015-09-13 DIAGNOSIS — R0782 Intercostal pain: Secondary | ICD-10-CM | POA: Diagnosis not present

## 2015-09-13 DIAGNOSIS — R079 Chest pain, unspecified: Secondary | ICD-10-CM | POA: Diagnosis not present

## 2015-09-13 NOTE — Progress Notes (Signed)
Cardiology Office Note   Date:  09/13/2015   ID:  Nicole [name], DOB Dec 25, 1960, MRN [id number]  PCP:  Aldona Bar, MD  Cardiologist:   Minus Breeding, MD   No chief complaint on file.     History of Present Illness: Nicole [name] is a 55 y.o. female who presents for evaluation of chest discomfort. She was seen by our practice in 2014 apparently for chest discomfort as well. She's been seen in the past and I did see an echocardiogram 2009 which was essentially normal. She's been getting chest discomfort for some time. However, it is a left-sided focal discomfort it is reproducible with palpation. She was actually in the emergency room in December and I reviewed these records. She said constant discomfort. It is not waxing or waning. It is not brought on by activity. She doesn't describe associated symptoms. She has no nausea vomiting or diaphoresis. She doesn't have any palpitations, presyncope or syncope. She has no PND or orthopnea.  Of note her mother did have a heart attack at age 70 by her report but is alive and apparently hasn't had other heart procedures.  Past Medical History  Diagnosis Date  . Perimenopause   . Palpitations     negative cardiac w/u per Dr. Verl Blalock 2/09  . Anemia   . Depression     bipolar  . History of migraine headaches     headaches reported as migraines  . GERD (gastroesophageal reflux disease)   . Obesity   . Reflux esophagitis   . Sleep apnea     No CPAP    Past Surgical History  Procedure Laterality Date  . Tubal ligation  1987  . Exploratory laparotomy  2000    x2 (Dr. Kendall Flack)  . Herpes simplex virus dfa       Current Outpatient Prescriptions  Medication Sig Dispense Refill  . Cholecalciferol (VITAMIN D) 2000 UNITS tablet Take 2,000 Units by mouth daily.     No current facility-administered medications for this visit.    Allergies:   Other    ROS:  Please see the history of present illness.   Otherwise, review of systems  are positive for none.   All other systems are reviewed and negative.    PHYSICAL EXAM: VS:  BP 114/80 mmHg  Pulse 84  Ht 5' 5.5" (1.664 m)  Wt 183 lb 3.2 oz (83.099 kg)  BMI 30.01 kg/m2  LMP 02/17/2011 , BMI Body mass index is 30.01 kg/(m^2). GENERAL:  Well appearing HEENT:  Pupils equal round and reactive, fundi not visualized, oral mucosa unremarkable NECK:  No jugular venous distention, waveform within normal limits, carotid upstroke brisk and symmetric, no bruits, no thyromegaly LYMPHATICS:  No cervical, inguinal adenopathy LUNGS:  Clear to auscultation bilaterally BACK:  No CVA tenderness CHEST:  Unremarkable, tender to touch HEART:  PMI not displaced or sustained,S1 and S2 within normal limits, no S3, no S4, no clicks, no rubs, no murmurs ABD:  Flat, positive bowel sounds normal in frequency in pitch, no bruits, no rebound, no guarding, no midline pulsatile mass, no hepatomegaly, no splenomegaly EXT:  2 plus pulses throughout, no edema, no cyanosis no clubbing SKIN:  No rashes no nodules NEURO:  Cranial nerves II through XII grossly intact, motor grossly intact throughout PSYCH:  Cognitively intact, oriented to person place and time    EKG:  EKG is ordered today. The ekg ordered today demonstrates sinus rhythm, rate 84, axis within normal limits, intervals  within normal limits, nonspecific inferior T-wave inversions unchanged from previous EKG in 12/16 but not the most recent EKG   Recent Labs: 08/06/2015: BUN 15; Creatinine, Ser 0.92; Hemoglobin 11.7*; Platelets 243; Potassium 4.0; Sodium 139    Lipid Panel    Component Value Date/Time   CHOL 200 01/04/2010 2112   TRIG 66 01/04/2010 2112   HDL 64 01/04/2010 2112   CHOLHDL 3.1 Ratio 01/04/2010 2112   VLDL 13 01/04/2010 2112   LDLCALC 123* 01/04/2010 2112      Wt Readings from Last 3 Encounters:  09/13/15 183 lb 3.2 oz (83.099 kg)  08/06/15 182 lb 11.2 oz (82.872 kg)  04/24/15 185 lb (83.915 kg)      Other  studies Reviewed: Additional studies/ records that were reviewed today include: ED records and previous echo. Review of the above records demonstrates:  Please see elsewhere in the note.     ASSESSMENT AND PLAN:  CHEST PAIN:  Atypical but with a significant family history she needs to be screened with stress testing. I will bring the patient back for a POET (Plain Old Exercise Test). This will allow me to screen for obstructive coronary disease, risk stratify and very importantly provide a prescription for exercise.  OVERWEIGHT:  She is working on weight loss and has gone up and down with her weights.    Current medicines are reviewed at length with the patient today.  The patient does not have concerns regarding medicines.  The following changes have been made:  no change  Labs/ tests ordered today include:   Orders Placed This Encounter  Procedures  . Exercise Tolerance Test  . EKG 12-Lead     Disposition:   FU with me as needed.      Signed, Minus Breeding, MD  09/13/2015 9:01 AM    [place]

## 2015-09-13 NOTE — Patient Instructions (Signed)
Your physician has requested that you have an exercise tolerance test. For further information please visit [email or web address]. Please also follow instruction sheet, as given.  Dr Hochrein recommends that you follow-up with him as needed. 

## 2015-09-15 ENCOUNTER — Telehealth (HOSPITAL_COMMUNITY): Payer: Self-pay

## 2015-09-15 NOTE — Telephone Encounter (Signed)
Encounter complete. 

## 2015-09-16 ENCOUNTER — Ambulatory Visit (HOSPITAL_COMMUNITY)
Admission: RE | Admit: 2015-09-16 | Discharge: 2015-09-16 | Disposition: A | Discharge status: Home / Self Care | Payer: Medicare HMO | Source: Ambulatory Visit | Attending: Urology | Admitting: Urology

## 2015-09-16 DIAGNOSIS — R079 Chest pain, unspecified: Secondary | ICD-10-CM

## 2015-09-16 LAB — EXERCISE TOLERANCE TEST
CHL CUP MPHR: 164 {beats}/min
CHL CUP RESTING HR STRESS: 77 {beats}/min
CHL RATE OF PERCEIVED EXERTION: 16
CSEPHR: 100 %
Estimated workload: 10.1 METS
Exercise duration (min): 8 min
Peak HR: 164 {beats}/min

## 2015-10-18 ENCOUNTER — Emergency Department (HOSPITAL_COMMUNITY): Payer: Medicare HMO

## 2015-10-18 ENCOUNTER — Emergency Department (HOSPITAL_COMMUNITY)
Admission: EM | Admit: 2015-10-18 | Discharge: 2015-10-18 | Disposition: A | Discharge status: Home / Self Care | Payer: Medicare HMO | Attending: Emergency Medicine | Admitting: Emergency Medicine

## 2015-10-18 DIAGNOSIS — R509 Fever, unspecified: Secondary | ICD-10-CM | POA: Diagnosis not present

## 2015-10-18 DIAGNOSIS — M542 Cervicalgia: Secondary | ICD-10-CM | POA: Diagnosis not present

## 2015-10-18 DIAGNOSIS — Z8719 Personal history of other diseases of the digestive system: Secondary | ICD-10-CM | POA: Diagnosis not present

## 2015-10-18 DIAGNOSIS — B349 Viral infection, unspecified: Secondary | ICD-10-CM

## 2015-10-18 DIAGNOSIS — Z862 Personal history of diseases of the blood and blood-forming organs and certain disorders involving the immune mechanism: Secondary | ICD-10-CM | POA: Insufficient documentation

## 2015-10-18 DIAGNOSIS — F17210 Nicotine dependence, cigarettes, uncomplicated: Secondary | ICD-10-CM | POA: Diagnosis not present

## 2015-10-18 DIAGNOSIS — R05 Cough: Secondary | ICD-10-CM | POA: Diagnosis present

## 2015-10-18 DIAGNOSIS — Z8669 Personal history of other diseases of the nervous system and sense organs: Secondary | ICD-10-CM | POA: Insufficient documentation

## 2015-10-18 DIAGNOSIS — E669 Obesity, unspecified: Secondary | ICD-10-CM | POA: Insufficient documentation

## 2015-10-18 DIAGNOSIS — Z8742 Personal history of other diseases of the female genital tract: Secondary | ICD-10-CM | POA: Insufficient documentation

## 2015-10-18 DIAGNOSIS — H5710 Ocular pain, unspecified eye: Secondary | ICD-10-CM | POA: Insufficient documentation

## 2015-10-18 DIAGNOSIS — Z8659 Personal history of other mental and behavioral disorders: Secondary | ICD-10-CM | POA: Diagnosis not present

## 2015-10-18 DIAGNOSIS — G43909 Migraine, unspecified, not intractable, without status migrainosus: Secondary | ICD-10-CM | POA: Diagnosis not present

## 2015-10-18 MED ORDER — ACETAMINOPHEN 500 MG PO TABS: 1000.0000 mg | ORAL_TABLET | Freq: Three times a day (TID) | ORAL | Status: DC | PRN

## 2015-10-18 MED ORDER — FLUTICASONE PROPIONATE 50 MCG/ACT NA SUSP
2.0000 | Freq: Every day | NASAL | Status: DC
  Administered 2015-10-18: 2 via NASAL
  Filled 2015-10-18: qty 16

## 2015-10-18 MED ORDER — IPRATROPIUM-ALBUTEROL 0.5-2.5 (3) MG/3ML IN SOLN
3.0000 mL | Freq: Once | RESPIRATORY_TRACT | Status: AC
  Administered 2015-10-18: 3 mL via RESPIRATORY_TRACT
  Filled 2015-10-18: qty 3

## 2015-10-18 MED ORDER — IBUPROFEN 600 MG PO TABS: 600.0000 mg | ORAL_TABLET | Freq: Four times a day (QID) | ORAL | Status: DC | PRN

## 2015-10-18 MED ORDER — BENZONATATE 100 MG PO CAPS
100.0000 mg | ORAL_CAPSULE | Freq: Once | ORAL | Status: AC
  Administered 2015-10-18: 100 mg via ORAL
  Filled 2015-10-18: qty 1

## 2015-10-18 MED ORDER — PREDNISONE 20 MG PO TABS
60.0000 mg | ORAL_TABLET | Freq: Once | ORAL | Status: AC
  Administered 2015-10-18: 60 mg via ORAL
  Filled 2015-10-18: qty 3

## 2015-10-18 MED ORDER — ALBUTEROL SULFATE HFA 108 (90 BASE) MCG/ACT IN AERS
2.0000 | INHALATION_SPRAY | Freq: Once | RESPIRATORY_TRACT | Status: AC
  Administered 2015-10-18: 2 via RESPIRATORY_TRACT
  Filled 2015-10-18: qty 6.7

## 2015-10-18 MED ORDER — BENZONATATE 100 MG PO CAPS: 100.0000 mg | ORAL_CAPSULE | Freq: Three times a day (TID) | ORAL | Status: DC

## 2015-10-18 MED ORDER — ACETAMINOPHEN 325 MG PO TABS
650.0000 mg | ORAL_TABLET | Freq: Once | ORAL | Status: AC
  Administered 2015-10-18: 650 mg via ORAL
  Filled 2015-10-18: qty 2

## 2015-10-18 MED ORDER — PREDNISONE 20 MG PO TABS: 40.0000 mg | ORAL_TABLET | Freq: Every day | ORAL | Status: DC

## 2015-10-18 NOTE — ED Provider Notes (Signed)
CSN: [id number]     Arrival date & time 10/18/15  0231 History  By signing my name below, I, Eustaquio Maize, attest that this documentation has been prepared under the direction and in the presence of [employer], PA-C.  Electronically Signed: Eustaquio Maize, ED Scribe. 10/18/2015. 3:26 AM.   Chief Complaint  Patient presents with  . Flu-like symptoms    The history is provided by the patient. No language interpreter was used.     HPI Comments: Nicole [name] is a 55 y.o. female who presents to the Emergency Department complaining of gradual onset, constant, productive cough with yellow sputum x 3 days. Pt also complains of stabbing chest pain with coughing, bilateral eye pain, headache, neck pain, nasal congestion, rhinorrhea, subjective fever, and myalgias. Pt's temperature in the ED is 100 F. 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 She has been drinking hot tea and taking Advil Cold & Sinus and Ibuprofen without relief. Denies sore throat or any other associated symptoms.   Past Medical History  Diagnosis Date  . Perimenopause   . Palpitations     negative cardiac w/u per Dr. Verl Blalock 2/09  . Anemia   . Depression     bipolar  . History of migraine headaches     headaches reported as migraines  . GERD (gastroesophageal reflux disease)   . Obesity   . Reflux esophagitis   . Sleep apnea     No CPAP   Past Surgical History  Procedure Laterality Date  . Tubal ligation  1987  . Exploratory laparotomy  2000    x2 (Dr. Kendall Flack)  . Herpes simplex virus dfa    . Ectopic pregnancy surgery     Family History  Problem Relation Age of Onset  . Hypertension Mother   . Diabetes Mother   . Heart disease Mother   . Thyroid disease Mother   . Heart attack Mother   . Glaucoma Mother   . Breast cancer Maternal Aunt   . Pancreatic cancer Maternal Uncle   . Pancreatic cancer Other     family history  . Colon cancer Neg Hx   .  Diabetes Sister   . Heart attack Maternal Grandmother   . Heart attack Maternal Grandfather    Social History  Substance Use Topics  . Smoking status: Current Some Day Smoker -- 30 years    Types: Cigarettes    Last Attempt to Quit: 07/20/2011  . Smokeless tobacco: Never Used     Comment: Few  . Alcohol Use: 0.0 oz/week    0 Standard drinks or equivalent per week     Comment: Rare glass of wine   OB History    No data available      Review of Systems  Constitutional: Positive for fever (subjective).  HENT: Positive for congestion and rhinorrhea. Negative for sore throat.   Eyes: Positive for pain.  Respiratory: Positive for cough.   Musculoskeletal: Positive for myalgias and neck pain.  Neurological: Positive for headaches.  All other systems reviewed and are negative.   Allergies  Other  Home Medications   Prior to Admission medications   Medication Sig Start Date End Date Taking? Authorizing Provider  acetaminophen (TYLENOL) 500 MG tablet Take 2 tablets (1,000 mg total) by mouth every 8 (eight) hours as needed for mild pain, moderate pain or fever. 10/18/15   Antonietta Breach, PA-C  benzonatate (TESSALON) 100 MG capsule  Take 1 capsule (100 mg total) by mouth every 8 (eight) hours. 10/18/15   Antonietta Breach, PA-C  Cholecalciferol (VITAMIN D) 2000 UNITS tablet Take 2,000 Units by mouth daily.    Historical Provider, MD  ibuprofen (ADVIL,MOTRIN) 600 MG tablet Take 1 tablet (600 mg total) by mouth every 6 (six) hours as needed for fever, mild pain or moderate pain. 10/18/15   Antonietta Breach, PA-C  predniSONE (DELTASONE) 20 MG tablet Take 2 tablets (40 mg total) by mouth daily. 10/18/15   Antonietta Breach, PA-C   BP 98/76 mmHg  Pulse 87  Temp(Src) 100 F (37.8 C) (Oral)  Resp 20  SpO2 98%  LMP 02/17/2011   Physical Exam  Constitutional: She is oriented to person, place, and time. She appears well-developed and well-nourished. No distress.  Nontoxic/nonseptic appearing  HENT:  Head:  Normocephalic and atraumatic.  Right Ear: Tympanic membrane, external ear and ear canal normal.  Left Ear: Tympanic membrane, external ear and ear canal normal.  Nose: Mucosal edema present. No rhinorrhea.  Mouth/Throat: Uvula is midline, oropharynx is clear and moist and mucous membranes are normal. No oropharyngeal exudate.  Eyes: Conjunctivae and EOM are normal. Pupils are equal, round, and reactive to light. No scleral icterus.  Neck: Normal range of motion.  No nuchal rigidity or meningismus  Cardiovascular: Normal rate, regular rhythm and intact distal pulses.   Pulmonary/Chest: Effort normal and breath sounds normal. No respiratory distress. She has no wheezes. She has no rales.  Dry, nonproductive cough. Respirations even and unlabored. Lungs clear.  Musculoskeletal: Normal range of motion.  Neurological: She is alert and oriented to person, place, and time. She exhibits normal muscle tone. Coordination normal.  Patient ambulatory with steady gait.  Skin: Skin is warm and dry. No rash noted. She is not diaphoretic. No erythema. No pallor.  Psychiatric: She has a normal mood and affect. Her behavior is normal.  Nursing note and vitals reviewed.   ED Course  Procedures (including critical care time)  DIAGNOSTIC STUDIES: Oxygen Saturation is 98% on RA, normal by my interpretation.    COORDINATION OF CARE: 3:13 AM-Discussed treatment plan which includes CXR with pt at bedside and pt agreed to plan.   Labs Review Labs Reviewed - No data to display  Imaging Review Dg Chest 2 View  10/18/2015  CLINICAL DATA:  Acute onset of productive cough and congestion. Body aches. Fever. Initial encounter. EXAM: CHEST  2 VIEW COMPARISON:  Chest radiograph performed 08/06/2015 FINDINGS: The lungs are well-aerated and clear. There is no evidence of focal opacification, pleural effusion or pneumothorax. The heart is normal in size; the mediastinal contour is within normal limits. No acute osseous  abnormalities are seen. IMPRESSION: No acute cardiopulmonary process seen. Electronically Signed   By: Garald Balding M.D.   On: 10/18/2015 03:34   I have personally reviewed and evaluated these images as part of my medical decision-making.   EKG Interpretation None       Medications  fluticasone (FLONASE) 50 MCG/ACT nasal spray 2 spray (2 sprays Each Nare Given 10/18/15 0403)  ipratropium-albuterol (DUONEB) 0.5-2.5 (3) MG/3ML nebulizer solution 3 mL (3 mLs Nebulization Given 10/18/15 0330)  benzonatate (TESSALON) capsule 100 mg (100 mg Oral Given 10/18/15 0328)  predniSONE (DELTASONE) tablet 60 mg (60 mg Oral Given 10/18/15 0328)  acetaminophen (TYLENOL) tablet 650 mg (650 mg Oral Given 10/18/15 0328)  albuterol (PROVENTIL HFA;VENTOLIN HFA) 108 (90 Base) MCG/ACT inhaler 2 puff (2 puffs Inhalation Given 10/18/15 0404)    MDM  Final diagnoses:  Viral syndrome    Patient with symptoms consistent with viral illness, suspect influenza. Vitals are stable, afebrile. No signs of dehydration, tolerating oral fluids. Lungs are clear. CXR negative for PNA. No hypoxia today. Patient will be discharged with instructions for supportive care. Patient will also be given a cough suppressant. Primary care follow up advised for recheck in urgent precautions given. Patient discharged in good condition with no unaddressed concerns  I personally performed the services described in this documentation, which was scribed in my presence. The recorded information has been reviewed and is accurate.    Filed Vitals:   10/18/15 0238  BP: 98/76  Pulse: 87  Temp: 100 F (37.8 C)  TempSrc: Oral  Resp: 20  SpO2: 98%        Antonietta Breach, PA-C 10/18/15 0405  Orpah Greek, MD 10/18/15 [id number]

## 2015-10-18 NOTE — ED Notes (Signed)
Pt states that she has had generalized body aches, cough and headache x 3 days. Alert and oriented.

## 2015-10-18 NOTE — Discharge Instructions (Signed)
Take Tessalon as needed for cough. Use Flonase as prescribed for nasal congestion. Take prednisone for cough and to improve breathing in the feeling of chest tightness. You may use an albuterol inhaler, 2 puffs every 4-6 hours, as needed for cough and shortness of breath. Take Tylenol for fever and ibuprofen for body aches. Follow-up with your primary care doctor for a recheck of symptoms.  Influenza, Adult Influenza ("the flu") is a viral infection of the respiratory tract. It occurs more often in winter months because people spend more time in close contact with one another. Influenza can make you feel very sick. Influenza easily spreads from person to person (contagious). CAUSES  Influenza is caused by a virus that infects the respiratory tract. You can catch the virus by breathing in droplets from an infected person's cough or sneeze. You can also catch the virus by touching something that was recently contaminated with the virus and then touching your mouth, nose, or eyes. RISKS AND COMPLICATIONS You may be at risk for a more severe case of influenza if you smoke cigarettes, have diabetes, have chronic heart disease (such as heart failure) or lung disease (such as asthma), or if you have a weakened immune system. Elderly people and pregnant women are also at risk for more serious infections. The most common problem of influenza is a lung infection (pneumonia). Sometimes, this problem can require emergency medical care and may be life threatening. SIGNS AND SYMPTOMS  Symptoms typically last 4 to 10 days and may include:  Fever.  Chills.  Headache, body aches, and muscle aches.  Sore throat.  Chest discomfort and cough.  Poor appetite.  Weakness or feeling tired.  Dizziness.  Nausea or vomiting. DIAGNOSIS  Diagnosis of influenza is often made based on your history and a physical exam. A nose or throat swab test can be done to confirm the diagnosis. TREATMENT  In mild cases,  influenza goes away on its own. Treatment is directed at relieving symptoms. For more severe cases, your health care provider may prescribe antiviral medicines to shorten the sickness. Antibiotic medicines are not effective because the infection is caused by a virus, not by bacteria. HOME CARE INSTRUCTIONS  Take medicines only as directed by your health care provider.  Use a cool mist humidifier to make breathing easier.  Get plenty of rest until your temperature returns to normal. This usually takes 3 to 4 days.  Drink enough fluid to keep your urine clear or pale yellow.  Cover yourmouth and nosewhen coughing or sneezing,and wash your handswellto prevent thevirusfrom spreading.  Stay homefromwork orschool untilthe fever is gonefor at least 71full day. PREVENTION  An annual influenza vaccination (flu shot) is the best way to avoid getting influenza. An annual flu shot is now routinely recommended for all adults in the [place] IF:  You experiencechest pain, yourcough worsens,or you producemore mucus.  Youhave nausea,vomiting, ordiarrhea.  Your fever returns or gets worse. SEEK IMMEDIATE MEDICAL CARE IF:  You havetrouble breathing, you become short of breath,or your skin ornails becomebluish.  You have severe painor stiffnessin the neck.  You develop a sudden headache, or pain in the face or ear.  You have nausea or vomiting that you cannot control. MAKE SURE YOU:   Understand these instructions.  Will watch your condition.  Will get help right away if you are not doing well or get worse.   This information is not intended to replace advice given to you by your health care  provider. Make sure you discuss any questions you have with your health care provider.   Document Released: 08/03/2000 Document Revised: 08/27/2014 Document Reviewed: 11/05/2011 Elsevier Interactive Patient Education [employer].

## 2015-11-09 ENCOUNTER — Encounter: Payer: Self-pay | Admitting: *Deleted

## 2015-11-09 ENCOUNTER — Ambulatory Visit (INDEPENDENT_AMBULATORY_CARE_PROVIDER_SITE_OTHER): Payer: Medicare HMO | Admitting: *Deleted

## 2015-11-09 VITALS — BP 97/60 | HR 63 | Temp 97.7°F | Wt 191.4 lb

## 2015-11-09 DIAGNOSIS — N3001 Acute cystitis with hematuria: Secondary | ICD-10-CM

## 2015-11-09 LAB — POCT URINALYSIS DIP (DEVICE)
Bilirubin Urine: NEGATIVE
GLUCOSE, UA: NEGATIVE mg/dL
Ketones, ur: NEGATIVE mg/dL
Leukocytes, UA: NEGATIVE
Nitrite: NEGATIVE
PH: 5.5 (ref 5.0–8.0)
PROTEIN: NEGATIVE mg/dL
Specific Gravity, Urine: 1.025 (ref 1.005–1.030)
UROBILINOGEN UA: 0.2 mg/dL (ref 0.0–1.0)

## 2015-11-09 MED ORDER — SULFAMETHOXAZOLE-TRIMETHOPRIM 800-160 MG PO TABS: 1.0000 | ORAL_TABLET | Freq: Two times a day (BID) | ORAL | Status: DC

## 2015-11-09 MED ORDER — FLAVOXATE HCL 100 MG PO TABS: 100.0000 mg | ORAL_TABLET | Freq: Three times a day (TID) | ORAL | Status: DC | PRN

## 2015-11-11 LAB — URINE CULTURE
COLONY COUNT: NO GROWTH
Organism ID, Bacteria: NO GROWTH

## 2015-12-25 ENCOUNTER — Encounter (HOSPITAL_COMMUNITY): Payer: Self-pay | Admitting: Emergency Medicine

## 2015-12-25 ENCOUNTER — Emergency Department (HOSPITAL_COMMUNITY): Payer: Medicare HMO

## 2015-12-25 ENCOUNTER — Emergency Department (HOSPITAL_COMMUNITY)
Admission: EM | Admit: 2015-12-25 | Discharge: 2015-12-25 | Disposition: A | Discharge status: Home / Self Care | Payer: Medicare HMO | Attending: Emergency Medicine | Admitting: Emergency Medicine

## 2015-12-25 DIAGNOSIS — K219 Gastro-esophageal reflux disease without esophagitis: Secondary | ICD-10-CM | POA: Insufficient documentation

## 2015-12-25 DIAGNOSIS — Z79899 Other long term (current) drug therapy: Secondary | ICD-10-CM | POA: Insufficient documentation

## 2015-12-25 DIAGNOSIS — R05 Cough: Secondary | ICD-10-CM | POA: Diagnosis not present

## 2015-12-25 DIAGNOSIS — J069 Acute upper respiratory infection, unspecified: Secondary | ICD-10-CM | POA: Insufficient documentation

## 2015-12-25 DIAGNOSIS — Z7951 Long term (current) use of inhaled steroids: Secondary | ICD-10-CM | POA: Diagnosis not present

## 2015-12-25 DIAGNOSIS — F329 Major depressive disorder, single episode, unspecified: Secondary | ICD-10-CM | POA: Insufficient documentation

## 2015-12-25 DIAGNOSIS — Z792 Long term (current) use of antibiotics: Secondary | ICD-10-CM | POA: Insufficient documentation

## 2015-12-25 DIAGNOSIS — Z7952 Long term (current) use of systemic steroids: Secondary | ICD-10-CM | POA: Diagnosis not present

## 2015-12-25 DIAGNOSIS — R0602 Shortness of breath: Secondary | ICD-10-CM | POA: Diagnosis not present

## 2015-12-25 DIAGNOSIS — F17210 Nicotine dependence, cigarettes, uncomplicated: Secondary | ICD-10-CM | POA: Diagnosis not present

## 2015-12-25 DIAGNOSIS — R059 Cough, unspecified: Secondary | ICD-10-CM

## 2015-12-25 DIAGNOSIS — E669 Obesity, unspecified: Secondary | ICD-10-CM | POA: Diagnosis not present

## 2015-12-25 DIAGNOSIS — J111 Influenza due to unidentified influenza virus with other respiratory manifestations: Secondary | ICD-10-CM | POA: Diagnosis not present

## 2015-12-25 DIAGNOSIS — Z791 Long term (current) use of non-steroidal anti-inflammatories (NSAID): Secondary | ICD-10-CM | POA: Insufficient documentation

## 2015-12-25 DIAGNOSIS — R069 Unspecified abnormalities of breathing: Secondary | ICD-10-CM | POA: Diagnosis not present

## 2015-12-25 MED ORDER — CETIRIZINE HCL 10 MG PO TABS: 10.0000 mg | ORAL_TABLET | Freq: Every day | ORAL | Status: DC

## 2015-12-25 MED ORDER — FLUTICASONE PROPIONATE 50 MCG/ACT NA SUSP: 1.0000 | Freq: Every day | NASAL | Status: DC

## 2015-12-25 MED ORDER — IBUPROFEN 200 MG PO TABS
600.0000 mg | ORAL_TABLET | Freq: Once | ORAL | Status: AC
  Administered 2015-12-25: 600 mg via ORAL
  Filled 2015-12-25: qty 3

## 2015-12-25 MED ORDER — IPRATROPIUM-ALBUTEROL 0.5-2.5 (3) MG/3ML IN SOLN
3.0000 mL | Freq: Once | RESPIRATORY_TRACT | Status: AC
  Administered 2015-12-25: 3 mL via RESPIRATORY_TRACT
  Filled 2015-12-25: qty 3

## 2015-12-25 MED ORDER — FLUTICASONE PROPIONATE 50 MCG/ACT NA SUSP
1.0000 | Freq: Every day | NASAL | Status: DC
Start: 2015-12-25 — End: 2016-07-09

## 2015-12-25 MED ORDER — ALBUTEROL SULFATE HFA 108 (90 BASE) MCG/ACT IN AERS
2.0000 | INHALATION_SPRAY | Freq: Four times a day (QID) | RESPIRATORY_TRACT | Status: DC | PRN
  Administered 2015-12-25: 2 via RESPIRATORY_TRACT
  Filled 2015-12-25: qty 6.7

## 2015-12-25 MED ORDER — BENZONATATE 100 MG PO CAPS: 100.0000 mg | ORAL_CAPSULE | Freq: Three times a day (TID) | ORAL | Status: DC | PRN

## 2015-12-25 NOTE — ED Provider Notes (Signed)
CSN: [id number]     Arrival date & time 12/25/15  1142 History  By signing my name below, I, Hansel Feinstein, attest that this documentation has been prepared under the direction and in the presence of  Gay Filler, PA-C. Electronically Signed: Hansel Feinstein, ED Scribe. 12/25/2015. 1:02 PM.    Chief Complaint  Patient presents with  . Cough   The history is provided by the patient. No language interpreter was used.   HPI Comments: Nicole [name] is a 55 y.o. female who presents to the Emergency Department complaining of cough and head congestion. Cough initially started one week ago, intermittently productive with yellow phlegm and associated sore throat and chest tenderness secondary to cough. Per pt, her cough has been improving since onset and is now producing clear sputum. Pt states her symptoms were improving earlier in the week before worsening yesterday. She reports nasal congestion, sinus pressure and rhinorrhea onset yesterday. SOB onset today. She states her SOB is currently resolved and describes the SOB as though she was unable to inhale adequately. She describes her chest tenderness as "lung inflammation".  She denies fever, chills, night sweats, wheezing, abdominal pain, nausea, emesis, eye pain or discharge, difficulty tolerating secretions or difficulty swallowing. Pt states she has been taking advil and cough drops with moderate relief. 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 No recent long distance travel, immobilization, surgery, hormone use. No h/o PE/DVT or cancer in the last 6 months. No h/o asthma, COPD.   Past Medical History  Diagnosis Date  . Perimenopause   . Palpitations     negative cardiac w/u per Dr. Verl Blalock 2/09  . Anemia   . Depression     bipolar  . History of migraine headaches     headaches reported as migraines  . GERD (gastroesophageal reflux disease)   . Obesity   . Reflux esophagitis   . Sleep apnea     No CPAP   Past  Surgical History  Procedure Laterality Date  . Tubal ligation  1987  . Exploratory laparotomy  2000    x2 (Dr. Kendall Flack)  . Herpes simplex virus dfa    . Ectopic pregnancy surgery     Family History  Problem Relation Age of Onset  . Hypertension Mother   . Diabetes Mother   . Heart disease Mother   . Thyroid disease Mother   . Heart attack Mother   . Glaucoma Mother   . Breast cancer Maternal Aunt   . Pancreatic cancer Maternal Uncle   . Pancreatic cancer Other     family history  . Colon cancer Neg Hx   . Diabetes Sister   . Heart attack Maternal Grandmother   . Heart attack Maternal Grandfather    Social History  Substance Use Topics  . Smoking status: Current Some Day Smoker -- 30 years    Types: Cigarettes    Last Attempt to Quit: 07/20/2011  . Smokeless tobacco: Never Used     Comment: Few  . Alcohol Use: 0.0 oz/week    0 Standard drinks or equivalent per week     Comment: Rare glass of wine   OB History    No data available     Review of Systems  Constitutional: Positive for fatigue. Negative for fever, chills and diaphoresis.  HENT: Positive for congestion, rhinorrhea, sinus pressure and sore throat. Negative for ear pain and postnasal drip.   Eyes: Negative for pain and discharge.  Respiratory: Positive for cough and shortness of breath (resolved). Negative for wheezing.   Cardiovascular: Positive for chest pain (tendnerness secondary to cough ). Negative for leg swelling.  Gastrointestinal: Negative for nausea, vomiting and abdominal pain.  Neurological: Negative for headaches.   Allergies  Other  Home Medications   Prior to Admission medications   Medication Sig Start Date End Date Taking? Authorizing Provider  acetaminophen (TYLENOL) 500 MG tablet Take 2 tablets (1,000 mg total) by mouth every 8 (eight) hours as needed for mild pain, moderate pain or fever. Patient not taking: Reported on 11/09/2015 10/18/15   Antonietta Breach, PA-C  benzonatate  (TESSALON) 100 MG capsule Take 1 capsule (100 mg total) by mouth every 8 (eight) hours. Patient not taking: Reported on 11/09/2015 10/18/15   Antonietta Breach, PA-C  benzonatate (TESSALON) 100 MG capsule Take 1 capsule (100 mg total) by mouth 3 (three) times daily as needed for cough. 12/25/15   Clayton Bibles, PA-C  cetirizine (ZYRTEC) 10 MG tablet Take 1 tablet (10 mg total) by mouth daily. 12/25/15   Clayton Bibles, PA-C  Cholecalciferol (VITAMIN D) 2000 UNITS tablet Take 2,000 Units by mouth daily. Reported on 11/09/2015    Historical Provider, MD  flavoxATE (URISPAS) 100 MG tablet Take 1 tablet (100 mg total) by mouth 3 (three) times daily as needed for bladder spasms. 11/09/15   Emily Filbert, MD  fluticasone (FLONASE) 50 MCG/ACT nasal spray Place 1 spray into both nostrils daily. 12/25/15   Clayton Bibles, PA-C  ibuprofen (ADVIL,MOTRIN) 600 MG tablet Take 1 tablet (600 mg total) by mouth every 6 (six) hours as needed for fever, mild pain or moderate pain. Patient not taking: Reported on 11/09/2015 10/18/15   Antonietta Breach, PA-C  predniSONE (DELTASONE) 20 MG tablet Take 2 tablets (40 mg total) by mouth daily. Patient not taking: Reported on 11/09/2015 10/18/15   Antonietta Breach, PA-C  sulfamethoxazole-trimethoprim (BACTRIM DS,SEPTRA DS) 800-160 MG tablet Take 1 tablet by mouth 2 (two) times daily. 11/09/15   Emily Filbert, MD   BP 119/81 mmHg  Pulse 89  Temp(Src) 98.3 F (36.8 C) (Oral)  Resp 16  Wt 86.183 kg  SpO2 98%  LMP 02/17/2011 Physical Exam  Constitutional: She appears well-developed and well-nourished.  HENT:  Head: Normocephalic and atraumatic.  Right Ear: Tympanic membrane is bulging. Tympanic membrane is not erythematous.  Left Ear: Tympanic membrane is bulging. Tympanic membrane is not erythematous.  Nose: No rhinorrhea. Right sinus exhibits maxillary sinus tenderness. Right sinus exhibits no frontal sinus tenderness. Left sinus exhibits maxillary sinus tenderness. Left sinus exhibits no frontal sinus  tenderness.  Mouth/Throat: Uvula is midline, oropharynx is clear and moist and mucous membranes are normal. No trismus in the jaw. No oropharyngeal exudate, posterior oropharyngeal edema, posterior oropharyngeal erythema or tonsillar abscesses.  TMs mildly bulging bilaterally. Fluid noted behind TM b/l.  No erythema or injection. Maxillary sinus pressure bilaterally. No frontal sinus pressure. Able to tolerate secretions.   Eyes: Conjunctivae and EOM are normal. Pupils are equal, round, and reactive to light. Right eye exhibits no discharge. Left eye exhibits no discharge. No scleral icterus.  Neck: Normal range of motion. Neck supple. No JVD present. No tracheal deviation present. No thyromegaly present.  Cardiovascular: Normal rate, regular rhythm and normal heart sounds.  Exam reveals no gallop and no friction rub.   No murmur heard. Pulmonary/Chest: Effort normal and breath sounds normal. No accessory muscle usage or stridor. No respiratory distress. She has no wheezes. She has no  rales. She exhibits tenderness (slightly TTP over the chest wall).  Abdominal: She exhibits no distension.  Musculoskeletal: Normal range of motion.  Lymphadenopathy:    She has no cervical adenopathy.  Neurological: She is alert. Coordination normal.  Skin: Skin is warm and dry.  Psychiatric: She has a normal mood and affect. Her behavior is normal.  Nursing note and vitals reviewed.   ED Course  Procedures (including critical care time) DIAGNOSTIC STUDIES: Oxygen Saturation is 97% on RA, normal by my interpretation.    COORDINATION OF CARE: 12:57 PM Discussed treatment plan with pt at bedside which includes CXR, DuoNeb, flonase and pt agreed to plan.  Imaging Review Dg Chest 2 View  12/25/2015  CLINICAL DATA:  Patient with cough and shortness of breath. Palpitations. EXAM: CHEST  2 VIEW COMPARISON:  Chest radiograph 10/18/2015. FINDINGS: Stable cardiac and mediastinal contours. No consolidative pulmonary  opacities. No pleural effusion or pneumothorax. Regional skeleton is unremarkable. Biapical pleural parenchymal thickening, stable. IMPRESSION: No active cardiopulmonary disease. Electronically Signed   By: Lovey Newcomer M.D.   On: 12/25/2015 14:24   I have personally reviewed and evaluated these images as part of my medical decision-making.   MDM   Final diagnoses:  Upper respiratory infection  Cough    Patient presents to ED with complaint of cough and head congestion. Afebrile, nontoxic, VSS. Patient received nebulizer treatment and ibuprofen in ED. Chest x-ray negative for acute cardiopulmonary process - doubt pneumonia, pleural effusion, or pneumothorax. Well's criteria 0 - doubt PE. Following nebulizer treatment, patient endorsed improvement in breathing, lungs remained CTABL, and O2 sats stable.  Suspect patient has upper respiratory infection. D/c'd with inhaler, cetirizine, flonase, and tessalon perles.  Discussed result, findings, treatment, and follow up  with patient.  Pt given return precautions.  Pt verbalizes understanding and agrees with plan.       I personally performed the services described in this documentation, which was scribed in my presence. The recorded information has been reviewed and is accurate.   Roxanna Mew, PA-C 12/25/15 [place], MD 01/01/16 [id number]

## 2015-12-25 NOTE — ED Notes (Signed)
Radiology reports delayed per radiology-PA advised

## 2015-12-25 NOTE — Discharge Instructions (Signed)
Read the information below.  Use the prescribed medication as directed.  Please discuss all new medications with your pharmacist.  You may return to the Emergency Department at any time for worsening condition or any new symptoms that concern you. Take 1-2 puffs of your inhaler every 6 hours as needed for relief of shortness of breath. If you develop fever, chills, profuse night sweating, shortness of breath, chest pain, coughing up blood, or leg swelling/pain return to the ED.   Upper Respiratory Infection, Adult Most upper respiratory infections (URIs) are caused by a virus. A URI affects the nose, throat, and upper air passages. The most common type of URI is often called "the common cold." HOME CARE   Take medicines only as told by your doctor.  Gargle warm saltwater or take cough drops to comfort your throat as told by your doctor.  Use a warm mist humidifier or inhale steam from a shower to increase air moisture. This may make it easier to breathe.  Drink enough fluid to keep your pee (urine) clear or pale yellow.  Eat soups and other clear broths.  Have a healthy diet.  Rest as needed.  Go back to work when your fever is gone or your doctor says it is okay.  You may need to stay home longer to avoid giving your URI to others.  You can also wear a face mask and wash your hands often to prevent spread of the virus.  Use your inhaler more if you have asthma.  Do not use any tobacco products, including cigarettes, chewing tobacco, or electronic cigarettes. If you need help quitting, ask your doctor. GET HELP IF:  You are getting worse, not better.  Your symptoms are not helped by medicine.  You have chills.  You are getting more short of breath.  You have brown or red mucus.  You have yellow or brown discharge from your nose.  You have pain in your face, especially when you bend forward.  You have a fever.  You have puffy (swollen) neck glands.  You have pain while  swallowing.  You have white areas in the back of your throat. GET HELP RIGHT AWAY IF:   You have very bad or constant:  Headache.  Ear pain.  Pain in your forehead, behind your eyes, and over your cheekbones (sinus pain).  Chest pain.  You have long-lasting (chronic) lung disease and any of the following:  Wheezing.  Long-lasting cough.  Coughing up blood.  A change in your usual mucus.  You have a stiff neck.  You have changes in your:  Vision.  Hearing.  Thinking.  Mood. MAKE SURE YOU:   Understand these instructions.  Will watch your condition.  Will get help right away if you are not doing well or get worse.   This information is not intended to replace advice given to you by your health care provider. Make sure you discuss any questions you have with your health care provider.   Document Released: 01/23/2008 Document Revised: 12/21/2014 Document Reviewed: 11/11/2013 Elsevier Interactive Patient Education 2016 Elsevier Inc.  Cough, Adult A cough helps to clear your throat and lungs. A cough may last only 2-3 weeks (acute), or it may last longer than 8 weeks (chronic). Many different things can cause a cough. A cough may be a sign of an illness or another medical condition. HOME CARE  Pay attention to any changes in your cough.  Take medicines only as told by your doctor.  If you were prescribed an antibiotic medicine, take it as told by your doctor. Do not stop taking it even if you start to feel better.  Talk with your doctor before you try using a cough medicine.  Drink enough fluid to keep your pee (urine) clear or pale yellow.  If the air is dry, use a cold steam vaporizer or humidifier in your home.  Stay away from things that make you cough at work or at home.  If your cough is worse at night, try using extra pillows to raise your head up higher while you sleep.  Do not smoke, and try not to be around smoke. If you need help quitting,  ask your doctor.  Do not have caffeine.  Do not drink alcohol.  Rest as needed. GET HELP IF:  You have new problems (symptoms).  You cough up yellow fluid (pus).  Your cough does not get better after 2-3 weeks, or your cough gets worse.  Medicine does not help your cough and you are not sleeping well.  You have pain that gets worse or pain that is not helped with medicine.  You have a fever.  You are losing weight and you do not know why.  You have night sweats. GET HELP RIGHT AWAY IF:  You cough up blood.  You have trouble breathing.  Your heartbeat is very fast.   This information is not intended to replace advice given to you by your health care provider. Make sure you discuss any questions you have with your health care provider.   Document Released: 04/19/2011 Document Revised: 04/27/2015 Document Reviewed: 10/13/2014 Elsevier Interactive Patient Education [employer].

## 2015-12-25 NOTE — ED Notes (Addendum)
Per EMS, pt c/o SOB. [facility]. Also c/o cold like symptoms, started x 2 weeks ago, worse over the last couple of days.  Runny nose, sore throat, post tussive chest pain. Hx of bronchitis. Dry cough, lungs clear in all fields, does not appear to be in any obvious distress in triage.

## 2015-12-27 ENCOUNTER — Encounter: Payer: Self-pay | Admitting: Internal Medicine

## 2015-12-27 ENCOUNTER — Ambulatory Visit (INDEPENDENT_AMBULATORY_CARE_PROVIDER_SITE_OTHER): Payer: Medicare HMO | Admitting: Internal Medicine

## 2015-12-27 VITALS — BP 119/70 | HR 84 | Temp 97.9°F | Ht 65.5 in | Wt 186.4 lb

## 2015-12-27 DIAGNOSIS — F3160 Bipolar disorder, current episode mixed, unspecified: Secondary | ICD-10-CM

## 2015-12-27 DIAGNOSIS — D649 Anemia, unspecified: Secondary | ICD-10-CM | POA: Diagnosis not present

## 2015-12-27 DIAGNOSIS — G4733 Obstructive sleep apnea (adult) (pediatric): Secondary | ICD-10-CM

## 2015-12-27 DIAGNOSIS — L819 Disorder of pigmentation, unspecified: Secondary | ICD-10-CM | POA: Insufficient documentation

## 2015-12-27 DIAGNOSIS — F319 Bipolar disorder, unspecified: Secondary | ICD-10-CM

## 2015-12-27 DIAGNOSIS — N950 Postmenopausal bleeding: Secondary | ICD-10-CM | POA: Diagnosis not present

## 2015-12-27 DIAGNOSIS — J069 Acute upper respiratory infection, unspecified: Secondary | ICD-10-CM | POA: Diagnosis not present

## 2015-12-27 DIAGNOSIS — R51 Headache: Secondary | ICD-10-CM

## 2015-12-27 DIAGNOSIS — B9789 Other viral agents as the cause of diseases classified elsewhere: Secondary | ICD-10-CM

## 2015-12-27 DIAGNOSIS — G44221 Chronic tension-type headache, intractable: Secondary | ICD-10-CM

## 2015-12-27 DIAGNOSIS — F22 Delusional disorders: Secondary | ICD-10-CM | POA: Insufficient documentation

## 2015-12-27 HISTORY — DX: Obstructive sleep apnea (adult) (pediatric): G47.33

## 2015-12-27 LAB — POCT GLYCOSYLATED HEMOGLOBIN (HGB A1C): HEMOGLOBIN A1C: 5.8

## 2015-12-27 LAB — GLUCOSE, CAPILLARY: Glucose-Capillary: 89 mg/dL (ref 65–99)

## 2015-12-27 MED ORDER — GUAIFENESIN ER 600 MG PO TB12: 600.0000 mg | ORAL_TABLET | Freq: Two times a day (BID) | ORAL | Status: DC

## 2015-12-27 NOTE — Assessment & Plan Note (Signed)
Patient had features concerning of hypomania on exam today with pressured speech, jumping from topic to topic (though organized), and reporting she stays up all night. I advised her that I would like for her to see a psychiatrist to help manage her bipolar disorder as she is currently not on any medications. I told her that it is fine for her to receive counseling services from [facility], but that she needed to see a psychiatrist for evaluation. Referral to psychiatry placed.

## 2015-12-27 NOTE — Progress Notes (Signed)
Subjective:    Patient ID: Nicole [name], female    DOB: Apr 18, 1961, 55 y.o.   MRN: [id number]  HPI Nicole [name] is a 55yo woman with PMHx of bipolar disorder, headaches, and OSA who presents today for follow up of her bipolar disorder.  Bipolar Disorder: Reports she was diagnosed with "depression, anxiety, and bipolar disorder" a "long time ago." She is currently not on any medications and is not following with a psychiatrist. She reports she will start seeing a counselor at [facility]. She states that she is not feeling depressed, but that she feels "great" and is "a happy person."   Normocytic Anemia: Her Hgb has remained stable in the 11.0-12.0 range since 2011. She had iron studies in 2011 which showed a ferritin of 25. Her MCV has consistently been in the 90s. She denies any history of GI bleeding. She had a colonoscopy in 2013 that was normal. She denies symptoms of fatigue, dizziness, SOB.    OSA: She had a split night sleep study in 2015 which diagnosed her with OSA. She reports she never picked up her CPAP machine and has never used CPAP. She reports she stays up all night most nights and will take naps during the day.   URI: She was evaluated in the ED on 5/7 for an URI. She was prescribed tessalon pearls, nasal spray, and cetirizine but has not picked up these medications. She states she is still coughing up phlegm. She reports she tried some "cough syrup with codeine" that "worked like Oceanographer." She is adamant about getting this medication. She describes having pleuritic chest pain as well from all the coughing. Denies any sore throat, fevers, sinus pressure/congestion, and SOB.   "Rash" on Ankle: Reports she has a rash on her left ankle that has been present for the last 3 months. She states the rash turned a dark red and makes her bone hurt. She also notes a few other spots where she has a rash on her right upper thigh. She states she was told by her podiatrist to see a dermatologist. She  denies any itching of the lesions.   Headaches: Reports she has had headaches since childhood. Headaches typically occur in the frontal and occipital regions. She states they are pulsating and "feel like I was hit with a bat." She notes stress makes her headaches worse. Denies any photophobia or phonophobia. Her last headache was a few months ago. Typically tylenol or ibuprofen alleviate her pain. She follows with Dr. Leta Baptist with neurology.   Vaginal Bleeding: Reports her last menstrual period was 3 years ago. She noticed last week that she had some vaginal bleeding ("spotting") in her underwear. She states the blood was dark brown. This lasted for 3 days, stopped, and then started again a few days later. Her last pap smear was in Feb 2016 and was normal. She reports she follows with a provider at the [facility] clinic.   Review of Systems General: Denies night sweats, changes in weight, changes in appetite HEENT: Denies ear pain, changes in vision CV: Reports palpitations occasionally- states she was evaluated by her cardiologist and had no issues. Denies orthopnea Pulm: See HPI GI: Denies abdominal pain, nausea, vomiting, diarrhea, constipation GU: Denies dysuria, hematuria, frequency Msk: Denies muscle cramps, joint pains Neuro: Denies weakness, numbness, tingling Skin: Denies bruising Psych: Denies hallucinations    Objective:   Physical Exam General: alert, sitting up, pressured speech on exam HEENT: [place]/AT, EOMI, sclera anicteric, mucus membranes moist CV:  RRR, no m/g/r Pulm: mild end expiratory wheezes bilaterally, coughing throughout exam Abd: BS+, soft, non-tender Ext: warm, no peripheral edema Neuro: alert and oriented x 3 Psych: hypomania with pressured speech, easily distracted, moves from topic to topic of conversation without pausing Skin: There is a hyperpigmented lesion on her left lateral ankle on top of the lateral malleolus. She also has a few spots on her right upper  thigh.     Assessment & Plan:  Please refer to A&P documentation.

## 2015-12-27 NOTE — Assessment & Plan Note (Signed)
She is having post-menopausal bleeding. She had a pelvic US in 2013 which showed a normal sized uterus. I recommended for her to follow up as soon as possible with her OBGYN at the [facility]. She will likely require endometrial biopsy.

## 2015-12-27 NOTE — Assessment & Plan Note (Signed)
Last Hgb stable at 11.7 in Dec 2016. Will get anemia panel today to further evaluate her anemia. She does not seem to be having GI bleeding, but did report post-menopausal bleeding.

## 2015-12-27 NOTE — Assessment & Plan Note (Signed)
She has hyperpigmented areas on her left lateral ankle (over the lateral malleolus) and a few spots on her upper thigh. They are nonpruritic. Will have her evaluated by dermatology.

## 2015-12-27 NOTE — Assessment & Plan Note (Signed)
Her headaches do not seem to be consistent with migraines as they are bilateral and no other associated symptoms. They are pulsatile which can be seen with migraines. Recommended to continue tylenol and/or ibuprofen as needed. She follows with Dr. Juan Quam (neurology). Per his last note, she is supposed to take Sumitriptan prn but patient denies ever taking this medication. Will continue to monitor.

## 2015-12-27 NOTE — Assessment & Plan Note (Signed)
She has never used a CPAP machine since her sleep study in 2015. Unfortunately she will require a repeat split night study in the future to get her new CPAP titration results. Additionally, the patient displayed signs of hypomania on exam today and reported staying up all night. This study may need to wait until her bipolar disorder is treated. Will rediscuss this issue once she has been evaluated by psych.

## 2015-12-27 NOTE — Patient Instructions (Signed)
General Instructions: - Start taking Mucinex to help with your cough - Also start tessalon pearls and cetirizine (Zyrtec) to see if this helps your cold symptoms - I have placed a referral to psychiatry for your bipolar disorder and dermatology for your skin lesions - Please see OBGYN or the women's clinic for your vaginal bleeding - Blood work today  Please bring your medicines with you each time you come to clinic.  Medicines may include prescription medications, over-the-counter medications, herbal remedies, eye drops, vitamins, or other pills.   Progress Toward Treatment Goals:  No flowsheet data found.  Self Care Goals & Plans:  Self Care Goal 12/27/2015  Manage my medications take my medicines as prescribed; bring my medications to every visit; refill my medications on time  Eat healthy foods eat more vegetables; eat foods that are low in salt; eat baked foods instead of fried foods  Be physically active find an activity I enjoy  Stop smoking cut down the number of cigarettes smoked    No flowsheet data found.   Care Management & Community Referrals:  No flowsheet data found.

## 2015-12-27 NOTE — Assessment & Plan Note (Signed)
She is still having symptoms of a URI. I explained it may take a few weeks for her symptoms to resolve. Recommended to pick up the cetirizine and tessalon pearls that were prescribed by the ED. Also recommended mucinex to help bring up phlegm.

## 2015-12-28 LAB — ANEMIA PROFILE B
BASOS: 1 %
Basophils Absolute: 0 10*3/uL (ref 0.0–0.2)
EOS (ABSOLUTE): 0.3 10*3/uL (ref 0.0–0.4)
Eos: 6 %
FERRITIN: 125 ng/mL (ref 15–150)
FOLATE: 13.7 ng/mL (ref >3.0)
Hematocrit: 37.3 % (ref 34.0–46.6)
Hemoglobin: 12.1 g/dL (ref 11.1–15.9)
IMMATURE GRANS (ABS): 0 10*3/uL (ref 0.0–0.1)
IMMATURE GRANULOCYTES: 0 %
Iron Saturation: 17 % (ref 15–55)
Iron: 48 ug/dL (ref 27–159)
LYMPHS ABS: 1.7 10*3/uL (ref 0.7–3.1)
LYMPHS: 34 %
MCH: 30.1 pg (ref 26.6–33.0)
MCHC: 32.4 g/dL (ref 31.5–35.7)
MCV: 93 fL (ref 79–97)
Monocytes Absolute: 0.4 10*3/uL (ref 0.1–0.9)
Monocytes: 8 %
NEUTROS PCT: 51 %
Neutrophils Absolute: 2.5 10*3/uL (ref 1.4–7.0)
PLATELETS: 255 10*3/uL (ref 150–379)
RBC: 4.02 x10E6/uL (ref 3.77–5.28)
RDW: 14 % (ref 12.3–15.4)
Retic Ct Pct: 0.7 % (ref 0.6–2.6)
TIBC: 282 ug/dL (ref 250–450)
UIBC: 234 ug/dL (ref 131–425)
Vitamin B-12: 362 pg/mL (ref 211–946)
WBC: 5 10*3/uL (ref 3.4–10.8)

## 2015-12-28 LAB — CMP14 + ANION GAP
A/G RATIO: 1.9 (ref 1.2–2.2)
ALBUMIN: 4.4 g/dL (ref 3.5–5.5)
ALK PHOS: 72 IU/L (ref 39–117)
ALT: 13 IU/L (ref 0–32)
AST: 15 IU/L (ref 0–40)
Anion Gap: 20 mmol/L — ABNORMAL HIGH (ref 10.0–18.0)
BILIRUBIN TOTAL: 0.3 mg/dL (ref 0.0–1.2)
BUN / CREAT RATIO: 13 (ref 9–23)
BUN: 11 mg/dL (ref 6–24)
CHLORIDE: 101 mmol/L (ref 96–106)
CO2: 22 mmol/L (ref 18–29)
Calcium: 9.3 mg/dL (ref 8.7–10.2)
Creatinine, Ser: 0.83 mg/dL (ref 0.57–1.00)
GFR calc non Af Amer: 79 mL/min/{1.73_m2} (ref >59)
GFR, EST AFRICAN AMERICAN: 91 mL/min/{1.73_m2} (ref >59)
GLOBULIN, TOTAL: 2.3 g/dL (ref 1.5–4.5)
Glucose: 87 mg/dL (ref 65–99)
Potassium: 4.1 mmol/L (ref 3.5–5.2)
SODIUM: 143 mmol/L (ref 134–144)
TOTAL PROTEIN: 6.7 g/dL (ref 6.0–8.5)

## 2015-12-28 LAB — LIPID PANEL
CHOL/HDL RATIO: 3.5 ratio (ref 0.0–4.4)
CHOLESTEROL TOTAL: 203 mg/dL — AB (ref 100–199)
HDL: 58 mg/dL (ref >39)
LDL Calculated: 125 mg/dL — ABNORMAL HIGH (ref 0–99)
Triglycerides: 99 mg/dL (ref 0–149)
VLDL CHOLESTEROL CAL: 20 mg/dL (ref 5–40)

## 2015-12-28 LAB — VITAMIN D 25 HYDROXY (VIT D DEFICIENCY, FRACTURES): VIT D 25 HYDROXY: 21.9 ng/mL — AB (ref 30.0–100.0)

## 2015-12-28 LAB — HIV ANTIBODY (ROUTINE TESTING W REFLEX): HIV Screen 4th Generation wRfx: NONREACTIVE

## 2015-12-28 LAB — TSH: TSH: 0.574 u[IU]/mL (ref 0.450–4.500)

## 2015-12-29 NOTE — Progress Notes (Signed)
Case discussed with Dr. Rivet soon after the resident saw the patient. We reviewed the resident's history and exam and pertinent patient test results. I agree with the assessment, diagnosis, and plan of care documented in the resident's note. 

## 2015-12-30 ENCOUNTER — Encounter (HOSPITAL_COMMUNITY): Payer: Self-pay | Admitting: Nurse Practitioner

## 2015-12-30 ENCOUNTER — Ambulatory Visit (HOSPITAL_COMMUNITY)
Admission: EM | Admit: 2015-12-30 | Discharge: 2015-12-30 | Disposition: A | Discharge status: Home / Self Care | Payer: Medicare HMO | Attending: Emergency Medicine | Admitting: Emergency Medicine

## 2015-12-30 DIAGNOSIS — J988 Other specified respiratory disorders: Secondary | ICD-10-CM

## 2015-12-30 DIAGNOSIS — J40 Bronchitis, not specified as acute or chronic: Secondary | ICD-10-CM | POA: Diagnosis not present

## 2015-12-30 DIAGNOSIS — B9689 Other specified bacterial agents as the cause of diseases classified elsewhere: Secondary | ICD-10-CM

## 2015-12-30 MED ORDER — METHYLPREDNISOLONE ACETATE 80 MG/ML IJ SUSP
80.0000 mg | Freq: Once | INTRAMUSCULAR | Status: AC
  Administered 2015-12-30: 80 mg via INTRAMUSCULAR

## 2015-12-30 MED ORDER — METHYLPREDNISOLONE ACETATE 80 MG/ML IJ SUSP
INTRAMUSCULAR | Status: AC
Start: 2015-12-30 — End: 2015-12-30
  Filled 2015-12-30: qty 1

## 2015-12-30 MED ORDER — AZITHROMYCIN 250 MG PO TABS: 250.0000 mg | ORAL_TABLET | Freq: Every day | ORAL | Status: DC

## 2015-12-30 NOTE — ED Notes (Signed)
Pt c/o 1 week history of cough, congestion, body aches. Denies any fevers. She has tried several OTC remedies including nasal sprays, cough drops, nyquil with no relief. Her friend gave her some medicine with codeine in it which made her feel better. She lives in the shelter and states she has been around lots of sick people. She is alert and breathing easily

## 2015-12-30 NOTE — ED Provider Notes (Signed)
CSN: [id number]     Arrival date & time 12/30/15  1424 History   First MD Initiated Contact with Patient 12/30/15 1545     Chief Complaint  Patient presents with  . URI   (Consider location/radiation/quality/duration/timing/severity/associated sxs/prior Treatment) HPI Comments: Patient is a 55 yo female who presents with ongoing productive cough, fevers, chills and body aches. She was at [facility] 4 days ago and treated for a viral respiratory infection. CXR was normal. WBC was normal. Today she reports worsening.   Patient is a 55 y.o. female presenting with URI. The history is provided by the patient.  URI Presenting symptoms: cough, fatigue and fever   Associated symptoms: wheezing     Past Medical History  Diagnosis Date  . Perimenopause   . Palpitations     negative cardiac w/u per Dr. Verl Blalock 2/09  . Anemia   . Depression     bipolar  . History of migraine headaches     headaches reported as migraines  . GERD (gastroesophageal reflux disease)   . Obesity   . Reflux esophagitis   . Sleep apnea     No CPAP   Past Surgical History  Procedure Laterality Date  . Tubal ligation  1987  . Exploratory laparotomy  2000    x2 (Dr. Kendall Flack)  . Herpes simplex virus dfa    . Ectopic pregnancy surgery     Family History  Problem Relation Age of Onset  . Hypertension Mother   . Diabetes Mother   . Heart disease Mother   . Thyroid disease Mother   . Heart attack Mother   . Glaucoma Mother   . Breast cancer Maternal Aunt   . Pancreatic cancer Maternal Uncle   . Pancreatic cancer Other     family history  . Colon cancer Neg Hx   . Diabetes Sister   . Heart attack Maternal Grandmother   . Heart attack Maternal Grandfather   . Hypertension Sister    Social History  Substance Use Topics  . Smoking status: Current Some Day Smoker -- 36 years    Types: Cigarettes    Last Attempt to Quit: 07/20/2011  . Smokeless tobacco: Never Used  . Alcohol Use: 0.0 oz/week    0 Standard  drinks or equivalent per week     Comment: Rare glass of wine   OB History    No data available     Review of Systems  Constitutional: Positive for fever, chills and fatigue.  HENT: Negative.   Respiratory: Positive for cough and wheezing.   Skin: Negative.     Allergies  Other  Home Medications   Prior to Admission medications   Medication Sig Start Date End Date Taking? Authorizing Provider  azithromycin (ZITHROMAX) 250 MG tablet Take 1 tablet (250 mg total) by mouth daily. Take first 2 tablets together, then 1 every day until finished. 12/30/15   Bjorn Pippin, PA-C  benzonatate (TESSALON) 100 MG capsule Take 1 capsule (100 mg total) by mouth 3 (three) times daily as needed for cough. 12/25/15   Clayton Bibles, PA-C  cetirizine (ZYRTEC) 10 MG tablet Take 1 tablet (10 mg total) by mouth daily. 12/25/15   Clayton Bibles, PA-C  Cholecalciferol (VITAMIN D) 2000 UNITS tablet Take 2,000 Units by mouth daily. Reported on 11/09/2015    Historical Provider, MD  fluticasone (FLONASE) 50 MCG/ACT nasal spray Place 1 spray into both nostrils daily. 12/25/15   Clayton Bibles, PA-C  guaiFENesin (MUCINEX) 600 MG 12  hr tablet Take 1 tablet (600 mg total) by mouth 2 (two) times daily. 12/27/15   Juliet Rude, MD   Meds Ordered and Administered this Visit   Medications  methylPREDNISolone acetate (DEPO-MEDROL) injection 80 mg (not administered)    BP 116/70 mmHg  Pulse 89  Temp(Src) 98.7 F (37.1 C) (Oral)  Resp 16  SpO2 100%  LMP 02/17/2011 No data found.   Physical Exam  Constitutional: She is oriented to person, place, and time. She appears well-developed and well-nourished. No distress.  HENT:  Head: Normocephalic and atraumatic.  Mouth/Throat: Oropharynx is clear and moist.  Cardiovascular: Normal rate and regular rhythm.   Pulmonary/Chest: Effort normal. No respiratory distress. She has wheezes.  Rhonci, wheeze and course BS in the bases  Neurological: She is alert and oriented to person,  place, and time.  Skin: Skin is warm and dry. No rash noted. She is not diaphoretic.  Psychiatric: Her behavior is normal.  Nursing note and vitals reviewed.   ED Course  Procedures (including critical care time)  Labs Review Labs Reviewed - No data to display  Imaging Review No results found.   Visual Acuity Review  Right Eye Distance:   Left Eye Distance:   Bilateral Distance:    Right Eye Near:   Left Eye Near:    Bilateral Near:         MDM   1. Bacterial respiratory infection   2. Bronchitis    Based on duration and worsening of symptoms will cover with ABX. DepoMedrol 80mg  IM given for wheeze, inflammation. Continue use of supportive care with inhalers and cough suppressants. FU prn or ED if worsens.     Bjorn Pippin, PA-C 12/30/15 1617

## 2015-12-30 NOTE — Discharge Instructions (Signed)
You have a respiratory infection. Continue with the inhaler and meds given by the ED. Will add an injection for lung inflammation and also a antibiotic to cover for bacterial infection. Drink lots of water. Delsym is the best cough medication it is OTC.   Upper Respiratory Infection, Adult Most upper respiratory infections (URIs) are a viral infection of the air passages leading to the lungs. A URI affects the nose, throat, and upper air passages. The most common type of URI is nasopharyngitis and is typically referred to as "the common cold." URIs run their course and usually go away on their own. Most of the time, a URI does not require medical attention, but sometimes a bacterial infection in the upper airways can follow a viral infection. This is called a secondary infection. Sinus and middle ear infections are common types of secondary upper respiratory infections. Bacterial pneumonia can also complicate a URI. A URI can worsen asthma and chronic obstructive pulmonary disease (COPD). Sometimes, these complications can require emergency medical care and may be life threatening.  CAUSES Almost all URIs are caused by viruses. A virus is a type of germ and can spread from one person to another.  RISKS FACTORS You may be at risk for a URI if:   You smoke.   You have chronic heart or lung disease.  You have a weakened defense (immune) system.   You are very [place] or very old.   You have nasal allergies or asthma.  You work in crowded or poorly ventilated areas.  You work in health care facilities or schools. SIGNS AND SYMPTOMS  Symptoms typically develop 2-3 days after you come in contact with a cold virus. Most viral URIs last 7-10 days. However, viral URIs from the influenza virus (flu virus) can last 14-18 days and are typically more severe. Symptoms may include:   Runny or stuffy (congested) nose.   Sneezing.   Cough.   Sore throat.   Headache.   Fatigue.   Fever.    Loss of appetite.   Pain in your forehead, behind your eyes, and over your cheekbones (sinus pain).  Muscle aches.  DIAGNOSIS  Your health care provider may diagnose a URI by:  Physical exam.  Tests to check that your symptoms are not due to another condition such as:  Strep throat.  Sinusitis.  Pneumonia.  Asthma. TREATMENT  A URI goes away on its own with time. It cannot be cured with medicines, but medicines may be prescribed or recommended to relieve symptoms. Medicines may help:  Reduce your fever.  Reduce your cough.  Relieve nasal congestion. HOME CARE INSTRUCTIONS   Take medicines only as directed by your health care provider.   Gargle warm saltwater or take cough drops to comfort your throat as directed by your health care provider.  Use a warm mist humidifier or inhale steam from a shower to increase air moisture. This may make it easier to breathe.  Drink enough fluid to keep your urine clear or pale yellow.   Eat soups and other clear broths and maintain good nutrition.   Rest as needed.   Return to work when your temperature has returned to normal or as your health care provider advises. You may need to stay home longer to avoid infecting others. You can also use a face mask and careful hand washing to prevent spread of the virus.  Increase the usage of your inhaler if you have asthma.   Do not use any tobacco  products, including cigarettes, chewing tobacco, or electronic cigarettes. If you need help quitting, ask your health care provider. PREVENTION  The best way to protect yourself from getting a cold is to practice good hygiene.   Avoid oral or hand contact with people with cold symptoms.   Wash your hands often if contact occurs.  There is no clear evidence that vitamin C, vitamin E, echinacea, or exercise reduces the chance of developing a cold. However, it is always recommended to get plenty of rest, exercise, and practice good  nutrition.  SEEK MEDICAL CARE IF:   You are getting worse rather than better.   Your symptoms are not controlled by medicine.   You have chills.  You have worsening shortness of breath.  You have brown or red mucus.  You have yellow or brown nasal discharge.  You have pain in your face, especially when you bend forward.  You have a fever.  You have swollen neck glands.  You have pain while swallowing.  You have white areas in the back of your throat. SEEK IMMEDIATE MEDICAL CARE IF:   You have severe or persistent:  Headache.  Ear pain.  Sinus pain.  Chest pain.  You have chronic lung disease and any of the following:  Wheezing.  Prolonged cough.  Coughing up blood.  A change in your usual mucus.  You have a stiff neck.  You have changes in your:  Vision.  Hearing.  Thinking.  Mood. MAKE SURE YOU:   Understand these instructions.  Will watch your condition.  Will get help right away if you are not doing well or get worse.   This information is not intended to replace advice given to you by your health care provider. Make sure you discuss any questions you have with your health care provider.   Document Released: 01/30/2001 Document Revised: 12/21/2014 Document Reviewed: 11/11/2013 Elsevier Interactive Patient Education [employer].

## 2016-01-02 ENCOUNTER — Telehealth: Payer: Self-pay | Admitting: Licensed Clinical Social Worker

## 2016-01-02 NOTE — Telephone Encounter (Signed)
CSW placed call to Ms. Spelman to follow up on psychiatry referral.  Pt remains agreeable to psychiatry and plans to continue with her therapist at [facility].  CSW inquired if pt would like provider in [place] or [place] area.  Pt states she lives in [place], currently in a shelter.  Ms. Deist states "I would like to go to any place other than [facility]".  Referral to [facility].  CSW offered to provide Ms. Rijos with the address and phone number, pt states she is aware of the location and will await call from agency for scheduling.

## 2016-01-14 ENCOUNTER — Encounter (HOSPITAL_COMMUNITY): Payer: Self-pay | Admitting: Emergency Medicine

## 2016-01-14 ENCOUNTER — Emergency Department (HOSPITAL_COMMUNITY)
Admission: EM | Admit: 2016-01-14 | Discharge: 2016-01-14 | Disposition: A | Discharge status: Home / Self Care | Payer: Medicare HMO | Attending: Emergency Medicine | Admitting: Emergency Medicine

## 2016-01-14 ENCOUNTER — Emergency Department (HOSPITAL_COMMUNITY): Payer: Medicare HMO

## 2016-01-14 DIAGNOSIS — K2970 Gastritis, unspecified, without bleeding: Secondary | ICD-10-CM | POA: Diagnosis not present

## 2016-01-14 DIAGNOSIS — F17210 Nicotine dependence, cigarettes, uncomplicated: Secondary | ICD-10-CM | POA: Insufficient documentation

## 2016-01-14 DIAGNOSIS — R42 Dizziness and giddiness: Secondary | ICD-10-CM | POA: Diagnosis not present

## 2016-01-14 DIAGNOSIS — R10814 Left lower quadrant abdominal tenderness: Secondary | ICD-10-CM | POA: Diagnosis not present

## 2016-01-14 DIAGNOSIS — Z79899 Other long term (current) drug therapy: Secondary | ICD-10-CM | POA: Diagnosis not present

## 2016-01-14 DIAGNOSIS — E669 Obesity, unspecified: Secondary | ICD-10-CM | POA: Insufficient documentation

## 2016-01-14 DIAGNOSIS — F319 Bipolar disorder, unspecified: Secondary | ICD-10-CM | POA: Insufficient documentation

## 2016-01-14 DIAGNOSIS — Z791 Long term (current) use of non-steroidal anti-inflammatories (NSAID): Secondary | ICD-10-CM | POA: Diagnosis not present

## 2016-01-14 LAB — COMPREHENSIVE METABOLIC PANEL
ALK PHOS: 57 U/L (ref 38–126)
ALT: 22 U/L (ref 14–54)
ANION GAP: 5 (ref 5–15)
AST: 17 U/L (ref 15–41)
Albumin: 4 g/dL (ref 3.5–5.0)
BILIRUBIN TOTAL: 0.7 mg/dL (ref 0.3–1.2)
BUN: 17 mg/dL (ref 6–20)
CALCIUM: 8.9 mg/dL (ref 8.9–10.3)
CO2: 27 mmol/L (ref 22–32)
Chloride: 105 mmol/L (ref 101–111)
Creatinine, Ser: 0.94 mg/dL (ref 0.44–1.00)
GLUCOSE: 122 mg/dL — AB (ref 65–99)
Potassium: 3.8 mmol/L (ref 3.5–5.1)
Sodium: 137 mmol/L (ref 135–145)
TOTAL PROTEIN: 6.6 g/dL (ref 6.5–8.1)

## 2016-01-14 LAB — URINALYSIS, ROUTINE W REFLEX MICROSCOPIC
Bilirubin Urine: NEGATIVE
Glucose, UA: NEGATIVE mg/dL
HGB URINE DIPSTICK: NEGATIVE
Ketones, ur: NEGATIVE mg/dL
Leukocytes, UA: NEGATIVE
Nitrite: NEGATIVE
Protein, ur: NEGATIVE mg/dL
SPECIFIC GRAVITY, URINE: 1.026 (ref 1.005–1.030)
pH: 6.5 (ref 5.0–8.0)

## 2016-01-14 LAB — DIFFERENTIAL
BASOS PCT: 0 %
Basophils Absolute: 0 10*3/uL (ref 0.0–0.1)
EOS ABS: 0.2 10*3/uL (ref 0.0–0.7)
Eosinophils Relative: 3 %
LYMPHS ABS: 2 10*3/uL (ref 0.7–4.0)
Lymphocytes Relative: 40 %
MONO ABS: 0.4 10*3/uL (ref 0.1–1.0)
Monocytes Relative: 7 %
NEUTROS ABS: 2.5 10*3/uL (ref 1.7–7.7)
Neutrophils Relative %: 50 %

## 2016-01-14 LAB — CBC
HCT: 34.1 % — ABNORMAL LOW (ref 36.0–46.0)
Hemoglobin: 11.4 g/dL — ABNORMAL LOW (ref 12.0–15.0)
MCH: 30.8 pg (ref 26.0–34.0)
MCHC: 33.4 g/dL (ref 30.0–36.0)
MCV: 92.2 fL (ref 78.0–100.0)
PLATELETS: 190 10*3/uL (ref 150–400)
RBC: 3.7 MIL/uL — ABNORMAL LOW (ref 3.87–5.11)
RDW: 13.8 % (ref 11.5–15.5)
WBC: 5.1 10*3/uL (ref 4.0–10.5)

## 2016-01-14 LAB — RAPID URINE DRUG SCREEN, HOSP PERFORMED
Amphetamines: NOT DETECTED
BENZODIAZEPINES: NOT DETECTED
Barbiturates: NOT DETECTED
COCAINE: NOT DETECTED
Opiates: NOT DETECTED
Tetrahydrocannabinol: POSITIVE — AB

## 2016-01-14 LAB — ETHANOL: Alcohol, Ethyl (B): <5 mg/dL (ref <5)

## 2016-01-14 LAB — CBG MONITORING, ED: Glucose-Capillary: 140 mg/dL — ABNORMAL HIGH (ref 65–99)

## 2016-01-14 MED ORDER — MECLIZINE HCL 25 MG PO TABS: 25.0000 mg | ORAL_TABLET | Freq: Three times a day (TID) | ORAL | Status: DC | PRN

## 2016-01-14 MED ORDER — SODIUM CHLORIDE 0.9 % IV BOLUS (SEPSIS)
1000.0000 mL | Freq: Once | INTRAVENOUS | Status: AC
  Administered 2016-01-14: 1000 mL via INTRAVENOUS

## 2016-01-14 NOTE — Discharge Instructions (Signed)

## 2016-01-14 NOTE — ED Notes (Addendum)
Per EMS pt complaint of dizziness while shopping in [place]; no LOC. Upon triage pt states dizziness for a few days, difficulty urinating for 2 weeks, and LLQ pain today.

## 2016-01-14 NOTE — ED Provider Notes (Signed)
CSN: [id number]     Arrival date & time 01/14/16  1504 History   First MD Initiated Contact with Patient 01/14/16 1511     Chief Complaint  Patient presents with  . Dizziness   HPI Patient presents to the emergency room for evaluation of dizziness. Patient states for the last few days she's had a sensation that something was leaking out of the top of her head. Patient knows something wasn't really leaking out of her head but that's the best way that she can describe the sensation that she was feeling.  This persisted. Today the symptoms were worse while she was out shopping. She also felt like she was having numbness around her mouth. She also thinks she got a little bit overheated. She called EMS and on the way over she noted that she felt like the room was spinning at the ambulance turn. She felt as if she was intoxicated. Symptoms have now resolved and she is feeling better. She denies any trouble with focal numbness or weakness although her arms and legs feel heavy all over. She denies any difficulty with her speech. No visual problems. She denies any trouble with abdominal pain. She denies any trouble with chest pain. She denies any urinary symptoms to me. Past Medical History  Diagnosis Date  . Perimenopause   . Palpitations     negative cardiac w/u per Dr. Verl Blalock 2/09  . Anemia   . Depression     bipolar  . History of migraine headaches     headaches reported as migraines  . GERD (gastroesophageal reflux disease)   . Obesity   . Reflux esophagitis   . Sleep apnea     No CPAP   Past Surgical History  Procedure Laterality Date  . Tubal ligation  1987  . Exploratory laparotomy  2000    x2 (Dr. Kendall Flack)  . Herpes simplex virus dfa    . Ectopic pregnancy surgery     Family History  Problem Relation Age of Onset  . Hypertension Mother   . Diabetes Mother   . Heart disease Mother   . Thyroid disease Mother   . Heart attack Mother   . Glaucoma Mother   . Breast cancer  Maternal Aunt   . Pancreatic cancer Maternal Uncle   . Pancreatic cancer Other     family history  . Colon cancer Neg Hx   . Diabetes Sister   . Heart attack Maternal Grandmother   . Heart attack Maternal Grandfather   . Hypertension Sister    Social History  Substance Use Topics  . Smoking status: Current Some Day Smoker -- 36 years    Types: Cigarettes    Last Attempt to Quit: 07/20/2011  . Smokeless tobacco: Never Used  . Alcohol Use: 0.0 oz/week    0 Standard drinks or equivalent per week     Comment: Rare glass of wine   OB History    No data available     Review of Systems  All other systems reviewed and are negative.     Allergies  Other  Home Medications   Prior to Admission medications   Medication Sig Start Date End Date Taking? Authorizing Provider  benzocaine (ORAJEL) 10 % mucosal gel Use as directed 1 application in the mouth or throat 2 (two) times daily as needed for mouth pain.   Yes Historical Provider, MD  Cholecalciferol (VITAMIN D) 2000 UNITS tablet Take 2,000 Units by mouth daily. Reported on 01/14/2016  Yes Historical Provider, MD  ibuprofen (ADVIL,MOTRIN) 200 MG tablet Take 400 mg by mouth every 6 (six) hours as needed for headache or moderate pain.    Yes Historical Provider, MD  azithromycin (ZITHROMAX) 250 MG tablet Take 1 tablet (250 mg total) by mouth daily. Take first 2 tablets together, then 1 every day until finished. Patient not taking: Reported on 01/14/2016 12/30/15   Bjorn Pippin, PA-C  benzonatate (TESSALON) 100 MG capsule Take 1 capsule (100 mg total) by mouth 3 (three) times daily as needed for cough. Patient not taking: Reported on 01/14/2016 12/25/15   Clayton Bibles, PA-C  cetirizine (ZYRTEC) 10 MG tablet Take 1 tablet (10 mg total) by mouth daily. Patient not taking: Reported on 01/14/2016 12/25/15   Clayton Bibles, PA-C  fluticasone [facility]) 50 MCG/ACT nasal spray Place 1 spray into both nostrils daily. Patient not taking: Reported on  01/14/2016 12/25/15   Clayton Bibles, PA-C  guaiFENesin (MUCINEX) 600 MG 12 hr tablet Take 1 tablet (600 mg total) by mouth 2 (two) times daily. Patient not taking: Reported on 01/14/2016 12/27/15   Juliet Rude, MD  meclizine (ANTIVERT) 25 MG tablet Take 1 tablet (25 mg total) by mouth 3 (three) times daily as needed for dizziness or nausea. 01/14/16   Dorie Rank, MD   BP 112/72 mmHg  Pulse 96  Temp(Src) 98.4 F (36.9 C) (Oral)  Resp 18  SpO2 98%  LMP 02/17/2011 Physical Exam  Constitutional: She is oriented to person, place, and time. She appears well-developed and well-nourished. No distress.  HENT:  Head: Normocephalic and atraumatic.  Right Ear: External ear normal.  Left Ear: External ear normal.  Mouth/Throat: Oropharynx is clear and moist.  Normal tympanic membranes bilaterally  Eyes: Conjunctivae are normal. Right eye exhibits no discharge. Left eye exhibits no discharge. No scleral icterus.  Neck: Neck supple. No tracheal deviation present.  Cardiovascular: Normal rate, regular rhythm and intact distal pulses.   Pulmonary/Chest: Effort normal and breath sounds normal. No stridor. No respiratory distress. She has no wheezes. She has no rales.  Abdominal: Soft. Bowel sounds are normal. She exhibits no distension. There is no tenderness. There is no rebound and no guarding.  Musculoskeletal: She exhibits no edema or tenderness.  Neurological: She is alert and oriented to person, place, and time. She has normal strength. No cranial nerve deficit (No facial droop, extraocular movements intact, tongue midline ) or sensory deficit. She exhibits normal muscle tone. She displays no seizure activity. Coordination normal.  No pronator drift bilateral upper extrem, able to hold both legs off bed for 5 seconds, sensation intact in all extremities, no visual field cuts, no left or right sided neglect, normal finger-nose exam bilaterally, no nystagmus noted   Skin: Skin is warm and dry. No rash noted.   Psychiatric: She has a normal mood and affect.  Nursing note and vitals reviewed.   ED Course  Procedures (including critical care time) Labs Review Labs Reviewed  CBC - Abnormal; Notable for the following:    RBC 3.70 (*)    Hemoglobin 11.4 (*)    HCT 34.1 (*)    All other components within normal limits  COMPREHENSIVE METABOLIC PANEL - Abnormal; Notable for the following:    Glucose, Bld 122 (*)    All other components within normal limits  URINE RAPID DRUG SCREEN, HOSP PERFORMED - Abnormal; Notable for the following:    Tetrahydrocannabinol POSITIVE (*)    All other components within normal limits  CBG  MONITORING, ED - Abnormal; Notable for the following:    Glucose-Capillary 140 (*)    All other components within normal limits  ETHANOL  DIFFERENTIAL  URINALYSIS, ROUTINE W REFLEX MICROSCOPIC (NOT AT [facility])    Imaging Review Ct Head Wo Contrast  01/14/2016  CLINICAL DATA:  Dizziness. EXAM: CT HEAD WITHOUT CONTRAST TECHNIQUE: Contiguous axial images were obtained from the base of the skull through the vertex without intravenous contrast. COMPARISON:  Brain MR dated 12/19/2013 and head CT dated 06/24/2008. FINDINGS: Normal appearing cerebral hemispheres and posterior fossa structures. Normal size and position of the ventricles. No intracranial hemorrhage, mass lesion or CT evidence of acute infarction. Unremarkable bones and included paranasal sinuses. IMPRESSION: Normal examination. Electronically Signed   By: Claudie Revering M.D.   On: 01/14/2016 16:02   I have personally reviewed and evaluated these images and lab results as part of my medical decision-making.   EKG Interpretation   Date/Time:  Saturday Jan 14 2016 15:21:03 EDT Ventricular Rate:  92 PR Interval:  153 QRS Duration: 73 QT Interval:  351 QTC Calculation: 434 R Axis:   46 Text Interpretation:  Sinus rhythm Minimal ST elevation, inferior leads No  significant change since last tracing Confirmed by Taher Vannote  MD-J,  Delinda Malan  [id number]) on 01/14/2016 3:34:37 PM      MDM   Final diagnoses:  Dizziness    Patient presented to the emergency room with complaints of dizziness.  She has no focal neurologic deficits on exam. Laboratory tests, CT scan are reassuring. Patient was in the hospital earlier this month for URI symptoms. It's possible her symptoms may be related to a viral labyrinthitis.  I doubt stroke or other acute emergent condition. We'll discharge home with follow-up with primary doctor next week. Meclizine when necessary    Dorie Rank, MD 01/14/16 1700

## 2016-02-08 DIAGNOSIS — L03032 Cellulitis of left toe: Secondary | ICD-10-CM | POA: Diagnosis not present

## 2016-02-09 ENCOUNTER — Telehealth: Payer: Self-pay | Admitting: Diagnostic Neuroimaging

## 2016-02-09 NOTE — Telephone Encounter (Signed)
LVM advising patient her follow up appointment on 02/28/16 needs to be changed.  Requested she call back to reschedule this appointment in a 30 minute time slot. Left name, number. Phone staff may reschedule this appointment, in a 30 min time slot.

## 2016-02-09 NOTE — Telephone Encounter (Signed)
Patient reports new problems with her memory that started about 1-1/2 mth ago. An appointment was made for 02/28/16.  Advised that the nurse would call if there was any other questions.

## 2016-02-10 NOTE — Telephone Encounter (Signed)
Spoke with patient to advise she needs to be seen in 30 appointment. Rescheduled her for 30 min appointment time the same day. Advised she arrive 15 min early. She repeated appt date/time/arrival time and verbalized agreement.

## 2016-02-13 ENCOUNTER — Ambulatory Visit: Payer: Medicare HMO | Admitting: Family Medicine

## 2016-02-24 DIAGNOSIS — B353 Tinea pedis: Secondary | ICD-10-CM | POA: Diagnosis not present

## 2016-02-28 ENCOUNTER — Ambulatory Visit: Payer: Medicare Other | Admitting: Diagnostic Neuroimaging

## 2016-02-28 ENCOUNTER — Ambulatory Visit: Payer: Self-pay | Admitting: Diagnostic Neuroimaging

## 2016-02-28 ENCOUNTER — Encounter: Payer: Self-pay | Admitting: Internal Medicine

## 2016-02-28 ENCOUNTER — Encounter: Payer: Medicare HMO | Admitting: Internal Medicine

## 2016-02-29 ENCOUNTER — Encounter: Payer: Self-pay | Admitting: Diagnostic Neuroimaging

## 2016-03-06 ENCOUNTER — Ambulatory Visit (HOSPITAL_COMMUNITY): Payer: Medicare HMO | Admitting: Psychiatry

## 2016-03-08 ENCOUNTER — Ambulatory Visit (HOSPITAL_COMMUNITY): Payer: Medicare HMO | Admitting: Psychiatry

## 2016-03-12 ENCOUNTER — Encounter: Payer: Self-pay | Admitting: Internal Medicine

## 2016-03-12 ENCOUNTER — Other Ambulatory Visit (HOSPITAL_COMMUNITY)
Admission: RE | Admit: 2016-03-12 | Discharge: 2016-03-12 | Disposition: A | Discharge status: Home / Self Care | Payer: Commercial Managed Care - HMO | Source: Ambulatory Visit | Attending: Internal Medicine | Admitting: Internal Medicine

## 2016-03-12 ENCOUNTER — Ambulatory Visit (INDEPENDENT_AMBULATORY_CARE_PROVIDER_SITE_OTHER): Payer: Commercial Managed Care - HMO | Admitting: Internal Medicine

## 2016-03-12 VITALS — BP 103/68 | Temp 98.4°F | Ht 65.5 in | Wt 184.2 lb

## 2016-03-12 DIAGNOSIS — N760 Acute vaginitis: Secondary | ICD-10-CM | POA: Diagnosis not present

## 2016-03-12 DIAGNOSIS — M25579 Pain in unspecified ankle and joints of unspecified foot: Secondary | ICD-10-CM

## 2016-03-12 DIAGNOSIS — R300 Dysuria: Secondary | ICD-10-CM | POA: Diagnosis not present

## 2016-03-12 DIAGNOSIS — N950 Postmenopausal bleeding: Secondary | ICD-10-CM | POA: Diagnosis not present

## 2016-03-12 LAB — POCT URINALYSIS DIPSTICK
Bilirubin, UA: NEGATIVE
Glucose, UA: NEGATIVE
Ketones, UA: NEGATIVE
LEUKOCYTES UA: NEGATIVE
Nitrite, UA: NEGATIVE
PROTEIN UA: NEGATIVE
Spec Grav, UA: 1.025
UROBILINOGEN UA: 0.2
pH, UA: 6.5

## 2016-03-12 NOTE — Addendum Note (Signed)
Addended by: Hulan Fray on: 03/12/2016 07:56 PM   Modules accepted: Orders

## 2016-03-12 NOTE — Patient Instructions (Addendum)
It was a pleasure seeing you today. Thank you for choosing [name] for your healthcare needs.   -- Please follow-up with GYN doctor for vaginal bleeding and odor -- Take 1 Zyrtec at night for allergies and ear fullness

## 2016-03-12 NOTE — Progress Notes (Signed)
   CC: Dysuria  HPI: [name] is a 55 y.o. female with a h/o of OSA, Normocytic anemia, Bipolar disorder and post-menopausal bleeding who presents with a one week history of burning with urination associated with an odor.  Please see problem-based charting for status of medical issues pertinent to this visit.     Past Medical History:  Diagnosis Date  . Anemia   . Depression    bipolar  . GERD (gastroesophageal reflux disease)   . History of migraine headaches    headaches reported as migraines  . Obesity   . Palpitations    negative cardiac w/u per Dr. Verl Blalock 2/09  . Perimenopause   . Reflux esophagitis   . Sleep apnea    No CPAP   Current Outpatient Rx  . Order #: [id number] Class: Print  . Order #: [id number] Class: Historical Med  . Order #: [id number] Class: Print  . Order #: [id number] Class: Print  . Order #: [id number] Class: Historical Med  . Order #: [id number] Class: Print  . Order #: [id number] Class: Normal  . Order #: [id number] Class: Historical Med  . Order #: [id number] Class: Print    Review of Systems: A complete ROS was negative except as per HPI.  Physical Exam: Vitals:   03/12/16 0833  BP: 103/68  Temp: 98.4 F (36.9 C)  TempSrc: Oral  SpO2: 100%  Weight: 184 lb 3.2 oz (83.6 kg)  Height: 5' 5.5" (1.664 m)   General appearance: alert and cooperative Head: Normocephalic, without obvious abnormality, atraumatic Back: symmetric, no curvature. ROM normal. No CVA tenderness. Lungs: clear to auscultation bilaterally Heart: regular rate and rhythm, S1, S2 normal, no murmur, click, rub or gallop Abdomen: soft, non-tender; bowel sounds normal; no masses,  no organomegaly  Assessment & Plan:  See encounters tab for problem based medical decision making. Patient seen with Dr. Angelia Mould  Signed: Ophelia Shoulder, MD 03/12/2016, 8:40 AM  Pager: [phone]

## 2016-03-12 NOTE — Assessment & Plan Note (Addendum)
Patient has a 1 week history of dysuria and a bad odor. She first noticed this about 1 week ago. She also endorses polyuria over this time. She denies any vaginal discharge. She is not sexually active. She has no CVA tenderness. She denies fevers, chills, n/v and abdominal pain. The DDx includes UTI vs Bacterial vaginosis or other infectious etiology. Less concerned about STI's given the patient is not currently sexually active. -- Urinalysis with cytology,culture and dipstick -- Patient preformed a vaginal swab for wet prep, after discussing options with the patient she desired to self-swab. I walked her through the procedure and she was able to complete this without any complications -- I will follow-up with the patient regarding results and we will make treatments as necessary pending the results

## 2016-03-12 NOTE — Assessment & Plan Note (Signed)
Patient has noticed post-menopausal bleeding. She had spotting of blood in her underwear 3 weeks ago. She has not noticed any blood since this time. She is not sexually active and had approximately 2 years after menopause without noticing any blood until several months ago when she noticed spotting. She denies any fevers, chills, night sweats or weight loss. This has been going on for several months and needs further work-up and management by a GYN specialist to rule out potential malignancy . -- Referral to GYN for workup of postmenopausal bleeding

## 2016-03-13 ENCOUNTER — Encounter: Payer: Self-pay | Admitting: Licensed Clinical Social Worker

## 2016-03-13 ENCOUNTER — Telehealth: Payer: Self-pay | Admitting: Internal Medicine

## 2016-03-13 LAB — URINALYSIS, COMPLETE
Bilirubin, UA: NEGATIVE
Glucose, UA: NEGATIVE
Ketones, UA: NEGATIVE
Leukocytes, UA: NEGATIVE
Nitrite, UA: NEGATIVE
PH UA: 6.5 (ref 5.0–7.5)
Protein, UA: NEGATIVE
RBC, UA: NEGATIVE
Specific Gravity, UA: 1.023 (ref 1.005–1.030)
Urobilinogen, Ur: 0.2 mg/dL (ref 0.2–1.0)

## 2016-03-13 LAB — MICROSCOPIC EXAMINATION: CASTS: NONE SEEN /LPF

## 2016-03-13 LAB — CERVICOVAGINAL ANCILLARY ONLY: WET PREP (BD AFFIRM): NEGATIVE

## 2016-03-13 NOTE — Progress Notes (Signed)
Internal Medicine Clinic Attending  I saw and evaluated the patient.  I personally confirmed the key portions of the history and exam documented by Dr. Taylor and I reviewed pertinent patient test results.  The assessment, diagnosis, and plan were formulated together and I agree with the documentation in the resident's note.  

## 2016-03-13 NOTE — Telephone Encounter (Signed)
I called the patient this morning and spoke with her on the phone regarding results of her tests. I told her that there is no need for treatment at this time. I discussed that the most important thing at this point would be to follow-up with the GYN physician. She was agreeable to this plan.

## 2016-04-03 ENCOUNTER — Encounter: Payer: Self-pay | Admitting: *Deleted

## 2016-04-07 ENCOUNTER — Encounter (HOSPITAL_COMMUNITY): Payer: Self-pay

## 2016-04-07 ENCOUNTER — Emergency Department (HOSPITAL_COMMUNITY)
Admission: EM | Admit: 2016-04-07 | Discharge: 2016-04-07 | Discharge status: Against Medical Advice | Payer: Commercial Managed Care - HMO | Attending: Emergency Medicine | Admitting: Emergency Medicine

## 2016-04-07 DIAGNOSIS — Z79899 Other long term (current) drug therapy: Secondary | ICD-10-CM | POA: Insufficient documentation

## 2016-04-07 DIAGNOSIS — Z7951 Long term (current) use of inhaled steroids: Secondary | ICD-10-CM | POA: Insufficient documentation

## 2016-04-07 DIAGNOSIS — R55 Syncope and collapse: Secondary | ICD-10-CM | POA: Diagnosis not present

## 2016-04-07 DIAGNOSIS — F419 Anxiety disorder, unspecified: Secondary | ICD-10-CM | POA: Diagnosis not present

## 2016-04-07 DIAGNOSIS — R42 Dizziness and giddiness: Secondary | ICD-10-CM | POA: Diagnosis not present

## 2016-04-07 DIAGNOSIS — F1290 Cannabis use, unspecified, uncomplicated: Secondary | ICD-10-CM | POA: Insufficient documentation

## 2016-04-07 DIAGNOSIS — R404 Transient alteration of awareness: Secondary | ICD-10-CM | POA: Diagnosis not present

## 2016-04-07 DIAGNOSIS — F17210 Nicotine dependence, cigarettes, uncomplicated: Secondary | ICD-10-CM | POA: Insufficient documentation

## 2016-04-07 DIAGNOSIS — R100 Acute abdomen: Secondary | ICD-10-CM | POA: Diagnosis not present

## 2016-04-07 HISTORY — DX: Hypotension, unspecified: I95.9

## 2016-04-07 LAB — BASIC METABOLIC PANEL
Anion gap: 6 (ref 5–15)
BUN: 16 mg/dL (ref 6–20)
CHLORIDE: 109 mmol/L (ref 101–111)
CO2: 25 mmol/L (ref 22–32)
Calcium: 9.4 mg/dL (ref 8.9–10.3)
Creatinine, Ser: 0.85 mg/dL (ref 0.44–1.00)
GFR calc Af Amer: >60 mL/min (ref >60)
GFR calc non Af Amer: >60 mL/min (ref >60)
GLUCOSE: 99 mg/dL (ref 65–99)
Potassium: 3.5 mmol/L (ref 3.5–5.1)
Sodium: 140 mmol/L (ref 135–145)

## 2016-04-07 LAB — CBC WITH DIFFERENTIAL/PLATELET
Basophils Absolute: 0 10*3/uL (ref 0.0–0.1)
Basophils Relative: 0 %
EOS PCT: 4 %
Eosinophils Absolute: 0.3 10*3/uL (ref 0.0–0.7)
HCT: 35.2 % — ABNORMAL LOW (ref 36.0–46.0)
HEMOGLOBIN: 11.6 g/dL — AB (ref 12.0–15.0)
LYMPHS ABS: 2 10*3/uL (ref 0.7–4.0)
LYMPHS PCT: 29 %
MCH: 30.7 pg (ref 26.0–34.0)
MCHC: 33 g/dL (ref 30.0–36.0)
MCV: 93.1 fL (ref 78.0–100.0)
MONOS PCT: 6 %
Monocytes Absolute: 0.4 10*3/uL (ref 0.1–1.0)
Neutro Abs: 4.2 10*3/uL (ref 1.7–7.7)
Neutrophils Relative %: 61 %
PLATELETS: 206 10*3/uL (ref 150–400)
RBC: 3.78 MIL/uL — AB (ref 3.87–5.11)
RDW: 14.1 % (ref 11.5–15.5)
WBC: 6.9 10*3/uL (ref 4.0–10.5)

## 2016-04-07 LAB — I-STAT CG4 LACTIC ACID, ED: Lactic Acid, Venous: 1 mmol/L (ref 0.5–1.9)

## 2016-04-07 MED ORDER — DIPHENHYDRAMINE HCL 25 MG PO CAPS: 25.0000 mg | ORAL_CAPSULE | Freq: Once | ORAL | Status: DC

## 2016-04-07 MED ORDER — SODIUM CHLORIDE 0.9 % IV BOLUS (SEPSIS): 1000.0000 mL | Freq: Once | INTRAVENOUS | Status: DC

## 2016-04-07 NOTE — ED Triage Notes (Signed)
Pt from a friend's home via EMS. Pt states that she was at a friend's house to see a dying friend. Pt became anxious and began feeling SOB. When EMS got there, pt began feeling better. Pt has no complaints at this time.

## 2016-04-07 NOTE — ED Provider Notes (Signed)
[facility] DEPT Provider Note   CSN: [id number] Arrival date & time: 04/07/16  1659     History   Chief Complaint Chief Complaint  Patient presents with  . Panic Attack    HPI [name] is a 55 y.o. female.  HPI 55 y.o. female with a h/o of OSA, anemia, bipolar disorder who comes in with cc of near fainting. Pt reports that she was going home after visiting family members, and while she was at the bus stop she started having dizziness, near syncope. She had associated palpitations and sweats. Pt called EMS, and her BP was in the 80s SBP. She reports that she has had low BP in the past and she has been asked to increase salt intake to help with her BP. Her BP doesn't usually run this low. She denies any drug use, alcohol use. Pt feels a lot better at the moment, she denies chest pain, dib.   Past Medical History:  Diagnosis Date  . Anemia   . Depression    bipolar  . GERD (gastroesophageal reflux disease)   . History of migraine headaches    headaches reported as migraines  . Hypotension   . Obesity   . Palpitations    negative cardiac w/u per Dr. Verl Blalock 2/09  . Perimenopause   . Reflux esophagitis   . Sleep apnea    No CPAP    Patient Active Problem List   Diagnosis Date Noted  . Dysuria 03/12/2016  . OSA (obstructive sleep apnea) 12/27/2015  . Upper respiratory infection 12/27/2015  . Chronic headaches 12/27/2015  . Post-menopausal bleeding 12/27/2015  . Hyperpigmentation of skin 12/27/2015  . Bipolar disorder ([place]) 04/14/2007  . Normocytic anemia 10/31/2005    Past Surgical History:  Procedure Laterality Date  . ECTOPIC PREGNANCY SURGERY    . EXPLORATORY LAPAROTOMY  2000   x2 (Dr. Kendall Flack)  . HERPES SIMPLEX VIRUS DFA    . TUBAL LIGATION  1987    OB History    No data available       Home Medications    Prior to Admission medications   Medication Sig Start Date End Date Taking? Authorizing Provider  azithromycin (ZITHROMAX) 250 MG  tablet Take 1 tablet (250 mg total) by mouth daily. Take first 2 tablets together, then 1 every day until finished. Patient not taking: Reported on 01/14/2016 12/30/15   Bjorn Pippin, PA-C  benzocaine (ORAJEL) 10 % mucosal gel Use as directed 1 application in the mouth or throat 2 (two) times daily as needed for mouth pain.    Historical Provider, MD  benzonatate (TESSALON) 100 MG capsule Take 1 capsule (100 mg total) by mouth 3 (three) times daily as needed for cough. Patient not taking: Reported on 01/14/2016 12/25/15   Clayton Bibles, PA-C  cetirizine (ZYRTEC) 10 MG tablet Take 1 tablet (10 mg total) by mouth daily. Patient not taking: Reported on 01/14/2016 12/25/15   Clayton Bibles, PA-C  Cholecalciferol (VITAMIN D) 2000 UNITS tablet Take 2,000 Units by mouth daily. Reported on 01/14/2016    Historical Provider, MD  fluticasone (FLONASE) 50 MCG/ACT nasal spray Place 1 spray into both nostrils daily. Patient not taking: Reported on 01/14/2016 12/25/15   Clayton Bibles, PA-C  guaiFENesin (MUCINEX) 600 MG 12 hr tablet Take 1 tablet (600 mg total) by mouth 2 (two) times daily. Patient not taking: Reported on 01/14/2016 12/27/15   Sindy Guadeloupe Rivet, MD  ibuprofen (ADVIL,MOTRIN) 200 MG tablet Take 400 mg by mouth  every 6 (six) hours as needed for headache or moderate pain.     Historical Provider, MD  meclizine (ANTIVERT) 25 MG tablet Take 1 tablet (25 mg total) by mouth 3 (three) times daily as needed for dizziness or nausea. 01/14/16   Dorie Rank, MD    Family History Family History  Problem Relation Age of Onset  . Hypertension Mother   . Diabetes Mother   . Heart disease Mother   . Thyroid disease Mother   . Heart attack Mother   . Glaucoma Mother   . Pancreatic cancer Other     family history  . Heart attack Maternal Grandmother   . Heart attack Maternal Grandfather   . Breast cancer Maternal Aunt   . Pancreatic cancer Maternal Uncle   . Diabetes Sister   . Hypertension Sister   . Colon cancer Neg Hx      Social History Social History  Substance Use Topics  . Smoking status: Current Some Day Smoker    Years: 36.00    Types: Cigarettes    Last attempt to quit: 07/20/2011  . Smokeless tobacco: Never Used  . Alcohol use 0.0 oz/week     Comment: Rare glass of wine     Allergies   Other   Review of Systems Review of Systems   ROS 10 Systems reviewed and are negative for acute change except as noted in the HPI.     Physical Exam Updated Vital Signs BP 94/67   Pulse 71   Temp 98.2 F (36.8 C) (Oral)   Resp 15   Ht 5\' 5"  (1.651 m)   Wt 184 lb (83.5 kg)   LMP 02/17/2011   SpO2 100%   BMI 30.62 kg/m   Physical Exam  Constitutional: She is oriented to person, place, and time. She appears well-developed.  HENT:  Head: Normocephalic and atraumatic.  Eyes: EOM are normal.  Neck: Normal range of motion. Neck supple.  Cardiovascular: Normal rate and intact distal pulses.   Pulmonary/Chest: Effort normal.  Abdominal: Soft. Bowel sounds are normal. She exhibits no distension and no mass. There is no tenderness.  Neurological: She is alert and oriented to person, place, and time.  Skin: Skin is warm and dry.  Nursing note and vitals reviewed.    ED Treatments / Results  Labs (all labs ordered are listed, but only abnormal results are displayed) Labs Reviewed  CBC WITH DIFFERENTIAL/PLATELET - Abnormal; Notable for the following:       Result Value   RBC 3.78 (*)    Hemoglobin 11.6 (*)    HCT 35.2 (*)    All other components within normal limits  BASIC METABOLIC PANEL  I-STAT CG4 LACTIC ACID, ED    EKG  EKG Interpretation None       Radiology No results found.  Procedures Procedures (including critical care time)  Medications Ordered in ED Medications - No data to display   Initial Impression / Assessment and Plan / ED Course  I have reviewed the triage vital signs and the nursing notes.  Pertinent labs & imaging results that were available  during my care of the patient were reviewed by me and considered in my medical decision making (see chart for details).  Clinical Course    Pt comes in with near syncope and is noted to be in bordering low BP. She has had no emesis, diarrhea, profound sweats. We will get orthostatics, start hydration and basic labs.  LATE ENTRY: I was informed  hour later that pt just decided to leave when she found out that we were going to place an iv and get labs. I never got to discuss any alternatives with the patient, she had already eloped by the time I was updated.  Final Clinical Impressions(s) / ED Diagnoses   Final diagnoses:  Near syncope    New Prescriptions Discharge Medication List as of 04/07/2016  6:58 PM       Varney Biles, MD 04/09/16 0210

## 2016-04-07 NOTE — ED Notes (Signed)
PT REFUSED IV NS BOLUS. PT STS WHY DO I NEED THAT FLUID. I'M LEAVING. PT LEFT.

## 2016-04-11 DIAGNOSIS — I950 Idiopathic hypotension: Secondary | ICD-10-CM

## 2016-04-17 ENCOUNTER — Ambulatory Visit: Payer: Medicare HMO | Admitting: Diagnostic Neuroimaging

## 2016-04-18 ENCOUNTER — Encounter: Payer: Self-pay | Admitting: Diagnostic Neuroimaging

## 2016-04-20 ENCOUNTER — Emergency Department (HOSPITAL_COMMUNITY)
Admission: EM | Admit: 2016-04-20 | Discharge: 2016-04-20 | Disposition: A | Discharge status: Home / Self Care | Payer: Commercial Managed Care - HMO | Attending: Emergency Medicine | Admitting: Emergency Medicine

## 2016-04-20 ENCOUNTER — Emergency Department (HOSPITAL_COMMUNITY): Payer: Commercial Managed Care - HMO

## 2016-04-20 ENCOUNTER — Encounter (HOSPITAL_COMMUNITY): Payer: Self-pay | Admitting: *Deleted

## 2016-04-20 DIAGNOSIS — R109 Unspecified abdominal pain: Secondary | ICD-10-CM | POA: Diagnosis not present

## 2016-04-20 DIAGNOSIS — F1290 Cannabis use, unspecified, uncomplicated: Secondary | ICD-10-CM | POA: Insufficient documentation

## 2016-04-20 DIAGNOSIS — R0602 Shortness of breath: Secondary | ICD-10-CM | POA: Diagnosis not present

## 2016-04-20 DIAGNOSIS — Z791 Long term (current) use of non-steroidal anti-inflammatories (NSAID): Secondary | ICD-10-CM | POA: Diagnosis not present

## 2016-04-20 DIAGNOSIS — R42 Dizziness and giddiness: Secondary | ICD-10-CM | POA: Diagnosis not present

## 2016-04-20 DIAGNOSIS — Z79899 Other long term (current) drug therapy: Secondary | ICD-10-CM | POA: Insufficient documentation

## 2016-04-20 DIAGNOSIS — F17210 Nicotine dependence, cigarettes, uncomplicated: Secondary | ICD-10-CM | POA: Diagnosis not present

## 2016-04-20 DIAGNOSIS — R0789 Other chest pain: Secondary | ICD-10-CM | POA: Diagnosis not present

## 2016-04-20 DIAGNOSIS — R112 Nausea with vomiting, unspecified: Secondary | ICD-10-CM | POA: Diagnosis present

## 2016-04-20 DIAGNOSIS — R55 Syncope and collapse: Secondary | ICD-10-CM | POA: Insufficient documentation

## 2016-04-20 LAB — COMPREHENSIVE METABOLIC PANEL
ALBUMIN: 4.5 g/dL (ref 3.5–5.0)
ALK PHOS: 51 U/L (ref 38–126)
ALT: 15 U/L (ref 14–54)
AST: 21 U/L (ref 15–41)
Anion gap: 6 (ref 5–15)
BILIRUBIN TOTAL: 0.9 mg/dL (ref 0.3–1.2)
BUN: 16 mg/dL (ref 6–20)
CALCIUM: 9.2 mg/dL (ref 8.9–10.3)
CO2: 26 mmol/L (ref 22–32)
CREATININE: 0.8 mg/dL (ref 0.44–1.00)
Chloride: 108 mmol/L (ref 101–111)
GFR calc Af Amer: >60 mL/min (ref >60)
GFR calc non Af Amer: >60 mL/min (ref >60)
GLUCOSE: 90 mg/dL (ref 65–99)
Potassium: 4.3 mmol/L (ref 3.5–5.1)
SODIUM: 140 mmol/L (ref 135–145)
TOTAL PROTEIN: 7.2 g/dL (ref 6.5–8.1)

## 2016-04-20 LAB — CBC
HEMATOCRIT: 35.4 % — AB (ref 36.0–46.0)
Hemoglobin: 11.6 g/dL — ABNORMAL LOW (ref 12.0–15.0)
MCH: 30.5 pg (ref 26.0–34.0)
MCHC: 32.8 g/dL (ref 30.0–36.0)
MCV: 93.2 fL (ref 78.0–100.0)
PLATELETS: 207 10*3/uL (ref 150–400)
RBC: 3.8 MIL/uL — ABNORMAL LOW (ref 3.87–5.11)
RDW: 14.2 % (ref 11.5–15.5)
WBC: 7.3 10*3/uL (ref 4.0–10.5)

## 2016-04-20 LAB — D-DIMER, QUANTITATIVE: D-Dimer, Quant: <0.27 ug/mL-FEU (ref 0.00–0.50)

## 2016-04-20 LAB — TROPONIN I: Troponin I: <0.03 ng/mL (ref <0.03)

## 2016-04-20 LAB — LIPASE, BLOOD: Lipase: 22 U/L (ref 11–51)

## 2016-04-20 MED ORDER — SODIUM CHLORIDE 0.9 % IV BOLUS (SEPSIS): 1000.0000 mL | Freq: Once | INTRAVENOUS | Status: DC

## 2016-04-20 NOTE — ED Triage Notes (Signed)
Ginger Ale given, encouraged pt to sip on liquids.

## 2016-04-20 NOTE — ED Notes (Signed)
Attempted to start IV x2 w/o success. Pt refused to have [place] IV placed and wants her meds given another way. Dr Regenia Skeeter advised of pt request. Primary RN Patty notified of no IV

## 2016-04-20 NOTE — ED Triage Notes (Signed)
Lab attempting to recollect labs due to clotting

## 2016-04-20 NOTE — ED Triage Notes (Signed)
Difficult to access pt, states she was seen here two weeks ago for ? LOC, now states she has had nausea and vomiting since, they states she has been ok, went to lunch today and started to feel sick again (ate omelet). 20 min post eating sick, sweating, diff breathing

## 2016-04-20 NOTE — ED Provider Notes (Addendum)
[facility] DEPT Provider Note   CSN: [id number] Arrival date & time: 04/20/16  1432     History   Chief Complaint Chief Complaint  Patient presents with  . Nausea    HPI Nicole [name] is a 55 y.o. female presenting to the ER after having near-syncope. Patient states she was out at [employer] and having a meeting when all of a sudden she felt lightheaded, nauseated, and abdominal pain. She states she's been having intermittent abdominal pain over the past 2 months. Had one episode of vomiting. She was told that she was diaphoretic and pale. Towards the end of it she started having a little chest tightness and trouble breathing. All of the symptoms have now resolved by the time she drops off to the ER. There is similar to the episode that happened a couple weeks ago. She states she has a history of low blood pressure in the past but no other medications or medical problem per her knowledge. No current chest pain or short of breath. Does not currently feel dizzy or lightheaded. No recent urinary symptoms. Patient states that she is postmenopausal but a couple months ago started having intermittent vaginal bleeding. None in the last few days. No current abdominal pain. She is concerned because this is what it felt like when her blood pressure was low in the past.  HPI  Past Medical History:  Diagnosis Date  . Anemia   . Depression    bipolar  . GERD (gastroesophageal reflux disease)   . History of migraine headaches    headaches reported as migraines  . Hypotension   . Obesity   . Palpitations    negative cardiac w/u per Dr. Verl Blalock 2/09  . Perimenopause   . Reflux esophagitis   . Sleep apnea    No CPAP    Patient Active Problem List   Diagnosis Date Noted  . Dysuria 03/12/2016  . OSA (obstructive sleep apnea) 12/27/2015  . Upper respiratory infection 12/27/2015  . Chronic headaches 12/27/2015  . Post-menopausal bleeding 12/27/2015  . Hyperpigmentation of skin 12/27/2015  .  Bipolar disorder ([place]) 04/14/2007  . Normocytic anemia 10/31/2005    Past Surgical History:  Procedure Laterality Date  . ECTOPIC PREGNANCY SURGERY    . EXPLORATORY LAPAROTOMY  2000   x2 (Dr. Kendall Flack)  . HERPES SIMPLEX VIRUS DFA    . TUBAL LIGATION  1987    OB History    No data available       Home Medications    Prior to Admission medications   Medication Sig Start Date End Date Taking? Authorizing Provider  acetaminophen-codeine (TYLENOL #3) 300-30 MG tablet Take 1 tablet by mouth every 6 (six) hours as needed for moderate pain or severe pain.  04/19/16  Yes Historical Provider, MD  ibuprofen (ADVIL,MOTRIN) 200 MG tablet Take 400 mg by mouth every 6 (six) hours as needed for headache or moderate pain.    Yes Historical Provider, MD  penicillin v potassium (VEETID) 500 MG tablet Take 500 mg by mouth daily. 04/19/16  Yes Historical Provider, MD  azithromycin (ZITHROMAX) 250 MG tablet Take 1 tablet (250 mg total) by mouth daily. Take first 2 tablets together, then 1 every day until finished. Patient not taking: Reported on 01/14/2016 12/30/15   Bjorn Pippin, PA-C  benzonatate (TESSALON) 100 MG capsule Take 1 capsule (100 mg total) by mouth 3 (three) times daily as needed for cough. Patient not taking: Reported on 01/14/2016 12/25/15   Raquel Sarna  West, PA-C  cetirizine (ZYRTEC) 10 MG tablet Take 1 tablet (10 mg total) by mouth daily. Patient not taking: Reported on 01/14/2016 12/25/15   Clayton Bibles, PA-C  fluticasone [facility]) 50 MCG/ACT nasal spray Place 1 spray into both nostrils daily. Patient not taking: Reported on 01/14/2016 12/25/15   Clayton Bibles, PA-C  guaiFENesin (MUCINEX) 600 MG 12 hr tablet Take 1 tablet (600 mg total) by mouth 2 (two) times daily. Patient not taking: Reported on 01/14/2016 12/27/15   Juliet Rude, MD  meclizine (ANTIVERT) 25 MG tablet Take 1 tablet (25 mg total) by mouth 3 (three) times daily as needed for dizziness or nausea. Patient not taking: Reported on  04/20/2016 01/14/16   Dorie Rank, MD    Family History Family History  Problem Relation Age of Onset  . Hypertension Mother   . Diabetes Mother   . Heart disease Mother   . Thyroid disease Mother   . Heart attack Mother   . Glaucoma Mother   . Pancreatic cancer Other     family history  . Heart attack Maternal Grandmother   . Heart attack Maternal Grandfather   . Breast cancer Maternal Aunt   . Pancreatic cancer Maternal Uncle   . Diabetes Sister   . Hypertension Sister   . Colon cancer Neg Hx     Social History Social History  Substance Use Topics  . Smoking status: Current Some Day Smoker    Years: 36.00    Types: Cigarettes    Last attempt to quit: 07/20/2011  . Smokeless tobacco: Never Used  . Alcohol use 0.0 oz/week     Comment: Rare glass of wine     Allergies   Other   Review of Systems Review of Systems  Constitutional: Negative for fever.  Respiratory: Positive for chest tightness and shortness of breath.   Cardiovascular: Negative for chest pain.  Gastrointestinal: Positive for abdominal pain, nausea and vomiting. Negative for diarrhea.  Genitourinary: Negative for dysuria.  Neurological: Positive for light-headedness. Negative for syncope and headaches.  All other systems reviewed and are negative.    Physical Exam Updated Vital Signs BP 117/75 (BP Location: Left Arm)   Pulse 78   Temp 97.9 F (36.6 C) (Oral)   Resp 20   LMP 02/17/2011   SpO2 98%   Physical Exam  Constitutional: She is oriented to person, place, and time. She appears well-developed and well-nourished. No distress.  HENT:  Head: Normocephalic and atraumatic.  Right Ear: External ear normal.  Left Ear: External ear normal.  Nose: Nose normal.  Mouth/Throat: Oropharynx is clear and moist.  Eyes: Right eye exhibits no discharge. Left eye exhibits no discharge.  Neck: Neck supple.  Cardiovascular: Normal rate, regular rhythm and normal heart sounds.   No murmur  heard. Pulmonary/Chest: Effort normal and breath sounds normal.  Abdominal: Soft. She exhibits no distension. There is no tenderness.  Neurological: She is alert and oriented to person, place, and time.  Skin: Skin is warm and dry. She is not diaphoretic.  Nursing note and vitals reviewed.    ED Treatments / Results  Labs (all labs ordered are listed, but only abnormal results are displayed) Labs Reviewed  CBC - Abnormal; Notable for the following:       Result Value   RBC 3.80 (*)    Hemoglobin 11.6 (*)    HCT 35.4 (*)    All other components within normal limits  COMPREHENSIVE METABOLIC PANEL  LIPASE, BLOOD  TROPONIN I  D-DIMER, QUANTITATIVE (NOT AT [facility])  CBC WITH DIFFERENTIAL/PLATELET    EKG  EKG Interpretation  Date/Time:  Friday April 20 2016 15:24:01 EDT Ventricular Rate:  67 PR Interval:    QRS Duration: 76 QT Interval:  388 QTC Calculation: 410 R Axis:   59 Text Interpretation:  Sinus rhythm Low voltage, precordial leads Baseline wander in lead(s) V5 no significant change since May 2017 Confirmed by Regenia Skeeter MD, [place] [phone]) on 04/20/2016 3:28:25 PM Also confirmed by Regenia Skeeter MD, Madyx Delfin [phone]), editor Yehuda Mao [phone])  on 04/20/2016 3:33:15 PM       Radiology Dg Chest 2 View  Result Date: 04/20/2016 CLINICAL DATA:  Near syncope, history hypotension, smoking, asthma EXAM: CHEST  2 VIEW COMPARISON:  12/25/2015 FINDINGS: Normal heart size, mediastinal contours, and pulmonary vascularity. Lungs clear. No pleural effusion or pneumothorax. Bones unremarkable. IMPRESSION: No acute abnormalities. Electronically Signed   By: Lavonia Dana M.D.   On: 04/20/2016 16:12    Procedures Procedures (including critical care time)  Medications Ordered in ED Medications - No data to display   Initial Impression / Assessment and Plan / ED Course  I have reviewed the triage vital signs and the nursing notes.  Pertinent labs & imaging results that were available during  my care of the patient were reviewed by me and considered in my medical decision making (see chart for details).  Clinical Course  Comment By Time  Exam is benign currently. Sounds like vaso-vagal. Will check ECG, labs. Will check troponin/ddimer given transient dyspnea/chest tightness but these sounded mild and started after the lightheadedness. Suspicion for ACS/PE is low. No current abd pain or reproducible tenderness. Sherwood Gambler, MD 09/01 [phone]  Nurse could not get IV. Patient refuses more IV sticks. Does not appear overtly dehydrated or otherwise in need of IV at this time. Sherwood Gambler, MD 09/01 [phone]    Patient continues to feel well. Declines to give urine, states she doesn't have a uti. Unclear why she had syncope. My suspicion for MI, PE or arrhythmia is low. Discussed delta troponin given atypical pain but she would like to be discharged now. F/u with PCP. Discussed return precautions.   Final Clinical Impressions(s) / ED Diagnoses   Final diagnoses:  Near syncope    New Prescriptions Discharge Medication List as of 04/20/2016  6:45 PM       Sherwood Gambler, MD 04/20/16 [id number]    Sherwood Gambler, MD 04/21/16 [id number]

## 2016-05-02 NOTE — Congregational Nurse Program (Signed)
Congregational Nurse Program Note  Date of Encounter: 04/11/2016  Past Medical History: Past Medical History:  Diagnosis Date  . Anemia   . Depression    bipolar  . GERD (gastroesophageal reflux disease)   . History of migraine headaches    headaches reported as migraines  . Hypotension   . Obesity   . Palpitations    negative cardiac w/u per Dr. Verl Blalock 2/09  . Perimenopause   . Reflux esophagitis   . Sleep apnea    No CPAP    Encounter Details:     CNP Questionnaire - 05/02/16 1841      Patient Demographics   Is this a new or existing patient? New   Patient is considered a/an Not Applicable   Race African-American/Black     Patient Assistance   Location of Patient Assistance Not Applicable   Patient's financial/insurance status Self-Pay   Uninsured Patient Yes   Interventions Counseled to make appt. with provider   Patient referred to apply for the following financial assistance Not Applicable   Food insecurities addressed Not Applicable   Transportation assistance No   Assistance securing medications No   Educational health offerings Chronic disease     Encounter Details   Primary purpose of visit Acute Illness/Condition Visit;Spiritual Care/Support Visit;Education/Health Concerns   Was an Emergency Department visit averted? Not Applicable   Does patient have a medical provider? Yes   Patient referred to Follow up with established PCP   Was a mental health screening completed? (GAINS tool) No   Does patient have dental issues? No   Does patient have vision issues? No   Does your patient have an abnormal blood pressure today? No   Since previous encounter, have you referred patient for abnormal blood pressure that resulted in a new diagnosis or medication change? No   Does your patient have an abnormal blood glucose today? No   Since previous encounter, have you referred patient for abnormal blood glucose that resulted in a new diagnosis or medication change? No    Was there a life-saving intervention made? No    Brief  viist  As client in just  Wanted her  Blood  Pressure checked  ,stated she  Thought it  May  Be high.  Has a PCP  Was seen in ED she  States on Saturday  States  Blood  Pressure was 80/55???  Today's  Pressure was 106/69  Pulse 88. States her blood  Pressure runs low. Client is seen by  Dr. Ronny Bacon at the [facility] clinic.   Client encouraged to follow up with her PCP  . Thanked nurse. Walks  Every day  Weight  Has gone from 220 lbs to 185 lbs. Her goal is  160 lbs.

## 2016-05-03 DIAGNOSIS — N9489 Other specified conditions associated with female genital organs and menstrual cycle: Secondary | ICD-10-CM | POA: Diagnosis not present

## 2016-05-04 DIAGNOSIS — H2510 Age-related nuclear cataract, unspecified eye: Secondary | ICD-10-CM | POA: Diagnosis not present

## 2016-05-04 DIAGNOSIS — H11153 Pinguecula, bilateral: Secondary | ICD-10-CM | POA: Diagnosis not present

## 2016-05-04 DIAGNOSIS — H11423 Conjunctival edema, bilateral: Secondary | ICD-10-CM | POA: Diagnosis not present

## 2016-05-04 DIAGNOSIS — H25019 Cortical age-related cataract, unspecified eye: Secondary | ICD-10-CM | POA: Diagnosis not present

## 2016-05-04 DIAGNOSIS — H18413 Arcus senilis, bilateral: Secondary | ICD-10-CM | POA: Diagnosis not present

## 2016-05-04 DIAGNOSIS — S0501XA Injury of conjunctiva and corneal abrasion without foreign body, right eye, initial encounter: Secondary | ICD-10-CM | POA: Diagnosis not present

## 2016-05-11 ENCOUNTER — Other Ambulatory Visit: Payer: Commercial Managed Care - HMO | Admitting: Obstetrics & Gynecology

## 2016-06-04 ENCOUNTER — Ambulatory Visit: Payer: Commercial Managed Care - HMO

## 2016-06-06 ENCOUNTER — Ambulatory Visit: Payer: Commercial Managed Care - HMO

## 2016-06-07 ENCOUNTER — Encounter: Payer: Self-pay | Admitting: Internal Medicine

## 2016-06-07 ENCOUNTER — Ambulatory Visit: Payer: Commercial Managed Care - HMO

## 2016-06-20 ENCOUNTER — Ambulatory Visit: Payer: Commercial Managed Care - HMO

## 2016-06-22 ENCOUNTER — Ambulatory Visit: Payer: Commercial Managed Care - HMO

## 2016-07-05 ENCOUNTER — Other Ambulatory Visit: Payer: Commercial Managed Care - HMO | Admitting: Obstetrics & Gynecology

## 2016-07-09 ENCOUNTER — Ambulatory Visit (INDEPENDENT_AMBULATORY_CARE_PROVIDER_SITE_OTHER): Payer: Commercial Managed Care - HMO | Admitting: Obstetrics & Gynecology

## 2016-07-09 ENCOUNTER — Other Ambulatory Visit (HOSPITAL_COMMUNITY)
Admission: RE | Admit: 2016-07-09 | Discharge: 2016-07-09 | Disposition: A | Discharge status: Home / Self Care | Payer: Commercial Managed Care - HMO | Source: Ambulatory Visit | Attending: Obstetrics & Gynecology | Admitting: Obstetrics & Gynecology

## 2016-07-09 ENCOUNTER — Encounter: Payer: Self-pay | Admitting: Obstetrics & Gynecology

## 2016-07-09 VITALS — BP 114/65 | HR 69 | Ht 65.0 in | Wt 173.0 lb

## 2016-07-09 DIAGNOSIS — N950 Postmenopausal bleeding: Secondary | ICD-10-CM | POA: Diagnosis not present

## 2016-07-09 DIAGNOSIS — Z0000 Encounter for general adult medical examination without abnormal findings: Secondary | ICD-10-CM

## 2016-07-09 DIAGNOSIS — N840 Polyp of corpus uteri: Secondary | ICD-10-CM | POA: Diagnosis not present

## 2016-07-09 NOTE — Progress Notes (Signed)
   Subjective:    Patient ID: Nicole [name], female    DOB: 04-07-1961, 55 y.o.   MRN: [id number]  HPI  55 yo S AA P3 here for an EMBX. She has had irregular PMB since 7/17. She went through menopause about 3 1/2 years ago.   Review of Systems She has been abstinent for about 3 years.    Objective:   Physical Exam [id number] Breathing, conversing, and ambulating normally Abd- benign  UPT negative, consent signed, time out done Cervix prepped with betadine and grasped with a single tooth tenaculum Her cervix looked like she may have had a LEEP in the past. Uterus sounded to 8 cm Pipelle used for 2 passes with a small amount of tissue obtained. A gritty sensation was appreciated. She tolerated the procedure well.      Assessment & Plan:  PMB- await pathology Check gyn u/s Preventative care- mammogram scheduled She declines a flu vaccine 800 mg IBU given post procedure

## 2016-07-18 ENCOUNTER — Ambulatory Visit (HOSPITAL_COMMUNITY): Payer: Commercial Managed Care - HMO

## 2016-07-20 ENCOUNTER — Encounter: Payer: Self-pay | Admitting: Internal Medicine

## 2016-07-20 ENCOUNTER — Ambulatory Visit (INDEPENDENT_AMBULATORY_CARE_PROVIDER_SITE_OTHER): Payer: Commercial Managed Care - HMO | Admitting: Internal Medicine

## 2016-07-20 ENCOUNTER — Encounter (INDEPENDENT_AMBULATORY_CARE_PROVIDER_SITE_OTHER): Payer: Self-pay

## 2016-07-20 VITALS — BP 102/69 | HR 82 | Temp 97.6°F | Ht 65.0 in | Wt 170.8 lb

## 2016-07-20 DIAGNOSIS — M25512 Pain in left shoulder: Secondary | ICD-10-CM | POA: Diagnosis not present

## 2016-07-20 DIAGNOSIS — J029 Acute pharyngitis, unspecified: Secondary | ICD-10-CM | POA: Insufficient documentation

## 2016-07-20 DIAGNOSIS — J312 Chronic pharyngitis: Secondary | ICD-10-CM | POA: Diagnosis not present

## 2016-07-20 DIAGNOSIS — M7989 Other specified soft tissue disorders: Secondary | ICD-10-CM

## 2016-07-20 DIAGNOSIS — R070 Pain in throat: Secondary | ICD-10-CM

## 2016-07-20 DIAGNOSIS — F17210 Nicotine dependence, cigarettes, uncomplicated: Secondary | ICD-10-CM | POA: Diagnosis not present

## 2016-07-20 NOTE — Progress Notes (Signed)
   CC: Left sternoclavicular pain and swelling  HPI:  [name] is a 54 y.o. female with PMH as listed below who presents with complaint of left sternoclavicular pain and swelling.  Left sternoclavicular inflammation: Patient reports 1 week history of pain and swelling at her left sternoclavicular joint. Pain feels like a tight sensation that is constant, worse when raising her left arm above her head. It feels like pressure on her bone. Area is tender to touch.  She denies any injury to the area or change in activity with heavy lifting or overuse of her left upper extremity. She denies any fevers or chills. She has not noticed any similar swelling in other joints. She reports chronic sore throat with pain, but does not have dysphagia, odynophagia, or trouble swallowing solids or liquids.  She has a history of thyroid nodule with U/S soft tissue head and neck in January 2015 showing small thyroid nodules not meeting criteria for biopsy. She reports chronic heat and cold intolerance and intentional weight loss this year. TSH on 12/27/2015 was normal at 0.574.   Chronic throat pain: She reports chronic sore throat with pain, but does not have dysphagia, odynophagia, or trouble swallowing solids or liquids. She has seen Dr. Erik [name] for this in the past and is requesting referral back to ENT.    Past Medical History:  Diagnosis Date  . Anemia   . Depression    bipolar  . GERD (gastroesophageal reflux disease)   . History of migraine headaches    headaches reported as migraines  . Hypotension   . Obesity   . Palpitations    negative cardiac w/u per Dr. Verl [name] 2/09  . Perimenopause   . Reflux esophagitis   . Sleep apnea    No CPAP    Review of Systems:   Review of Systems  Constitutional: Negative for chills, diaphoresis and fever.  Respiratory: Negative for shortness of breath.   Cardiovascular: Negative for chest pain.  Gastrointestinal: Negative for nausea and vomiting.    Musculoskeletal:       Left sternoclavicular pain and swelling  Neurological: Negative for loss of consciousness.     Physical Exam:  Vitals:   07/20/16 0959  BP: 102/69  Pulse: 82  Temp: 97.6 F (36.4 C)  TempSrc: Oral  SpO2: 99%  Weight: 170 lb 12.8 oz (77.5 kg)  Height: 5\' 5"  (1.651 m)   Physical Exam  Neck: Neck supple. No tracheal deviation present. No thyromegaly present.  Thyroid lobes palpable, symmetric, without obvious nodule with palpation. Slightly tender to touch.  Cardiovascular: Normal rate and regular rhythm.   Pulmonary/Chest: Effort normal. No respiratory distress. She has no wheezes.  Musculoskeletal:  Left sternoclavicular joint with slight swelling over joint-space with tenderness to palpation. No increased warmth to touch, erythema, or wound. Pain worse with elevation of LUE but ROM intact. Right sternoclavicular joint normal.  Skin: Skin is warm. No rash noted. No erythema.   Bedside U/S: Bedside U/S performed of thyroid and left and right sternoclavicular joints by Dr. Evette [name]. Thyroid appears normal in size without extension of inferior borders below the sternum or clavicles. Left sternoclavicular joint space is inflamed without significant effusion. Right sternoclavicular joint space without inflammation or effusion.  Assessment & Plan:   See Encounters Tab for problem based charting.  Patient seen with Dr. Evette [name]

## 2016-07-20 NOTE — Patient Instructions (Addendum)
It was a pleasure to meet you Nicole [name].  Your pain and swelling is likely from inflammation where your collar bone meets your breastplate (sternum). You can try over the counter Ibuprofen or Advil to help with the pain and inflammation.  We did an ultrasound of your thyroid, which looked okay.  I will send a referral for you to see Dr. Erik Obey again.  Please let us know if your symptoms worsen or do not improve.   Ibuprofen tablets and capsules What is this medicine? IBUPROFEN (eye BYOO proe fen) is a non-steroidal anti-inflammatory drug (NSAID). It is used for dental pain, fever, headaches or migraines, osteoarthritis, rheumatoid arthritis, or painful monthly periods. It can also relieve minor aches and pains caused by a cold, flu, or sore throat. This medicine may be used for other purposes; ask your health care provider or pharmacist if you have questions. COMMON BRAND NAME(S): Advil, Advil Junior Strength, Advil Migraine, Genpril, Ibren, IBU, Midol, Midol Cramps and Body Aches, Motrin, Motrin IB, Motrin Junior Strength, Motrin Migraine Pain, Samson-8, Toxicology Saliva Collection What should I tell my health care provider before I take this medicine? They need to know if you have any of these conditions: -asthma -cigarette smoker -drink more than 3 alcohol containing drinks a day -heart disease or circulation problems such as heart failure or leg edema (fluid retention) -high blood pressure -kidney disease -liver disease -stomach bleeding or ulcers -an unusual or allergic reaction to ibuprofen, aspirin, other NSAIDS, other medicines, foods, dyes, or preservatives -pregnant or trying to get pregnant -breast-feeding How should I use this medicine? Take this medicine by mouth with a glass of water. Follow the directions on the prescription label. Take this medicine with food if your stomach gets upset. Try to not lie down for at least 10 minutes after you take the medicine. Take your  medicine at regular intervals. Do not take your medicine more often than directed. A special MedGuide will be given to you by the pharmacist with each prescription and refill. Be sure to read this information carefully each time. Talk to your pediatrician regarding the use of this medicine in children. Special care may be needed. Overdosage: If you think you have taken too much of this medicine contact a poison control center or emergency room at once. NOTE: This medicine is only for you. Do not share this medicine with others. What if I miss a dose? If you miss a dose, take it as soon as you can. If it is almost time for your next dose, take only that dose. Do not take double or extra doses. What may interact with this medicine? Do not take this medicine with any of the following medications: -cidofovir -ketorolac -methotrexate -pemetrexed This medicine may also interact with the following medications: -alcohol -aspirin -diuretics -lithium -other drugs for inflammation like prednisone -warfarin This list may not describe all possible interactions. Give your health care provider a list of all the medicines, herbs, non-prescription drugs, or dietary supplements you use. Also tell them if you smoke, drink alcohol, or use illegal drugs. Some items may interact with your medicine. What should I watch for while using this medicine? Tell your doctor or healthcare professional if your symptoms do not start to get better or if they get worse. This medicine does not prevent heart attack or stroke. In fact, this medicine may increase the chance of a heart attack or stroke. The chance may increase with longer use of this medicine and in people who  have heart disease. If you take aspirin to prevent heart attack or stroke, talk with your doctor or health care professional. Do not take other medicines that contain aspirin, ibuprofen, or naproxen with this medicine. Side effects such as stomach upset,  nausea, or ulcers may be more likely to occur. Many medicines available without a prescription should not be taken with this medicine. This medicine can cause ulcers and bleeding in the stomach and intestines at any time during treatment. Ulcers and bleeding can happen without warning symptoms and can cause death. To reduce your risk, do not smoke cigarettes or drink alcohol while you are taking this medicine. You may get drowsy or dizzy. Do not drive, use machinery, or do anything that needs mental alertness until you know how this medicine affects you. Do not stand or sit up quickly, especially if you are an older patient. This reduces the risk of dizzy or fainting spells. This medicine can cause you to bleed more easily. Try to avoid damage to your teeth and gums when you brush or floss your teeth. This medicine may be used to treat migraines. If you take migraine medicines for 10 or more days a month, your migraines may get worse. Keep a diary of headache days and medicine use. Contact your healthcare professional if your migraine attacks occur more frequently. What side effects may I notice from receiving this medicine? Side effects that you should report to your doctor or health care professional as soon as possible: -allergic reactions like skin rash, itching or hives, swelling of the face, lips, or tongue -severe stomach pain -signs and symptoms of bleeding such as bloody or black, tarry stools; red or dark-brown urine; spitting up blood or brown material that looks like coffee grounds; red spots on the skin; unusual bruising or bleeding from the eye, gums, or nose -signs and symptoms of a blood clot such as changes in vision; chest pain; severe, sudden headache; trouble speaking; sudden numbness or weakness of the face, arm, or leg -unexplained weight gain or swelling -unusually weak or tired -yellowing of eyes or skin Side effects that usually do not require medical attention (report to your  doctor or health care professional if they continue or are bothersome): -bruising -diarrhea -dizziness, drowsiness -headache -nausea, vomiting This list may not describe all possible side effects. Call your doctor for medical advice about side effects. You may report side effects to FDA at 1-800-FDA-1088. Where should I keep my medicine? Keep out of the reach of children. Store at room temperature between 15 and 30 degrees C (59 and 86 degrees F). Keep container tightly closed. Throw away any unused medicine after the expiration date. NOTE: This sheet is a summary. It may not cover all possible information. If you have questions about this medicine, talk to your doctor, pharmacist, or health care provider.  2017 Elsevier/Gold Standard (2013-04-07 10:48:02)

## 2016-07-20 NOTE — Assessment & Plan Note (Signed)
She reports chronic sore throat with pain, but does not have dysphagia, odynophagia, or trouble swallowing solids or liquids. Symptoms resumed about 1 year ago when she started smoking again. She smokes about 4 cigarettes daily. She has seen Dr. Erik Obey for this in the past and is requesting referral back to ENT for follow up.  Her pain is non-specific without any alarm symptoms. She is advised to quit smoking. Will see if she can get back in to see Dr. Erik Obey, no prior ENT office notes available that I can find. -Advised patient on smoking cessation -f/u with Dr. Erik Obey

## 2016-07-20 NOTE — Assessment & Plan Note (Signed)
Patient reports 1 week history of pain and swelling at her left sternoclavicular joint. Pain feels like a tight sensation that is constant, worse when raising her left arm above her head. It feels like pressure on her bone. Area is tender to touch.  She denies any injury to the area or change in activity with heavy lifting or overuse of her left upper extremity. She denies any fevers or chills. She has not noticed any similar swelling in other joints. She reports chronic sore throat with pain, but does not have dysphagia, odynophagia, or trouble swallowing solids or liquids.  She has a history of thyroid nodule with U/S soft tissue head and neck in January 2015 showing small thyroid nodules not meeting criteria for biopsy. She reports chronic heat and cold intolerance and intentional weight loss this year. TSH on 12/27/2015 was normal at 0.574.  Bedside U/S: Bedside U/S performed of thyroid and left and right sternoclavicular joints by Dr. Evette Doffing. Thyroid appears normal in size without extension of inferior borders below the sternum or clavicles. Left sternoclavicular joint space is inflamed without significant effusion. Right sternoclavicular joint space without inflammation or effusion.  Symptoms and exam consistent with inflammation of left sternoclavicular joint space without involvement of the thyroid, likely some component of costochondritis. No significant effusion to warrant aspiration today. No systemic signs or symptoms to suggest septic joint.Will treat conservatively with anti-inflammatories and advise patient to call us if symptoms do not improve or worsen over the next 2-3 weeks. -OTC NSAIDs -Call if symptoms worsen or do not improve in the next few weeks

## 2016-07-23 ENCOUNTER — Ambulatory Visit (HOSPITAL_COMMUNITY): Payer: Commercial Managed Care - HMO | Attending: Obstetrics & Gynecology

## 2016-07-23 NOTE — Progress Notes (Signed)
Internal Medicine Clinic Attending  I saw and evaluated the patient.  I personally confirmed the key portions of the history and exam documented by Dr. Zada Finders and I reviewed pertinent patient test results.  The assessment, diagnosis, and plan were formulated together and I agree with the documentation in the resident's note.  Point tenderness over the left sternoclavicular joint. No effusion seen on POCUS. Probably MSK strain, likely self limited, I agree with therapeutic trial with NSAIDs

## 2016-07-31 ENCOUNTER — Ambulatory Visit: Payer: Commercial Managed Care - HMO

## 2016-08-02 ENCOUNTER — Ambulatory Visit: Payer: Commercial Managed Care - HMO | Admitting: Obstetrics & Gynecology

## 2016-08-14 ENCOUNTER — Ambulatory Visit: Payer: Commercial Managed Care - HMO | Admitting: Internal Medicine

## 2016-08-24 ENCOUNTER — Ambulatory Visit (HOSPITAL_COMMUNITY): Admission: RE | Admit: 2016-08-24 | Payer: Commercial Managed Care - HMO | Source: Ambulatory Visit

## 2016-08-26 ENCOUNTER — Encounter (HOSPITAL_COMMUNITY): Payer: Self-pay | Admitting: Emergency Medicine

## 2016-08-26 ENCOUNTER — Emergency Department (HOSPITAL_COMMUNITY)
Admission: EM | Admit: 2016-08-26 | Discharge: 2016-08-26 | Disposition: A | Discharge status: Home / Self Care | Payer: Commercial Managed Care - HMO | Attending: Emergency Medicine | Admitting: Emergency Medicine

## 2016-08-26 ENCOUNTER — Emergency Department (HOSPITAL_COMMUNITY): Payer: Commercial Managed Care - HMO

## 2016-08-26 DIAGNOSIS — F17210 Nicotine dependence, cigarettes, uncomplicated: Secondary | ICD-10-CM | POA: Diagnosis not present

## 2016-08-26 DIAGNOSIS — R0981 Nasal congestion: Secondary | ICD-10-CM | POA: Insufficient documentation

## 2016-08-26 DIAGNOSIS — R69 Illness, unspecified: Secondary | ICD-10-CM

## 2016-08-26 DIAGNOSIS — R079 Chest pain, unspecified: Secondary | ICD-10-CM | POA: Diagnosis not present

## 2016-08-26 DIAGNOSIS — J111 Influenza due to unidentified influenza virus with other respiratory manifestations: Secondary | ICD-10-CM

## 2016-08-26 LAB — I-STAT TROPONIN, ED: Troponin i, poc: 0 ng/mL (ref 0.00–0.08)

## 2016-08-26 LAB — CBC
HEMATOCRIT: 38.2 % (ref 36.0–46.0)
Hemoglobin: 12.8 g/dL (ref 12.0–15.0)
MCH: 30.4 pg (ref 26.0–34.0)
MCHC: 33.5 g/dL (ref 30.0–36.0)
MCV: 90.7 fL (ref 78.0–100.0)
Platelets: 220 10*3/uL (ref 150–400)
RBC: 4.21 MIL/uL (ref 3.87–5.11)
RDW: 13.6 % (ref 11.5–15.5)
WBC: 4.2 10*3/uL (ref 4.0–10.5)

## 2016-08-26 LAB — BASIC METABOLIC PANEL
Anion gap: 9 (ref 5–15)
BUN: 6 mg/dL (ref 6–20)
CO2: 26 mmol/L (ref 22–32)
Calcium: 9.4 mg/dL (ref 8.9–10.3)
Chloride: 104 mmol/L (ref 101–111)
Creatinine, Ser: 0.79 mg/dL (ref 0.44–1.00)
GFR calc Af Amer: >60 mL/min (ref >60)
GFR calc non Af Amer: >60 mL/min (ref >60)
GLUCOSE: 86 mg/dL (ref 65–99)
POTASSIUM: 3.8 mmol/L (ref 3.5–5.1)
Sodium: 139 mmol/L (ref 135–145)

## 2016-08-26 MED ORDER — AEROCHAMBER PLUS W/MASK MISC
1.0000 | Freq: Once | Status: AC
  Administered 2016-08-26: 1
  Filled 2016-08-26: qty 1

## 2016-08-26 MED ORDER — ACETAMINOPHEN 325 MG PO TABS
650.0000 mg | ORAL_TABLET | Freq: Once | ORAL | Status: AC
  Administered 2016-08-26: 650 mg via ORAL
  Filled 2016-08-26: qty 2

## 2016-08-26 MED ORDER — ALBUTEROL SULFATE HFA 108 (90 BASE) MCG/ACT IN AERS
2.0000 | INHALATION_SPRAY | RESPIRATORY_TRACT | Status: DC | PRN
  Administered 2016-08-26: 2 via RESPIRATORY_TRACT
  Filled 2016-08-26: qty 6.7

## 2016-08-26 NOTE — Discharge Instructions (Signed)
Use your inhaler 2 puffs every 4 hours as needed for cough or shortness of breath. Take Tylenol as directed every 4 hours while awake for aches or for temperature higher than 100.4. See your physician at the outpatient clinic if you're not feeling better in a week. Return if concern for any reason. Ask your primary care physician to help you to stop smoking

## 2016-08-26 NOTE — ED Triage Notes (Signed)
Pt. Stated, I've had congestion and body aches with my chest hurting for a week.

## 2016-08-26 NOTE — ED Provider Notes (Signed)
[facility] DEPT Provider Note   CSN: [id number] Arrival date & time: 08/26/16  0745     History   Chief Complaint Chief Complaint  Patient presents with  . Nasal Congestion  . Chest Pain  . Generalized Body Aches    HPI Nicole [name] is a 56 y.o. female.Complains of nasal congestion diffuse body aches cough productive of yellow sputum for one week. Associated symptoms include subjective fever, shortness of breath. No nausea or vomiting. No headaches. No other associated symptoms. No treatment prior to coming here nothing makes symptoms better or worse  HPI  Past Medical History:  Diagnosis Date  . Anemia   . Depression    bipolar  . GERD (gastroesophageal reflux disease)   . History of migraine headaches    headaches reported as migraines  . Hypotension   . Obesity   . Palpitations    negative cardiac w/u per Dr. Verl Blalock 2/09  . Perimenopause   . Reflux esophagitis   . Sleep apnea    No CPAP    Patient Active Problem List   Diagnosis Date Noted  . Pain of left sternoclavicular joint 07/20/2016  . Throat pain 07/20/2016  . OSA (obstructive sleep apnea) 12/27/2015  . Chronic headaches 12/27/2015  . Post-menopausal bleeding 12/27/2015  . Bipolar disorder ([place]) 04/14/2007  . Normocytic anemia 10/31/2005    Past Surgical History:  Procedure Laterality Date  . ECTOPIC PREGNANCY SURGERY    . EXPLORATORY LAPAROTOMY  2000   x2 (Dr. Kendall Flack)  . HERPES SIMPLEX VIRUS DFA    . TUBAL LIGATION  1987    OB History    Gravida Para Term Preterm AB Living   4 3 3  0 1 3   SAB TAB Ectopic Multiple Live Births   0 0 1 0 3       Home Medications    Prior to Admission medications   Medication Sig Start Date End Date Taking? Authorizing Provider  acetaminophen-codeine (TYLENOL #3) 300-30 MG tablet Take 1 tablet by mouth every 6 (six) hours as needed for moderate pain or severe pain.  04/19/16   Historical Provider, MD    Family History Family History  Problem  Relation Age of Onset  . Hypertension Mother   . Diabetes Mother   . Heart disease Mother   . Thyroid disease Mother   . Heart attack Mother   . Glaucoma Mother   . Pancreatic cancer Other     family history  . Heart attack Maternal Grandmother   . Heart attack Maternal Grandfather   . Breast cancer Maternal Aunt   . Pancreatic cancer Maternal Uncle   . Diabetes Sister   . Hypertension Sister   . Colon cancer Neg Hx     Social History Social History  Substance Use Topics  . Smoking status: Current Some Day Smoker    Years: 36.00    Types: Cigarettes    Last attempt to quit: 07/20/2011  . Smokeless tobacco: Never Used  . Alcohol use 0.0 oz/week     Comment: Rare glass of wine     Allergies   Other   Review of Systems Review of Systems  Constitutional: Positive for fever.  HENT: Negative.   Respiratory: Positive for cough and shortness of breath.   Cardiovascular: Positive for chest pain.  Gastrointestinal: Negative.   Musculoskeletal: Positive for myalgias.  Skin: Negative.   Neurological: Negative.   Psychiatric/Behavioral: Negative.   All other systems reviewed and are negative.  Physical Exam Updated Vital Signs BP 113/76 (BP Location: Left Arm)   Pulse 86   Temp 98.7 F (37.1 C) (Oral)   Resp 16   Ht 5' 5.5" (1.664 m)   Wt 164 lb 9 oz (74.6 kg)   LMP 03/03/2016 (Within Weeks)   SpO2 97%   BMI 26.97 kg/m   Physical Exam  Constitutional: She appears well-developed and well-nourished.  HENT:  Head: Normocephalic and atraumatic.  Eyes: Conjunctivae are normal. Pupils are equal, round, and reactive to light.  Neck: Neck supple. No tracheal deviation present. No thyromegaly present.  Cardiovascular: Normal rate and regular rhythm.   No murmur heard. Pulmonary/Chest: Effort normal and breath sounds normal.  Abdominal: Soft. Bowel sounds are normal. She exhibits no distension. There is no tenderness.  Musculoskeletal: Normal range of motion. She  exhibits no edema or tenderness.  Neurological: She is alert. Coordination normal.  Skin: Skin is warm and dry. No rash noted.  Psychiatric: She has a normal mood and affect.  Nursing note and vitals reviewed.    ED Treatments / Results  Labs (all labs ordered are listed, but only abnormal results are displayed) Labs Reviewed  [place], ED    EKG  EKG Interpretation  Date/Time:  Sunday August 26 2016 08:12:16 EST Ventricular Rate:  88 PR Interval:  126 QRS Duration: 62 QT Interval:  334 QTC Calculation: 404 R Axis:   69 Text Interpretation:  Normal sinus rhythm Normal ECG No significant change since last tracing Confirmed by Winfred Leeds  MD, Caisley Baxendale [phone]) on 08/26/2016 9:04:38 AM      Results for orders placed or performed during the hospital encounter of [id number]  Basic metabolic panel  Result Value Ref Range   Sodium 139 135 - 145 mmol/L   Potassium 3.8 3.5 - 5.1 mmol/L   Chloride 104 101 - 111 mmol/L   CO2 26 22 - 32 mmol/L   Glucose, Bld 86 65 - 99 mg/dL   BUN 6 6 - 20 mg/dL   Creatinine, Ser 0.79 0.44 - 1.00 mg/dL   Calcium 9.4 8.9 - 10.3 mg/dL   GFR calc non Af Amer >60 >60 mL/min   GFR calc Af Amer >60 >60 mL/min   Anion gap 9 5 - 15  CBC  Result Value Ref Range   WBC 4.2 4.0 - 10.5 K/uL   RBC 4.21 3.87 - 5.11 MIL/uL   Hemoglobin 12.8 12.0 - 15.0 g/dL   HCT 38.2 36.0 - 46.0 %   MCV 90.7 78.0 - 100.0 fL   MCH 30.4 26.0 - 34.0 pg   MCHC 33.5 30.0 - 36.0 g/dL   RDW 13.6 11.5 - 15.5 %   Platelets 220 150 - 400 K/uL  I-stat troponin, ED  Result Value Ref Range   Troponin i, poc 0.00 0.00 - 0.08 ng/mL   Comment 3           Dg Chest 2 View  Result Date: 08/26/2016 CLINICAL DATA:  Sore throat with chest pain EXAM: CHEST  2 VIEW COMPARISON:  04/20/2016 FINDINGS: The heart size and mediastinal contours are within normal limits. Both lungs are clear. The visualized skeletal structures are unremarkable. Skin folds overlie the  chest bilaterally. Artifact over the right apex. IMPRESSION: No active cardiopulmonary disease. Electronically Signed   By: Jerilynn Mages.  Shick M.D.   On: 08/26/2016 08:50    Radiology Dg Chest 2 View  Result Date: 08/26/2016 CLINICAL DATA:  Sore throat with  chest pain EXAM: CHEST  2 VIEW COMPARISON:  04/20/2016 FINDINGS: The heart size and mediastinal contours are within normal limits. Both lungs are clear. The visualized skeletal structures are unremarkable. Skin folds overlie the chest bilaterally. Artifact over the right apex. IMPRESSION: No active cardiopulmonary disease. Electronically Signed   By: Jerilynn Mages.  Shick M.D.   On: 08/26/2016 08:50    Procedures Procedures (including critical care time)  Medications Ordered in ED Medications - No data to display Chest x-ray viewed by me  Initial Impression / Assessment and Plan / ED Course  I have reviewed the triage vital signs and the nursing notes.  Pertinent labs & imaging results that were available during my care of the patient were reviewed by me and considered in my medical decision making (see chart for details).  Clinical Course     Plan Tylenol. Albuterol HFA with spacer to go. I counseled patient for 5 minutes on smoking cessation. Follow-up with PMD if not feeling better in a week  Final Clinical Impressions(s) / ED Diagnoses  Diagnoses #1 influenza-like illness #2 tobacco abuse Final diagnoses:  None    New Prescriptions New Prescriptions   No medications on file     Orlie Dakin, MD 08/26/16 [phone]

## 2016-08-28 ENCOUNTER — Ambulatory Visit (HOSPITAL_COMMUNITY): Payer: Medicare HMO

## 2016-08-30 ENCOUNTER — Ambulatory Visit: Payer: Commercial Managed Care - HMO

## 2016-09-03 ENCOUNTER — Other Ambulatory Visit: Payer: Self-pay | Admitting: Internal Medicine

## 2016-09-05 ENCOUNTER — Ambulatory Visit: Payer: Commercial Managed Care - HMO | Admitting: Cardiology

## 2016-09-11 ENCOUNTER — Ambulatory Visit (HOSPITAL_COMMUNITY): Payer: Medicare HMO

## 2016-09-11 ENCOUNTER — Emergency Department (HOSPITAL_COMMUNITY): Payer: Medicare HMO

## 2016-09-11 ENCOUNTER — Encounter (HOSPITAL_COMMUNITY): Payer: Self-pay | Admitting: Emergency Medicine

## 2016-09-11 ENCOUNTER — Emergency Department (HOSPITAL_COMMUNITY)
Admission: EM | Admit: 2016-09-11 | Discharge: 2016-09-11 | Disposition: A | Discharge status: Home / Self Care | Payer: Medicare HMO | Attending: Emergency Medicine | Admitting: Emergency Medicine

## 2016-09-11 DIAGNOSIS — R197 Diarrhea, unspecified: Secondary | ICD-10-CM | POA: Diagnosis not present

## 2016-09-11 DIAGNOSIS — K529 Noninfective gastroenteritis and colitis, unspecified: Secondary | ICD-10-CM

## 2016-09-11 DIAGNOSIS — F17210 Nicotine dependence, cigarettes, uncomplicated: Secondary | ICD-10-CM | POA: Insufficient documentation

## 2016-09-11 DIAGNOSIS — R1084 Generalized abdominal pain: Secondary | ICD-10-CM | POA: Diagnosis not present

## 2016-09-11 DIAGNOSIS — R110 Nausea: Secondary | ICD-10-CM | POA: Diagnosis not present

## 2016-09-11 DIAGNOSIS — R935 Abnormal findings on diagnostic imaging of other abdominal regions, including retroperitoneum: Secondary | ICD-10-CM | POA: Diagnosis not present

## 2016-09-11 DIAGNOSIS — K591 Functional diarrhea: Secondary | ICD-10-CM | POA: Diagnosis not present

## 2016-09-11 DIAGNOSIS — R112 Nausea with vomiting, unspecified: Secondary | ICD-10-CM | POA: Diagnosis present

## 2016-09-11 DIAGNOSIS — R1110 Vomiting, unspecified: Secondary | ICD-10-CM | POA: Diagnosis not present

## 2016-09-11 LAB — URINALYSIS, ROUTINE W REFLEX MICROSCOPIC
BACTERIA UA: NONE SEEN
BILIRUBIN URINE: NEGATIVE
Glucose, UA: NEGATIVE mg/dL
KETONES UR: 5 mg/dL — AB
Leukocytes, UA: NEGATIVE
Nitrite: NEGATIVE
PH: 5 (ref 5.0–8.0)
PROTEIN: 30 mg/dL — AB
Specific Gravity, Urine: 1.03 (ref 1.005–1.030)

## 2016-09-11 LAB — COMPREHENSIVE METABOLIC PANEL
ALBUMIN: 4.3 g/dL (ref 3.5–5.0)
ALT: 22 U/L (ref 14–54)
ANION GAP: 6 (ref 5–15)
AST: 16 U/L (ref 15–41)
Alkaline Phosphatase: 57 U/L (ref 38–126)
BUN: 8 mg/dL (ref 6–20)
CHLORIDE: 105 mmol/L (ref 101–111)
CO2: 27 mmol/L (ref 22–32)
Calcium: 8.7 mg/dL — ABNORMAL LOW (ref 8.9–10.3)
Creatinine, Ser: 0.63 mg/dL (ref 0.44–1.00)
GFR calc non Af Amer: >60 mL/min (ref >60)
GLUCOSE: 95 mg/dL (ref 65–99)
Potassium: 3.6 mmol/L (ref 3.5–5.1)
SODIUM: 138 mmol/L (ref 135–145)
Total Bilirubin: 0.7 mg/dL (ref 0.3–1.2)
Total Protein: 6.8 g/dL (ref 6.5–8.1)

## 2016-09-11 LAB — CBC
HEMATOCRIT: 36.9 % (ref 36.0–46.0)
HEMOGLOBIN: 12.5 g/dL (ref 12.0–15.0)
MCH: 30.8 pg (ref 26.0–34.0)
MCHC: 33.9 g/dL (ref 30.0–36.0)
MCV: 90.9 fL (ref 78.0–100.0)
Platelets: 231 10*3/uL (ref 150–400)
RBC: 4.06 MIL/uL (ref 3.87–5.11)
RDW: 13.6 % (ref 11.5–15.5)
WBC: 4 10*3/uL (ref 4.0–10.5)

## 2016-09-11 LAB — LIPASE, BLOOD: LIPASE: 18 U/L (ref 11–51)

## 2016-09-11 MED ORDER — DICYCLOMINE HCL 20 MG PO TABS: 20.0000 mg | ORAL_TABLET | Freq: Three times a day (TID) | ORAL | 0 refills | Status: DC

## 2016-09-11 MED ORDER — ONDANSETRON HCL 4 MG/2ML IJ SOLN
4.0000 mg | Freq: Once | INTRAMUSCULAR | Status: AC
  Administered 2016-09-11: 4 mg via INTRAVENOUS
  Filled 2016-09-11: qty 2

## 2016-09-11 MED ORDER — IOPAMIDOL (ISOVUE-300) INJECTION 61%
100.0000 mL | Freq: Once | INTRAVENOUS | Status: AC | PRN
  Administered 2016-09-11: 100 mL via INTRAVENOUS

## 2016-09-11 MED ORDER — ONDANSETRON HCL 4 MG PO TABS: 4.0000 mg | ORAL_TABLET | Freq: Three times a day (TID) | ORAL | 0 refills | Status: DC | PRN

## 2016-09-11 MED ORDER — IOPAMIDOL (ISOVUE-300) INJECTION 61%
INTRAVENOUS | Status: AC
  Filled 2016-09-11: qty 100

## 2016-09-11 MED ORDER — SODIUM CHLORIDE 0.9 % IV BOLUS (SEPSIS)
1000.0000 mL | Freq: Once | INTRAVENOUS | Status: AC
  Administered 2016-09-11: 1000 mL via INTRAVENOUS

## 2016-09-11 NOTE — ED Triage Notes (Signed)
pe PTAr pt from home , NVD x 2 days. sts was Dx Flu last week . Sts had appointment at [facility] for abd [place]. Was told to not go. Alert and oriented x 4.

## 2016-09-11 NOTE — ED Provider Notes (Signed)
[facility] DEPT Provider Note   CSN: [id number] Arrival date & time: 09/11/16  1141     History   Chief Complaint Chief Complaint  Patient presents with  . Abdominal Pain  . Emesis    HPI Nicole [name] is a 56 y.o. female.  HPI  56 year old female with past medical history of bipolar disorder, chronic headaches, who presents with diffuse abdominal pain. Patient states that approximately 4 days ago, she went to the local shelter and ate a Fish dinner. Since then, she has had initially mild nausea followed by vomiting and now diarrhea. Over the last 2 days, she has had profuse, watery, nonbloody diarrhea. She endorses associated diffuse but primarily left lower quadrant aching, gnawing abdominal pain. Pain is worse with eating and palpation. Denies any alleviating factors. Denies any fevers mother she did feel chills on the day of onset.  Past Medical History:  Diagnosis Date  . Anemia   . Depression    bipolar  . GERD (gastroesophageal reflux disease)   . History of migraine headaches    headaches reported as migraines  . Hypotension   . Obesity   . Palpitations    negative cardiac w/u per Dr. Verl Blalock 2/09  . Perimenopause   . Reflux esophagitis   . Sleep apnea    No CPAP    Patient Active Problem List   Diagnosis Date Noted  . Pain of left sternoclavicular joint 07/20/2016  . Throat pain 07/20/2016  . OSA (obstructive sleep apnea) 12/27/2015  . Chronic headaches 12/27/2015  . Post-menopausal bleeding 12/27/2015  . Bipolar disorder ([place]) 04/14/2007  . Normocytic anemia 10/31/2005    Past Surgical History:  Procedure Laterality Date  . ECTOPIC PREGNANCY SURGERY    . EXPLORATORY LAPAROTOMY  2000   x2 (Dr. Kendall Flack)  . HERPES SIMPLEX VIRUS DFA    . TUBAL LIGATION  1987    OB History    Gravida Para Term Preterm AB Living   4 3 3  0 1 3   SAB TAB Ectopic Multiple Live Births   0 0 1 0 3       Home Medications    Prior to Admission medications     Medication Sig Start Date End Date Taking? Authorizing Provider  naproxen sodium (ANAPROX) 220 MG tablet Take 440 mg by mouth every 12 (twelve) hours as needed (pain).   Yes Historical Provider, MD  dicyclomine (BENTYL) 20 MG tablet Take 1 tablet (20 mg total) by mouth 4 (four) times daily -  before meals and at bedtime. 09/11/16   Duffy Bruce, MD  ondansetron (ZOFRAN) 4 MG tablet Take 1 tablet (4 mg total) by mouth every 8 (eight) hours as needed for nausea or vomiting. 09/11/16   Duffy Bruce, MD    Family History Family History  Problem Relation Age of Onset  . Hypertension Mother   . Diabetes Mother   . Heart disease Mother   . Thyroid disease Mother   . Heart attack Mother   . Glaucoma Mother   . Pancreatic cancer Other     family history  . Heart attack Maternal Grandmother   . Heart attack Maternal Grandfather   . Breast cancer Maternal Aunt   . Pancreatic cancer Maternal Uncle   . Diabetes Sister   . Hypertension Sister   . Colon cancer Neg Hx     Social History Social History  Substance Use Topics  . Smoking status: Current Some Day Smoker    Years: 36.00  Types: Cigarettes    Last attempt to quit: 07/20/2011  . Smokeless tobacco: Never Used  . Alcohol use 0.0 oz/week     Comment: Rare glass of wine     Allergies   Other   Review of Systems Review of Systems  Constitutional: Positive for fatigue. Negative for chills and fever.  HENT: Negative for congestion and rhinorrhea.   Eyes: Negative for visual disturbance.  Respiratory: Negative for cough, shortness of breath and wheezing.   Cardiovascular: Negative for chest pain and leg swelling.  Gastrointestinal: Positive for abdominal pain, diarrhea, nausea and vomiting.  Genitourinary: Negative for dysuria and flank pain.  Musculoskeletal: Negative for neck pain and neck stiffness.  Skin: Negative for rash and wound.  Allergic/Immunologic: Negative for immunocompromised state.  Neurological: Negative  for syncope, weakness and headaches.  All other systems reviewed and are negative.    Physical Exam Updated Vital Signs BP 109/85 (BP Location: Left Arm)   Pulse 70   Temp 97.4 F (36.3 C) (Oral)   Resp 16   LMP 03/03/2016 (Within Weeks)   SpO2 100%   Physical Exam  Constitutional: She is oriented to person, place, and time. She appears well-developed and well-nourished. No distress.  HENT:  Head: Normocephalic and atraumatic.  Eyes: Conjunctivae are normal.  Neck: Neck supple.  Cardiovascular: Normal rate, regular rhythm and normal heart sounds.  Exam reveals no friction rub.   No murmur heard. Pulmonary/Chest: Effort normal and breath sounds normal. No respiratory distress. She has no wheezes. She has no rales.  Abdominal: Soft. Bowel sounds are normal. She exhibits no distension. There is tenderness (LLQ). There is no rebound and no guarding.  Musculoskeletal: She exhibits no edema.  Neurological: She is alert and oriented to person, place, and time. She exhibits normal muscle tone.  Skin: Skin is warm. Capillary refill takes less than 2 seconds.  Psychiatric: She has a normal mood and affect.  Nursing note and vitals reviewed.    ED Treatments / Results  Labs (all labs ordered are listed, but only abnormal results are displayed) Labs Reviewed  COMPREHENSIVE METABOLIC PANEL - Abnormal; Notable for the following:       Result Value   Calcium 8.7 (*)    All other components within normal limits  URINALYSIS, ROUTINE W REFLEX MICROSCOPIC - Abnormal; Notable for the following:    APPearance HAZY (*)    Hgb urine dipstick SMALL (*)    Ketones, ur 5 (*)    Protein, ur 30 (*)    Squamous Epithelial / LPF 0-5 (*)    All other components within normal limits  LIPASE, BLOOD  CBC    EKG  EKG Interpretation None       Radiology Ct Abdomen Pelvis W Contrast  Result Date: 09/11/2016 CLINICAL DATA:  LEFT lower quadrant pain, nausea, vomiting and diarrhea for 2 days.  Diagnosed with flu last week. EXAM: CT ABDOMEN AND PELVIS WITH CONTRAST TECHNIQUE: Multidetector CT imaging of the abdomen and pelvis was performed using the standard protocol following bolus administration of intravenous contrast. CONTRAST:  18mL ISOVUE-300 IOPAMIDOL (ISOVUE-300) INJECTION 61% COMPARISON:  CT abdomen and pelvis June 18, 2014 FINDINGS: LOWER CHEST: Bibasilar atelectasis. Included heart size is normal. No pericardial effusion. HEPATOBILIARY: Focal fatty infiltration about the falciform ligament, present on prior CT. Liver is otherwise unremarkable. Normal gallbladder. PANCREAS: Normal. SPLEEN: Normal. ADRENALS/URINARY TRACT: Kidneys are orthotopic, demonstrating symmetric enhancement. No nephrolithiasis, hydronephrosis or solid renal masses. Stable too small to characterize hypodensity lower pole  of LEFT kidney. The unopacified ureters are normal in course and caliber. Delayed imaging through the kidneys demonstrates symmetric prompt contrast excretion within the proximal urinary collecting system. Urinary bladder is decompressed and unremarkable. Normal adrenal glands. STOMACH/BOWEL: The stomach, small and large bowel are normal in course and caliber without inflammatory changes. Colonic air-fluid levels. Fluid in the small bowel. Normal appendix. VASCULAR/LYMPHATIC: Aortoiliac vessels are normal in course and caliber. No lymphadenopathy by CT size criteria. REPRODUCTIVE: Normal. OTHER: No intraperitoneal free fluid or free air. MUSCULOSKELETAL: Nonacute. Small fat containing umbilical hernia. Severe RIGHT L4-5 facet arthropathy. Anterior pelvic wall scarring. IMPRESSION: Small and large bowel air-fluid levels compatible with enterocolitis without complication. Electronically Signed   By: Elon Alas M.D.   On: 09/11/2016 16:29    Procedures Procedures (including critical care time)  Medications Ordered in ED Medications  sodium chloride 0.9 % bolus 1,000 mL (0 mLs Intravenous  Stopped 09/11/16 1726)  ondansetron (ZOFRAN) injection 4 mg (4 mg Intravenous Given 09/11/16 1607)  iopamidol (ISOVUE-300) 61 % injection 100 mL (100 mLs Intravenous Contrast Given 09/11/16 1612)     Initial Impression / Assessment and Plan / ED Course  I have reviewed the triage vital signs and the nursing notes.  Pertinent labs & imaging results that were available during my care of the patient were reviewed by me and considered in my medical decision making (see chart for details).    56 yo F with h/o bipolar d/o here with nausea, vomiting, diarrhea after eating fish at local homeless shelter. On arrival, VSS and WNL. Exam as above. Lab work overall reassuring with normal WBC normal renal function and LFTs. UA with mild dehydration, hematuria but no pyuria. CT scan c/w uncomplicated enterocolitis. Suspect viral GI illness versus food-borne illness 2/2 eating at shelter. No fever, pt well appearing and she is tolerating PO. Will d/c with supportive care, push fluids, and PCP f/u.  Final Clinical Impressions(s) / ED Diagnoses   Final diagnoses:  Enteritis    New Prescriptions Discharge Medication List as of 09/11/2016  4:53 PM    START taking these medications   Details  dicyclomine (BENTYL) 20 MG tablet Take 1 tablet (20 mg total) by mouth 4 (four) times daily -  before meals and at bedtime., Starting Tue 09/11/2016, Print    ondansetron (ZOFRAN) 4 MG tablet Take 1 tablet (4 mg total) by mouth every 8 (eight) hours as needed for nausea or vomiting., Starting Tue 09/11/2016, Print         Duffy Bruce, MD 09/12/16 1210

## 2016-09-11 NOTE — ED Notes (Signed)
ED Provider at bedside. 

## 2016-09-12 ENCOUNTER — Telehealth: Payer: Self-pay | Admitting: *Deleted

## 2016-09-12 NOTE — Telephone Encounter (Signed)
Pt left message requesting a return phone call. She did not state the reason.

## 2016-09-13 NOTE — Telephone Encounter (Signed)
LM that we are returning her call to please give [place] a call back.

## 2016-09-14 NOTE — Telephone Encounter (Signed)
Called pt and left message requesting her to leave a new message on the nurse voice mail stating the question or problem that she is having. Also please state whether a detailed response can be left on her voice mail when we return the call.

## 2016-09-17 ENCOUNTER — Ambulatory Visit (HOSPITAL_COMMUNITY): Payer: Commercial Managed Care - HMO

## 2016-09-20 ENCOUNTER — Ambulatory Visit (HOSPITAL_COMMUNITY)
Admission: RE | Admit: 2016-09-20 | Discharge: 2016-09-20 | Disposition: A | Discharge status: Home / Self Care | Payer: Medicare HMO | Source: Ambulatory Visit | Attending: Obstetrics & Gynecology | Admitting: Obstetrics & Gynecology

## 2016-09-20 DIAGNOSIS — N950 Postmenopausal bleeding: Secondary | ICD-10-CM

## 2016-09-26 NOTE — Progress Notes (Deleted)
Cardiology Office Note   Date:  09/26/2016   ID:  Nicole [name], DOB [birth year], MRN [id number]  PCP:  Albin Felling, MD  Cardiologist:   Minus Breeding, MD   No chief complaint on file.     History of Present Illness: Nicole [name] is a 56 y.o. female who presents for evaluation of chest discomfort. She was seen by our practice in 2014 apparently for chest discomfort as well.   Echocardiogram 2009 was essentially normal.   I saw her last year for chest pain.  She had a negative POET (Plain Old Exercise Treadmill).  ***   She's been getting chest discomfort for some time. However, it is a left-sided focal discomfort it is reproducible with palpation. She was actually in the emergency room in December and I reviewed these records. She said constant discomfort. It is not waxing or waning. It is not brought on by activity. She doesn't describe associated symptoms. She has no nausea vomiting or diaphoresis. She doesn't have any palpitations, presyncope or syncope. She has no PND or orthopnea.  Of note her mother did have a heart attack at age 70 by her report but is alive and apparently hasn't had other heart procedures.  Past Medical History:  Diagnosis Date  . Anemia   . Depression    bipolar  . GERD (gastroesophageal reflux disease)   . History of migraine headaches    headaches reported as migraines  . Hypotension   . Obesity   . Palpitations    negative cardiac w/u per Dr. Verl Blalock 2/09  . Perimenopause   . Reflux esophagitis   . Sleep apnea    No CPAP    Past Surgical History:  Procedure Laterality Date  . ECTOPIC PREGNANCY SURGERY    . EXPLORATORY LAPAROTOMY  2000   x2 (Dr. Kendall Flack)  . HERPES SIMPLEX VIRUS DFA    . TUBAL LIGATION  1987     Current Outpatient Prescriptions  Medication Sig Dispense Refill  . dicyclomine (BENTYL) 20 MG tablet Take 1 tablet (20 mg total) by mouth 4 (four) times daily -  before meals and at bedtime. 40 tablet 0  . naproxen sodium  (ANAPROX) 220 MG tablet Take 440 mg by mouth every 12 (twelve) hours as needed (pain).    . ondansetron (ZOFRAN) 4 MG tablet Take 1 tablet (4 mg total) by mouth every 8 (eight) hours as needed for nausea or vomiting. 16 tablet 0   No current facility-administered medications for this visit.     Allergies:   Other    ROS:  Please see the history of present illness.   Otherwise, review of systems are positive for ***.   All other systems are reviewed and negative.    PHYSICAL EXAM: VS:  LMP 03/03/2016 (Within Weeks)  , BMI There is no height or weight on file to calculate BMI. GENERAL:  Well appearing NECK:  No jugular venous distention, waveform within normal limits, carotid upstroke brisk and symmetric, no bruits, no thyromegaly LUNGS:  Clear to auscultation bilaterally BACK:  No CVA tenderness CHEST:  Unremarkable, tender to touch HEART:  PMI not displaced or sustained,S1 and S2 within normal limits, no S3, no S4, no clicks, no rubs, no murmurs ABD:  Flat, positive bowel sounds normal in frequency in pitch, no bruits, no rebound, no guarding, no midline pulsatile mass, no hepatomegaly, no splenomegaly EXT:  2 plus pulses throughout, no edema, no cyanosis no clubbing   EKG:  EKG is *** ordered today. The ekg ordered today demonstrates sinus rhythm, rate ***, axis within normal limits, intervals within normal limits, nonspecific inferior T-wave inversions unchanged from previous EKG in 12/16 but not the most recent EKG   Recent Labs: 12/27/2015: TSH 0.574 09/11/2016: ALT 22; BUN 8; Creatinine, Ser 0.63; Hemoglobin 12.5; Platelets 231; Potassium 3.6; Sodium 138    Lipid Panel    Component Value Date/Time   CHOL 203 (H) 12/27/2015 1124   TRIG 99 12/27/2015 1124   HDL 58 12/27/2015 1124   CHOLHDL 3.5 12/27/2015 1124   CHOLHDL 3.1 Ratio 01/04/2010 2112   VLDL 13 01/04/2010 2112   LDLCALC 125 (H) 12/27/2015 1124      Wt Readings from Last 3 Encounters:  08/26/16 164 lb 9 oz  (74.6 kg)  07/20/16 170 lb 12.8 oz (77.5 kg)  07/09/16 173 lb (78.5 kg)      Other studies Reviewed: Additional studies/ records that were reviewed today include: *** Review of the above records demonstrates:  ***    ASSESSMENT AND PLAN:  CHEST PAIN:  ***  OVERWEIGHT:  She is working on weight loss and has gone up and down with her weights.  ***    Current medicines are reviewed at length with the patient today.  The patient does not have concerns regarding medicines.  The following changes have been made:  ***  Labs/ tests ordered today include: ***  No orders of the defined types were placed in this encounter.    Disposition:   FU with me ***   Signed, Minus Breeding, MD  09/26/2016 12:55 PM    [facility]

## 2016-09-27 ENCOUNTER — Ambulatory Visit: Payer: Medicare HMO | Admitting: Cardiology

## 2016-09-30 NOTE — Progress Notes (Signed)
Cardiology Office Note   Date:  10/01/2016   ID:  [name], DOB 07/01/1961, MRN [id number]  PCP:  Albin Felling, MD  Cardiologist:   Minus Breeding, MD   Chief Complaint  Patient presents with  . Chest Pain      History of Present Illness: Nicole [name] is a 56 y.o. female who presents for evaluation of chest pain.  I saw her last year for evaluation of chest pain.  She had a negative POET (Plain Old Exercise Treadmill).    She had an echocardiogram 2009 which was essentially normal.  However, since I last saw her she has had no cardiac complaints.  The patient denies any new symptoms such as chest discomfort, neck or arm discomfort. There has been no new shortness of breath, PND or orthopnea. There have been no reported palpitations, presyncope or syncope.  She is homeless but might be getting her own place soon.  She has exercised and does a great deal of walking and has no complaints with this.    Past Medical History:  Diagnosis Date  . Anemia   . Depression    bipolar  . GERD (gastroesophageal reflux disease)   . History of migraine headaches    headaches reported as migraines  . Hypotension   . Obesity   . Palpitations    negative cardiac w/u per Dr. Verl Blalock 2/09  . Perimenopause   . Reflux esophagitis   . Sleep apnea    No CPAP    Past Surgical History:  Procedure Laterality Date  . COLONOSCOPY    . ECTOPIC PREGNANCY SURGERY    . EXPLORATORY LAPAROTOMY  2000   x2 (Dr. Kendall Flack)  . HERPES SIMPLEX VIRUS DFA    . TUBAL LIGATION  1987     Current Outpatient Prescriptions  Medication Sig Dispense Refill  . HYDROcodone-acetaminophen (NORCO/VICODIN) 5-325 MG tablet Take 1 tablet by mouth every 4 (four) hours as needed for moderate pain.     No current facility-administered medications for this visit.     Allergies:   Other    ROS:  Please see the history of present illness.   Otherwise, review of systems are positive for none.   All other systems are  reviewed and negative.    PHYSICAL EXAM: VS:  BP 116/73   Pulse 84   Ht 5' 5.5" (1.664 m)   Wt 162 lb (73.5 kg)   LMP 03/03/2016 (Within Weeks)   BMI 26.55 kg/m  , BMI Body mass index is 26.55 kg/m. GENERAL:  Well appearing HEENT:  Pupils equal round and reactive, fundi not visualized, oral mucosa unremarkable NECK:  No jugular venous distention, waveform within normal limits, carotid upstroke brisk and symmetric, no bruits, no thyromegaly LYMPHATICS:  No cervical, inguinal adenopathy LUNGS:  Clear to auscultation bilaterally BACK:  No CVA tenderness CHEST:  Unremarkable, tender to touch HEART:  PMI not displaced or sustained,S1 and S2 within normal limits, no S3, no S4, no clicks, no rubs, no murmurs ABD:  Flat, positive bowel sounds normal in frequency in pitch, no bruits, no rebound, no guarding, no midline pulsatile mass, no hepatomegaly, no splenomegaly EXT:  2 plus pulses throughout, no edema, no cyanosis no clubbing SKIN:  No rashes no nodules NEURO:  Cranial nerves II through XII grossly intact, motor grossly intact throughout PSYCH:  Cognitively intact, oriented to person place and time    EKG:  EKG is not ordered today.   Recent Labs: 12/27/2015:  TSH 0.574 09/11/2016: ALT 22; BUN 8; Creatinine, Ser 0.63; Hemoglobin 12.5; Platelets 231; Potassium 3.6; Sodium 138    Lipid Panel    Component Value Date/Time   CHOL 203 (H) 12/27/2015 1124   TRIG 99 12/27/2015 1124   HDL 58 12/27/2015 1124   CHOLHDL 3.5 12/27/2015 1124   CHOLHDL 3.1 Ratio 01/04/2010 2112   VLDL 13 01/04/2010 2112   LDLCALC 125 (H) 12/27/2015 1124      Wt Readings from Last 3 Encounters:  10/01/16 159 lb 6.4 oz (72.3 kg)  10/01/16 162 lb (73.5 kg)  08/26/16 164 lb 9 oz (74.6 kg)      Other studies Reviewed: Additional studies/ records that were reviewed today include:  None Review of the above records demonstrates:      ASSESSMENT AND PLAN:  CHEST PAIN:  She no longer has this  complaint.  No change in therapy is indicated.  No further testing is needed.    OVERWEIGHT:  She has lost 30 to 40 lbs with diet and exercise.  I congratulated her and she will continue with more of the same.   TOBACCO:    She says that she stopped smoking 2 days ago!   Current medicines are reviewed at length with the patient today.  The patient does not have concerns regarding medicines.  The following changes have been made:  None  Labs/ tests ordered today include:  None  No orders of the defined types were placed in this encounter.    Disposition:   FU with me as needed.       Signed, Minus Breeding, MD  10/01/2016 9:50 PM    [facility]

## 2016-10-01 ENCOUNTER — Encounter: Payer: Self-pay | Admitting: Internal Medicine

## 2016-10-01 ENCOUNTER — Encounter: Payer: Self-pay | Admitting: Cardiology

## 2016-10-01 ENCOUNTER — Ambulatory Visit (INDEPENDENT_AMBULATORY_CARE_PROVIDER_SITE_OTHER): Payer: Medicare HMO | Admitting: Internal Medicine

## 2016-10-01 ENCOUNTER — Ambulatory Visit (INDEPENDENT_AMBULATORY_CARE_PROVIDER_SITE_OTHER): Payer: Medicare HMO | Admitting: Cardiology

## 2016-10-01 VITALS — BP 116/73 | HR 84 | Ht 65.5 in | Wt 162.0 lb

## 2016-10-01 VITALS — BP 124/70 | HR 78 | Ht 65.5 in | Wt 159.4 lb

## 2016-10-01 DIAGNOSIS — K6289 Other specified diseases of anus and rectum: Secondary | ICD-10-CM

## 2016-10-01 DIAGNOSIS — Z79899 Other long term (current) drug therapy: Secondary | ICD-10-CM

## 2016-10-01 DIAGNOSIS — R072 Precordial pain: Secondary | ICD-10-CM | POA: Diagnosis not present

## 2016-10-01 DIAGNOSIS — G8929 Other chronic pain: Secondary | ICD-10-CM

## 2016-10-01 NOTE — Progress Notes (Signed)
   Nicole [name] 56 y.o. March 14, 1961 [id number]  Assessment & Plan:   Encounter Diagnosis  Name Primary?  . Chronic rectal pain Yes    The patient has had similar rectal pain since at least 2013 based upon my chart review. She declined a rectal exam and anoscopy today and said she would reschedule have that done. I did review the negative pelvic ultrasound that was in the chart from February 1 with the patient.  One has to wonder if this is not a somatization type situation.  Subjective:   Chief Complaint: Rectal pain  HPI  Patient has a chronic constant rectal pain. It is well documented in the chart. She's had a colonoscopy rectal exams and I think anoscopy in the past. She returns today with these complaints of pain throughout the day and even at night though she does not seem to have pain when she defecates nor does she have rectal bleeding.It is a pulling sensation "up inside" Medications, allergies, past medical history, past surgical history, family history and social history are reviewed and updated in the EMR.  Review of Systems  As above Objective:   Physical Exam BP 124/70   Pulse 78   Ht 5' 5.5" (1.664 m)   Wt 159 lb 6.4 oz (72.3 kg)   LMP 03/03/2016 (Within Weeks)   BMI 26.12 kg/m   She declined examination today.  15 minutes time spent with patient > half in counseling coordination of care

## 2016-10-01 NOTE — Patient Instructions (Signed)
Medication Instructions:  Continue current medciations  Labwork: None Ordered  Testing/Procedures: None ordered  Follow-Up: Your physician recommends that you schedule a follow-up appointment in: As Needed   Any Other Special Instructions Will Be Listed Below (If Applicable).   If you need a refill on your cardiac medications before your next appointment, please call your pharmacy.

## 2016-10-01 NOTE — Patient Instructions (Signed)
  Please come back when your ready for [place] to do a rectal exam.    I appreciate the opportunity to care for you. Ronney Lion, [facility]

## 2016-10-12 ENCOUNTER — Ambulatory Visit: Payer: Medicare HMO | Admitting: Obstetrics & Gynecology

## 2016-10-23 ENCOUNTER — Ambulatory Visit (INDEPENDENT_AMBULATORY_CARE_PROVIDER_SITE_OTHER): Payer: Medicare HMO | Admitting: Internal Medicine

## 2016-10-23 ENCOUNTER — Encounter: Payer: Self-pay | Admitting: Internal Medicine

## 2016-10-23 DIAGNOSIS — R8299 Other abnormal findings in urine: Secondary | ICD-10-CM | POA: Diagnosis not present

## 2016-10-23 DIAGNOSIS — R300 Dysuria: Secondary | ICD-10-CM

## 2016-10-23 DIAGNOSIS — N3000 Acute cystitis without hematuria: Secondary | ICD-10-CM

## 2016-10-23 DIAGNOSIS — N390 Urinary tract infection, site not specified: Secondary | ICD-10-CM | POA: Insufficient documentation

## 2016-10-23 DIAGNOSIS — R102 Pelvic and perineal pain: Secondary | ICD-10-CM

## 2016-10-23 LAB — POCT URINALYSIS DIPSTICK
BILIRUBIN UA: NEGATIVE
Glucose, UA: NEGATIVE
Nitrite, UA: POSITIVE
PH UA: 8.5
SPEC GRAV UA: 1.02
Urobilinogen, UA: 1

## 2016-10-23 MED ORDER — NITROFURANTOIN MONOHYD MACRO 100 MG PO CAPS: 100.0000 mg | ORAL_CAPSULE | Freq: Two times a day (BID) | ORAL | 0 refills | Status: AC

## 2016-10-23 NOTE — Assessment & Plan Note (Signed)
Patient's symptoms and dipstick showing +nitrite and large leuk is consistent with UTI, uncomplicated. Will treat with nitrofurantoin for 5 days.  - f/up ucx to rule out sterile pyuria.

## 2016-10-23 NOTE — Progress Notes (Signed)
Case discussed with Dr. Ahmed at the time of the visit. We reviewed the resident's history and exam and pertinent patient test results. I agree with the assessment, diagnosis, and plan of care documented in the resident's note. 

## 2016-10-23 NOTE — Patient Instructions (Addendum)
Take Macrobid 100mg  twice a day for 5 days for your bladder infection.  Follow up with Dr. Arcelia Jew for other health issues.

## 2016-10-23 NOTE — Progress Notes (Signed)
   CC: dysuria  HPI:  [name] is a 56 y.o. with PMH as listed below is here with complaint of dysuria  Past Medical History:  Diagnosis Date  . Anemia   . Depression    bipolar  . GERD (gastroesophageal reflux disease)   . History of migraine headaches    headaches reported as migraines  . Hypotension   . Obesity   . Palpitations    negative cardiac w/u per Dr. Verl Blalock 2/09  . Perimenopause   . Reflux esophagitis   . Sleep apnea    No CPAP   Dysuria for last 2 days, burns to void, also has some pelvic pain associated with it. Has foul smelling urine, not sure about hematuria. No n/v, diarrhea, no fevers. All ucx in the past had been negative. Urein dipstick showed +nitrite and large leuk. No allergies to abx.     Review of Systems:   Review of Systems  Constitutional: Negative for chills and fever.  Cardiovascular: Negative for chest pain and palpitations.  Genitourinary: Positive for dysuria and flank pain. Negative for hematuria.  Neurological: Negative for dizziness and headaches.     Physical Exam:  Vitals:   10/23/16 1109  BP: 104/79  Pulse: 100  Temp: 98.2 F (36.8 C)  TempSrc: Oral  SpO2: 99%  Weight: 158 lb 12.8 oz (72 kg)  Physical Exam  Constitutional: She is oriented to person, place, and time. She appears well-developed and well-nourished. No distress.  HENT:  Head: Normocephalic and atraumatic.  Eyes: Conjunctivae are normal. Right eye exhibits no discharge. Left eye exhibits no discharge.  Cardiovascular: Normal rate and regular rhythm.  Exam reveals no gallop and no friction rub.   No murmur heard. Respiratory: Effort normal and breath sounds normal. No respiratory distress. She has no wheezes.  GI: Soft. Bowel sounds are normal. She exhibits no distension and no mass. There is no guarding.  Some suprapubic tenderness, mild.   Neurological: She is alert and oriented to person, place, and time. No cranial nerve deficit.  Skin: She is not  diaphoretic.   Assessment & Plan:   See Encounters Tab for problem based charting.  Patient discussed with Dr. Eppie Gibson

## 2016-10-25 DIAGNOSIS — K219 Gastro-esophageal reflux disease without esophagitis: Secondary | ICD-10-CM | POA: Diagnosis not present

## 2016-10-25 DIAGNOSIS — R490 Dysphonia: Secondary | ICD-10-CM | POA: Insufficient documentation

## 2016-10-25 DIAGNOSIS — Z720 Tobacco use: Secondary | ICD-10-CM | POA: Diagnosis not present

## 2016-10-26 LAB — URINE CULTURE

## 2016-11-01 ENCOUNTER — Encounter (HOSPITAL_COMMUNITY): Payer: Self-pay

## 2016-11-01 ENCOUNTER — Emergency Department (HOSPITAL_COMMUNITY)
Admission: EM | Admit: 2016-11-01 | Discharge: 2016-11-01 | Disposition: A | Discharge status: Home / Self Care | Payer: Medicare HMO | Attending: Emergency Medicine | Admitting: Emergency Medicine

## 2016-11-01 ENCOUNTER — Emergency Department (HOSPITAL_COMMUNITY): Payer: Medicare HMO

## 2016-11-01 DIAGNOSIS — Z79899 Other long term (current) drug therapy: Secondary | ICD-10-CM | POA: Diagnosis not present

## 2016-11-01 DIAGNOSIS — F17200 Nicotine dependence, unspecified, uncomplicated: Secondary | ICD-10-CM | POA: Diagnosis not present

## 2016-11-01 DIAGNOSIS — J111 Influenza due to unidentified influenza virus with other respiratory manifestations: Secondary | ICD-10-CM | POA: Diagnosis not present

## 2016-11-01 DIAGNOSIS — R509 Fever, unspecified: Secondary | ICD-10-CM | POA: Diagnosis not present

## 2016-11-01 DIAGNOSIS — R05 Cough: Secondary | ICD-10-CM | POA: Diagnosis not present

## 2016-11-01 DIAGNOSIS — R69 Illness, unspecified: Secondary | ICD-10-CM

## 2016-11-01 MED ORDER — ACETAMINOPHEN 325 MG PO TABS
650.0000 mg | ORAL_TABLET | Freq: Once | ORAL | Status: AC
  Administered 2016-11-01: 650 mg via ORAL

## 2016-11-01 MED ORDER — ACETAMINOPHEN 325 MG PO TABS
650.0000 mg | ORAL_TABLET | Freq: Once | ORAL | Status: DC | PRN
  Filled 2016-11-01: qty 2

## 2016-11-01 NOTE — Discharge Instructions (Signed)
Take Tylenol every 4 hours as needed for aches or for temperature higher than 100.4 while awake.Make sure that you drink at least six 8 ounce glasses of water or Gatorade each day in order to stay well-hydrated. See your primary care physician if not feeling better in 3 days. Return if concern for any reason. Ask your primary care physician to help you to stop smoking

## 2016-11-01 NOTE — ED Provider Notes (Addendum)
[facility] DEPT Provider Note   CSN: [id number] Arrival date & time: 11/01/16  1439     History   Chief Complaint Chief Complaint  Patient presents with  . Cough  . Fever    HPI Nicole [name] is a 56 y.o. female.Complains of cough yellow sputum and subjective fever this company by diffuse myalgias onset yesterday. No treatment prior to coming here. No other associated symptoms denies sore throat denies headache  HPI  Past Medical History:  Diagnosis Date  . Anemia   . Depression    bipolar  . GERD (gastroesophageal reflux disease)   . History of migraine headaches    headaches reported as migraines  . Hypotension   . Obesity   . Palpitations    negative cardiac w/u per Dr. Verl Blalock 2/09  . Perimenopause   . Reflux esophagitis   . Sleep apnea    No CPAP  . UTI (urinary tract infection)     Patient Active Problem List   Diagnosis Date Noted  . UTI (urinary tract infection) 10/23/2016  . Pain of left sternoclavicular joint 07/20/2016  . Throat pain 07/20/2016  . OSA (obstructive sleep apnea) 12/27/2015  . Chronic headaches 12/27/2015  . Post-menopausal bleeding 12/27/2015  . Bipolar disorder ([place]) 04/14/2007  . Normocytic anemia 10/31/2005    Past Surgical History:  Procedure Laterality Date  . COLONOSCOPY    . ECTOPIC PREGNANCY SURGERY    . EXPLORATORY LAPAROTOMY  2000   x2 (Dr. Kendall Flack)  . HERPES SIMPLEX VIRUS DFA    . TUBAL LIGATION  1987    OB History    Gravida Para Term Preterm AB Living   4 3 3  0 1 3   SAB TAB Ectopic Multiple Live Births   0 0 1 0 3       Home Medications    Prior to Admission medications   Medication Sig Start Date End Date Taking? Authorizing Provider  HYDROcodone-acetaminophen (NORCO/VICODIN) 5-325 MG tablet Take 1 tablet by mouth every 4 (four) hours as needed for moderate pain.    Historical Provider, MD    Family History Family History  Problem Relation Age of Onset  . Hypertension Mother   . Diabetes  Mother   . Heart disease Mother   . Thyroid disease Mother   . Heart attack Mother   . Glaucoma Mother   . Pancreatic cancer Other     family history  . Heart attack Maternal Grandmother   . Heart attack Maternal Grandfather   . Breast cancer Maternal Aunt   . Pancreatic cancer Maternal Uncle   . Diabetes Sister   . Hypertension Sister   . Colon cancer Neg Hx     Social History Social History  Substance Use Topics  . Smoking status: Current Some Day Smoker    Packs/day: 0.25    Years: 36.00    Last attempt to quit: 07/20/2011  . Smokeless tobacco: Never Used     Comment: marijuana  . Alcohol use 0.0 oz/week     Comment: Rare glass of wine   Positive smoker positive marijuana use no other drug use  Allergies   Other No known drug allergies  Review of Systems Review of Systems  Constitutional: Positive for fever.  HENT: Negative.   Respiratory: Positive for cough.   Cardiovascular: Negative.   Gastrointestinal: Negative.   Musculoskeletal: Positive for myalgias.  Skin: Negative.   Neurological: Negative.   Psychiatric/Behavioral: Negative.   All other systems reviewed and  are negative.    Physical Exam Updated Vital Signs BP 107/90 (BP Location: Left Arm)   Pulse (!) 106   Temp (!) 101.4 F (38.6 C) (Oral)   Resp 20   Ht 5\' 5"  (1.651 m)   Wt 160 lb (72.6 kg)   LMP 03/03/2016 (Within Weeks)   SpO2 99%   BMI 26.63 kg/m   Physical Exam  Constitutional: She is oriented to person, place, and time. She appears well-developed and well-nourished. No distress.  HENT:  Head: Normocephalic and atraumatic.  Eyes: Conjunctivae are normal. Pupils are equal, round, and reactive to light.  Neck: Neck supple. No tracheal deviation present. No thyromegaly present.  Cardiovascular: Normal rate and regular rhythm.   No murmur heard. Pulmonary/Chest: Effort normal.  Offing frequently  Abdominal: Soft. Bowel sounds are normal. She exhibits no distension. There is no  tenderness.  Musculoskeletal: Normal range of motion. She exhibits no edema or tenderness.  Neurological: She is alert and oriented to person, place, and time. Coordination normal.  Skin: Skin is warm and dry. No rash noted.  Psychiatric: She has a normal mood and affect.  Nursing note and vitals reviewed.    ED Treatments / Results  Labs (all labs ordered are listed, but only abnormal results are displayed) Labs Reviewed - No data to display  EKG  EKG Interpretation None       Radiology No results found.  Procedures Procedures (including critical care time)  Medications Ordered in ED Medications  acetaminophen (TYLENOL) tablet 650 mg (not administered)  acetaminophen (TYLENOL) tablet 650 mg (not administered)     Initial Impression / Assessment and Plan / ED Course  I have reviewed the triage vital signs and the nursing notes.  Pertinent labs & imaging results that were available during my care of the patient were reviewed by me and considered in my medical decision making (see chart for details).    6 PM feels improved after treatment with Tylenol. Chest x-ray viewed by me. Results for orders placed or performed in visit on 10/23/16  Culture, Urine  Result Value Ref Range   Urine Culture, Routine Final report (A)    Urine Culture result 1 Escherichia coli (A)    ANTIMICROBIAL SUSCEPTIBILITY Comment   POCT urinalysis dipstick  Result Value Ref Range   Color, UA dark yellow    Clarity, UA cloudy    Glucose, UA negative    Bilirubin, UA negative    Ketones, UA 15mg /dl    Spec Grav, UA 1.020    Blood, UA *small    pH, UA 8.5    Protein, UA *30mg /dl    Urobilinogen, UA 1.0    Nitrite, UA *positive    Leukocytes, UA large (3+) (A) Negative   Dg Chest 2 View  Result Date: 11/01/2016 CLINICAL DATA:  55 y/o  F; productive cough and fever. EXAM: CHEST  2 VIEW COMPARISON:  08/26/2016 chest radiograph FINDINGS: Stable heart size and mediastinal contours are within  normal limits. Both lungs are clear. The visualized skeletal structures are unremarkable. IMPRESSION: No active cardiopulmonary disease. Electronically Signed   By: Kristine Garbe M.D.   On: 11/01/2016 17:30    I counseled patient for 5 minutes on smoking cessation Plan Tylenol encourage oral hydration. See PMD if not feeling better in 3 days. Final Clinical Impressions(s) / ED Diagnoses  Diagnosis #influenza-like illness #2Tobacco abuse Final diagnoses:  None    New Prescriptions New Prescriptions   No medications on file  Orlie Dakin, MD 11/01/16 5300    Orlie Dakin, MD 11/01/16 5110

## 2016-11-01 NOTE — ED Notes (Signed)
Asked pt to please change into gown x2.  Gave her warm blankets

## 2016-11-01 NOTE — ED Triage Notes (Signed)
Per EMS- Patient c/o a productive cough and fever.

## 2016-11-05 ENCOUNTER — Ambulatory Visit (INDEPENDENT_AMBULATORY_CARE_PROVIDER_SITE_OTHER): Payer: Medicare HMO | Admitting: Internal Medicine

## 2016-11-05 ENCOUNTER — Encounter: Payer: Self-pay | Admitting: Internal Medicine

## 2016-11-05 VITALS — BP 106/66 | HR 89 | Temp 98.0°F | Ht 65.5 in | Wt 155.3 lb

## 2016-11-05 DIAGNOSIS — Z87440 Personal history of urinary (tract) infections: Secondary | ICD-10-CM | POA: Diagnosis not present

## 2016-11-05 DIAGNOSIS — R6889 Other general symptoms and signs: Secondary | ICD-10-CM

## 2016-11-05 DIAGNOSIS — R3911 Hesitancy of micturition: Secondary | ICD-10-CM | POA: Diagnosis not present

## 2016-11-05 DIAGNOSIS — Z09 Encounter for follow-up examination after completed treatment for conditions other than malignant neoplasm: Secondary | ICD-10-CM

## 2016-11-05 DIAGNOSIS — N3000 Acute cystitis without hematuria: Secondary | ICD-10-CM

## 2016-11-05 NOTE — Progress Notes (Signed)
Internal Medicine Clinic Attending  Case discussed with Dr. Truong at the time of the visit.  We reviewed the resident's history and exam and pertinent patient test results.  I agree with the assessment, diagnosis, and plan of care documented in the resident's note.  

## 2016-11-05 NOTE — Assessment & Plan Note (Signed)
Resolved. Patient was seen in the ED on March 15 for flu like sx. She was discharged home with Tylenol. she is afebrile today. Her symptoms have improved. CTM

## 2016-11-05 NOTE — Progress Notes (Signed)
   CC: UTI and flu f/u  HPI:  [name] is a 56 y.o. with past medical history as outlined below who presents to clinic for UTI flu follow-up. She was seen in clinic on March 6 and treated for UTI with Macrobid. Urine culture revealed Escherichia coli UTI sensitive to Macrobid. She is still having a sensation of urinary hesitancy but denies any fevers, dysuria, abdominal pain, nausea, vomiting. She is also seen in the ED on March 15 for flulike symptoms. A chest x-ray at that time was negative. She was discharged home on Tylenol. Since then she feels better and denies any shortness of breath or recent fevers. Please see problem list for further details of patient's chronic medical issues.  Past Medical History:  Diagnosis Date  . Anemia   . Depression    bipolar  . GERD (gastroesophageal reflux disease)   . History of migraine headaches    headaches reported as migraines  . Hypotension   . Obesity   . Palpitations    negative cardiac w/u per Dr. Verl Blalock 2/09  . Perimenopause   . Reflux esophagitis   . Sleep apnea    No CPAP  . UTI (urinary tract infection)     Review of Systems:  Pertinent review of systems noted in history of present illness. Review of systems otherwise negative.  Physical Exam:  Vitals:   11/05/16 0848  BP: 106/66  Pulse: 89  Temp: 98 F (36.7 C)  TempSrc: Oral  SpO2: 100%  Weight: 155 lb 4.8 oz (70.4 kg)   Physical Exam  Constitutional:  appears well-developed and well-nourished. No distress.  HENT:  Head: Normocephalic and atraumatic.  Nose: Nose normal.  Cardiovascular: Normal rate, regular rhythm and normal heart sounds.  Exam reveals no gallop and no friction rub.   No murmur heard. Pulmonary/Chest: Effort normal and breath sounds normal. No respiratory distress.  has no wheezes.no rales.  Neurological: alert and oriented to person, place, and time.  Skin: Skin is warm and dry. No rash noted.  not diaphoretic. No erythema. No pallor.    Assessment & Plan:   See Encounters Tab for problem based charting.  Patient discussed with Dr. Lynnae January

## 2016-11-05 NOTE — Assessment & Plan Note (Signed)
Assessment: Patient finished course of Macrobid x 5 days. She feels like her symptoms have not fully resolved stating that she has mild symptoms of urinary hesitancy. She is afebrile and not having any dysuria. Likely UTI resolved as she is not having dysuria and urine culture reveals sensitivity to macrobid.  P: CTM. She has f/u with PCP on 4/17

## 2016-11-19 ENCOUNTER — Encounter: Payer: Self-pay | Admitting: Pediatric Intensive Care

## 2016-11-22 ENCOUNTER — Encounter (HOSPITAL_COMMUNITY): Payer: Self-pay | Admitting: Emergency Medicine

## 2016-11-22 ENCOUNTER — Emergency Department (HOSPITAL_COMMUNITY)
Admission: EM | Admit: 2016-11-22 | Discharge: 2016-11-22 | Disposition: A | Discharge status: Home / Self Care | Payer: Medicare HMO | Attending: Dermatology | Admitting: Dermatology

## 2016-11-22 DIAGNOSIS — Z5321 Procedure and treatment not carried out due to patient leaving prior to being seen by health care provider: Secondary | ICD-10-CM | POA: Insufficient documentation

## 2016-11-22 DIAGNOSIS — R030 Elevated blood-pressure reading, without diagnosis of hypertension: Secondary | ICD-10-CM | POA: Diagnosis not present

## 2016-11-22 DIAGNOSIS — M79604 Pain in right leg: Secondary | ICD-10-CM | POA: Diagnosis not present

## 2016-11-22 DIAGNOSIS — M79651 Pain in right thigh: Secondary | ICD-10-CM | POA: Insufficient documentation

## 2016-11-22 DIAGNOSIS — F17200 Nicotine dependence, unspecified, uncomplicated: Secondary | ICD-10-CM | POA: Diagnosis not present

## 2016-11-22 NOTE — ED Triage Notes (Signed)
Patient arrived with EMS from [facility] reports right posterior thigh muscle ache with mild swelling onset this evening while lying on bed , denies injury or fall /ambulatory .

## 2016-12-03 ENCOUNTER — Telehealth: Payer: Self-pay | Admitting: Internal Medicine

## 2016-12-03 NOTE — Telephone Encounter (Signed)
Calling to confirm appt for 12/04/16 at 2:15 lmtcb

## 2016-12-04 ENCOUNTER — Encounter: Payer: Medicare HMO | Admitting: Internal Medicine

## 2016-12-06 NOTE — Congregational Nurse Program (Signed)
Congregational Nurse Program Note  Date of Encounter: 12/03/2016  Past Medical History: Past Medical History:  Diagnosis Date  . Anemia   . Depression    bipolar  . GERD (gastroesophageal reflux disease)   . History of migraine headaches    headaches reported as migraines  . Hypotension   . Obesity   . Palpitations    negative cardiac w/u per Dr. Verl Blalock 2/09  . Perimenopause   . Reflux esophagitis   . Sleep apnea    No CPAP  . UTI (urinary tract infection)     Encounter Details:     CNP Questionnaire - 12/03/16 1912      Patient Demographics   Is this a new or existing patient? New   Patient is considered a/an Not Applicable   Race African-American/Black     Patient Assistance   Location of Patient Assistance Not Applicable   Patient's financial/insurance status Low Income;Private Insurance Coverage   Uninsured Patient ([place]/Care Connects) No   Patient referred to apply for the following financial assistance Not Applicable   Food insecurities addressed Not Applicable   Transportation assistance Yes   Type of Assistance Bus Pass Given   Assistance securing medications No   Educational health offerings Other     Encounter Details   Primary purpose of visit Other   Was an Emergency Department visit averted? Not Applicable   Does patient have a medical provider? Yes   Patient referred to Not Applicable   Was a mental health screening completed? (GAINS tool) No   Does patient have dental issues? No   Does patient have vision issues? No   Does your patient have an abnormal blood pressure today? No   Since previous encounter, have you referred patient for abnormal blood pressure that resulted in a new diagnosis or medication change? No   Does your patient have an abnormal blood glucose today? No   Since previous encounter, have you referred patient for abnormal blood glucose that resulted in a new diagnosis or medication change? No   Was there a life-saving  intervention made? No     Requested bus passes to transport for medical appointment.  Two bus passes provided

## 2016-12-10 NOTE — Congregational Nurse Program (Signed)
Congregational Nurse Program Note  Date of Encounter: 11/19/2016  Past Medical History: Past Medical History:  Diagnosis Date  . Anemia   . Depression    bipolar  . GERD (gastroesophageal reflux disease)   . History of migraine headaches    headaches reported as migraines  . Hypotension   . Obesity   . Palpitations    negative cardiac w/u per Dr. Verl Blalock 2/09  . Perimenopause   . Reflux esophagitis   . Sleep apnea    No CPAP  . UTI (urinary tract infection)     Encounter Details:     CNP Questionnaire - 12/03/16 1912      Patient Demographics   Is this a new or existing patient? New   Patient is considered a/an Not Applicable   Race African-American/Black     Patient Assistance   Location of Patient Assistance Not Applicable   Patient's financial/insurance status Low Income;Private Insurance Coverage   Uninsured Patient ([place] Card/Care Connects) No   Patient referred to apply for the following financial assistance Not Applicable   Food insecurities addressed Not Applicable   Transportation assistance Yes   Type of Assistance Bus Pass Given   Assistance securing medications No   Educational health offerings Other     Encounter Details   Primary purpose of visit Other   Was an Emergency Department visit averted? Not Applicable   Does patient have a medical provider? Yes   Patient referred to Not Applicable   Was a mental health screening completed? (GAINS tool) No   Does patient have dental issues? No   Does patient have vision issues? No   Does your patient have an abnormal blood pressure today? No   Since previous encounter, have you referred patient for abnormal blood pressure that resulted in a new diagnosis or medication change? No   Does your patient have an abnormal blood glucose today? No   Since previous encounter, have you referred patient for abnormal blood glucose that resulted in a new diagnosis or medication change? No   Was there a life-saving  intervention made? No     Client had flu and has follow up appointment at [facility] [place] [facility]. Needs bus passes.

## 2016-12-12 NOTE — Congregational Nurse Program (Signed)
Congregational Nurse Program Note  Date of Encounter: 12/10/2016  Past Medical History: Past Medical History:  Diagnosis Date  . Anemia   . Depression    bipolar  . GERD (gastroesophageal reflux disease)   . History of migraine headaches    headaches reported as migraines  . Hypotension   . Obesity   . Palpitations    negative cardiac w/u per Dr. Verl Blalock 2/09  . Perimenopause   . Reflux esophagitis   . Sleep apnea    No CPAP  . UTI (urinary tract infection)     Encounter Details:     CNP Questionnaire - 12/10/16 1612      Patient Demographics   Is this a new or existing patient? Existing   Patient is considered a/an Not Applicable   Race African-American/Black     Patient Assistance   Location of Patient Assistance Not Applicable   Patient's financial/insurance status Low Income;Private Insurance Coverage   Uninsured Patient ([place] Card/Care Connects) No   Patient referred to apply for the following financial assistance Not Applicable   Food insecurities addressed Not Applicable   Transportation assistance Yes   Type of Assistance Bus Pass Given   Assistance securing medications No   Educational health offerings Other     Encounter Details   Primary purpose of visit Other   Was an Emergency Department visit averted? Not Applicable   Does patient have a medical provider? Yes   Patient referred to Not Applicable   Was a mental health screening completed? (GAINS tool) No   Does patient have dental issues? No   Does patient have vision issues? No   Does your patient have an abnormal blood pressure today? No   Since previous encounter, have you referred patient for abnormal blood pressure that resulted in a new diagnosis or medication change? No   Does your patient have an abnormal blood glucose today? No   Since previous encounter, have you referred patient for abnormal blood glucose that resulted in a new diagnosis or medication change? No   Was there a life-saving  intervention made? No     Requested bus passes for a doctor appointment.  Two bus passes given

## 2016-12-28 ENCOUNTER — Ambulatory Visit: Payer: Medicare HMO

## 2017-01-01 ENCOUNTER — Ambulatory Visit: Payer: Medicare HMO

## 2017-01-04 ENCOUNTER — Ambulatory Visit: Payer: Medicare HMO

## 2017-01-10 ENCOUNTER — Ambulatory Visit (INDEPENDENT_AMBULATORY_CARE_PROVIDER_SITE_OTHER): Payer: Medicare HMO | Admitting: *Deleted

## 2017-01-10 ENCOUNTER — Other Ambulatory Visit (HOSPITAL_COMMUNITY)
Admission: RE | Admit: 2017-01-10 | Discharge: 2017-01-10 | Disposition: A | Discharge status: Home / Self Care | Payer: Medicare HMO | Source: Ambulatory Visit | Attending: Obstetrics and Gynecology | Admitting: Obstetrics and Gynecology

## 2017-01-10 ENCOUNTER — Ambulatory Visit: Payer: Medicare HMO

## 2017-01-10 DIAGNOSIS — N898 Other specified noninflammatory disorders of vagina: Secondary | ICD-10-CM

## 2017-01-10 DIAGNOSIS — Z113 Encounter for screening for infections with a predominantly sexual mode of transmission: Secondary | ICD-10-CM

## 2017-01-10 NOTE — Progress Notes (Signed)
Pt presented to have a urine pregnancy test and std screening. Pt reports that she is menopausal and has had her tubes removed due to ectopic pregnancies. She reports having bleeding recently. Advised patient that she should make an appointment to be seen for Post Menopausal Bleeding. Patient did self swab for bv, yeast, trich, gonorrhea and chlamydia. Advised her that results would be back in a few days. We will call her with any abnormals.

## 2017-01-15 ENCOUNTER — Telehealth: Payer: Self-pay | Admitting: Family Medicine

## 2017-01-15 LAB — CERVICOVAGINAL ANCILLARY ONLY
BACTERIAL VAGINITIS: NEGATIVE
CANDIDA VAGINITIS: NEGATIVE
Chlamydia: NEGATIVE
Neisseria Gonorrhea: NEGATIVE
Trichomonas: NEGATIVE
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
## 2017-01-16 ENCOUNTER — Telehealth: Payer: Self-pay | Admitting: *Deleted

## 2017-01-16 NOTE — Telephone Encounter (Signed)
Pt called requesting results of her wet prep. Informed patient that all of her results are negative. Patient voiced understanding and had no further questions.

## 2017-01-17 ENCOUNTER — Other Ambulatory Visit: Payer: Self-pay

## 2017-01-17 ENCOUNTER — Ambulatory Visit (INDEPENDENT_AMBULATORY_CARE_PROVIDER_SITE_OTHER): Payer: Medicare HMO | Admitting: Internal Medicine

## 2017-01-17 ENCOUNTER — Encounter: Payer: Self-pay | Admitting: Internal Medicine

## 2017-01-17 VITALS — BP 116/69 | HR 78 | Temp 98.1°F | Ht 65.5 in | Wt 155.6 lb

## 2017-01-17 DIAGNOSIS — B353 Tinea pedis: Secondary | ICD-10-CM | POA: Diagnosis not present

## 2017-01-17 DIAGNOSIS — Z833 Family history of diabetes mellitus: Secondary | ICD-10-CM | POA: Diagnosis not present

## 2017-01-17 DIAGNOSIS — Z8619 Personal history of other infectious and parasitic diseases: Secondary | ICD-10-CM | POA: Insufficient documentation

## 2017-01-17 DIAGNOSIS — M25512 Pain in left shoulder: Secondary | ICD-10-CM | POA: Diagnosis not present

## 2017-01-17 DIAGNOSIS — Z803 Family history of malignant neoplasm of breast: Secondary | ICD-10-CM

## 2017-01-17 DIAGNOSIS — L290 Pruritus ani: Secondary | ICD-10-CM | POA: Insufficient documentation

## 2017-01-17 DIAGNOSIS — Z8639 Personal history of other endocrine, nutritional and metabolic disease: Secondary | ICD-10-CM | POA: Diagnosis not present

## 2017-01-17 LAB — GLUCOSE, CAPILLARY: GLUCOSE-CAPILLARY: 100 mg/dL — AB (ref 65–99)

## 2017-01-17 LAB — POCT GLYCOSYLATED HEMOGLOBIN (HGB A1C): HEMOGLOBIN A1C: 5.7

## 2017-01-17 MED ORDER — TERBINAFINE 1 % EX GEL: CUTANEOUS | 1 refills | Status: DC

## 2017-01-17 NOTE — Patient Instructions (Signed)
For the feet, try the cream 1-2 times/daily. We will check you for diabetes, and if it's positive, we will call you. Otherwise, we will mail you a copy of your results.  We will schedule you for a mammogram.   Please bring back the worm sample, so we can figure out the right medication for you.

## 2017-01-17 NOTE — Progress Notes (Signed)
   CC: "worms crawling inside me"  HPI:  Nicole [name] is a 56 y.o. who presents today for "worms crawling inside me." Please see assessment & plan for status of chronic medical problems.   Past Medical History:  Diagnosis Date  . Anemia   . Depression    bipolar  . GERD (gastroesophageal reflux disease)   . History of migraine headaches    headaches reported as migraines  . Hypotension   . Obesity   . Palpitations    negative cardiac w/u per Dr. Verl Blalock 2/09  . Perimenopause   . Reflux esophagitis   . Sleep apnea    No CPAP  . UTI (urinary tract infection)     Review of Systems:  Please see each problem below for a pertinent review of systems.  Physical Exam:  Vitals:   01/17/17 1327  BP: 116/69  Pulse: 78  Temp: 98.1 F (36.7 C)  TempSrc: Oral  SpO2: 99%  Weight: 155 lb 9.6 oz (70.6 kg)  Height: 5' 5.5" (1.664 m)   Physical Exam  Constitutional: No distress.  HENT:  Head: Normocephalic and atraumatic.  Eyes: Conjunctivae are normal. No scleral icterus.  Pulmonary/Chest: Effort normal. No respiratory distress.  Musculoskeletal:  No bony deformity appreciated with palpation overlying the left clavicle.   Skin: She is not diaphoretic.  No swelling or mass appreciated with palpation overlying the left clavicle. Feet with hyperpigmented areas and old blisters.  Psychiatric:  Bright affect. Tangential thought process. Borderline pressured speech.   Assessment & Plan:   See Encounters Tab for problem based charting.  Patient discussed with Dr. Daryll Drown

## 2017-01-18 NOTE — Assessment & Plan Note (Addendum)
Assessment Over the last 4-5 months, she noticed a knot over her left clavicle. She is concerned about breast cancer because she has lost weight which is consistent with what we have recorded in our chart [155 lbs today, down from 186 lbs at 1 year ago, but back to where she was in 2013]. She attributes her weight loss to smoking 1 pack/2.5 days since the age of 54 though she quit for about 1.5 years and feels it would be worse without her frequent marijuana use. Her aunt was diagnosed with breast cancer in her 6s though she has no immediate family members with this diagnosis. She feels only an MRI can allay her anxiety.  I am unable to appreciate any swelling or deformity on exam and feel reassured she does not have lymphadenopathy in this area. She could have a fracture or bony overgrowth of this area, but as she is concerned about malignancy, she should undergo recommended screening as she is overdue.  Plan -Recommend she undergo screening mammography as it's the optimal imaging modality to which she agreed. As there was an order already in the chart, I did not order another one.

## 2017-01-18 NOTE — Assessment & Plan Note (Signed)
Assessment Upon consuming bread and yeast, she developed fungal infection of her feet which has not improved with nystatin cream. She has had these infections in the past, and multiple members of her family have diabetes. She would like to re-establish with podiatry and requests a referral. I do notice some areas of hyperpigmentation that look somewhat erythematous which may be amenable to a topical antifungal.  Plan -Prescribed terbinafine cream to be applied up to twice daily to affected areas -Referred to podiatry -Check A1c   ADDENDUM 01/18/2017  7:38 AM:  A1c 5.7, which makes diabetes less likely. Result conveyed to her before she left.

## 2017-01-18 NOTE — Assessment & Plan Note (Signed)
Assessment Over the last 4-5 months, she noticed a knot over her left clavicle. She is concerned about breast cancer because she has lost weight which is consistent with what we have recorded in our chart [155 lbs today, down from 186 lbs at 1 year ago, but back to where she was in 2013]. She attributes her weight loss to smoking 1 pack/2.5 days since the age of 32 though she quit for about 1.5 years and feels it would be worse without her frequent marijuana use. Her aunt was diagnosed with breast cancer in her 80s though she has no immediate family members with this diagnosis.  I am unable to appreciate any swelling or deformity on exam and feel reassured she does not have lymphadenopathy in this area. She could have a fracture or bony overgrowth of this area, but as she is concerned about malignancy, she should undergo recommended screening as she is overdue.  Plan -Recommend she undergo screening mammography to which she agreed

## 2017-01-18 NOTE — Assessment & Plan Note (Addendum)
Assessment [place] she recalls having pinworm infections as a child and states, "I can feel the worms crawling inside me." Over the last year, she feels the sensation mainly in the evening, especially in the peri-rectal area, and is associated with itching. She is requesting a prescription of an anti-helminth agent today.  I am little leery of prescribing a medication given her affect and speech pattern today and would prefer we confirm the diagnosis first, especially since the symptoms have persisted for 1 year and we are only now discussing therapy.  Plan -Recommended she try to provide [place] a sample using scotch tape wrapped around her finger to swap the peri-rectal area so we can analyze under the microscope and prescribe the appropriate medication

## 2017-01-21 NOTE — Progress Notes (Signed)
Internal Medicine Clinic Attending  Case discussed with Dr. Patel,Rushil at the time of the visit.  We reviewed the resident's history and exam and pertinent patient test results.  I agree with the assessment, diagnosis, and plan of care documented in the resident's note.  

## 2017-01-29 ENCOUNTER — Encounter: Payer: Self-pay | Admitting: *Deleted

## 2017-02-01 ENCOUNTER — Ambulatory Visit: Payer: Medicare HMO | Admitting: Obstetrics & Gynecology

## 2017-02-07 NOTE — Addendum Note (Signed)
Addended by: Riccardo Dubin on: 02/07/2017 08:55 AM   Modules accepted: Orders

## 2017-02-08 ENCOUNTER — Other Ambulatory Visit (HOSPITAL_COMMUNITY)
Admission: RE | Admit: 2017-02-08 | Discharge: 2017-02-08 | Disposition: A | Discharge status: Home / Self Care | Payer: Medicare HMO | Source: Ambulatory Visit | Attending: Obstetrics & Gynecology | Admitting: Obstetrics & Gynecology

## 2017-02-08 ENCOUNTER — Encounter: Payer: Self-pay | Admitting: Obstetrics & Gynecology

## 2017-02-08 ENCOUNTER — Ambulatory Visit (INDEPENDENT_AMBULATORY_CARE_PROVIDER_SITE_OTHER): Payer: Medicare HMO | Admitting: Obstetrics & Gynecology

## 2017-02-08 VITALS — BP 104/69 | HR 69 | Wt 152.3 lb

## 2017-02-08 DIAGNOSIS — Z113 Encounter for screening for infections with a predominantly sexual mode of transmission: Secondary | ICD-10-CM | POA: Diagnosis not present

## 2017-02-08 DIAGNOSIS — Z124 Encounter for screening for malignant neoplasm of cervix: Secondary | ICD-10-CM

## 2017-02-08 DIAGNOSIS — Z1151 Encounter for screening for human papillomavirus (HPV): Secondary | ICD-10-CM | POA: Insufficient documentation

## 2017-02-08 DIAGNOSIS — N950 Postmenopausal bleeding: Secondary | ICD-10-CM | POA: Diagnosis not present

## 2017-02-08 DIAGNOSIS — N898 Other specified noninflammatory disorders of vagina: Secondary | ICD-10-CM | POA: Diagnosis not present

## 2017-02-08 NOTE — Progress Notes (Signed)
GYNECOLOGY OFFICE VISIT NOTE  History:  56 y.o. [id number] here today for evaluation of PMB for the past 7-8 months. Was menopausal at age 43. Of note, patient was evaluated for this last year by Dr. Hulan Fray in this clinic, had benign endometrial biopsy in 06/2016, unremarkable ultrasound in 09/2016.  Feels the bleeding is from "a sore inside the vagina", associated with yellow discharge that is malodorous. She denies any pelvic pain or other concerns.  Just wants to make sure there is no "sore".  Of note, was seen by Internal Medicine service for mass near clavicle, mammogram was ordered, patient has not done it yet.   Past Medical History:  Diagnosis Date  . Anemia   . Depression    bipolar  . GERD (gastroesophageal reflux disease)   . History of migraine headaches    headaches reported as migraines  . Hypotension   . Obesity   . Palpitations    negative cardiac w/u per Dr. Verl Blalock 2/09  . Perimenopause   . Reflux esophagitis   . Sleep apnea    No CPAP  . UTI (urinary tract infection)     Past Surgical History:  Procedure Laterality Date  . COLONOSCOPY    . ECTOPIC PREGNANCY SURGERY    . EXPLORATORY LAPAROTOMY  2000   x2 (Dr. Kendall Flack)  . HERPES SIMPLEX VIRUS DFA    . TUBAL LIGATION  1987    The following portions of the patient's history were reviewed and updated as appropriate: allergies, current medications, past family history, past medical history, past social history, past surgical history and problem list.   Health Maintenance:  Normal pap on 09/30/2014.  Normal mammogram on 10/19/14.   Review of Systems:  Pertinent items noted in HPI and remainder of comprehensive ROS otherwise negative.   Objective:  Physical Exam BP 104/69   Pulse 69   Wt 152 lb 4.8 oz (69.1 kg)   LMP 03/03/2016 (Approximate)   BMI 24.96 kg/m   CONSTITUTIONAL: Well-developed, well-nourished female in no acute distress.  HENT:  Normocephalic, atraumatic, External right and left ear normal.  Oropharynx is clear and moist EYES: Conjunctivae and EOM are normal. Pupils are equal, round, and reactive to light. No scleral icterus.  NECK: Normal range of motion, supple, no masses.  Normal thyroid.  SKIN: Skin is warm and dry. No rash noted. Not diaphoretic. No erythema. No pallor. NEUROLOGIC: Alert and oriented to person, place, and time. Normal reflexes, muscle tone coordination. No cranial nerve deficit noted. PSYCHIATRIC: Normal mood and affect. Normal behavior. Normal judgment and thought content. CARDIOVASCULAR: Normal heart rate noted, regular rhythm RESPIRATORY: Clear to auscultation bilaterally. Effort and breath sounds normal, no problems with respiration noted. BREASTS: Symmetric in size. No masses, skin changes, nipple drainage, or lymphadenopathy. ABDOMEN: Soft, normal bowel sounds, no distention noted.  No tenderness, rebound or guarding.  PELVIC: Normal appearing external genitalia; normal appearing vaginal mucosa and cervix with some atrophy.  Scant white-yellow discharge noted, no odor.  No blood noted.  Pap smear obtained, patient declined endometrial biopsy.  Normal uterine size, no other palpable masses, no uterine or adnexal tenderness. MUSCULOSKELETAL: Normal range of motion. No edema noted.  Labs and Imaging 09/20/2016  TRANSABDOMINAL AND TRANSVAGINAL ULTRASOUND OF PELVIS CLINICAL DATA:  Patient with intermittent postmenopausal bleeding. COMPARISON:  None FINDINGS: Uterus  Measurements: 7.3 x 3.6 x 4.2 cm. No fibroids or other mass visualized. Endometrium  Thickness: 3 mm.  No focal abnormality visualized. Right ovary Measurements: 2.3 x  2.2 x 2.2 cm. Normal appearance/no adnexal mass. Left ovary Measurements: 2.6 x 1.9 x 1.4 cm. Normal appearance/no adnexal mass. No abnormal free fluid. IMPRESSION: Unremarkable pelvic ultrasound.  07/09/2016 Endometrium, biopsy - ENDOMETRIOID TYPE POLYP(S). - ATROPHIC APPEARING ENDOMETRIUM. - THERE IS NO EVIDENCE OF  HYPERPLASIA OR MALIGNANCY.  Assessment & Plan:  1. Post-menopausal bleeding Pap smear obtained, repeat endometrial biopsy vs D&C recommended, she declined. She was told that endometrial sampling was needed to rule out precancerous or cancerous lesions. She declined this today. Breast exam normal today, will call [facility] to schedule already ordered breast ultrasound.  Routine preventative health maintenance measures emphasized. Please refer to After Visit Summary for other counseling recommendations.   Return if symptoms worsen or fail to improve.   Total face-to-face time with patient: 25 minutes. Over 50% of encounter was spent on counseling and coordination of care.   Verita Schneiders, MD, [place] Attending [place], [facility] for [employer], [place]

## 2017-02-08 NOTE — Patient Instructions (Addendum)
Thank you for enrolling in [place]. Please follow the instructions below to securely access your online medical record. MyChart allows you to send messages to your doctor, view your test results, manage appointments, and more.   How Do I Sign Up? 1. In your Internet browser, go to [employer] and enter [email or web address]. 2. Click on the Sign Up Now link in the Sign In box. You will see the New Member Sign Up page. 3. Enter your MyChart Access Code exactly as it appears below. You will not need to use this code after you've completed the sign-up process. If you do not sign up before the expiration date, you must request a new code.  MyChart Access Code: [email or web address] Expires: 03/18/2017  2:10 PM  4. Enter your Social Security Number ([id number]) and Date of Birth (mm/dd/yyyy) as indicated and click Submit. You will be taken to the next sign-up page. 5. Create a MyChart ID. This will be your MyChart login ID and cannot be changed, so think of one that is secure and easy to remember. 6. Create a MyChart password. You can change your password at any time. 7. Enter your Password Reset Question and Answer. This can be used at a later time if you forget your password.  8. Enter your e-mail address. You will receive e-mail notification when new information is available in [place]. 9. Click Sign Up. You can now view your medical record.   Additional Information Remember, MyChart is NOT to be used for urgent needs. For medical emergencies, dial 911.      Postmenopausal Bleeding Postmenopausal bleeding is any bleeding a woman has after she has entered into menopause. Menopause is the end of a woman's fertile years. After menopause, a woman no longer ovulates or has menstrual periods. Postmenopausal bleeding can be caused by various things. Any type of postmenopausal bleeding, even if it appears to be a typical menstrual period, is concerning. This should be evaluated by your  health care provider. Any treatment will depend on the cause of the bleeding. Follow these instructions at home: Monitor your condition for any changes. The following actions may help to alleviate any discomfort you are experiencing:  Avoid the use of tampons and douches as directed by your health care provider.  Change your pads frequently.  Get regular pelvic exams and Pap tests.  Keep all follow-up appointments for diagnostic tests as directed by your health care provider.  Contact a health care provider if:  Your bleeding lasts more than 1 week.  You have abdominal pain.  You have bleeding with sexual intercourse. Get help right away if:  You have a fever, chills, headache, dizziness, muscle aches, and bleeding.  You have severe pain with bleeding.  You are passing blood clots.  You have bleeding and need more than 1 pad an hour.  You feel faint. This information is not intended to replace advice given to you by your health care provider. Make sure you discuss any questions you have with your health care provider. Document Released: 11/14/2005 Document Revised: 01/12/2016 Document Reviewed: 03/05/2013 Elsevier Interactive Patient Education  [employer].

## 2017-02-12 LAB — CYTOLOGY - PAP
Bacterial vaginitis: NEGATIVE
CHLAMYDIA, DNA PROBE: NEGATIVE
Candida vaginitis: NEGATIVE
Diagnosis: NEGATIVE
HPV: NOT DETECTED
NEISSERIA GONORRHEA: NEGATIVE
TRICH (WINDOWPATH): NEGATIVE

## 2017-03-11 ENCOUNTER — Other Ambulatory Visit: Payer: Self-pay | Admitting: Internal Medicine

## 2017-03-11 ENCOUNTER — Encounter: Payer: Self-pay | Admitting: Internal Medicine

## 2017-03-11 ENCOUNTER — Ambulatory Visit: Payer: Medicaid Other

## 2017-03-11 ENCOUNTER — Telehealth: Payer: Self-pay | Admitting: *Deleted

## 2017-03-11 DIAGNOSIS — Z1231 Encounter for screening mammogram for malignant neoplasm of breast: Secondary | ICD-10-CM

## 2017-03-11 NOTE — Telephone Encounter (Signed)
Pt calls and states nodule on L front neck is increasing in size also has nodule on L arm now States for 2 days her swallowing is bothered States for several weeks her breathing is uncomfortable She is concerned that something strange is happening No distress noted in ph conversation, able to complete several sentences without noted shortness of breath. appt [facility] today 7/23 at 1515

## 2017-03-14 ENCOUNTER — Ambulatory Visit (INDEPENDENT_AMBULATORY_CARE_PROVIDER_SITE_OTHER): Payer: Medicare HMO | Admitting: Internal Medicine

## 2017-03-14 ENCOUNTER — Other Ambulatory Visit: Payer: Self-pay | Admitting: Oncology

## 2017-03-14 VITALS — BP 95/63 | HR 67 | Temp 98.3°F | Ht 65.5 in | Wt 151.4 lb

## 2017-03-14 DIAGNOSIS — M25512 Pain in left shoulder: Secondary | ICD-10-CM | POA: Diagnosis not present

## 2017-03-14 NOTE — Patient Instructions (Signed)
Thank you for coming in today [name].  We don't see any concerning signs on your exam today but we will check some routine lab work to be sure and get some more images of the area. We will let you know if anything comes back as abnormal. It may be that the joint where collarbone and breastbone me is inflamed. To help decrease the inflammation you can try using Tylenol or ibuprofen. He can take 600 mg every 6 hours as you need.

## 2017-03-14 NOTE — Progress Notes (Signed)
   CC: Lump on L collarbone  HPI:  [name] is a 57 y.o. female with past medical history as described below presents to the clinic with complaints of a lump over her left collarbone.  Patient reports an ongoing history of swelling and pain over her left collarbone. She feels this is increasing in size over the last 2-1/2 months. She experiences intermittent pain 3-5 times a day which lasts about 30 seconds to 1 minute. She has previously been seen for this complaint most recently in June. In December she had a normal point-of-care ultrasound. It was felt to be inflammation of the sternoclavicular joint.  She seems to be concerned with malignancy as an explanation for this issue, as well as weight loss. One year ago she weighed 184 pounds and is currently at 151 pounds. She was initially attempting to lose weight, but feels she has still been losing without effort. She also notes a history of night sweats, she previously attributed this to menopause.  Past Medical History:  Diagnosis Date  . Anemia   . Depression    bipolar  . GERD (gastroesophageal reflux disease)   . History of migraine headaches    headaches reported as migraines  . Hypotension   . Obesity   . Palpitations    negative cardiac w/u per Dr. Verl Blalock 2/09  . Perimenopause   . Reflux esophagitis   . Sleep apnea    No CPAP  . UTI (urinary tract infection)    Review of Systems:  Review of Systems  Constitutional: Negative for fever.  Cardiovascular: Negative for chest pain.  Musculoskeletal: Positive for joint pain.     Physical Exam:  Vitals:   03/14/17 1338  BP: 95/63  Pulse: 67  Temp: 98.3 F (36.8 C)  TempSrc: Oral  SpO2: 100%  Weight: 151 lb 6.4 oz (68.7 kg)  Height: 5' 5.5" (1.664 m)   Physical Exam  Constitutional: She appears well-developed and well-nourished.  Neck: Neck supple. No thyromegaly present.  Cardiovascular: Normal rate, regular rhythm and intact distal pulses.   Pulmonary/Chest:  Effort normal and breath sounds normal. No respiratory distress. She has no wheezes.  Abdominal: Soft.  No hepatosplenomegaly  Musculoskeletal:  Slight fullness to the left sternoclavicular joint compared to right, tenderness to palpation over area, no bony tenderness of clavicle or left shoulder. No supra or infraclavicular lymphadenopathy, no axillary lymphadenopathy  Lymphadenopathy:    She has no cervical adenopathy.  Skin: Skin is warm and dry. Capillary refill takes less than 2 seconds.    Assessment & Plan:   See Encounters Tab for problem based charting.  Patient seen with Dr. Beryle Beams

## 2017-03-14 NOTE — Assessment & Plan Note (Signed)
Nicole [name] has had about a year history of reported swelling over her left sternoclavicular joint. Today on exam there is a slight asymmetry and tenderness to palpation of the joint. She had a normal chest x-ray in March, with no abnormality of the bony structures. It is likely inflammation of this joint, or a similar benign process. Will order limited CT to include a view of the joint for more complete evaluation.   She is concerned this area may be due to malignancy due to her weight loss. She does report a history of night sweats, unclear of the severity of these. She also notes intermittently staying at a homeless shelter within the last year. Her last CBC had a borderline normocytic anemia, otherwise normal. She has no lymphadenopathy. We again reassured her that these more systemic symptoms are unlikely to be related to malignancy. We will repeat blood work today including a Financial [employer] (homeless shelter exposure) and sedimentation rate. She has a screening mammogram scheduled tomorrow to be up to date on age-appropriate cancer screening.

## 2017-03-15 ENCOUNTER — Other Ambulatory Visit: Payer: Self-pay | Admitting: Internal Medicine

## 2017-03-15 ENCOUNTER — Ambulatory Visit
Admission: RE | Admit: 2017-03-15 | Discharge: 2017-03-15 | Disposition: A | Discharge status: Home / Self Care | Payer: Medicare HMO | Source: Ambulatory Visit | Attending: Internal Medicine | Admitting: Internal Medicine

## 2017-03-15 DIAGNOSIS — N644 Mastodynia: Secondary | ICD-10-CM

## 2017-03-15 DIAGNOSIS — Z1231 Encounter for screening mammogram for malignant neoplasm of breast: Secondary | ICD-10-CM

## 2017-03-15 LAB — BMP8+ANION GAP
Anion Gap: 16 mmol/L (ref 10.0–18.0)
BUN/Creatinine Ratio: 14 (ref 9–23)
BUN: 12 mg/dL (ref 6–24)
CO2: 23 mmol/L (ref 20–29)
Calcium: 9.3 mg/dL (ref 8.7–10.2)
Chloride: 103 mmol/L (ref 96–106)
Creatinine, Ser: 0.85 mg/dL (ref 0.57–1.00)
GFR, EST AFRICAN AMERICAN: 88 mL/min/{1.73_m2} (ref >59)
GFR, EST NON AFRICAN AMERICAN: 76 mL/min/{1.73_m2} (ref >59)
GLUCOSE: 84 mg/dL (ref 65–99)
POTASSIUM: 4.3 mmol/L (ref 3.5–5.2)
Sodium: 142 mmol/L (ref 134–144)

## 2017-03-15 LAB — CBC WITH DIFFERENTIAL/PLATELET
BASOS ABS: 0 10*3/uL (ref 0.0–0.2)
BASOS: 0 %
EOS (ABSOLUTE): 0.2 10*3/uL (ref 0.0–0.4)
Eos: 3 %
Hematocrit: 35.9 % (ref 34.0–46.6)
Hemoglobin: 11.9 g/dL (ref 11.1–15.9)
Immature Grans (Abs): 0 10*3/uL (ref 0.0–0.1)
Immature Granulocytes: 0 %
Lymphocytes Absolute: 2.4 10*3/uL (ref 0.7–3.1)
Lymphs: 48 %
MCH: 30.7 pg (ref 26.6–33.0)
MCHC: 33.1 g/dL (ref 31.5–35.7)
MCV: 93 fL (ref 79–97)
MONOS ABS: 0.3 10*3/uL (ref 0.1–0.9)
Monocytes: 6 %
NEUTROS ABS: 2.2 10*3/uL (ref 1.4–7.0)
Neutrophils: 43 %
PLATELETS: 218 10*3/uL (ref 150–379)
RBC: 3.87 x10E6/uL (ref 3.77–5.28)
RDW: 13.8 % (ref 12.3–15.4)
WBC: 5.1 10*3/uL (ref 3.4–10.8)

## 2017-03-15 LAB — SEDIMENTATION RATE: Sed Rate: 3 mm/hr (ref 0–40)

## 2017-03-15 NOTE — Progress Notes (Signed)
Medicine attending: I personally interviewed and briefly examined this patient on the day of the patient visit and reviewed pertinent clinical ,laboratory, and radiographic data  with resident physician Dr. Tawny Asal and we discussed a management plan. Persistent pain at left sternoclavicular joint. No abnormal findings on exam, recent CXR, or previous US neck. Low suspicion for significant pathology. Pt adamant that there is something wrong. We will get a limited CT with bone windows for further eval.

## 2017-03-18 LAB — QUANTIFERON IN TUBE
QFT TB AG MINUS NIL VALUE: 0 IU/mL
QUANTIFERON NIL VALUE: 0.09 [IU]/mL
QUANTIFERON TB AG VALUE: 0.09 IU/mL
QUANTIFERON TB GOLD: NEGATIVE

## 2017-03-18 LAB — QUANTIFERON TB GOLD ASSAY (BLOOD)

## 2017-03-19 NOTE — Addendum Note (Signed)
Addended by: Yvonna Alanis E on: 03/19/2017 06:30 PM   Modules accepted: Orders

## 2017-04-08 ENCOUNTER — Ambulatory Visit (HOSPITAL_COMMUNITY): Payer: Medicare HMO | Attending: Internal Medicine

## 2017-04-15 ENCOUNTER — Ambulatory Visit (HOSPITAL_COMMUNITY)
Admission: RE | Admit: 2017-04-15 | Discharge: 2017-04-15 | Disposition: A | Discharge status: Home / Self Care | Payer: Medicare HMO | Source: Ambulatory Visit | Attending: Internal Medicine | Admitting: Internal Medicine

## 2017-04-15 DIAGNOSIS — M25512 Pain in left shoulder: Secondary | ICD-10-CM | POA: Insufficient documentation

## 2017-04-15 DIAGNOSIS — R918 Other nonspecific abnormal finding of lung field: Secondary | ICD-10-CM | POA: Diagnosis not present

## 2017-04-19 ENCOUNTER — Ambulatory Visit (INDEPENDENT_AMBULATORY_CARE_PROVIDER_SITE_OTHER): Payer: Medicare HMO | Admitting: Internal Medicine

## 2017-04-19 VITALS — BP 111/67 | HR 69 | Temp 98.2°F | Ht 65.0 in | Wt 149.2 lb

## 2017-04-19 DIAGNOSIS — R892 Abnormal level of other drugs, medicaments and biological substances in specimens from other organs, systems and tissues: Secondary | ICD-10-CM | POA: Diagnosis not present

## 2017-04-19 DIAGNOSIS — M25512 Pain in left shoulder: Secondary | ICD-10-CM | POA: Diagnosis not present

## 2017-04-19 DIAGNOSIS — N63 Unspecified lump in unspecified breast: Secondary | ICD-10-CM

## 2017-04-19 DIAGNOSIS — F17210 Nicotine dependence, cigarettes, uncomplicated: Secondary | ICD-10-CM

## 2017-04-19 DIAGNOSIS — Z1231 Encounter for screening mammogram for malignant neoplasm of breast: Secondary | ICD-10-CM | POA: Insufficient documentation

## 2017-04-19 MED ORDER — DICLOFENAC SODIUM 1 % TD GEL
2.0000 g | Freq: Four times a day (QID) | TRANSDERMAL | 5 refills | Status: AC
Start: 2017-04-19 — End: 2017-07-18

## 2017-04-19 NOTE — Progress Notes (Signed)
   CC: Follow-up on CT Chest  HPI:  [name] is a 56 y.o. female previously evaluated on 7/26 for left sternoclavicular joint pain. At the times it was felt this could be due to arthritis of the joint, however she had a positive history for being homeless, night sweets, and 30 lbs weight loss in 12 months. A CT of the chest was order along with a serum quantiferon to rule out TB or lung malignancy. She is presenting today for follow-up.   Her CT chest illustrated no acute lung pathology and no mass in the left sternoclavicular joint. It did illustrate mild degenerative changes of the left sternoclavicular joint. Her quantiferon results were negative. She is still experiencing some weight loss and pain in the region. However, upon further discussion she states that she does not eat a lot of food and the weight loss has not been completely unintentional.   She also voices concerns about a left breast nodule. She was recently evaluated and sent for a diagnostic mammogram and ultrasound but was told at her appointment that the tests could not be completed. Unable to find documentation in the chart.   Reports positive UDS for PCP within the past 1 week. States that she does not use PCP. She only uses marijuana on a daily basis. Requesting another UDS.   Past Medical History:  Diagnosis Date  . Anemia   . Depression    bipolar  . GERD (gastroesophageal reflux disease)   . History of migraine headaches    headaches reported as migraines  . Hypotension   . Obesity   . Palpitations    negative cardiac w/u per Dr. Verl Blalock 2/09  . Perimenopause   . Reflux esophagitis   . Sleep apnea    No CPAP  . UTI (urinary tract infection)    Review of Systems  Constitutional: Positive for weight loss. Negative for fever and malaise/fatigue.  HENT: Negative.   Eyes: Negative.   Respiratory: Positive for cough. Negative for hemoptysis and sputum production.   Cardiovascular: Negative.     Gastrointestinal: Negative.   Genitourinary: Negative.   Musculoskeletal: Positive for joint pain (left sternoclavicular).  Skin: Negative.   Neurological: Negative for weakness.  Psychiatric/Behavioral: Positive for depression and substance abuse. Negative for suicidal ideas.   Physical Exam: Vitals:   04/19/17 1000  BP: 111/67  Pulse: 69  Temp: 98.2 F (36.8 C)  TempSrc: Oral  SpO2: 100%  Weight: 149 lb 3.2 oz (67.7 kg)  Height: 5\' 5"  (1.651 m)   Physical Exam  Constitutional: She is oriented to person, place, and time. She appears well-developed and well-nourished.  HENT:  Head: Normocephalic and atraumatic.  Eyes: Pupils are equal, round, and reactive to light. Conjunctivae are normal.  Cardiovascular: Normal rate, regular rhythm, normal heart sounds and intact distal pulses.   Pulmonary/Chest: Effort normal and breath sounds normal.  Abdominal: Soft. Bowel sounds are normal.  Musculoskeletal: Normal range of motion. She exhibits no edema.  Left sternoclavicular joint enlargement, rubbery feeling  Neurological: She is alert and oriented to person, place, and time.   Assessment & Plan:   See Encounters Tab for problem based charting.  Patient seen with Dr. Angelia Mould

## 2017-04-19 NOTE — Patient Instructions (Signed)
It was a pleasure to meet you. Today we:  - Referred you for diagnostic breast mammogram and ultrasound   - Sent out a prescription for the voltaren gel   - I will call you in 1 week with your test results.

## 2017-04-19 NOTE — Assessment & Plan Note (Signed)
Self reports positive UDS for PCP. States that she does not use PCP, only marijuana. Requesting another UDS.   Plan: - UDS

## 2017-04-19 NOTE — Assessment & Plan Note (Signed)
Breast nodule in the LUQ on the left breast. States that she was not able to be evaluated at prior appointment. Unable to find supporting documentation. Will put in for diagnostic mammogram of the left breast and ultrasound.   Plan: - Diagnostic mammogram left breast  - Left breast ultrasound

## 2017-04-19 NOTE — Assessment & Plan Note (Signed)
Continues to have pain at the left sternoclavicular joint. PE consistent with a synovitis. CT and negative quantiferon is reassuring. Weight loss is partially intentional. Previous labs reviewed with no anemia noted. Likely due to arthritis.  Plan: - Voltaren gel  - Continue to monitor

## 2017-04-22 NOTE — Progress Notes (Signed)
Internal Medicine Clinic Attending  I saw and evaluated the patient.  I personally confirmed the key portions of the history and exam documented by Dr. Helberg and I reviewed pertinent patient test results.  The assessment, diagnosis, and plan were formulated together and I agree with the documentation in the resident's note. 

## 2017-04-26 ENCOUNTER — Telehealth: Payer: Self-pay | Admitting: Internal Medicine

## 2017-04-26 NOTE — Telephone Encounter (Signed)
   Reason for call:   I received a call from [name] at 4:30 PM indicating that she is requesting the results of her recent drug screen.   Pertinent Data:   Patient seen in [facility] on 04/19/17. She self-reported a positive UDS for PCP which she believed to be a false positive and requested another drug screen.  [employer] collected 04/19/17 has not resulted.   Assessment / Plan / Recommendations:   Patient notified that her [employer] screen has not resulted. She will call back next week. Will forward note to ordering provider, Dr. Tarri Abernethy as well.  As always, pt is advised that if symptoms worsen or new symptoms arise, they should go to an urgent care facility or to to ER for further evaluation.   Zada Finders, MD   04/26/2017, 4:36 PM

## 2017-04-29 LAB — TOXASSURE SELECT,+ANTIDEPR,UR

## 2017-04-29 NOTE — Addendum Note (Signed)
Addended by: Truddie Crumble on: 04/29/2017 08:45 AM   Modules accepted: Orders

## 2017-05-01 ENCOUNTER — Telehealth: Payer: Self-pay

## 2017-05-01 NOTE — Telephone Encounter (Signed)
Requesting test results. Please call pt back.  

## 2017-05-01 NOTE — Telephone Encounter (Signed)
Pt would like results of labs, ph [phone]

## 2017-05-02 ENCOUNTER — Telehealth: Payer: Self-pay | Admitting: Internal Medicine

## 2017-05-02 NOTE — Telephone Encounter (Signed)
Called today. Thank you

## 2017-05-02 NOTE — Telephone Encounter (Signed)
STILL WAITING TO HEAR TEST RESULTS, PLEASE CALL

## 2017-05-06 ENCOUNTER — Other Ambulatory Visit: Payer: Medicare HMO

## 2017-05-09 ENCOUNTER — Ambulatory Visit: Payer: Medicare HMO

## 2017-05-09 ENCOUNTER — Ambulatory Visit: Admission: RE | Admit: 2017-05-09 | Payer: Medicare HMO | Source: Ambulatory Visit

## 2017-05-09 ENCOUNTER — Ambulatory Visit
Admission: RE | Admit: 2017-05-09 | Discharge: 2017-05-09 | Disposition: A | Discharge status: Home / Self Care | Payer: Medicare HMO | Source: Ambulatory Visit | Attending: Internal Medicine | Admitting: Internal Medicine

## 2017-05-09 DIAGNOSIS — R928 Other abnormal and inconclusive findings on diagnostic imaging of breast: Secondary | ICD-10-CM | POA: Diagnosis not present

## 2017-05-09 DIAGNOSIS — N644 Mastodynia: Secondary | ICD-10-CM

## 2017-05-10 ENCOUNTER — Ambulatory Visit (INDEPENDENT_AMBULATORY_CARE_PROVIDER_SITE_OTHER): Payer: Medicare HMO | Admitting: Internal Medicine

## 2017-05-10 VITALS — BP 107/66 | HR 73 | Temp 97.9°F | Ht 65.5 in | Wt 147.7 lb

## 2017-05-10 DIAGNOSIS — N63 Unspecified lump in unspecified breast: Secondary | ICD-10-CM | POA: Diagnosis not present

## 2017-05-10 DIAGNOSIS — N390 Urinary tract infection, site not specified: Secondary | ICD-10-CM

## 2017-05-10 DIAGNOSIS — Z1231 Encounter for screening mammogram for malignant neoplasm of breast: Secondary | ICD-10-CM | POA: Diagnosis not present

## 2017-05-10 DIAGNOSIS — Z87440 Personal history of urinary (tract) infections: Secondary | ICD-10-CM

## 2017-05-10 MED ORDER — NITROFURANTOIN MONOHYD MACRO 100 MG PO CAPS: 100.0000 mg | ORAL_CAPSULE | Freq: Two times a day (BID) | ORAL | 0 refills | Status: AC

## 2017-05-10 MED ORDER — PHENAZOPYRIDINE HCL 200 MG PO TABS: 200.0000 mg | ORAL_TABLET | Freq: Three times a day (TID) | ORAL | 0 refills | Status: DC | PRN

## 2017-05-10 NOTE — Progress Notes (Signed)
   CC: UTI  HPI:  [name] is a 56 y.o. with past medical history as documented below presenting with the chief complaint of a urinary tract infection. She states for the past two days she has been experiencing urgency, frequency, and dysuria. She also said her urine had a foul odor. She denies gross hematuria. She has had UTIs in the past and these symptoms feel similar to prior UTIs. She denies nausea, vomiting, fever, or chills.   Past Medical History:  Diagnosis Date  . Anemia   . Depression    bipolar  . GERD (gastroesophageal reflux disease)   . History of migraine headaches    headaches reported as migraines  . Hypotension   . Obesity   . Palpitations    negative cardiac w/u per Dr. Verl Blalock 2/09  . Perimenopause   . Reflux esophagitis   . Sleep apnea    No CPAP  . UTI (urinary tract infection)    Review of Systems:   Review of Systems  Constitutional: Negative for chills and fever.  Gastrointestinal: Negative for nausea and vomiting.  Genitourinary: Positive for dysuria, frequency and urgency. Negative for hematuria.  Musculoskeletal: Negative for myalgias.  Neurological: Negative for headaches.    Physical Exam:  Vitals:   05/10/17 1323  BP: 107/66  Pulse: 73  Temp: 97.9 F (36.6 C)  TempSrc: Oral  SpO2: 100%  Weight: 147 lb 11.2 oz (67 kg)  Height: 5' 5.5" (1.664 m)   Physical Exam  Constitutional: She is oriented to person, place, and time. She appears well-developed and well-nourished.  Non-toxic appearance.  HENT:  Head: Normocephalic and atraumatic.  Eyes: Conjunctivae and EOM are normal.  Cardiovascular: Normal rate, regular rhythm and normal heart sounds.   Pulmonary/Chest: Effort normal and breath sounds normal.  Abdominal: Soft. Bowel sounds are normal. There is no tenderness. There is no CVA tenderness.  Musculoskeletal: Normal range of motion. She exhibits no edema.  Neurological: She is alert and oriented to person, place, and time.    Skin: Skin is warm and dry.    Assessment & Plan:   See Encounters Tab for problem based charting.  Patient seen with Dr. Evette Doffing

## 2017-05-10 NOTE — Assessment & Plan Note (Signed)
Mammogram completed. Results as below:   FINDINGS: There are no discrete masses, areas of architectural distortion, areas of significant asymmetry or suspicious calcifications. No mammographic change.  IMPRESSION: Normal exam.  No evidence of malignancy.

## 2017-05-10 NOTE — Assessment & Plan Note (Addendum)
Patient's symptoms and UA consistent with urinary tract infection. Urine positive for nitrites, leukocytes, and blood. No CVA tenderness. No fevers, chills, nausea, or vomiting. Previous UTI in 10/2016 was + for E. Coli, which was sensitive to most antibiotics, including bactrim and macrobid.   Plan: -Macrobid 100 mg BID -Pyridium up to three times daily for pain -Advised patient she will most likely only need the pyridium for one day and to stop taking it after 1 day of antibiotics -RTC if symptoms worsen or fail to improve  -Urine culture

## 2017-05-10 NOTE — Patient Instructions (Signed)
Ms. Sabella,   Your urine was positive for signs of infection.   I have prescribed you Macrobid 100 mg. Take this pill twice daily by mouth. Drink plenty of water while on this medication.   If your symptoms do not resolve after you complete the antibiotic course, please call the clinic.

## 2017-05-12 LAB — URINE CULTURE

## 2017-05-13 NOTE — Progress Notes (Signed)
Internal Medicine Clinic Attending  I saw and evaluated the patient.  I personally confirmed the key portions of the history and exam documented by Dr. LaCroce and I reviewed pertinent patient test results.  The assessment, diagnosis, and plan were formulated together and I agree with the documentation in the resident's note.  

## 2017-07-10 DIAGNOSIS — F319 Bipolar disorder, unspecified: Secondary | ICD-10-CM | POA: Diagnosis not present

## 2017-07-14 DIAGNOSIS — R109 Unspecified abdominal pain: Secondary | ICD-10-CM | POA: Diagnosis not present

## 2017-07-14 DIAGNOSIS — R110 Nausea: Secondary | ICD-10-CM | POA: Diagnosis not present

## 2017-07-14 DIAGNOSIS — R1031 Right lower quadrant pain: Secondary | ICD-10-CM | POA: Diagnosis not present

## 2017-08-06 ENCOUNTER — Ambulatory Visit (INDEPENDENT_AMBULATORY_CARE_PROVIDER_SITE_OTHER): Payer: Medicare HMO | Admitting: Internal Medicine

## 2017-08-06 ENCOUNTER — Encounter: Payer: Self-pay | Admitting: Internal Medicine

## 2017-08-06 ENCOUNTER — Encounter (INDEPENDENT_AMBULATORY_CARE_PROVIDER_SITE_OTHER): Payer: Self-pay

## 2017-08-06 VITALS — BP 121/68 | HR 74 | Temp 98.0°F | Wt 156.5 lb

## 2017-08-06 DIAGNOSIS — F17210 Nicotine dependence, cigarettes, uncomplicated: Secondary | ICD-10-CM

## 2017-08-06 DIAGNOSIS — B9789 Other viral agents as the cause of diseases classified elsewhere: Principal | ICD-10-CM

## 2017-08-06 DIAGNOSIS — J069 Acute upper respiratory infection, unspecified: Secondary | ICD-10-CM

## 2017-08-06 NOTE — Progress Notes (Signed)
   CC: Cough, Congestion  HPI:  [name] is a 56 y.o. F with PMHx listed below presenting for Cough, Congestion. Please see the A&P for the status of the patient's chronic medical problems.  Patient reports cough, rhinorrhea, and sore throat starting 7 days ago. The sore throat improved after 2 days. The cough is productive of light yellow to clear sputum and is worse at night (but does not keep her awake). She has taken cough drops and Nyquil for her symptoms, which have provided relief. Patient's recent sick contact include her significant other who was experiencing similar symptoms and fellow resident's at a women's shelter where she has been stay recently.  She states she has been staying at the women's shelter due to being homeless for the past year. However, she is moving into an apartment on Friday; where she states she will feel safe. The apartment will be furnished and she will be provided with some groceries as well.  Past Medical History:  Diagnosis Date  . Anemia   . Depression    bipolar  . GERD (gastroesophageal reflux disease)   . History of migraine headaches    headaches reported as migraines  . Hypotension   . Obesity   . Palpitations    negative cardiac w/u per Dr. Verl Blalock 2/09  . Perimenopause   . Reflux esophagitis   . Sleep apnea    No CPAP  . UTI (urinary tract infection)    Review of Systems: Performed and negative except as otherwise indicated.  Physical Exam:  Vitals:   08/06/17 1012  BP: 121/68  Pulse: 74  Temp: 98 F (36.7 C)  TempSrc: Oral  SpO2: 100%  Weight: 156 lb 8 oz (71 kg)   Physical Exam  Constitutional: She appears well-developed and well-nourished.  HENT:  Mouth/Throat: Oropharynx is clear and moist.  Cardiovascular: Normal rate, regular rhythm, normal heart sounds and intact distal pulses.  Pulmonary/Chest: Effort normal and breath sounds normal. No respiratory distress.  Abdominal: Soft. Bowel sounds are normal. She exhibits  no distension. There is no tenderness.     Assessment & Plan:   See Encounters Tab for problem based charting.  Patient seen with Dr. Lynnae January

## 2017-08-06 NOTE — Patient Instructions (Addendum)
Thank you for allowing [place] to care for you:  Congratulations on your new apartment!  For your cough and congestion: - Symptoms are likely due to a viral illness - Continue supportive care with over the counter medication or cough drops as needed - Cough may continue for 2-3 weeks  Return to clinic if your symptoms worsen.

## 2017-08-06 NOTE — Assessment & Plan Note (Signed)
Patient reports cough, rhinorrhea, and sore throat starting 7 days ago. The sore throat improved after 2 days. The cough is productive of light yellow to clear sputum and is worse at night (but does not keep her awake). She has taken cough drops and Nyquil for her symptoms, which have provided relief. Patient's recent sick contact include her significant other who was experiencing similar symptoms and fellow resident's at a women's shelter where she has been stay recently. She denies fever, chills, or dyspnea.  Patient's exam is normal and symptoms are improving. Suspect viral URI. - Continue supportive care and OTC cough/cold medicine as needed - Return precautions given

## 2017-08-06 NOTE — Progress Notes (Signed)
Internal Medicine Clinic Attending  I saw and evaluated the patient.  I personally confirmed the key portions of the history and exam documented by Dr. Melvin and I reviewed pertinent patient test results.  The assessment, diagnosis, and plan were formulated together and I agree with the documentation in the resident's note.  

## 2017-08-08 ENCOUNTER — Other Ambulatory Visit (HOSPITAL_COMMUNITY)
Admission: RE | Admit: 2017-08-08 | Discharge: 2017-08-08 | Disposition: A | Discharge status: Home / Self Care | Payer: Medicare HMO | Source: Ambulatory Visit | Attending: Obstetrics and Gynecology | Admitting: Obstetrics and Gynecology

## 2017-08-08 ENCOUNTER — Ambulatory Visit: Payer: Medicare HMO | Admitting: *Deleted

## 2017-08-08 DIAGNOSIS — N898 Other specified noninflammatory disorders of vagina: Secondary | ICD-10-CM | POA: Diagnosis not present

## 2017-08-08 MED ORDER — FLUCONAZOLE 150 MG PO TABS: 150.0000 mg | ORAL_TABLET | Freq: Once | ORAL | 0 refills | Status: AC

## 2017-08-08 NOTE — Progress Notes (Signed)
Presents to clinic for self swab d/t vaginal itching. Instructed her on performing swab, she provided specimen. D/t possibility she may not get results till after the holiday I sent diflucan to her pharmacy and instructed her to let us know if it didn't help. Understanding voiced.

## 2017-08-09 LAB — CERVICOVAGINAL ANCILLARY ONLY
Bacterial vaginitis: NEGATIVE
CANDIDA VAGINITIS: NEGATIVE
CHLAMYDIA, DNA PROBE: NEGATIVE
Neisseria Gonorrhea: NEGATIVE
TRICH (WINDOWPATH): NEGATIVE

## 2017-08-30 ENCOUNTER — Ambulatory Visit: Payer: Medicare HMO

## 2017-08-30 ENCOUNTER — Encounter: Payer: Self-pay | Admitting: Internal Medicine

## 2017-09-02 IMAGING — CR DG CHEST 2V
2 series · 2 of 2 positions shown · non-contrast
Comparison: 03/27/2014

CLINICAL DATA: Chest pain for 3 weeks.  Left-sided chest pain.

EXAM:
CHEST  2 VIEW

[chest pa]
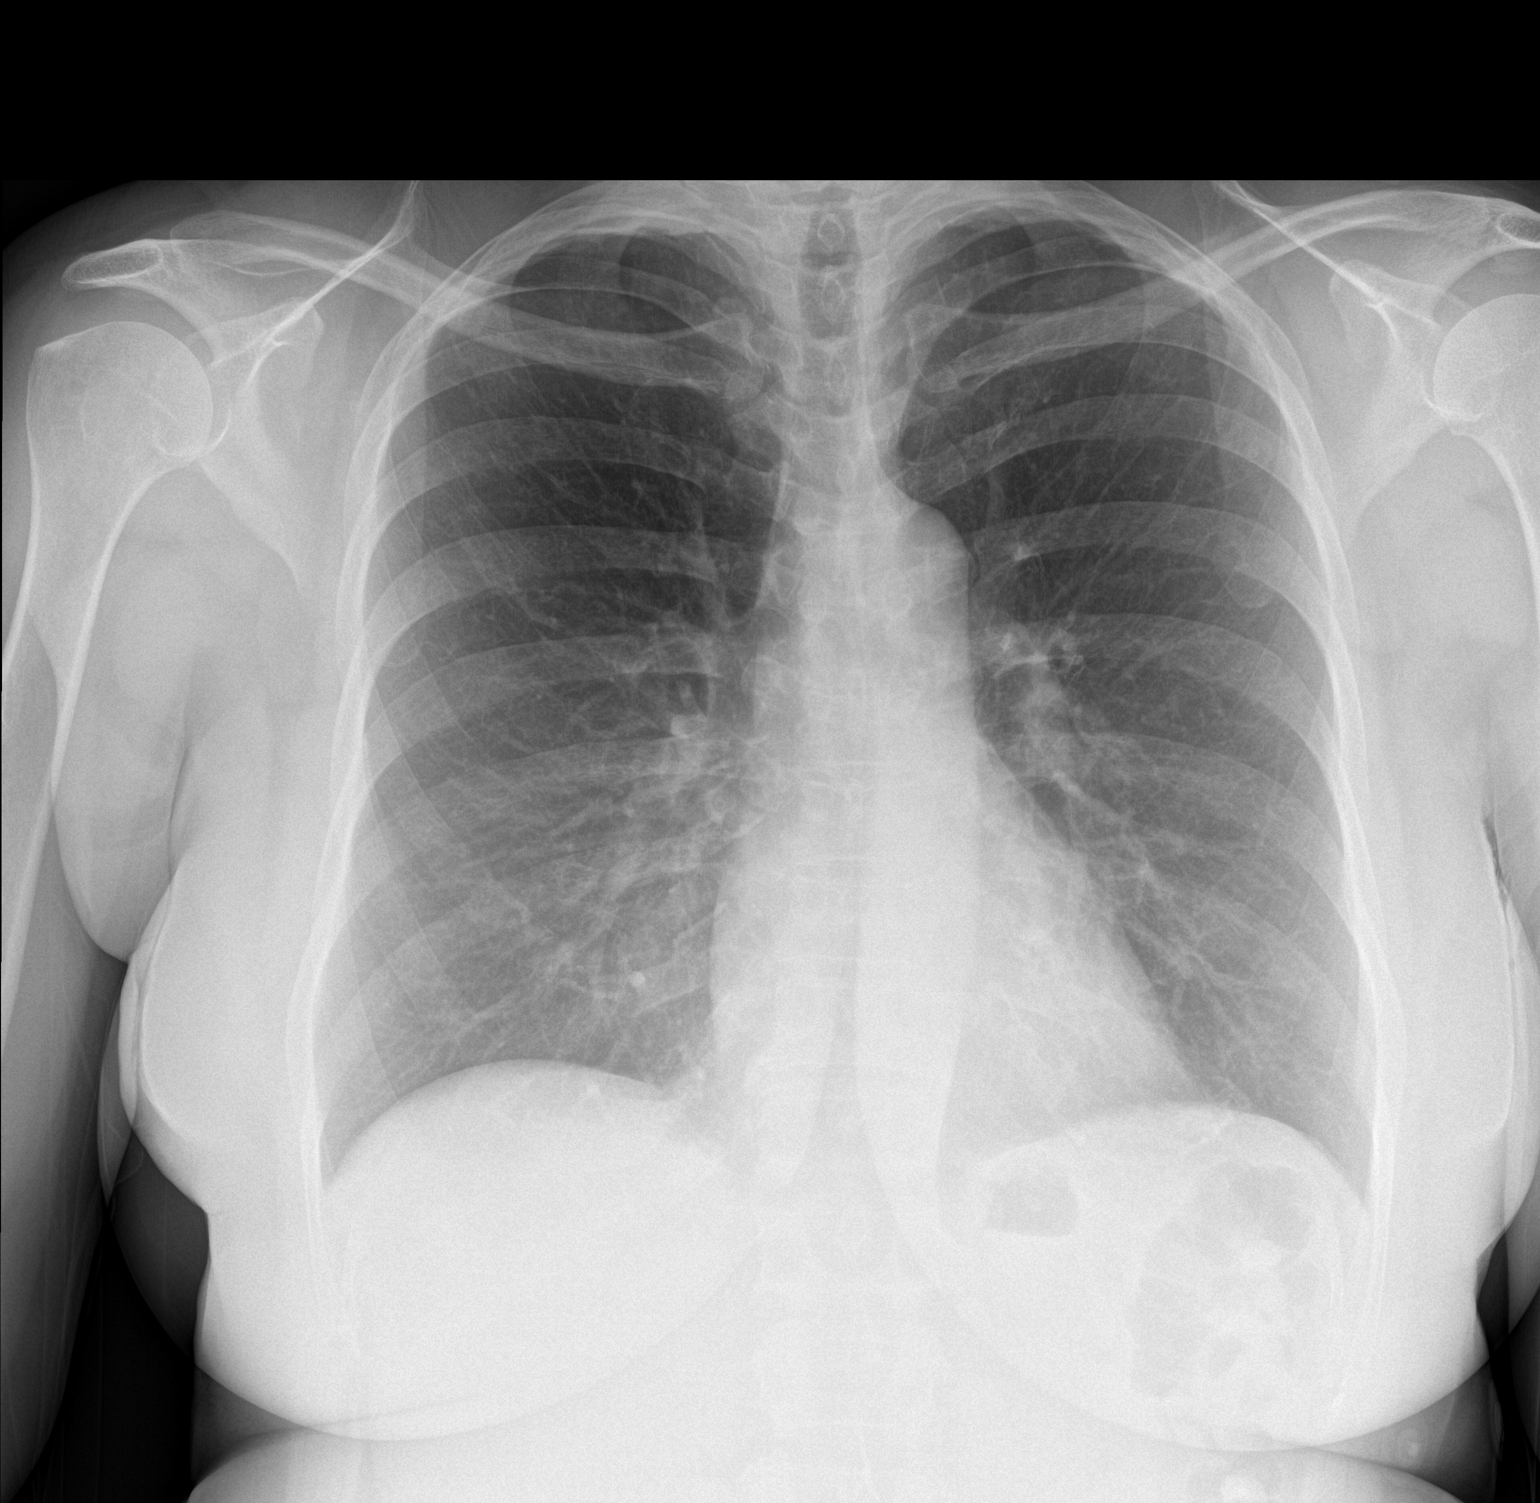

[chest lat]
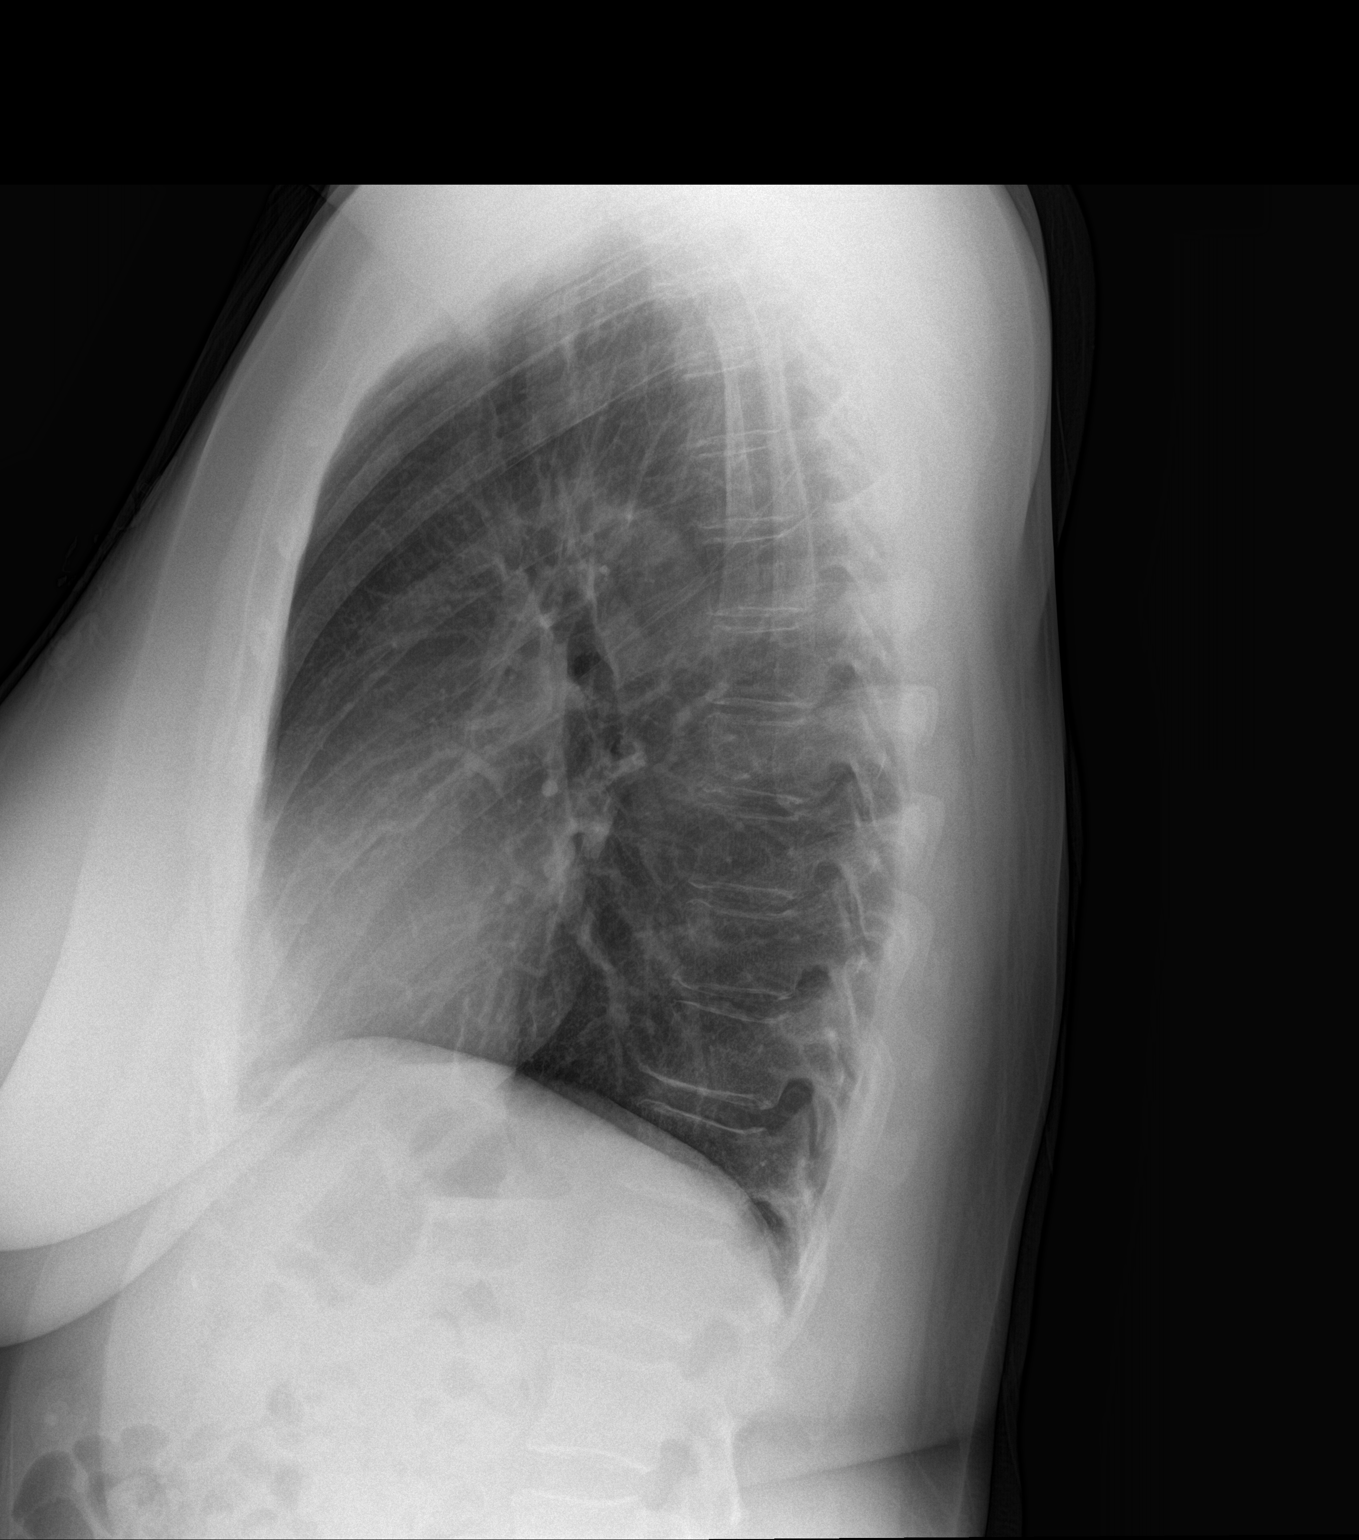

[2 of 2 positions shown; findings below may reference images not displayed]

FINDINGS: The heart size and mediastinal contours are within normal limits.
Both lungs are clear. The visualized skeletal structures are
unremarkable.
IMPRESSION: No active cardiopulmonary disease.

## 2017-09-06 ENCOUNTER — Ambulatory Visit: Payer: Medicare HMO

## 2017-09-20 ENCOUNTER — Ambulatory Visit (INDEPENDENT_AMBULATORY_CARE_PROVIDER_SITE_OTHER): Payer: Medicare HMO | Admitting: Internal Medicine

## 2017-09-20 ENCOUNTER — Encounter: Payer: Self-pay | Admitting: Internal Medicine

## 2017-09-20 ENCOUNTER — Other Ambulatory Visit: Payer: Self-pay

## 2017-09-20 VITALS — BP 125/72 | HR 90 | Temp 97.7°F | Ht 65.0 in | Wt 162.7 lb

## 2017-09-20 DIAGNOSIS — Z87440 Personal history of urinary (tract) infections: Secondary | ICD-10-CM

## 2017-09-20 DIAGNOSIS — N3001 Acute cystitis with hematuria: Secondary | ICD-10-CM

## 2017-09-20 DIAGNOSIS — R3129 Other microscopic hematuria: Secondary | ICD-10-CM

## 2017-09-20 DIAGNOSIS — R350 Frequency of micturition: Secondary | ICD-10-CM

## 2017-09-20 MED ORDER — CEPHALEXIN 500 MG PO CAPS: 500.0000 mg | ORAL_CAPSULE | Freq: Two times a day (BID) | ORAL | 0 refills | Status: AC

## 2017-09-20 NOTE — Progress Notes (Signed)
   CC: Concern for urinary tract infection  HPI:  [name] is a 57 y.o. female with a past medical history of conditions listed below presenting to the clinic as she is concerned she may have a urinary tract infection. Please see problem based charting for the status of the patient's current and chronic medical conditions.   Past Medical History:  Diagnosis Date  . Anemia   . Depression    bipolar  . GERD (gastroesophageal reflux disease)   . History of migraine headaches    headaches reported as migraines  . Hypotension   . Obesity   . Palpitations    negative cardiac w/u per Dr. Verl Blalock 2/09  . Perimenopause   . Reflux esophagitis   . Sleep apnea    No CPAP  . UTI (urinary tract infection)    Review of Systems: Pertinent positives mentioned in HPI. Remainder of all ROS negative.   Physical Exam:  Vitals:   09/20/17 1329  BP: 125/72  Pulse: 90  Temp: 97.7 F (36.5 C)  TempSrc: Oral  SpO2: 100%  Weight: 162 lb 11.2 oz (73.8 kg)  Height: 5\' 5"  (1.651 m)   Physical Exam  Constitutional: She is oriented to person, place, and time. She appears well-developed and well-nourished. No distress.  HENT:  Head: Normocephalic and atraumatic.  Eyes: Right eye exhibits no discharge. Left eye exhibits no discharge.  Cardiovascular: Normal rate, regular rhythm and intact distal pulses.  Pulmonary/Chest: Effort normal and breath sounds normal. No respiratory distress. She has no wheezes. She has no rales.  Abdominal: Soft. Bowel sounds are normal. She exhibits no distension. There is no tenderness.  Musculoskeletal: She exhibits no edema.  Neurological: She is alert and oriented to person, place, and time.  Skin: Skin is warm and dry.    Assessment & Plan:   See Encounters Tab for problem based charting.  Patient discussed with Dr. Angelia Mould

## 2017-09-20 NOTE — Assessment & Plan Note (Signed)
HPI/assessment Patient is complaining of urinary urgency and burning for the past 3 weeks.  States her urine is light yellow in color and denies having hematuria.  Denies having any vaginal discharge.  States she went to the [facility] 3 weeks ago when her symptoms had started and they had diagnosed her with a urinary tract infection. States she lost the prescription for the medication that was prescribed to her.  She does not remember the name of the medication.  She is postmenopausal. She is currently sexually active, last sexual activity 4 weeks ago.  Patient attributes her urinary tract infection to engaging in oral sex.  She does not use condoms. Per chart review, patient had a urinary tract infection in March 2018 and September 2018 and cultures at both times grew E. coli.  She was treated with Macrobid both times.  Testing for BV, Candida, chlamydia, gonorrhea, and trichomonas was negative in December 2018.  Urine dipstick done at this visit is negative for nitrites but showing a small amount of leukocyte esterase.  Also showing a small amount of blood.  Chart review reveals she has had microscopic hematuria in the past.  Patient states she was seen by urology 2 years ago and they did not have an answer for why she had microscopic hematuria.  Plan -Based on symptoms, will treat empirically for cystitis with Keflex 500 mg twice daily for 5 days -UA has been ordered to check for infection and RBCs.   -Advised patient to urinate after intercourse -Use condoms -Referral to urology due to history of microscopic hematuria and recurrent urinary tract infections

## 2017-09-20 NOTE — Patient Instructions (Addendum)
Nicole [name] it was nice meeting you today.  -Take Keflex as instructed for a total of 5 days.  -Please urinate after intercourse  -You have been referred to urology.   -Return for a follow-up in 6 weeks.

## 2017-09-21 LAB — URINALYSIS, ROUTINE W REFLEX MICROSCOPIC
BILIRUBIN UA: NEGATIVE
Glucose, UA: NEGATIVE
KETONES UA: NEGATIVE
NITRITE UA: POSITIVE — AB
Protein, UA: NEGATIVE
SPEC GRAV UA: 1.019 (ref 1.005–1.030)
Urobilinogen, Ur: 0.2 mg/dL (ref 0.2–1.0)
pH, UA: 5.5 (ref 5.0–7.5)

## 2017-09-21 LAB — MICROSCOPIC EXAMINATION
Casts: NONE SEEN /lpf
EPITHELIAL CELLS (NON RENAL): NONE SEEN /HPF (ref 0–10)
WBC, UA: >30 /hpf — AB (ref 0–?)

## 2017-09-23 NOTE — Progress Notes (Signed)
Internal Medicine Clinic Attending  Case discussed with Dr. Rathoreat the time of the visit. We reviewed the resident's history and exam and pertinent patient test results. I agree with the assessment, diagnosis, and plan of care documented in the resident's note.  

## 2017-09-25 DIAGNOSIS — F319 Bipolar disorder, unspecified: Secondary | ICD-10-CM | POA: Diagnosis not present

## 2017-10-29 NOTE — Addendum Note (Signed)
Addended by: Hulan Fray on: 10/29/2017 06:44 PM   Modules accepted: Orders

## 2017-11-06 ENCOUNTER — Ambulatory Visit: Payer: Medicare HMO

## 2017-11-07 ENCOUNTER — Encounter (INDEPENDENT_AMBULATORY_CARE_PROVIDER_SITE_OTHER): Payer: Self-pay

## 2017-11-07 ENCOUNTER — Encounter: Payer: Self-pay | Admitting: Internal Medicine

## 2017-11-07 ENCOUNTER — Ambulatory Visit (INDEPENDENT_AMBULATORY_CARE_PROVIDER_SITE_OTHER): Payer: Medicare HMO | Admitting: Internal Medicine

## 2017-11-07 VITALS — BP 102/68 | HR 78 | Temp 98.0°F | Ht 65.5 in | Wt 163.8 lb

## 2017-11-07 DIAGNOSIS — Z79899 Other long term (current) drug therapy: Secondary | ICD-10-CM

## 2017-11-07 DIAGNOSIS — L84 Corns and callosities: Secondary | ICD-10-CM | POA: Insufficient documentation

## 2017-11-07 DIAGNOSIS — K219 Gastro-esophageal reflux disease without esophagitis: Secondary | ICD-10-CM

## 2017-11-07 DIAGNOSIS — N912 Amenorrhea, unspecified: Secondary | ICD-10-CM | POA: Diagnosis not present

## 2017-11-07 DIAGNOSIS — J029 Acute pharyngitis, unspecified: Secondary | ICD-10-CM

## 2017-11-07 LAB — POCT URINE PREGNANCY: Preg Test, Ur: NEGATIVE

## 2017-11-07 MED ORDER — PANTOPRAZOLE SODIUM 40 MG PO TBEC: 40.0000 mg | DELAYED_RELEASE_TABLET | Freq: Every day | ORAL | 1 refills | Status: DC

## 2017-11-07 NOTE — Progress Notes (Signed)
Internal Medicine Clinic Attending  Case discussed with Dr. Amin at the time of the visit.  We reviewed the resident's history and exam and pertinent patient test results.  I agree with the assessment, diagnosis, and plan of care documented in the resident's note.    

## 2017-11-07 NOTE — Assessment & Plan Note (Signed)
Her symptoms of intermittent sore throat and cough most likely are related to her GERD symptoms.  She was given a prescription for Protonix.

## 2017-11-07 NOTE — Progress Notes (Signed)
CC: Intermittent sore throat for months.  HPI:  Nicole [name] is a 57 y.o. with past medical history as listed below came to the clinic with complaint of getting intermittent sore throat along with intermittent cough with clear sputum for months.  Patient was unable to specify the duration and her symptoms.  Apparently cough is little worse when she lays down but does not interfere with her sleep.  He denies any nasal congestion, occasionally gets some postnasal drip.  She denies any dysphagia, shortness of breath.  She do complained of getting burning epigastric sensation, occasionally retrosternal, worse when she lays down, she used to take Prilosec but not taking it anymore as she do not want to stay on any medication, using mustard to help with her GERD symptoms.  She denies any hemoptysis, fever or night sweats.  She was also thinking that she is pregnant, although her last menstrual cycle was years ago, but she can feel something moving in her stomach, denies any breast tenderness, denies any constipation or diarrhea.  She denies any urinary symptoms.  She is having unprotected sex for a year now.  A pregnancy test done. She do not want to repeat HIV testing, stating that she had it done in December 2018 [facility] and it was negative.  In our system she had a negative HIV testing in 2015.  She also brought some disability form to be filled out, she wants to get disability because of her mental health, apparently she followed at [facility] and get some counseling, not on any medication, I do not see any other medical ground for disability in her chart review.  I told her that we do not do disability forms in [facility], she can discuss with her PCP.  She might need to have those form filled out by her mental health provider if they think her mental health can make her disabled. Her PHQ 9 score today was 15.  Past Medical History:  Diagnosis Date  . Anemia   . Depression    bipolar  . GERD  (gastroesophageal reflux disease)   . History of migraine headaches    headaches reported as migraines  . Hypotension   . Obesity   . Palpitations    negative cardiac w/u per Dr. Verl Blalock 2/09  . Perimenopause   . Reflux esophagitis   . Sleep apnea    No CPAP  . UTI (urinary tract infection)    Review of Systems: Negative except mentioned in HPI.  Physical Exam:  Vitals:   11/07/17 0904  BP: 102/68  Pulse: 78  Temp: 98 F (36.7 C)  TempSrc: Oral  SpO2: 100%  Weight: 163 lb 12.8 oz (74.3 kg)  Height: 5' 5.5" (1.664 m)   Vitals:   11/07/17 0904  BP: 102/68  Pulse: 78  Temp: 98 F (36.7 C)  TempSrc: Oral  SpO2: 100%  Weight: 163 lb 12.8 oz (74.3 kg)  Height: 5' 5.5" (1.664 m)   General: Vital signs reviewed.  Patient is well-developed and well-nourished, in no acute distress and cooperative with exam.  HEENT.: Normocephalic and atraumatic.  EOMI, conjunctivae normal, no scleral icterus. No pharyngeal erythema or exudate, nasal turbinate normal, no erythema or exudate.  No enlarged tonsils. Cardiovascular: RRR, S1 normal, S2 normal, no murmurs, gallops, or rubs. Pulmonary/Chest: Clear to auscultation bilaterally, no wheezes, rales, or rhonchi. Abdominal: Soft, non-tender, non-distended, BS +, no masses, organomegaly, or guarding present.  Extremities: No lower extremity edema bilaterally,  pulses  symmetric and intact bilaterally. No cyanosis or clubbing. Skin: Warm, dry and intact.  A 1 x 1 cm callus on left lateral distal foot. Psychiatric: Patient appears little anxious.  Assessment & Plan:   See Encounters Tab for problem based charting.  Patient discussed with Dr. Evette Doffing.

## 2017-11-07 NOTE — Assessment & Plan Note (Signed)
She has very nonspecific symptoms, exam was completely benign. Most likely secondary to GERD.  She was given a prescription for Protonix to try.

## 2017-11-07 NOTE — Assessment & Plan Note (Signed)
She wants a referral to see a podiatrist-which was provided.

## 2017-11-07 NOTE — Assessment & Plan Note (Addendum)
She was very convinced that she is pregnant, I doubt it as she does not has any other symptoms.  Urine pregnancy test done in clinic was negative.

## 2017-11-07 NOTE — Patient Instructions (Addendum)
Thank you for visiting clinic today. Your intermittent sore throat and cough might be related to your GERD symptoms, please take Protonix 40 mg daily and see if that will help. You can discuss about disability form with your PCP. Your pregnancy test came back negative. Please follow-up with your PCP according to your scheduled appointment.

## 2017-11-12 ENCOUNTER — Ambulatory Visit (INDEPENDENT_AMBULATORY_CARE_PROVIDER_SITE_OTHER): Payer: Medicare HMO | Admitting: Internal Medicine

## 2017-11-12 ENCOUNTER — Encounter: Payer: Self-pay | Admitting: Internal Medicine

## 2017-11-12 VITALS — BP 100/62 | HR 78 | Temp 97.9°F | Ht 65.5 in | Wt 166.6 lb

## 2017-11-12 DIAGNOSIS — Z87440 Personal history of urinary (tract) infections: Secondary | ICD-10-CM | POA: Diagnosis not present

## 2017-11-12 DIAGNOSIS — F3160 Bipolar disorder, current episode mixed, unspecified: Secondary | ICD-10-CM | POA: Diagnosis not present

## 2017-11-12 DIAGNOSIS — K219 Gastro-esophageal reflux disease without esophagitis: Secondary | ICD-10-CM

## 2017-11-12 DIAGNOSIS — F1290 Cannabis use, unspecified, uncomplicated: Secondary | ICD-10-CM | POA: Diagnosis not present

## 2017-11-12 NOTE — Patient Instructions (Addendum)
Good to see you again Nicole [name]  To try to prevent any more urinary tract infections, be sure to use condoms during intercourse and urinate and wash afterwards. Also making sure that any lubricants used do not have "spermicide" on the label because that can increase the chance of a UTI. Be sure to get in touch with the Urology office to try to get your appointment re-scheduled.   Your last diabetes test was recent and it was the normal range.   We filled out some paperwork for you today.   Be sure to get in touch with [facility], to re-schedule your appointments that you weren't able to go to.    We will see you back in July to meet your new doctor.

## 2017-11-12 NOTE — Assessment & Plan Note (Signed)
She has a history of multiple urinary tract infections and microscopic hematuria   Which have successfully been treated in the past, 2 prior urine culture results indicate E. coli on both occasions.  She currently denies dysuria and urinary urgency seems to be associated with prolonged periods of not voiding.  Given lack of symptoms we will defer UA today.  She has been previously referred to urology for these recurrent infections and microscopic hematuria noted on UA, however she has not kept these appointments.  She was encouraged to try to reschedule if she has not been dismissed.  She does have unprotected sex and was again counseled on good hygiene, as well as avoidance of spermicide which has been associated as a risk factor for urinary tract infections.

## 2017-11-12 NOTE — Progress Notes (Signed)
CC: Follow up of chronic medical conditions   HPI:  [name] is a 57 y.o. F with a past medical history as described below who presents to the clinic for follow-up of chronic medical conditions.  Cough: Patient was recently seen in the [facility] clinic on 3/21 and noted ongoing complaint of cough and sore throat intermittently.  She was prescribed pantoprazole given her history of GERD, associated epigastric burning sensation, and not using medications for GERD at the time.  She reports she did not fill this prescription as she does not take medicines, notes ongoing cough that she also attributes to marijuana use.  Recurrent UTIs: Patient has a history of several UTIs, with culture results indicating E. coli on 2 occasions.  She has been treated with antibiotics on these occasions, most recent presentation February 1 with trace leukocytes, positive nitrites, positive symptoms but no culture data.  She was referred to urology given his recurrent UTIs, as well as microscopic hematuria noted on U/A's, but unfortunately she did not attend her scheduled urology appointment in February.  Today, she denies dysuria, notes urinary urgency but states this occurs when she holds in her urine for a long while.  She is sexually active with one partner but notes that he has multiple other partners, no condom use, she does use a lubricant but is unsure if this contains spermicide.  She denies vaginal discharge or bleeding.  She states she has been to urology in the past but needs to call the urology office to attempt to reschedule  Marijuana use: Patient notes regular marijuana use and requests medical marijuana as she feels this would be cleaner than what she obtains in the community.  She feels her marijuana use has helped control her glaucoma for which she states she has established with ophthalmology, no records available.  Bipolar Disorder: She also has a history of reported bipolar disorder and follows with  [facility], no records available.  She states she does not use medications but rather receives counseling every 2 weeks roughly.  She is currently a resident with [facility] and is requesting paperwork to be filled out.   Past Medical History:  Diagnosis Date  . Anemia   . Depression    bipolar  . GERD (gastroesophageal reflux disease)   . History of migraine headaches    headaches reported as migraines  . Hypotension   . Obesity   . Palpitations    negative cardiac w/u per Dr. Verl Blalock 2/09  . Perimenopause   . Reflux esophagitis   . Sleep apnea    No CPAP  . UTI (urinary tract infection)    Review of Systems:  Review of Systems  Constitutional: Negative for chills and fever.  Respiratory: Positive for cough.   Genitourinary: Positive for urgency. Negative for dysuria.       No vaginal discharge or bleeding      Physical Exam:  Vitals:   11/12/17 1335  BP: 100/62  Pulse: 78  Temp: 97.9 F (36.6 C)  TempSrc: Oral  SpO2: 100%  Weight: 166 lb 9.6 oz (75.6 kg)  Height: 5' 5.5" (1.664 m)   General: Sitting in chair comfortably, no acute distress HEENT: Normal conjuctiva, PERRL,  CV: RRR, no murmur appreciated  Resp: Clear breath sounds bilaterally, normal work of breathing, no distress  Extr: No edema Neuro: Alert and oriented x3, normal gait  Skin: Warm, dry      Assessment & Plan:   See Encounters Tab for problem  based charting.  Patient seen with Dr. Lynnae January

## 2017-11-12 NOTE — Assessment & Plan Note (Signed)
She reports regular marijuana use, does not show signs that she would like to decrease her use.  Instead, she requests medical marijuana as she feels this would be safer and cleaner.  She was advised that we would not prescribe this.  She also feels that her marijuana use helps prevent progression of her glaucoma, though this diagnosis is not confirmed as no records from prior ophthalmology visits are available.

## 2017-11-12 NOTE — Assessment & Plan Note (Signed)
She has a history of bipolar disorder per chart review on her report, she states she is currently established with [facility] where she receives counseling but she declines to be on any medications for further control.  While she is talkative and somewhat tangential , speech is nonpressured.  She reports difficulties with sleep though attributes this to her environment rather than a manic state related to her bipolar disorder.  She is currently residing through resources with the [facility] and is requesting an [place] to form to be filled out--filled out with appropriate diagnosis information, she states this will be returned to [facility] and social services.  The nature and complaints  noted at many of her visits raise the possibility of a somatoform type disorder such as illness anxiety disorder.  However, this may be attributed to her bipolar disorder as well as the official criteria for illness anxiety disorder note that this type of behavior is not better explained by another medical condition.  Regardless, reassurance and establishing a good rapport will be necessary moving forward.

## 2017-11-12 NOTE — Assessment & Plan Note (Signed)
She has a history of GERD and has previously been prescribed Prilosec, as well as Protonix for symptom control.  Is also felt that GERD may be contributing to her complaints of intermittent sore throat and cough, however she does not wish to use medications.  Will encouraged to avoid spicy foods and other behavioral modifications to lessen symptoms without pharmacologic therapy.

## 2017-11-15 NOTE — Progress Notes (Signed)
Internal Medicine Clinic Attending  Case discussed with Dr. Harden at the time of the visit.  We reviewed the resident's history and exam and pertinent patient test results.  I agree with the assessment, diagnosis, and plan of care documented in the resident's note.  

## 2017-11-26 DIAGNOSIS — R3121 Asymptomatic microscopic hematuria: Secondary | ICD-10-CM | POA: Diagnosis not present

## 2017-11-29 ENCOUNTER — Encounter: Payer: Self-pay | Admitting: Internal Medicine

## 2017-12-19 DIAGNOSIS — R3121 Asymptomatic microscopic hematuria: Secondary | ICD-10-CM | POA: Diagnosis not present

## 2017-12-23 DIAGNOSIS — H11423 Conjunctival edema, bilateral: Secondary | ICD-10-CM | POA: Diagnosis not present

## 2017-12-23 DIAGNOSIS — H2513 Age-related nuclear cataract, bilateral: Secondary | ICD-10-CM | POA: Diagnosis not present

## 2017-12-23 DIAGNOSIS — H5340 Unspecified visual field defects: Secondary | ICD-10-CM | POA: Diagnosis not present

## 2017-12-23 DIAGNOSIS — H25013 Cortical age-related cataract, bilateral: Secondary | ICD-10-CM | POA: Diagnosis not present

## 2017-12-23 DIAGNOSIS — H3589 Other specified retinal disorders: Secondary | ICD-10-CM | POA: Diagnosis not present

## 2017-12-23 DIAGNOSIS — H40023 Open angle with borderline findings, high risk, bilateral: Secondary | ICD-10-CM | POA: Diagnosis not present

## 2017-12-23 DIAGNOSIS — H40003 Preglaucoma, unspecified, bilateral: Secondary | ICD-10-CM | POA: Diagnosis not present

## 2017-12-23 DIAGNOSIS — H11823 Conjunctivochalasis, bilateral: Secondary | ICD-10-CM | POA: Diagnosis not present

## 2017-12-23 DIAGNOSIS — H04123 Dry eye syndrome of bilateral lacrimal glands: Secondary | ICD-10-CM | POA: Diagnosis not present

## 2018-01-07 ENCOUNTER — Ambulatory Visit: Payer: Medicare HMO | Admitting: Obstetrics and Gynecology

## 2018-01-20 DIAGNOSIS — L03032 Cellulitis of left toe: Secondary | ICD-10-CM | POA: Diagnosis not present

## 2018-01-20 DIAGNOSIS — B351 Tinea unguium: Secondary | ICD-10-CM | POA: Diagnosis not present

## 2018-01-20 DIAGNOSIS — D2372 Other benign neoplasm of skin of left lower limb, including hip: Secondary | ICD-10-CM | POA: Diagnosis not present

## 2018-01-20 DIAGNOSIS — B353 Tinea pedis: Secondary | ICD-10-CM | POA: Diagnosis not present

## 2018-01-20 DIAGNOSIS — D492 Neoplasm of unspecified behavior of bone, soft tissue, and skin: Secondary | ICD-10-CM | POA: Diagnosis not present

## 2018-01-20 DIAGNOSIS — L03031 Cellulitis of right toe: Secondary | ICD-10-CM | POA: Diagnosis not present

## 2018-01-20 DIAGNOSIS — D2371 Other benign neoplasm of skin of right lower limb, including hip: Secondary | ICD-10-CM | POA: Diagnosis not present

## 2018-01-21 ENCOUNTER — Ambulatory Visit: Payer: Medicare HMO | Admitting: Internal Medicine

## 2018-02-01 ENCOUNTER — Encounter (HOSPITAL_COMMUNITY): Payer: Self-pay | Admitting: *Deleted

## 2018-02-01 ENCOUNTER — Emergency Department (HOSPITAL_COMMUNITY): Payer: Medicare HMO

## 2018-02-01 ENCOUNTER — Emergency Department (HOSPITAL_COMMUNITY)
Admission: EM | Admit: 2018-02-01 | Discharge: 2018-02-01 | Disposition: A | Discharge status: Home / Self Care | Payer: Medicare HMO | Attending: Emergency Medicine | Admitting: Emergency Medicine

## 2018-02-01 DIAGNOSIS — Y999 Unspecified external cause status: Secondary | ICD-10-CM | POA: Diagnosis not present

## 2018-02-01 DIAGNOSIS — S60944A Unspecified superficial injury of right ring finger, initial encounter: Secondary | ICD-10-CM | POA: Insufficient documentation

## 2018-02-01 DIAGNOSIS — M79641 Pain in right hand: Secondary | ICD-10-CM | POA: Diagnosis not present

## 2018-02-01 DIAGNOSIS — S67194A Crushing injury of right ring finger, initial encounter: Secondary | ICD-10-CM | POA: Diagnosis not present

## 2018-02-01 DIAGNOSIS — W2209XA Striking against other stationary object, initial encounter: Secondary | ICD-10-CM | POA: Insufficient documentation

## 2018-02-01 DIAGNOSIS — Y9375 Activity, martial arts: Secondary | ICD-10-CM | POA: Diagnosis not present

## 2018-02-01 DIAGNOSIS — F1210 Cannabis abuse, uncomplicated: Secondary | ICD-10-CM | POA: Insufficient documentation

## 2018-02-01 DIAGNOSIS — Y929 Unspecified place or not applicable: Secondary | ICD-10-CM | POA: Insufficient documentation

## 2018-02-01 DIAGNOSIS — M79644 Pain in right finger(s): Secondary | ICD-10-CM | POA: Diagnosis present

## 2018-02-01 DIAGNOSIS — F17210 Nicotine dependence, cigarettes, uncomplicated: Secondary | ICD-10-CM | POA: Diagnosis not present

## 2018-02-01 DIAGNOSIS — M7989 Other specified soft tissue disorders: Secondary | ICD-10-CM | POA: Diagnosis not present

## 2018-02-01 DIAGNOSIS — S6991XA Unspecified injury of right wrist, hand and finger(s), initial encounter: Secondary | ICD-10-CM

## 2018-02-01 MED ORDER — HYDROCODONE-ACETAMINOPHEN 5-325 MG PO TABS: 1.0000 | ORAL_TABLET | Freq: Four times a day (QID) | ORAL | 0 refills | Status: DC | PRN

## 2018-02-01 MED ORDER — HYDROCODONE-ACETAMINOPHEN 5-325 MG PO TABS
1.0000 | ORAL_TABLET | Freq: Once | ORAL | Status: AC
  Administered 2018-02-01: 1 via ORAL
  Filled 2018-02-01: qty 1

## 2018-02-01 NOTE — ED Notes (Signed)
Ortho tec called for splint

## 2018-02-01 NOTE — ED Provider Notes (Signed)
[facility] DEPT Provider Note   CSN: [id number] Arrival date & time: 02/01/18  1411     History   Chief Complaint Chief Complaint  Patient presents with  . Hand Injury    HPI Nicole COUSIN is a 57 y.o. female who presents to the ED with right ring finger pain. Patient reports she was doing [employer] and hit her finger on the wall. Patient reports pain and swelling to the finger. Patient also states she has had reconstructive surgery on that finger.  Patient denies any other injuries.   HPI  Past Medical History:  Diagnosis Date  . Anemia   . Depression    bipolar  . GERD (gastroesophageal reflux disease)   . History of migraine headaches    headaches reported as migraines  . Hypotension   . Obesity   . Palpitations    negative cardiac w/u per Dr. Verl Blalock 2/09  . Perimenopause   . Reflux esophagitis   . Sleep apnea    No CPAP    Patient Active Problem List   Diagnosis Date Noted  . History of recurrent UTI (urinary tract infection) 11/12/2017  . Marijuana use 11/12/2017  . Foot callus 11/07/2017  . Amenorrhea 11/07/2017  . Breast nodule 04/19/2017  . Abnormal drug screen 04/19/2017  . History of worms 01/17/2017  . Sore throat 07/20/2016  . OSA (obstructive sleep apnea) 12/27/2015  . Chronic headaches 12/27/2015  . Post-menopausal bleeding 12/27/2015  . Gastroesophageal reflux disease 12/16/2009  . Fungal infection of foot 04/14/2007  . Bipolar disorder ([place]) 04/14/2007  . Normocytic anemia 10/31/2005    Past Surgical History:  Procedure Laterality Date  . COLONOSCOPY    . ECTOPIC PREGNANCY SURGERY    . EXPLORATORY LAPAROTOMY  2000   x2 (Dr. Kendall Flack)  . HERPES SIMPLEX VIRUS DFA    . TUBAL LIGATION  1987     OB History    Gravida  4   Para  3   Term  3   Preterm  0   AB  1   Living  3     SAB  0   TAB  0   Ectopic  1   Multiple  0   Live Births  3            Home Medications    Prior  to Admission medications   Not on File    Family History Family History  Problem Relation Age of Onset  . Hypertension Mother   . Diabetes Mother   . Heart disease Mother   . Thyroid disease Mother   . Heart attack Mother   . Glaucoma Mother   . Pancreatic cancer Other        family history  . Heart attack Maternal Grandmother   . Heart attack Maternal Grandfather   . Breast cancer Maternal Aunt   . Pancreatic cancer Maternal Uncle   . Diabetes Sister   . Hypertension Sister   . Colon cancer Neg Hx     Social History Social History   Tobacco Use  . Smoking status: Current Some Day Smoker    Packs/day: 0.25    Years: 36.00    Pack years: 9.00    Types: Cigarettes    Last attempt to quit: 07/20/2011    Years since quitting: 6.5  . Smokeless tobacco: Never Used  . Tobacco comment: marijuana  Substance Use Topics  . Alcohol use: Yes    Alcohol/week:  0.0 oz    Comment: Rare glass of wine  . Drug use: Yes    Types: Marijuana    Comment: daily marijuana use      Allergies   Other   Review of Systems Review of Systems  Musculoskeletal: Positive for arthralgias.       Right ring finger  All other systems reviewed and are negative.    Physical Exam Updated Vital Signs BP (!) 141/81 (BP Location: Left Arm)   Pulse 61   Temp 99.6 F (37.6 C) (Oral)   Resp 18   LMP 03/03/2016 (Approximate)   SpO2 100%   Physical Exam  Constitutional: She appears well-developed and well-nourished. No distress.  HENT:  Head: Normocephalic.  Eyes: EOM are normal.  Neck: Neck supple.  Cardiovascular: Normal rate.  Pulmonary/Chest: Effort normal.  Musculoskeletal:       Right hand: She exhibits tenderness and swelling. She exhibits no laceration. Normal sensation noted. Normal strength noted.       Hands: Right ring finger with swelling and tenderness on exam. Radial pulse 2+, adequate circulation.   Neurological: She is alert.  Skin: Skin is warm and dry.    Psychiatric: She has a normal mood and affect. Her behavior is normal.  Nursing note and vitals reviewed.    ED Treatments / Results  Labs (all labs ordered are listed, but only abnormal results are displayed) Labs Reviewed - No data to display  EKG None  Radiology Dg Hand Complete Right  Result Date: 02/01/2018 CLINICAL DATA:  57 year old female with a history of swelling EXAM: RIGHT HAND - COMPLETE 3+ VIEW COMPARISON:  None. FINDINGS: No acute displaced fracture.  No radiopaque foreign body. Surgical changes of the fourth and fifth digit of the right hand. Mild degenerative changes without subluxation/dislocation. Soft tissue swelling at the proximal interphalangeal joint of the fourth digit IMPRESSION: Negative for acute bony abnormality. Soft tissue swelling at the fourth digit proximal interphalangeal joint. Electronically Signed   By: Corrie Mckusick D.O.   On: 02/01/2018 14:54    Procedures Procedures (including critical care time)  Medications Ordered in ED Medications  HYDROcodone-acetaminophen (NORCO/VICODIN) 5-325 MG per tablet 1 tablet (1 tablet Oral Given 02/01/18 1431)     Initial Impression / Assessment and Plan / ED Course  I have reviewed the triage vital signs and the nursing notes. 57 y.o. female with right ring finger pain s/p injury stable for d/c without focal neuro deficits and no fracture or dislocation noted on x-ray. Patient to f/u with her PCP or orthopedic doctor if symptoms persist.   Final Clinical Impressions(s) / ED Diagnoses   Final diagnoses:  Jammed finger (interphalangeal joint), right, initial encounter    ED Discharge Orders    None       Debroah Baller [place], [place] 02/01/18 1503    Margette Fast, MD 02/01/18 [phone]

## 2018-02-01 NOTE — ED Triage Notes (Signed)
Pt complains of right 4th finger pain and swelling since jamming her finger while performing martial arts today. Pt has limited ROM in right hand.

## 2018-02-01 NOTE — Discharge Instructions (Addendum)
Wear the splint for comfort. Take tylenol and ibuprofen as needed for pain. Follow up with the doctor that did surgery on your hand if symptoms persist. Return here as needed.

## 2018-02-13 ENCOUNTER — Ambulatory Visit: Payer: Medicare HMO

## 2018-02-17 ENCOUNTER — Other Ambulatory Visit (HOSPITAL_COMMUNITY)
Admission: RE | Admit: 2018-02-17 | Discharge: 2018-02-17 | Disposition: A | Discharge status: Home / Self Care | Payer: Medicare HMO | Source: Ambulatory Visit | Attending: Internal Medicine | Admitting: Internal Medicine

## 2018-02-17 ENCOUNTER — Ambulatory Visit (INDEPENDENT_AMBULATORY_CARE_PROVIDER_SITE_OTHER): Payer: Medicare HMO | Admitting: Internal Medicine

## 2018-02-17 ENCOUNTER — Other Ambulatory Visit: Payer: Self-pay

## 2018-02-17 VITALS — BP 120/75 | HR 65 | Temp 98.3°F | Ht 65.5 in | Wt 166.2 lb

## 2018-02-17 DIAGNOSIS — K219 Gastro-esophageal reflux disease without esophagitis: Secondary | ICD-10-CM | POA: Diagnosis not present

## 2018-02-17 DIAGNOSIS — S6981XD Other specified injuries of right wrist, hand and finger(s), subsequent encounter: Secondary | ICD-10-CM | POA: Diagnosis not present

## 2018-02-17 DIAGNOSIS — Z7689 Persons encountering health services in other specified circumstances: Secondary | ICD-10-CM | POA: Diagnosis not present

## 2018-02-17 DIAGNOSIS — W230XXD Caught, crushed, jammed, or pinched between moving objects, subsequent encounter: Secondary | ICD-10-CM | POA: Diagnosis not present

## 2018-02-17 DIAGNOSIS — Z9851 Tubal ligation status: Secondary | ICD-10-CM

## 2018-02-17 DIAGNOSIS — F329 Major depressive disorder, single episode, unspecified: Secondary | ICD-10-CM

## 2018-02-17 DIAGNOSIS — Z87440 Personal history of urinary (tract) infections: Secondary | ICD-10-CM | POA: Diagnosis not present

## 2018-02-17 DIAGNOSIS — S6991XD Unspecified injury of right wrist, hand and finger(s), subsequent encounter: Secondary | ICD-10-CM

## 2018-02-17 DIAGNOSIS — Y9375 Activity, martial arts: Secondary | ICD-10-CM | POA: Diagnosis not present

## 2018-02-17 DIAGNOSIS — F419 Anxiety disorder, unspecified: Secondary | ICD-10-CM

## 2018-02-17 DIAGNOSIS — Z113 Encounter for screening for infections with a predominantly sexual mode of transmission: Secondary | ICD-10-CM | POA: Insufficient documentation

## 2018-02-17 NOTE — Assessment & Plan Note (Signed)
-   Minor trauma during martial arts exercise - Xray at [facility] ER (6/15) reveal no fracture - Pain and ROM improving, Swelling still present - Recommend icing and resuming splint use

## 2018-02-17 NOTE — Progress Notes (Signed)
   CC: Swollen right finger  HPI: [name] is a 57 y.o.F w/ PMH of GERD, Depression and Anxiety, and recurrent UTIs presenting with CC of swollen right finger. She was in her usual state of health until 02/01/18 when she hurt her right 4th finger while sparring at [facility]. She visited the ER at [facility] at the time. X-ray at [facility] reviewed no acute fracture. She was given a splint at the time but stopped using it the day after. Patient reports pain and swelling has decreased since the incident but wanted a follow up visit for assessment  Past Medical History:  Diagnosis Date  . Anemia   . Depression    bipolar  . GERD (gastroesophageal reflux disease)   . History of migraine headaches    headaches reported as migraines  . Hypotension   . Obesity   . Palpitations    negative cardiac w/u per Dr. Verl Blalock 2/09  . Perimenopause   . Reflux esophagitis   . Sleep apnea    No CPAP    Current Outpatient Medications on File Prior to Visit  Medication Sig Dispense Refill  . HYDROcodone-acetaminophen (NORCO/VICODIN) 5-325 MG tablet Take 1 tablet by mouth every 6 (six) hours as needed. 6 tablet 0   No current facility-administered medications on file prior to visit.      Review of Systems: Review of Systems  Constitutional: Negative for chills, fever and malaise/fatigue.  Genitourinary: Negative for dysuria, flank pain, frequency and urgency.  Neurological: Negative for sensory change, focal weakness and weakness.     Physical Exam: Vitals:   02/17/18 0834  BP: 120/75  Pulse: 65  Temp: 98.3 F (36.8 C)  TempSrc: Oral  SpO2: 100%  Weight: 166 lb 3.2 oz (75.4 kg)  Height: 5' 5.5" (1.664 m)    Physical Exam  Constitutional: She appears well-nourished. No distress.  Neck: Normal range of motion. Neck supple. No JVD present.  Cardiovascular: Normal rate, regular rhythm, normal heart sounds and intact distal pulses.  Respiratory: Effort normal and breath sounds normal.  No respiratory distress. She has no wheezes.  GI: Soft. Bowel sounds are normal. She exhibits no distension. There is no tenderness.  Musculoskeletal:  Swelling at right hand ring finger. Pain on flexion of proximal interphalangeal joint. Range of Motion on flexion slightly limited. No tenderness to palpation. No warmth to touch. No erythema.  Lymphadenopathy:    She has no cervical adenopathy.  Skin: Skin is warm and dry. No rash noted. No erythema.     Assessment & Plan:   See Encounters Tab for problem based charting.  Patient seen with Dr. Daryll Drown

## 2018-02-17 NOTE — Assessment & Plan Note (Signed)
-   Pt reports concerns for possible STD exposure - Unable to recall total lifetime partner - Current 1 heterosexual relationship of 2 years - States no contraceptive or barrier use - Hx of bilateral tubal ligations during her late 28s - Order routine STD screening for HIV, RPR, GC/Chlamydia

## 2018-02-17 NOTE — Patient Instructions (Signed)
Hello Ms.Scheidegger  Thank you for coming into the office today.  We examined your finger and reviewed your X-rays and found no evidence of fractures. Please make sure to ice the finger regularly and try to keep the finger in the stent as long as possible. If the finger does not improve in 2 weeks or so, please let us know.   We have also ordered screening tests for commonly sexual transmitted disease. We will call you with the results as soon as they come in. Also please call the number above and let us know of the medication that you are interested in.   Thank you so much for choosing [facility]

## 2018-02-18 ENCOUNTER — Telehealth: Payer: Self-pay | Admitting: Internal Medicine

## 2018-02-18 LAB — URINE CYTOLOGY ANCILLARY ONLY
Chlamydia: NEGATIVE
NEISSERIA GONORRHEA: NEGATIVE

## 2018-02-18 LAB — RPR: RPR: NONREACTIVE

## 2018-02-18 LAB — HIV ANTIBODY (ROUTINE TESTING W REFLEX): HIV SCREEN 4TH GENERATION: NONREACTIVE

## 2018-02-18 NOTE — Telephone Encounter (Signed)
Call went straight to voice mail. Left message stating all the lab results for HIV, Syphilis and GC/Chlamydia were negative.

## 2018-02-19 NOTE — Progress Notes (Signed)
Internal Medicine Clinic Attending  I saw and evaluated the patient.  I personally confirmed the key portions of the history and exam documented by Dr. Lee and I reviewed pertinent patient test results.  The assessment, diagnosis, and plan were formulated together and I agree with the documentation in the resident's note.  

## 2018-03-10 ENCOUNTER — Encounter: Payer: Self-pay | Admitting: *Deleted

## 2018-03-19 ENCOUNTER — Ambulatory Visit: Payer: Medicare HMO | Admitting: Student

## 2018-03-19 ENCOUNTER — Ambulatory Visit: Payer: Medicare HMO | Admitting: Obstetrics & Gynecology

## 2018-04-16 ENCOUNTER — Emergency Department (HOSPITAL_COMMUNITY): Payer: Medicare HMO

## 2018-04-16 ENCOUNTER — Encounter (HOSPITAL_COMMUNITY): Payer: Self-pay | Admitting: Emergency Medicine

## 2018-04-16 ENCOUNTER — Emergency Department (HOSPITAL_COMMUNITY)
Admission: EM | Admit: 2018-04-16 | Discharge: 2018-04-16 | Disposition: A | Discharge status: Home / Self Care | Payer: Medicare HMO | Attending: Emergency Medicine | Admitting: Emergency Medicine

## 2018-04-16 DIAGNOSIS — E161 Other hypoglycemia: Secondary | ICD-10-CM | POA: Diagnosis not present

## 2018-04-16 DIAGNOSIS — E162 Hypoglycemia, unspecified: Secondary | ICD-10-CM | POA: Diagnosis not present

## 2018-04-16 DIAGNOSIS — Y929 Unspecified place or not applicable: Secondary | ICD-10-CM | POA: Diagnosis not present

## 2018-04-16 DIAGNOSIS — Y999 Unspecified external cause status: Secondary | ICD-10-CM | POA: Diagnosis not present

## 2018-04-16 DIAGNOSIS — X58XXXA Exposure to other specified factors, initial encounter: Secondary | ICD-10-CM | POA: Insufficient documentation

## 2018-04-16 DIAGNOSIS — Z79899 Other long term (current) drug therapy: Secondary | ICD-10-CM | POA: Insufficient documentation

## 2018-04-16 DIAGNOSIS — F17210 Nicotine dependence, cigarettes, uncomplicated: Secondary | ICD-10-CM | POA: Insufficient documentation

## 2018-04-16 DIAGNOSIS — R079 Chest pain, unspecified: Secondary | ICD-10-CM | POA: Insufficient documentation

## 2018-04-16 DIAGNOSIS — S1092XA Blister (nonthermal) of unspecified part of neck, initial encounter: Secondary | ICD-10-CM | POA: Diagnosis not present

## 2018-04-16 DIAGNOSIS — R0602 Shortness of breath: Secondary | ICD-10-CM

## 2018-04-16 DIAGNOSIS — R52 Pain, unspecified: Secondary | ICD-10-CM | POA: Diagnosis not present

## 2018-04-16 DIAGNOSIS — R064 Hyperventilation: Secondary | ICD-10-CM | POA: Diagnosis not present

## 2018-04-16 DIAGNOSIS — Y939 Activity, unspecified: Secondary | ICD-10-CM | POA: Insufficient documentation

## 2018-04-16 LAB — URINALYSIS, ROUTINE W REFLEX MICROSCOPIC
Bacteria, UA: NONE SEEN
Bilirubin Urine: NEGATIVE
GLUCOSE, UA: NEGATIVE mg/dL
Ketones, ur: 5 mg/dL — AB
Leukocytes, UA: NEGATIVE
Nitrite: NEGATIVE
PH: 5 (ref 5.0–8.0)
Protein, ur: NEGATIVE mg/dL
SPECIFIC GRAVITY, URINE: 1.026 (ref 1.005–1.030)

## 2018-04-16 LAB — BASIC METABOLIC PANEL
ANION GAP: 10 (ref 5–15)
BUN: 16 mg/dL (ref 6–20)
CO2: 25 mmol/L (ref 22–32)
Calcium: 9.8 mg/dL (ref 8.9–10.3)
Chloride: 108 mmol/L (ref 98–111)
Creatinine, Ser: 0.83 mg/dL (ref 0.44–1.00)
GFR calc Af Amer: >60 mL/min (ref >60)
GLUCOSE: 144 mg/dL — AB (ref 70–99)
POTASSIUM: 3.8 mmol/L (ref 3.5–5.1)
Sodium: 143 mmol/L (ref 135–145)

## 2018-04-16 LAB — CBC
HEMATOCRIT: 37.2 % (ref 36.0–46.0)
Hemoglobin: 12.8 g/dL (ref 12.0–15.0)
MCH: 31.5 pg (ref 26.0–34.0)
MCHC: 34.4 g/dL (ref 30.0–36.0)
MCV: 91.6 fL (ref 78.0–100.0)
Platelets: 223 10*3/uL (ref 150–400)
RBC: 4.06 MIL/uL (ref 3.87–5.11)
RDW: 13.1 % (ref 11.5–15.5)
WBC: 5.5 10*3/uL (ref 4.0–10.5)

## 2018-04-16 LAB — RAPID URINE DRUG SCREEN, HOSP PERFORMED
AMPHETAMINES: NOT DETECTED
BARBITURATES: NOT DETECTED
BENZODIAZEPINES: NOT DETECTED
Cocaine: NOT DETECTED
Opiates: NOT DETECTED
TETRAHYDROCANNABINOL: POSITIVE — AB

## 2018-04-16 LAB — I-STAT TROPONIN, ED: TROPONIN I, POC: 0.02 ng/mL (ref 0.00–0.08)

## 2018-04-16 MED ORDER — BACITRACIN ZINC 500 UNIT/GM EX OINT
TOPICAL_OINTMENT | Freq: Two times a day (BID) | CUTANEOUS | Status: DC
  Administered 2018-04-16: 21:00:00 via TOPICAL
  Filled 2018-04-16: qty 0.9

## 2018-04-16 NOTE — Discharge Instructions (Addendum)
Clean the wound on your neck 2 or 3 times a day with soap and water and apply an antibiotic ointment like Neosporin or bacitracin.  Follow-up with the doctor of your choice as needed for problems.  Consider avoiding smoking for now.

## 2018-04-16 NOTE — ED Provider Notes (Signed)
[facility] DEPT Provider Note   CSN: [id number] Arrival date & time: 04/16/18  1635     History   Chief Complaint Chief Complaint  Patient presents with  . Shortness of Breath  . Chest Pain    HPI Nicole [name] is a 57 y.o. female.  HPI   She complains of feeling hot and sweaty, after being outside for a while today.  She also had transient shortness of breath which has spontaneously resolved.  She is concerned about a wound in her neck which started after she was scratching herself several days ago.  She denies fever, chills, nausea, vomiting, dysuria, cough, focal weakness or paresthesia.  There are no other known modifying factors.  Past Medical History:  Diagnosis Date  . Anemia   . Depression    bipolar  . GERD (gastroesophageal reflux disease)   . History of migraine headaches    headaches reported as migraines  . Hypotension   . Obesity   . Palpitations    negative cardiac w/u per Dr. Verl Blalock 2/09  . Perimenopause   . Reflux esophagitis   . Sleep apnea    No CPAP    Patient Active Problem List   Diagnosis Date Noted  . Jammed finger (interphalangeal joint), right, subsequent encounter 02/17/2018    Class: Acute  . Encounter for assessment of STD exposure 02/17/2018  . History of recurrent UTI (urinary tract infection) 11/12/2017  . Marijuana use 11/12/2017  . Foot callus 11/07/2017  . Amenorrhea 11/07/2017  . Breast nodule 04/19/2017  . Abnormal drug screen 04/19/2017  . History of worms 01/17/2017  . Sore throat 07/20/2016  . OSA (obstructive sleep apnea) 12/27/2015  . Chronic headaches 12/27/2015  . Post-menopausal bleeding 12/27/2015  . Gastroesophageal reflux disease 12/16/2009  . Fungal infection of foot 04/14/2007  . Bipolar disorder ([place]) 04/14/2007  . Normocytic anemia 10/31/2005    Past Surgical History:  Procedure Laterality Date  . COLONOSCOPY    . ECTOPIC PREGNANCY SURGERY    . EXPLORATORY LAPAROTOMY   2000   x2 (Dr. Kendall Flack)  . HERPES SIMPLEX VIRUS DFA    . TUBAL LIGATION  1987     OB History    Gravida  4   Para  3   Term  3   Preterm  0   AB  1   Living  3     SAB  0   TAB  0   Ectopic  1   Multiple  0   Live Births  3            Home Medications    Prior to Admission medications   Medication Sig Start Date End Date Taking? Authorizing Provider  HYDROcodone-acetaminophen (NORCO/VICODIN) 5-325 MG tablet Take 1 tablet by mouth every 6 (six) hours as needed. Patient not taking: Reported on 04/16/2018 02/01/18   Ashley Murrain, NP    Family History Family History  Problem Relation Age of Onset  . Hypertension Mother   . Diabetes Mother   . Heart disease Mother   . Thyroid disease Mother   . Heart attack Mother   . Glaucoma Mother   . Pancreatic cancer Other        family history  . Heart attack Maternal Grandmother   . Heart attack Maternal Grandfather   . Breast cancer Maternal Aunt   . Pancreatic cancer Maternal Uncle   . Diabetes Sister   . Hypertension Sister   .  Colon cancer Neg Hx     Social History Social History   Tobacco Use  . Smoking status: Current Some Day Smoker    Packs/day: 0.25    Years: 36.00    Pack years: 9.00    Types: Cigarettes    Last attempt to quit: 07/20/2011    Years since quitting: 6.7  . Smokeless tobacco: Never Used  . Tobacco comment: marijuana  Substance Use Topics  . Alcohol use: Yes    Alcohol/week: 0.0 standard drinks    Comment: Rare glass of wine  . Drug use: Yes    Types: Marijuana    Comment: daily marijuana use      Allergies   Other   Review of Systems Review of Systems  All other systems reviewed and are negative.    Physical Exam Updated Vital Signs BP 127/69 (BP Location: Left Arm)   Pulse 72   Temp 98.8 F (37.1 C) (Oral)   Resp 14   LMP 03/03/2016 (Approximate)   SpO2 100%   Physical Exam  Constitutional: She is oriented to person, place, and time. She  appears well-developed and well-nourished. She does not appear ill. No distress.  HENT:  Head: Normocephalic and atraumatic.  Eyes: Pupils are equal, round, and reactive to light. Conjunctivae and EOM are normal.  Neck: Normal range of motion and phonation normal. Neck supple.  Cardiovascular: Normal rate and regular rhythm.  Pulmonary/Chest: Effort normal and breath sounds normal. No stridor. No respiratory distress. She has no wheezes. She exhibits no tenderness.  Abdominal: Soft. She exhibits no distension. There is no tenderness. There is no guarding.  Musculoskeletal: Normal range of motion.  Neurological: She is alert and oriented to person, place, and time. She exhibits normal muscle tone.  No dysarthria or aphasia.  Skin: Skin is warm and dry.  Small flat blister over left medial clavicle, approximately 1.5 x 3 cm in length.  Small amount of associated redness, without drainage, fluctuance or bleeding.  Psychiatric: She has a normal mood and affect. Her behavior is normal. Judgment and thought content normal.  Nursing note and vitals reviewed.    ED Treatments / Results  Labs (all labs ordered are listed, but only abnormal results are displayed) Labs Reviewed  BASIC METABOLIC PANEL - Abnormal; Notable for the following components:      Result Value   Glucose, Bld 144 (*)    All other components within normal limits  URINALYSIS, ROUTINE W REFLEX MICROSCOPIC - Abnormal; Notable for the following components:   Hgb urine dipstick MODERATE (*)    Ketones, ur 5 (*)    All other components within normal limits  RAPID URINE DRUG SCREEN, HOSP PERFORMED - Abnormal; Notable for the following components:   Tetrahydrocannabinol POSITIVE (*)    All other components within normal limits  CBC  I-STAT TROPONIN, ED  I-STAT BETA HCG BLOOD, ED ([facility], [facility], [facility] ONLY)    EKG EKG Interpretation  Date/Time:  Wednesday April 16 2018 16:49:06 EDT Ventricular Rate:  89 PR Interval:    QRS  Duration: 72 QT Interval:  340 QTC Calculation: 414 R Axis:   66 Text Interpretation:  Sinus rhythm Minimal ST elevation, inferior leads since last tracing no significant change Confirmed by Daleen Bo [phone]) on 04/16/2018 5:29:57 PM   Radiology Dg Chest 2 View  Result Date: 04/16/2018 CLINICAL DATA:  Chest pain.  History of anxiety. EXAM: CHEST - 2 VIEW COMPARISON:  11/01/2016 FINDINGS: The heart size and mediastinal contours are  within normal limits. Both lungs are clear. The visualized skeletal structures are unremarkable. IMPRESSION: No active cardiopulmonary disease. Electronically Signed   By: Ashley Royalty M.D.   On: 04/16/2018 18:03    Procedures Procedures (including critical care time)  Medications Ordered in ED Medications  bacitracin ointment (has no administration in time range)     Initial Impression / Assessment and Plan / ED Course  I have reviewed the triage vital signs and the nursing notes.  Pertinent labs & imaging results that were available during my care of the patient were reviewed by me and considered in my medical decision making (see chart for details).  Clinical Course as of Apr 16 2042  Wed Apr 16, 2018  2033 Normal except blood present with RBCs.  Catch sample.  Urinalysis, Routine w reflex microscopic(!) [EW]  2039 Abnormal, THC present  Urine rapid drug screen (hosp performed)(!) [EW]  2039 Normal  I-stat troponin, ED [EW]  2039 Normal except glucose high  Basic metabolic panel(!) [EW]  7782 No infiltrate or CHF, images reviewed by me  DG Chest 2 View [EW]    Clinical Course User Index [EW] Daleen Bo, MD     Patient Vitals for the past 24 hrs:  BP Temp Temp src Pulse Resp SpO2  04/16/18 1907 127/69 - - 72 14 100 %  04/16/18 1645 110/86 98.8 F (37.1 C) Oral 93 20 100 %    8:43 PM Reevaluation with update and discussion. After initial assessment and treatment, an updated evaluation reveals she remains comfortable has no further  complaints.  Findings discussed and questions answered. Daleen Bo   Medical Decision Making: Nonspecific dyspnea without evidence for pneumonia, CHF or serious bacterial infection.  It would help patient does not smoke cigarettes and marijuana.  Blister left neck likely friction related secondary to scratching.  Doubt deep tissue infection in the neck.  CRITICAL CARE-no Performed by: Daleen Bo   Nursing Notes Reviewed/ Care Coordinated Applicable Imaging Reviewed Interpretation of Laboratory Data incorporated into ED treatment  The patient appears reasonably screened and/or stabilized for discharge and I doubt any other medical condition or other [facility] requiring further screening, evaluation, or treatment in the ED at this time prior to discharge.  Plan: Home Medications-OTC antibiotic ointment of choice; Home Treatments-stop smoking; return here if the recommended treatment, does not improve the symptoms; Recommended follow up-PCP, as needed \   Final Clinical Impressions(s) / ED Diagnoses   Final diagnoses:  Shortness of breath  Blister of neck, initial encounter    ED Discharge Orders    None       Daleen Bo, MD 04/16/18 2044

## 2018-04-16 NOTE — ED Triage Notes (Addendum)
Per [facility] pt from home for chest pain when she breathes. Pt was outside breathing harder than normal. Pt hx anxiety.  Pt reports she was outside in the heat and after walking to mailbox and back inside reports became sweaty, hot and SOB.

## 2018-04-23 ENCOUNTER — Encounter: Payer: Self-pay | Admitting: Internal Medicine

## 2018-04-23 ENCOUNTER — Other Ambulatory Visit: Payer: Self-pay

## 2018-04-23 ENCOUNTER — Ambulatory Visit (INDEPENDENT_AMBULATORY_CARE_PROVIDER_SITE_OTHER): Payer: Medicare HMO | Admitting: Internal Medicine

## 2018-04-23 VITALS — BP 99/65 | HR 66 | Temp 98.7°F | Ht 65.5 in | Wt 157.2 lb

## 2018-04-23 DIAGNOSIS — F1290 Cannabis use, unspecified, uncomplicated: Secondary | ICD-10-CM | POA: Diagnosis not present

## 2018-04-23 DIAGNOSIS — I959 Hypotension, unspecified: Secondary | ICD-10-CM | POA: Diagnosis not present

## 2018-04-23 DIAGNOSIS — Z87440 Personal history of urinary (tract) infections: Secondary | ICD-10-CM | POA: Diagnosis not present

## 2018-04-23 DIAGNOSIS — F319 Bipolar disorder, unspecified: Secondary | ICD-10-CM

## 2018-04-23 DIAGNOSIS — K219 Gastro-esophageal reflux disease without esophagitis: Secondary | ICD-10-CM | POA: Diagnosis not present

## 2018-04-23 DIAGNOSIS — F419 Anxiety disorder, unspecified: Secondary | ICD-10-CM | POA: Diagnosis not present

## 2018-04-23 DIAGNOSIS — R21 Rash and other nonspecific skin eruption: Secondary | ICD-10-CM

## 2018-04-23 DIAGNOSIS — D649 Anemia, unspecified: Secondary | ICD-10-CM | POA: Diagnosis not present

## 2018-04-23 MED ORDER — BACITRACIN ZINC 500 UNIT/GM EX OINT: TOPICAL_OINTMENT | CUTANEOUS | 0 refills | Status: DC

## 2018-04-23 NOTE — Progress Notes (Signed)
   CC: Rash  HPI:  [name] is a 57 y.o. past medical history of GERD, depression, anxiety, anemia, hypotension and recurrent UTIs, presented with rash on her neck ,arms and thighs. She reports blister on her neck a week ago after sctratching hard her neck. She went to [facility] ED (due to SOB) and asked the ED physician to check the lesion on her neck and was prescribed bacitracin zinc with some improvement.  She then applied some topical cortisone and she believes that she got a reaction to that, made her having more rashes. She denies fever known insect bite, endorse using a new soap, otherwise no new chemical She smokes marijuana every day and she thinks it might have given her some rash this time.  Past Medical History:  Diagnosis Date  . Anemia   . Depression    bipolar  . GERD (gastroesophageal reflux disease)   . History of migraine headaches    headaches reported as migraines  . Hypotension   . Obesity   . Palpitations    negative cardiac w/u per Dr. Verl Blalock 2/09  . Perimenopause   . Reflux esophagitis   . Sleep apnea    No CPAP   Review of Systems:    Physical Exam: There were no vitals filed for this visit. Vitals:   04/23/18 0912  BP: 99/65  Pulse: 66  Temp: 98.7 F (37.1 C)  SpO2: 100%   Physical Exam  Constitutional: She is oriented to person, place, and time. She appears well-developed and well-nourished. No distress.  Cardiovascular: Normal rate, regular rhythm, normal heart sounds and intact distal pulses. Exam reveals no gallop and no friction rub.  No murmur heard. Pulmonary/Chest: Effort normal and breath sounds normal. No stridor. No respiratory distress. She has no wheezes. She has no rales.  Abdominal: Soft. Bowel sounds are normal. She exhibits no distension. There is no tenderness. There is no rebound and no guarding.  Musculoskeletal: Normal range of motion. She exhibits no edema, tenderness or deformity.  Lymphadenopathy:    She has no  cervical adenopathy.  Neurological: She is alert and oriented to person, place, and time.  Skin: Skin is warm and dry. Scar of healed lesions (about 2x1 inches and 0.5x0.5 inches)on her neckRash/hives noted on flexor parts of arms and anterior tights. No erythema.  Psychiatric: She has a normal mood and affect.   Assessment & Plan:  [name] is a 57 y/o F with PMH of GERD, Hypotension, anemia, anxiety and depression, presented with rash. 1-Rash She has had similar presentations before.   Denies any drainage from the lesions.  On exam she has healed scarlike lesions on her neck (About 1.5x1.5 inches at left side and 0.5 x 0.5 inches at rt side) and few eczema / hive lesions on flexor parts of her arms and anterior aspects of her thighs. She does smoke marijuana daily and reports she might have smoked a new product this time. Also reports using a new soap. Likely to be eczame that initially complicated to blister 2/2 scratching. How ever patient has had recurrent lesions with no specific exposure to known allergen before  -Bacitracin-zinc cream -Hydrocortisone cream -referral to dermatologist  2- Bipolar disorder and anxiety: Stable, she is not on nay medications. Goes to therapy  -Continue therapy   See Encounters Tab for problem based charting.  Patient  seen with Dr. Evette Doffing.

## 2018-04-23 NOTE — Patient Instructions (Addendum)
Ms. Mullens, It was our pleasure to visit you in our clinic today. You were seen due to rash and lesions on your neck. Please apply hydrocortisone cream on the lesions on your arms and Bacitracin Zinc on the lesions on your neck. We also recommend you to be seen by a dermatologist, as you have had recurrent lesions. Please let us know if you have any question or concern. Thanks, Dr. Linna Hoff

## 2018-04-24 NOTE — Progress Notes (Signed)
Internal Medicine Clinic Attending  I saw and evaluated the patient.  I personally confirmed the key portions of the history and exam documented by Dr. Masoudi and I reviewed pertinent patient test results.  The assessment, diagnosis, and plan were formulated together and I agree with the documentation in the resident's note. 

## 2018-05-06 ENCOUNTER — Telehealth: Payer: Self-pay | Admitting: *Deleted

## 2018-05-07 ENCOUNTER — Ambulatory Visit: Payer: Medicare HMO

## 2018-05-07 ENCOUNTER — Ambulatory Visit: Payer: Medicare HMO | Admitting: Internal Medicine

## 2018-05-07 ENCOUNTER — Other Ambulatory Visit: Payer: Self-pay

## 2018-05-07 DIAGNOSIS — R21 Rash and other nonspecific skin eruption: Secondary | ICD-10-CM | POA: Insufficient documentation

## 2018-05-07 NOTE — Patient Instructions (Addendum)
Thank you for letting us taking care of you in our clinic today. You were seen due to your concern about your previous HIV test and rash. Your HIV test 2 months ago was negative. As we talked, please try to use condom and avoid high risk activity that may put you on risk for HIV transmission. If having any concern, you can come back to clinic or health department for another test as needed.  Your rash and skin lesion on your neck looks pretty improved comparing to last visit. As we talked, using Clorox on your skin has made it irritated last week. please do not put such chemical on your skin. There is no active rash or infection on your skin currently but you can continue using Zinc ointment as needed. Please let us know if you have any concern and come back to our clinic if your rash reoccurs or as needed.  Thanks, Dr. Linna Hoff

## 2018-05-07 NOTE — Assessment & Plan Note (Signed)
Hx of rash:   Patient was seen 2 weeks ago due to rash. Initially responded to our treatment then she put Clorax on her neck skin that made her skin irritated. Currently is improved and healing. No sign on active excema or infection on her skin. Recommended her to not put any chemical or detergent on her skin and just keep using Zinc ointment. She also reports that she take a shower 6 times a days and use hot water and soap every times.   -Limit number of showers per day to to 1-2 times as using hot water and soap multiple times per day make your skin dry and irritated.  -Continue zinc ointment as needed

## 2018-05-07 NOTE — Progress Notes (Signed)
   CC: Lab result consult/follow up for rash  HPI:  [name] is a 57 y.o. with past medical Hx as below, came in due to concern about her HIV result that performed 2 months ago in [place] [facility]. She als was seen here this month due to rash. She states that the rash improved with Bacitracin Zinc. How ever she applied some Clorox bleech on her skin to make it clean! And It made her skin irritated few days ago. Also mentions that she take showers with hot water and soap 6 times per day.  She denies any rash any where else. Does not have any other complaint today.  Past Medical History:  Diagnosis Date  . Anemia   . Depression    bipolar  . GERD (gastroesophageal reflux disease)   . History of migraine headaches    headaches reported as migraines  . Hypotension   . Obesity   . Palpitations    negative cardiac w/u per Dr. Verl Blalock 2/09  . Perimenopause   . Reflux esophagitis   . Sleep apnea    No CPAP   Review of Systems:  Negative except as mentioned in H&P.   Physical Exam:  Vitals:   05/07/18 1542  BP: 118/71  Pulse: 81  Temp: 97.8 F (36.6 C)  TempSrc: Oral  SpO2: 100%  Weight: 162 lb 1.6 oz (73.5 kg)  Height: 5' 5.5" (1.664 m)   Constitutional: Alert and oriented in no acute distress. Cardiovascular: RRR, no murmur, gallops or rub Lungs: Normal afford. clear to auscultation bilaterally Abdomen is soft and non-tender. Nl BS Skin: is dry.  Neck lesion is healed comparing to last encounter Extremities: No rash. Pulses are normal bilaterally   Assessment & Plan:   She was worried if this rash is due to HIV. Asks about the result of her HIV test 2 months ago. Informed her that her HIV test was non-reactive.  Patient left before being seen by attending.

## 2018-05-13 NOTE — Telephone Encounter (Signed)
Review meds 

## 2018-05-15 ENCOUNTER — Encounter: Payer: Self-pay | Admitting: Internal Medicine

## 2018-05-15 ENCOUNTER — Other Ambulatory Visit: Payer: Self-pay

## 2018-05-15 ENCOUNTER — Ambulatory Visit (INDEPENDENT_AMBULATORY_CARE_PROVIDER_SITE_OTHER): Payer: Medicare HMO | Admitting: Internal Medicine

## 2018-05-15 VITALS — BP 103/63 | HR 70 | Temp 98.6°F | Ht 65.5 in | Wt 157.8 lb

## 2018-05-15 DIAGNOSIS — Z87440 Personal history of urinary (tract) infections: Secondary | ICD-10-CM | POA: Diagnosis not present

## 2018-05-15 DIAGNOSIS — R3916 Straining to void: Secondary | ICD-10-CM

## 2018-05-15 DIAGNOSIS — R3121 Asymptomatic microscopic hematuria: Secondary | ICD-10-CM | POA: Diagnosis not present

## 2018-05-15 DIAGNOSIS — N898 Other specified noninflammatory disorders of vagina: Secondary | ICD-10-CM

## 2018-05-15 LAB — POCT URINALYSIS DIPSTICK
BILIRUBIN UA: NEGATIVE
Glucose, UA: NEGATIVE
KETONES UA: NEGATIVE
Leukocytes, UA: NEGATIVE
Nitrite, UA: NEGATIVE
PROTEIN UA: NEGATIVE
Spec Grav, UA: 1.025 (ref 1.010–1.025)
Urobilinogen, UA: 0.2 E.U./dL
pH, UA: 5.5 (ref 5.0–8.0)

## 2018-05-15 NOTE — Addendum Note (Signed)
Addended by: Gilles Chiquito B on: 05/15/2018 05:33 PM   Modules accepted: Level of Service

## 2018-05-15 NOTE — Patient Instructions (Signed)
Thank you for visiting clinic today. Good news is that you are not having any obstruction in your bladder. As you already established with urologist, please make an appointment to get reevaluation. Keep yourself well hydrated. Follow-up if your symptoms get worse or you develop any new symptoms.

## 2018-05-15 NOTE — Progress Notes (Signed)
   CC: Difficulty with urination.  HPI:  [name] is a 57 y.o. with no significant past medical history came to the clinic with complaint of having difficulty with urination for the past 4 to 5 days.  Guarding to patient she is straining a lot to pee which causes lower back pain after micturition.  She does feel that she is not emptying her bladder completely each time she pees.  She denies any incontinence.  Denies any gross hematuria but states that she is having microscopic hematuria for many years, has seen urologist with multiple cystoscopes and each time they were normal. She denies any urgency, increased frequency or burning.  She was also complaining of increased whitish vaginal discharge for the past 1 week and went to health department 4 days ago where she had a vaginal swab done and HIV testing and everything was negative according to her report.  Denies any foul-smelling or vaginal pruritus.  Past Medical History:  Diagnosis Date  . Anemia   . Depression    bipolar  . GERD (gastroesophageal reflux disease)   . History of migraine headaches    headaches reported as migraines  . Hypotension   . Obesity   . Palpitations    negative cardiac w/u per Dr. Verl Blalock 2/09  . Perimenopause   . Reflux esophagitis   . Sleep apnea    No CPAP   Review of Systems: Negative except mentioned in HPI.  Physical Exam:  Vitals:   05/15/18 1438  BP: 103/63  Pulse: 70  Temp: 98.6 F (37 C)  TempSrc: Oral  SpO2: 100%  Weight: 157 lb 12.8 oz (71.6 kg)  Height: 5' 5.5" (1.664 m)    General: Vital signs reviewed.  Patient is well-developed and well-nourished, in no acute distress and cooperative with exam.  Head: Normocephalic and atraumatic. Cardiovascular: RRR, S1 normal, S2 normal, no murmurs, gallops, or rubs. Pulmonary/Chest: Clear to auscultation bilaterally, no wheezes, rales, or rhonchi. Abdominal: Soft, non-tender, non-distended, BS +, no CVA tenderness. Extremities: No  lower extremity edema bilaterally,  pulses symmetric and intact bilaterally. No cyanosis or clubbing. Skin: Warm, dry and intact. No rashes or erythema. Psychiatric: Normal mood and affect. speech and behavior is normal. Cognition and memory are normal.  Assessment & Plan:   See Encounters Tab for problem based charting.  Patient discussed with Dr. Beryle Beams.

## 2018-05-15 NOTE — Progress Notes (Signed)
Patient left before being seen by attending.

## 2018-05-16 NOTE — Assessment & Plan Note (Signed)
Description of her symptoms were more like bladder outlet obstruction. Exam and symptoms are not consistent with nephrolithiasis. Patient has an history of normal cystoscope, done due to her persistent microscopic hematuria.  POC urine analysis was only positive for mild microscopic hematuria no sign of infection. Post void bladder scan shows 74 cc-not consistent with any bladder outlet obstruction or urinary retention Sometimes interstitial cystitis can cause mild microscopic hematuria, but she denies any urgency, frequency or incontinence. Her symptoms can be due to some pelvic wall weakness and sagging of bladder which is common at this age group. She is going to see her gynecologist next month-advised her to discuss with her. Also advised to follow-up with her urologist if her symptoms continue to get worse.

## 2018-05-16 NOTE — Progress Notes (Signed)
Medicine attending: Medical history, presenting problems, physical findings, and medications, reviewed with resident physician Dr Sumayya Amin on the day of the patient visit and I concur with her evaluation and management plan. 

## 2018-05-22 DIAGNOSIS — L308 Other specified dermatitis: Secondary | ICD-10-CM | POA: Diagnosis not present

## 2018-06-03 ENCOUNTER — Ambulatory Visit: Payer: Medicare HMO | Admitting: Obstetrics and Gynecology

## 2018-06-17 ENCOUNTER — Ambulatory Visit: Payer: Medicare HMO | Admitting: Student

## 2018-07-23 DIAGNOSIS — F3132 Bipolar disorder, current episode depressed, moderate: Secondary | ICD-10-CM | POA: Diagnosis not present

## 2018-09-05 ENCOUNTER — Ambulatory Visit: Payer: Medicare HMO | Admitting: Internal Medicine

## 2018-09-24 ENCOUNTER — Ambulatory Visit: Payer: Medicare HMO | Admitting: Nurse Practitioner

## 2018-10-06 DIAGNOSIS — R69 Illness, unspecified: Secondary | ICD-10-CM | POA: Diagnosis not present

## 2018-10-07 ENCOUNTER — Other Ambulatory Visit: Payer: Self-pay

## 2018-10-07 ENCOUNTER — Ambulatory Visit (INDEPENDENT_AMBULATORY_CARE_PROVIDER_SITE_OTHER): Payer: Medicare HMO | Admitting: Internal Medicine

## 2018-10-07 ENCOUNTER — Encounter: Payer: Self-pay | Admitting: Internal Medicine

## 2018-10-07 VITALS — BP 133/76 | HR 89 | Temp 98.4°F | Wt 158.6 lb

## 2018-10-07 DIAGNOSIS — R319 Hematuria, unspecified: Secondary | ICD-10-CM | POA: Diagnosis not present

## 2018-10-07 DIAGNOSIS — F22 Delusional disorders: Secondary | ICD-10-CM

## 2018-10-07 DIAGNOSIS — K219 Gastro-esophageal reflux disease without esophagitis: Secondary | ICD-10-CM | POA: Diagnosis not present

## 2018-10-07 DIAGNOSIS — R51 Headache: Secondary | ICD-10-CM | POA: Diagnosis not present

## 2018-10-07 DIAGNOSIS — Z87440 Personal history of urinary (tract) infections: Secondary | ICD-10-CM | POA: Diagnosis not present

## 2018-10-07 DIAGNOSIS — F3160 Bipolar disorder, current episode mixed, unspecified: Secondary | ICD-10-CM

## 2018-10-07 NOTE — Assessment & Plan Note (Addendum)
Nicole [name] presents to the clinic w/ complaints of 'worms' in her head and leg that she is afraid will hurt her. She has history of bipolar disorder based on chart review, which she denies. She is currently established with counseling but is not on any medications. She states she refuses any and all medications except for ibuprofen which she takes intermittently for her headaches. She does not endorse many of the key features of manic episode of bipolar disorder, such as distractibility, insomnia, grandiosity, flight of ideas, or increased goal-directed activities. She is well-groomed, independent and functionally adept. She is endorsing sensation of worms in her head and leg that appears to be more of sensory delusions. 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 Unfortunately, she refuses any type of pharmacological intervention and her condition is currently managed by counseling alone. She does not have any intention to harm self or others. She would benefit from psychiatry consult which she has refused in the past.   - Will have to focus on reassurance and establishing solid patient-provider relationship so she can be more amenable to further psychiatric work-up.

## 2018-10-07 NOTE — Assessment & Plan Note (Signed)
Nicole [name] presents with chronic unilateral headache describes as 'worms crawling in my head.' She suspects infectious etiology and is distressed about possibility of herself being contagious and/or receiving worms from others. She states her pain is well managed with ibuprofen but requests referral for infectious disease for further evaluation. She states [place] and has hx of worm infection. With these infections causing 'indentations of her skull and her leg.' Physical exam show benign neurological exam. No obvious deformity, tenderness, redness or warmth indicating infectious etiology. While suspicion for infectious etiology is low and she has history of migraines, and she follows with neurology who had ruled out any neurological abnormalities, she has the right to request 2nd opinion and referral for infectious disease will be provided with the caveat that this is for evaluation by a different provider.  - Infectious Disease referral - Provided reassurance that this most likely is not contagious or due to an infection - Offered blood test to screen for infectious etiology which was refused

## 2018-10-07 NOTE — Progress Notes (Addendum)
CC: Headache  HPI: [name] is a 58 y.o. pleasant lady w/ PMH of bipolar disorder, GERD, and recurrent UTIs presenting to the clinic with complaints of headache. She states this is a chronic issue that she has been dealing with for 20 years. She mentions that [place] and states she had prior hx of 'worm infections.' She states she can feel the worms crawling around on the left fronto-temporal region of her skull and the disease is causing indentation of her skull. She states her head shape is becoming misshapen as a result and would like consult to ID for evaluation. She is having significant distress as all prior work-up has been negative, including evaluation by neurology as well as CT head. She also believes she may be spreading through sexual contact as she believes others around her are losing hair because of this infection. She states she has similar disease in her leg and states [facility] had previously performed a biopsy of her calf that showed negative results as well.  Past Medical History:  Diagnosis Date  . Anemia   . Depression    bipolar  . GERD (gastroesophageal reflux disease)   . History of migraine headaches    headaches reported as migraines  . Hypotension   . Obesity   . Palpitations    negative cardiac w/u per Dr. Verl Blalock 2/09  . Perimenopause   . Reflux esophagitis   . Sleep apnea    No CPAP   Review of Systems: Review of Systems  Constitutional: Negative for chills, fever and malaise/fatigue.  HENT: Negative for hearing loss.   Eyes: Negative for blurred vision and pain.  Cardiovascular: Negative for chest pain, palpitations and leg swelling.  Skin: Negative for itching and rash.  Neurological: Positive for headaches. Negative for dizziness, tingling, sensory change and weakness.    Physical Exam: Vitals:   10/07/18 1428  BP: 133/76  Pulse: 89  Temp: 98.4 F (36.9 C)  TempSrc: Oral  SpO2: 99%  Weight: 158 lb 9.6 oz (71.9 kg)     Physical Exam  Constitutional: She appears well-developed and well-nourished. No distress.  HENT:  Head: Normocephalic and atraumatic.  No edema, redness, tenderness or rash of left fronto-temporal head region.  Cardiovascular: Normal rate, regular rhythm, normal heart sounds and intact distal pulses.  No murmur heard. Respiratory: Effort normal and breath sounds normal. She has no wheezes. She has no rales.  Musculoskeletal: Normal range of motion.        General: No tenderness, deformity or edema.  Neurological:  Neurologic exam: Mental status: A&Ox3 Cranial Nerves: II: PERRL III, IV, VI: Extra-occular motions intact bilaterally V, VII: Face symmetric, sensation intact in all 3 divisions  VIII: hearing normal to rubbing fingers bilaterally  IX, X: palate rises symmetrically XI: Head turn and shoulder shrug normal bilaterally  XII: tongue midline  Motor: Strength 5/5 on all upper and lower extremities, bulk muscle and tone are normal  Deep Tendon Reflexes: 2+ symmetric Gait:Normal Sensory: Light touch intact and symmetric bilaterally  Coordination: There is no dysmetria on finger-to-nose. Rapid alternating movement test normal. Psychiatric: Pleasant affect  Skin: Skin is warm and dry. No rash noted.     Assessment & Plan:   Bipolar disorder [name]) [name] presents to the clinic w/ complaints of 'worms' in her head and leg that she is afraid will hurt her. She has history of bipolar disorder based on chart review, which she denies. She  is currently established with counseling but is not on any medications. She states she refuses any and all medications except for ibuprofen which she takes intermittently for her headaches. She does not endorse many of the key features of manic episode of bipolar disorder, such as distractibility, insomnia, grandiosity, flight of ideas, or increased goal-directed  activities. She is well-groomed, independent and functionally adept. She is endorsing sensation of worms in her head and leg that appears to be more of sensory delusions. 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 Unfortunately, she refuses any type of pharmacological intervention and her condition is currently managed by counseling alone. She does not have any intention to harm self or others. She would benefit from psychiatry consult which she has refused in the past.   - Will have to focus on reassurance and establishing solid patient-provider relationship so she can be more amenable to further psychiatric work-up.  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 She states her pain is well managed with ibuprofen but requests referral for infectious disease for further evaluation. She states she [place] up [place] farm and has hx of worm infection. With these infections causing 'indentations of her skull and her leg.' Physical exam show benign neurological exam. No obvious deformity, tenderness, redness or warmth indicating infectious etiology. While suspicion for infectious etiology is low and she has history of migraines, and she follows with neurology who had ruled out any neurological abnormalities, she has the right to request 2nd opinion and referral for infectious disease will be provided with the caveat that this is for evaluation by a different provider.  - Infectious Disease referral - Provided reassurance that this most likely is not contagious or due to an infection - Offered blood test to screen for infectious etiology which was refused   Patient seen with Dr. Evette Doffing   -Gilberto Better, PGY1

## 2018-10-07 NOTE — Patient Instructions (Addendum)
Thank you for allowing [place] to provide your care today. Today we discussed your headaches    I have made a referral for infectious disease for you as you have requested for a second opinion.  Today we made no changes to your medications.    Please follow-up in 6 months.    Should you have any questions or concerns please call the internal medicine clinic at [phone].

## 2018-10-08 NOTE — Progress Notes (Signed)
Internal Medicine Clinic Attending  I saw and evaluated the patient.  I personally confirmed the key portions of the history and exam documented by Dr. Truman Hayward and I reviewed pertinent patient test results.  The assessment, diagnosis, and plan were formulated together and I agree with the documentation in the resident's note.  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 She thinks the affected areas are focal at her left scalp and left anterior shin, both of which appear normal to me. She has worried about this for 20 years, and reports disbelief in my assessment as well as previous doctor assessments she has had in the past. She denies mental illness and is unreceptive to antipsychotic medications at this time. She requested a second opinion from [place]. I asked if she will believe them if they tell her the same thing, and she said that would be more entrustable to her. I am hopeful Dr. Truman Hayward can build some trust with her over the coming years to help better treat this fixation.

## 2018-10-09 ENCOUNTER — Ambulatory Visit (INDEPENDENT_AMBULATORY_CARE_PROVIDER_SITE_OTHER): Payer: Medicare HMO | Admitting: Internal Medicine

## 2018-10-09 ENCOUNTER — Encounter: Payer: Self-pay | Admitting: Internal Medicine

## 2018-10-09 VITALS — BP 90/70 | HR 88 | Ht 64.5 in | Wt 161.1 lb

## 2018-10-09 DIAGNOSIS — R194 Change in bowel habit: Secondary | ICD-10-CM

## 2018-10-09 DIAGNOSIS — K429 Umbilical hernia without obstruction or gangrene: Secondary | ICD-10-CM | POA: Diagnosis not present

## 2018-10-09 DIAGNOSIS — M6208 Separation of muscle (nontraumatic), other site: Secondary | ICD-10-CM

## 2018-10-09 NOTE — Patient Instructions (Signed)
You have been scheduled for a colonoscopy. Please follow written instructions given to you at your visit today.  Please pick up your prep supplies at the pharmacy within the next 1-3 days. If you use inhalers (even only as needed), please bring them with you on the day of your procedure.   You have been scheduled for an appointment with Dr. Dalbert Batman  at [facility]. Your appointment is on 10/23/2018 at 8:30AM. Please arrive at 8:00AM for registration. Make certain to bring a list of current medications, including any over the counter medications or vitamins. Also bring your co-pay if you have one as well as your insurance cards. [facility] is located at [address], [address]. Should you need to reschedule your appointment, please contact them at [phone].   I appreciate the opportunity to care for you. Silvano Rusk, MD, [facility]

## 2018-10-09 NOTE — Progress Notes (Signed)
Nicole [name] 58 y.o. [birth year] [id number]  Assessment & Plan:   Encounter Diagnoses  Name Primary?  . Change in bowel habits Yes  . Umbilical hernia without obstruction and without gangrene   . Diastasis recti    Overall I doubt there is anything serious going on here.  For the change in bowel habits which sound like a symptomatic rectocele, I will do a a colonoscopy to clear the air and also to relieve her's considerable anxiety over this.  Functional rectal exam prior to the colonoscopy.  I will have her see Dr. Dalbert Batman of surgery again about her umbilical hernia though conservative management may be appropriate she is concerned about this she does seem to have symptoms suggesting intermittent temporary incarceration that is easily reducible.  The diastases recti does not need intervention most likely assuming I am correct about that diagnosis.  CC: Nicole Anis, MD Dr. Fanny Skates  Subjective:   Chief Complaint: Change in bowel habits, umbilical hernia  HPI The patient is a 58 year old African-American woman with chronic rectal pain, that have not correlated with any particular abnormality.  That not so much of her complaint today as it is worsening difficulty producing a stool where she intermittently has to press on the back wall of the vagina and also strains a great deal.  A colonoscopy in 2013 was negative.  She is not describing bleeding.  She is very worried about this change in bowel habits and is fearful of cancer. She also has a painful umbilical hernia.  She is describing intermittent firm tender protrusion at the umbilicus that sounds like it may be intermittently incarcerated.  She has seen a surgeon in the past but cannot remember who, subsequent to the visit we did determine that she has seen Dr. Dalbert Batman in the past.  She is also very worried about a midline mid abdominal bulge.  Allergies  Allergen Reactions  . Other Itching and Hives    Pain medication  ordered at [facility] in 2009 - Patient does not know name of medication  Pain medication given at [facility] approximately 2009 Pain medication ordered at [facility] in 2009 - Patient does not know name of medication  Pain medication given at [facility] approximately 2009   Current Meds  Medication Sig  . ibuprofen (ADVIL,MOTRIN) 200 MG tablet Take 200-400 mg by mouth as needed.   Past Medical History:  Diagnosis Date  . Anemia   . Depression    bipolar  . GERD (gastroesophageal reflux disease)   . History of migraine headaches    headaches reported as migraines  . Hypotension   . Obesity   . Palpitations    negative cardiac w/u per Dr. Verl Blalock 2/09  . Perimenopause   . Reflux esophagitis   . Sleep apnea    No CPAP   Past Surgical History:  Procedure Laterality Date  . COLONOSCOPY    . ECTOPIC PREGNANCY SURGERY    . EXPLORATORY LAPAROTOMY  2000   x2 (Dr. Kendall Flack)  . HERPES SIMPLEX VIRUS DFA    . TUBAL LIGATION  1987   Social History   Social History Narrative   Patient lives at home alone.   Caffeine Use: none         Epworth Sleepiness Scale = 12 (as of 09/13/2015)   family history includes Breast cancer in her maternal aunt; Diabetes in her mother and sister; Glaucoma in her mother; Heart attack in her maternal  grandfather, maternal grandmother, and mother; Heart disease in her mother; Hypertension in her mother and sister; Pancreatic cancer in her maternal uncle and another family member; Thyroid disease in her mother.   Review of Systems As per HPI  Objective:   Physical Exam BP 90/70 (BP Location: Left Arm, Patient Position: Sitting, Cuff Size: Normal)   Pulse 88   Ht 5' 4.5" (1.638 m) Comment: height measured without shoes  Wt 161 lb 2 oz (73.1 kg)   LMP 03/03/2016 (Approximate)   BMI 27.23 kg/m  NAD Anicteric Abd small umbilical hernia -just to to the right of the umbilicus bordering it.  Intermittently tender.  There is  no incarceration this is tiny but she is tender there.  There is a small diastases recti in the mid upper abdomen.  I do not detect any hernia there she is examined supine standing and also I had her do leg lift to tense the abdominal wall.  It is slightly tender.   Declined rectal exam today

## 2018-10-21 ENCOUNTER — Ambulatory Visit (INDEPENDENT_AMBULATORY_CARE_PROVIDER_SITE_OTHER): Payer: Medicare HMO | Admitting: Family Medicine

## 2018-10-21 ENCOUNTER — Ambulatory Visit (INDEPENDENT_AMBULATORY_CARE_PROVIDER_SITE_OTHER): Payer: Medicare HMO | Admitting: Infectious Disease

## 2018-10-21 ENCOUNTER — Other Ambulatory Visit (HOSPITAL_COMMUNITY)
Admission: RE | Admit: 2018-10-21 | Discharge: 2018-10-21 | Disposition: A | Discharge status: Home / Self Care | Payer: Medicare HMO | Source: Ambulatory Visit | Attending: Family Medicine | Admitting: Family Medicine

## 2018-10-21 ENCOUNTER — Other Ambulatory Visit: Payer: Self-pay | Admitting: Family Medicine

## 2018-10-21 ENCOUNTER — Encounter: Payer: Self-pay | Admitting: Family Medicine

## 2018-10-21 ENCOUNTER — Telehealth: Payer: Self-pay | Admitting: Family Medicine

## 2018-10-21 VITALS — BP 121/78 | HR 99 | Temp 97.8°F | Wt 159.0 lb

## 2018-10-21 VITALS — BP 134/78 | HR 79 | Ht 64.5 in | Wt 157.9 lb

## 2018-10-21 DIAGNOSIS — Z1231 Encounter for screening mammogram for malignant neoplasm of breast: Secondary | ICD-10-CM

## 2018-10-21 DIAGNOSIS — F22 Delusional disorders: Secondary | ICD-10-CM

## 2018-10-21 DIAGNOSIS — Z113 Encounter for screening for infections with a predominantly sexual mode of transmission: Secondary | ICD-10-CM

## 2018-10-21 DIAGNOSIS — R399 Unspecified symptoms and signs involving the genitourinary system: Secondary | ICD-10-CM | POA: Diagnosis not present

## 2018-10-21 DIAGNOSIS — Z1239 Encounter for other screening for malignant neoplasm of breast: Secondary | ICD-10-CM

## 2018-10-21 DIAGNOSIS — N898 Other specified noninflammatory disorders of vagina: Secondary | ICD-10-CM

## 2018-10-21 DIAGNOSIS — F329 Major depressive disorder, single episode, unspecified: Secondary | ICD-10-CM

## 2018-10-21 DIAGNOSIS — F32A Depression, unspecified: Secondary | ICD-10-CM

## 2018-10-21 DIAGNOSIS — Z8742 Personal history of other diseases of the female genital tract: Secondary | ICD-10-CM

## 2018-10-21 DIAGNOSIS — Z7251 High risk heterosexual behavior: Secondary | ICD-10-CM | POA: Diagnosis not present

## 2018-10-21 DIAGNOSIS — Z79899 Other long term (current) drug therapy: Secondary | ICD-10-CM

## 2018-10-21 DIAGNOSIS — B20 Human immunodeficiency virus [HIV] disease: Secondary | ICD-10-CM | POA: Diagnosis not present

## 2018-10-21 LAB — POCT URINALYSIS DIP (DEVICE)
BILIRUBIN URINE: NEGATIVE
Glucose, UA: NEGATIVE mg/dL
KETONES UR: NEGATIVE mg/dL
Nitrite: NEGATIVE
PH: 7 (ref 5.0–8.0)
PROTEIN: NEGATIVE mg/dL
SPECIFIC GRAVITY, URINE: 1.02 (ref 1.005–1.030)
Urobilinogen, UA: 0.2 mg/dL (ref 0.0–1.0)

## 2018-10-21 NOTE — Progress Notes (Signed)
Pt states that her urine smells strong & has had irritation. Has been having bowel problems also, also thinks bowel is entering her Vagina, may be causing BV.Pt states has a colonoscopy already scheduled.

## 2018-10-21 NOTE — Patient Instructions (Signed)
We will do blood work today  Once I have that back I will send rx for Truvada to [facility]  I would like you to see Nicole [name] our ID pharmacist in one months time and then every 3 months for PrEP  If you would like we can also have you come see me in roughly 6 weeks to go over tests done today

## 2018-10-21 NOTE — Telephone Encounter (Signed)
The patient stated she already has a therapist she talks to. Did not want to schedule an appointment with Roselyn Reef.

## 2018-10-21 NOTE — Progress Notes (Signed)
Subjective:    Nicole [name] - 58 y.o. female MRN [id number]  Date of birth: 1960/11/29  HPI  Nicole [name] is a 58 y.o. [phone] female here for vaginal irritation.  "Inside" of vagina has some pain. Since menopause thought it was vaginal dryness from that. Having sex about once a week, which makes pain worse.  Also having vaginal discharge. Whitish in the lining of panties. About 3 months duration. Having some itching and burning as well. Thinks that sex may be causing some tears that are contributing to symptoms.   Having intercourse with a partner who she does not want to be with and is afraid of. Thinks that he is having sex with other men and women. Concerned that he is giving her sexually transmitted infections. Partner has told her that if she tries to leave, he will harm her or her family members, so she has decided to stay with him. Declines discussion with social work or other resources for assistance.     OB History    Gravida  4   Para  3   Term  3   Preterm  0   AB  1   Living  3     SAB  0   TAB  0   Ectopic  1   Multiple  0   Live Births  3             Health Maintenance:   There are no preventive care reminders to display for this patient.  -  reports that she has been smoking cigarettes. She has a 9.00 pack-year smoking history. She has never used smokeless tobacco. - Review of Systems: Per HPI. - Past Medical History: Patient Active Problem List   Diagnosis Date Noted  . Routine screening for STI (sexually transmitted infection) 02/17/2018  . History of recurrent UTI (urinary tract infection) 11/12/2017  . Marijuana use 11/12/2017  . Breast nodule 04/19/2017  . OSA (obstructive sleep apnea) 12/27/2015  . 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 Post-menopausal bleeding 12/27/2015  . Gastroesophageal reflux disease 12/16/2009  . Bipolar disorder ([place]) 04/14/2007   - Medications: reviewed and updated   Objective:   Physical Exam BP  134/78   Pulse 79   Ht 5' 4.5" (1.638 m)   Wt 157 lb 14.4 oz (71.6 kg)   LMP 03/03/2016 (Approximate)   BMI 26.68 kg/m  Gen: NAD, alert, cooperative with exam, well-appearing HEENT: NCAT, PERRL, clear conjunctiva, oropharynx clear, supple neck CV: RRR, good S1/S2, no murmur, no edema, capillary refill brisk  Resp: CTABL, no wheezes, non-labored Abd: SNTND, BS present, no guarding or organomegaly Skin: no rashes, normal turgor  Neuro: no gross deficits.  Psych: good insight, alert and oriented Breast: L breast with no skin changes, masses or nipple discharge. R breast with less than 1cm lump at 6'oclock position. No skin changes or nipple discharge.  GU/GYN: Exam performed in the presence of a chaperone. External genitalia within normal limits.  Vaginal mucosa pink, moist, normal rugae.  Small amount of white discharge. Nonfriable cervix without lesions or bleeding noted on speculum exam.  Bimanual exam revealed normal, nongravid uterus.  No cervical motion tenderness. No adnexal masses bilaterally.       Assessment & Plan:   1. UTI symptoms - UA - Urine Culture  2. Discharge from the vagina - Cervicovaginal ancillary only( [place])  3. Screening breast examination - small lump in R beast at [facility] - refer for  mammo  4. Depression, unspecified depression type - Ambulatory referral to [place]   Routine preventative health maintenance measures emphasized. Please refer to After Visit Summary for other counseling recommendations.   No follow-ups on file.  Aura Camps, MD  OB Fellow  10/21/2018, 1:48 PM

## 2018-10-21 NOTE — Progress Notes (Signed)
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 Truman Hayward  Subjective:    Patient ID: Nicole [name], female    DOB: 07/26/1961, 58 y.o.   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  Leaves that her body has been infected by worms/parasites that she is experienced coming out of her nose migrating through her eyes coming out of the vagina crawling through her skin including her legs and her hair causing her to lose hair and "eating out from the inside.  She has been suffering from the symptoms for what sounds like 10 if not 15 years.  She is convinced that she contracted this parasitic infection through sexual intercourse with another man.  She has been worked up and assured by her primary care physician but has desired consultation with an infectious disease physician.  [place] and was at times walking bare[place] according to my history that I got from her.  Assured her that her history though was not compatible with known parasitic infections in the region.  I told her I was willing however to screen for strongyloidiasis since this parasite does exist in the [place] I did again tell her that her symptoms would not fit with that particular organism.  She is in sexual relations with another man and had sex 10 days ago.  She is concerned that that man is having sex with other men.  I strongly recommended that we retest her for HIV and place her on Truvada for preexposure prophylaxis.  She is at very high risk being an African-American heterosexual woman in particular if she her boyfriend is having sex with other men or other partners.    Past Medical History:  Diagnosis Date  . Anemia   . Depression    bipolar  . GERD  (gastroesophageal reflux disease)   . History of migraine headaches    headaches reported as migraines  . Hypotension   . Obesity   . Palpitations    negative cardiac w/u per Dr. Verl Blalock 2/09  . Perimenopause   . Reflux esophagitis   . Sleep apnea    No CPAP    Past Surgical History:  Procedure Laterality Date  . COLONOSCOPY    . ECTOPIC PREGNANCY SURGERY    . EXPLORATORY LAPAROTOMY  2000   x2 (Dr. Kendall Flack)  . HERPES SIMPLEX VIRUS DFA    . TUBAL LIGATION  1987    Family History  Problem Relation Age of Onset  . Hypertension Mother   . Diabetes Mother   . Heart disease Mother   . Thyroid disease Mother   . Heart attack Mother   . Glaucoma Mother   . Pancreatic cancer Other        family history  . Heart attack Maternal Grandmother   . Heart attack Maternal Grandfather   . Breast cancer Maternal Aunt   . Pancreatic cancer Maternal Uncle   . Diabetes Sister   . Hypertension Sister   . Colon cancer Neg Hx       Social History   Socioeconomic History  . Marital status: Single    Spouse name: Not on file  . Number of children: 3  . Years of education: Not on file  . Highest education level: Not  on file  Occupational History  . Occupation: Agricultural consultant: UNEMPLOYED    Comment: Unemployed, on disability  Social Needs  . Financial resource strain: Not on file  . Food insecurity:    Worry: Not on file    Inability: Not on file  . Transportation needs:    Medical: Not on file    Non-medical: Not on file  Tobacco Use  . Smoking status: Current Every Day Smoker    Packs/day: 0.25    Years: 36.00    Pack years: 9.00    Types: Cigarettes    Last attempt to quit: 07/20/2011    Years since quitting: 7.2  . Smokeless tobacco: Never Used  . Tobacco comment: 2 cigs and 4 marijuana daily. wants to quit both.  Substance and Sexual Activity  . Alcohol use: Yes    Alcohol/week: 0.0 standard drinks    Comment: Rare glass of wine  . Drug  use: Yes    Types: Marijuana    Comment: daily marijuana use   . Sexual activity: Never  Lifestyle  . Physical activity:    Days per week: Not on file    Minutes per session: Not on file  . Stress: Not on file  Relationships  . Social connections:    Talks on phone: Not on file    Gets together: Not on file    Attends religious service: Not on file    Active member of club or organization: Not on file    Attends meetings of clubs or organizations: Not on file    Relationship status: Not on file  Other Topics Concern  . Not on file  Social History Narrative   Patient lives at home alone.   Caffeine Use: none         Epworth Sleepiness Scale = 12 (as of 09/13/2015)    Allergies  Allergen Reactions  . Other Itching and Hives    Pain medication ordered at [facility] in 2009 - Patient does not know name of medication  Pain medication given at [facility] approximately 2009 Pain medication ordered at [facility] in 2009 - Patient does not know name of medication  Pain medication given at [facility] approximately 2009     Current Outpatient Medications:  .  ibuprofen (ADVIL,MOTRIN) 200 MG tablet, Take 200-400 mg by mouth as needed., Disp: , Rfl:     Review of Systems  Constitutional: Negative for activity change, appetite change, chills, diaphoresis, fatigue, fever and unexpected weight change.  HENT: Negative for congestion, rhinorrhea, sinus pressure, sneezing, sore throat and trouble swallowing.   Eyes: Negative for photophobia and visual disturbance.  Respiratory: Negative for cough, chest tightness, shortness of breath, wheezing and stridor.   Cardiovascular: Negative for chest pain, palpitations and leg swelling.  Gastrointestinal: Positive for abdominal pain. Negative for abdominal distention, anal bleeding, blood in stool, constipation, diarrhea, nausea and vomiting.  Genitourinary: Negative for difficulty urinating, dysuria, flank pain and  hematuria.  Musculoskeletal: Positive for myalgias. Negative for arthralgias, back pain, gait problem and joint swelling.  Skin: Positive for rash. Negative for color change, pallor and wound.  Neurological: Negative for dizziness, tremors, weakness and light-headedness.  Hematological: Negative for adenopathy. Does not bruise/bleed easily.  Psychiatric/Behavioral: Negative for agitation, behavioral problems, confusion, decreased concentration, dysphoric mood, hallucinations and sleep disturbance. The patient is nervous/anxious. The patient is not hyperactive.        Objective:   Physical Exam Constitutional:  General: She is not in acute distress.    Appearance: Normal appearance. She is well-developed. She is not ill-appearing or diaphoretic.  HENT:     Head: Normocephalic and atraumatic.     Right Ear: Hearing and external ear normal.     Left Ear: Hearing and external ear normal.     Nose: No nasal deformity or rhinorrhea.     Mouth/Throat:     Mouth: Mucous membranes are moist.     Pharynx: Oropharynx is clear.  Eyes:     General: No scleral icterus.    Conjunctiva/sclera: Conjunctivae normal.     Right eye: Right conjunctiva is not injected.     Left eye: Left conjunctiva is not injected.     Pupils: Pupils are equal, round, and reactive to light.  Neck:     Musculoskeletal: Normal range of motion and neck supple.     Vascular: No JVD.  Cardiovascular:     Rate and Rhythm: Normal rate and regular rhythm.     Heart sounds: S1 normal and S2 normal.  Pulmonary:     Effort: Pulmonary effort is normal. No respiratory distress.     Breath sounds: No wheezing.  Abdominal:     General: There is no distension.     Palpations: Abdomen is soft.     Tenderness: There is no abdominal tenderness.  Musculoskeletal: Normal range of motion.     Right shoulder: Normal.     Left shoulder: Normal.     Right hip: Normal.     Left hip: Normal.     Right knee: Normal.     Left knee:  Normal.  Lymphadenopathy:     Head:     Right side of head: No submandibular, preauricular or posterior auricular adenopathy.     Left side of head: No submandibular, preauricular or posterior auricular adenopathy.     Cervical:     Right cervical: No superficial or deep cervical adenopathy.    Left cervical: No superficial or deep cervical adenopathy.  Skin:    General: Skin is warm and dry.     Coloration: Skin is not jaundiced or pale.     Findings: No abrasion, bruising, ecchymosis, erythema, lesion or rash.     Nails: There is no clubbing.   Neurological:     Mental Status: She is alert and oriented to person, place, and time.     Sensory: No sensory deficit.     Coordination: Coordination normal.     Gait: Gait normal.  Psychiatric:        Attention and Perception: Attention normal. She is attentive.        Mood and Affect: Mood is anxious.        Speech: Speech normal.        Behavior: Behavior is cooperative.        Thought Content: Thought content is delusional. Thought content does not include homicidal or suicidal ideation. Thought content does not include homicidal plan.        Cognition and Memory: Cognition and memory normal.        Judgment: Judgment normal.           Assessment & Plan:   Delusional parasitosis:: I told her I would screen her for strongyloidiasis though even if she had this would not explain her symptoms.  Also explained to her that many people who suffer them his condition suffers from significant depression that needs to be treated.  I also counseled  her that occasionally delusional parasitosis can be a manifestation of a malignancy or HIV.  However HIV test is negative and I would be shocked if she had an underlying malignancy that went undiagnosed for the past 10 to 15 years.  I will recheck her for HIV as described below  High risk for HIV: Being an African-American female puts her already at baseline at 15-20 times the risk of a  Caucasian heterosexual female.  The fact that she is in a relationship with another man who she believes having sex with other men puts her at an even higher risk.  We will therefore check an HIV antibody and RNA today.  If these are negative I will send a prescription for Truvada to the [facility].  She did have concerns about the risks to her liver and kidney from Truvada which unfortunately becomes about by this ridiculous TV commercials that keep playing and unfortunately causing a lot of harm in the HIV treatment and prevention arena.  I counseled her that we have been using Truvada for several decades to treat and now prevent HIV.  Throughout those decades of millions of people getting Truvada is been an exceedingly small number that have ever suffered renal toxicity.  I certainly witnessed this myself personally and typically it is happened in the context of other renal insults though it can happen in isolation.  I did counsel her that we check labs at least every 3 months to ensure that her kidneys were safe and that she does not have HIV and to screen for other sexually transmitted infections.  She does not appear to be on any nephrotoxic drugs and the risk of nephrotoxicity Truvada and her would be very low.  I counseled her to make sure she is well-hydrated if she is worried about that and that we will follow her closely.  Also check a hepatitis B surface antibody to see if she needs vaccination for hepatitis B in.  History of genital herpes: She also told me at the end of the interview that she is however the genital herpes but was not interested in being on Valtrex to prevent recurrent episodes and wanted to be on some other medication but I never heard of.  I spent greater than 55 minutes with the patient including greater than 50% of time in face to face counsel of the patient guarding her symptoms which are consistent with delusional parasitosis her risk for contracting HIV  how we prevent HIV with Truvada in a highly efficacious manner above 90% efficacy with the patient being adherent to the medications reviewing the known toxicities but putting in the context of real world data and not the exaggerations of TV commercials and in coordination of her care.

## 2018-10-22 LAB — URINE CULTURE

## 2018-10-23 ENCOUNTER — Telehealth: Payer: Self-pay | Admitting: Infectious Disease

## 2018-10-23 DIAGNOSIS — K432 Incisional hernia without obstruction or gangrene: Secondary | ICD-10-CM | POA: Diagnosis not present

## 2018-10-23 DIAGNOSIS — Z8759 Personal history of other complications of pregnancy, childbirth and the puerperium: Secondary | ICD-10-CM | POA: Diagnosis not present

## 2018-10-23 DIAGNOSIS — F319 Bipolar disorder, unspecified: Secondary | ICD-10-CM | POA: Diagnosis not present

## 2018-10-23 LAB — CERVICOVAGINAL ANCILLARY ONLY
Bacterial vaginitis: NEGATIVE
Candida vaginitis: NEGATIVE
Chlamydia: NEGATIVE
Neisseria Gonorrhea: NEGATIVE
Trichomonas: NEGATIVE

## 2018-10-23 MED ORDER — EMTRICITABINE-TENOFOVIR DF 200-300 MG PO TABS: 1.0000 | ORAL_TABLET | Freq: Every day | ORAL | 0 refills | Status: DC

## 2018-10-23 MED FILL — TRUVADA 200-300 MG TABS: 200-300 | 30 days supply | Qty: 30 | Fill #0

## 2018-10-23 NOTE — Telephone Encounter (Signed)
Since HIV antibody came back negative I will send in 30-day supply of Truvada she does have an HIV RNA quant pending but I do not think she has acute HIV.  If her RNA is somehow + would bring her in and get her on a full ARV regimen

## 2018-10-24 ENCOUNTER — Telehealth: Payer: Self-pay | Admitting: *Deleted

## 2018-10-24 LAB — COMPLETE METABOLIC PANEL WITH GFR
AG Ratio: 2.3 (calc) (ref 1.0–2.5)
ALBUMIN MSPROF: 4.6 g/dL (ref 3.6–5.1)
ALT: 9 U/L (ref 6–29)
AST: 11 U/L (ref 10–35)
Alkaline phosphatase (APISO): 52 U/L (ref 37–153)
BILIRUBIN TOTAL: 0.5 mg/dL (ref 0.2–1.2)
BUN: 13 mg/dL (ref 7–25)
CHLORIDE: 106 mmol/L (ref 98–110)
CO2: 30 mmol/L (ref 20–32)
CREATININE: 0.77 mg/dL (ref 0.50–1.05)
Calcium: 9.7 mg/dL (ref 8.6–10.4)
GFR, EST AFRICAN AMERICAN: 98 mL/min/{1.73_m2} (ref >=60)
GFR, Est Non African American: 85 mL/min/{1.73_m2} (ref >=60)
GLUCOSE: 106 mg/dL — AB (ref 65–99)
Globulin: 2 g/dL (calc) (ref 1.9–3.7)
Potassium: 4.1 mmol/L (ref 3.5–5.3)
Sodium: 143 mmol/L (ref 135–146)
TOTAL PROTEIN: 6.6 g/dL (ref 6.1–8.1)

## 2018-10-24 LAB — CBC WITH DIFFERENTIAL/PLATELET
Absolute Monocytes: 310 cells/uL (ref 200–950)
BASOS PCT: 0.5 %
Basophils Absolute: 22 cells/uL (ref 0–200)
EOS ABS: 159 {cells}/uL (ref 15–500)
EOS PCT: 3.7 %
HCT: 34.6 % — ABNORMAL LOW (ref 35.0–45.0)
Hemoglobin: 11.7 g/dL (ref 11.7–15.5)
Lymphs Abs: 1845 cells/uL (ref 850–3900)
MCH: 31.5 pg (ref 27.0–33.0)
MCHC: 33.8 g/dL (ref 32.0–36.0)
MCV: 93 fL (ref 80.0–100.0)
MONOS PCT: 7.2 %
MPV: 11.4 fL (ref 7.5–12.5)
Neutro Abs: 1965 cells/uL (ref 1500–7800)
Neutrophils Relative %: 45.7 %
PLATELETS: 187 10*3/uL (ref 140–400)
RBC: 3.72 10*6/uL — AB (ref 3.80–5.10)
RDW: 12.8 % (ref 11.0–15.0)
Total Lymphocyte: 42.9 %
WBC: 4.3 10*3/uL (ref 3.8–10.8)

## 2018-10-24 LAB — C-REACTIVE PROTEIN: CRP: 2.1 mg/L (ref <8.0)

## 2018-10-24 LAB — LACTATE DEHYDROGENASE: LDH: 155 U/L (ref 120–250)

## 2018-10-24 LAB — HIV ANTIBODY (ROUTINE TESTING W REFLEX): HIV: NONREACTIVE

## 2018-10-24 LAB — HEPATITIS B SURFACE ANTIBODY, QUANTITATIVE

## 2018-10-24 LAB — SEDIMENTATION RATE: Sed Rate: 9 mm/h (ref 0–30)

## 2018-10-24 LAB — HIV-1 RNA QUANT-NO REFLEX-BLD
HIV 1 RNA Quant: <20 copies/mL
HIV-1 RNA Quant, Log: <1.3 Log copies/mL

## 2018-10-24 LAB — STRONGYLOIDES ANTIBODY: Strongyloides IgG Antibody, ELISA: NEGATIVE

## 2018-10-24 NOTE — Telephone Encounter (Signed)
Patient would like to come pick up a letter stating her negative test results. Please advise. Landis Gandy, RN

## 2018-10-24 NOTE — Telephone Encounter (Signed)
I do not have time today to write that letter she may have to wait till I am back in town

## 2018-10-27 ENCOUNTER — Telehealth: Payer: Self-pay | Admitting: Internal Medicine

## 2018-10-27 DIAGNOSIS — H524 Presbyopia: Secondary | ICD-10-CM | POA: Diagnosis not present

## 2018-10-27 DIAGNOSIS — H52223 Regular astigmatism, bilateral: Secondary | ICD-10-CM | POA: Diagnosis not present

## 2018-10-27 NOTE — Telephone Encounter (Signed)
Pt is scheduled for colon tomorrow but stated that she has the flu, she wants to know if she needs to r/s. Pls call her.

## 2018-10-27 NOTE — Telephone Encounter (Signed)
Pt called to advise [place] that she is sick with possible flu.  Wants to reschedule colonoscopy and it is now booked for 11/12/18.  Pt states she had a pre-visit appt a week and a half ago.  Advised her to call us on Monday 11/10/18 to update prep procedure instructions.  She agreed and will comply.

## 2018-10-27 NOTE — Telephone Encounter (Signed)
Noted and agree. 

## 2018-10-28 ENCOUNTER — Encounter: Payer: Medicare HMO | Admitting: Internal Medicine

## 2018-10-29 ENCOUNTER — Telehealth: Payer: Self-pay

## 2018-10-29 ENCOUNTER — Other Ambulatory Visit: Payer: Self-pay | Admitting: General Surgery

## 2018-10-29 DIAGNOSIS — K432 Incisional hernia without obstruction or gangrene: Secondary | ICD-10-CM

## 2018-10-29 NOTE — Telephone Encounter (Signed)
Pt called requesting test results.  Called pt and pt informed me that someone had already informed her of her results.  Pt did not have any further questions or concerns.

## 2018-11-06 ENCOUNTER — Telehealth: Payer: Self-pay | Admitting: Family Medicine

## 2018-11-06 ENCOUNTER — Other Ambulatory Visit: Payer: Self-pay | Admitting: Family Medicine

## 2018-11-06 DIAGNOSIS — Z1239 Encounter for other screening for malignant neoplasm of breast: Secondary | ICD-10-CM

## 2018-11-06 DIAGNOSIS — N63 Unspecified lump in unspecified breast: Secondary | ICD-10-CM

## 2018-11-06 NOTE — Telephone Encounter (Signed)
I called Marciel and she reports she had a mammogram scheduled but it was cancelled and will be rescheduled until end of April or later. States she was told if it was diagnostic could get it sooner.  I informed her I will review with provider and call her back.  Per discussion with provider should be diagnostic since she has a lump. I entered the orders and called the [facility] to schedule.  Due to questions they must ask patient about travel, etc they will call her to screen her and schedule the appointment.

## 2018-11-06 NOTE — Telephone Encounter (Signed)
Patient is requesting a call back today.

## 2018-11-07 ENCOUNTER — Ambulatory Visit
Admission: RE | Admit: 2018-11-07 | Discharge: 2018-11-07 | Disposition: A | Discharge status: Home / Self Care | Payer: Medicare HMO | Source: Ambulatory Visit | Attending: General Surgery | Admitting: General Surgery

## 2018-11-07 DIAGNOSIS — K432 Incisional hernia without obstruction or gangrene: Secondary | ICD-10-CM | POA: Diagnosis not present

## 2018-11-07 MED ORDER — IOPAMIDOL (ISOVUE-300) INJECTION 61%
100.0000 mL | Freq: Once | INTRAVENOUS | Status: AC | PRN
  Administered 2018-11-07: 100 mL via INTRAVENOUS

## 2018-11-10 ENCOUNTER — Other Ambulatory Visit: Payer: Medicare HMO

## 2018-11-11 ENCOUNTER — Other Ambulatory Visit: Payer: Medicare HMO

## 2018-11-12 ENCOUNTER — Encounter: Payer: Medicare HMO | Admitting: Internal Medicine

## 2018-11-12 ENCOUNTER — Other Ambulatory Visit: Payer: Medicare HMO

## 2018-11-19 ENCOUNTER — Ambulatory Visit: Payer: Medicare HMO

## 2018-11-21 ENCOUNTER — Ambulatory Visit: Payer: Medicare HMO | Admitting: Pharmacist

## 2018-12-08 ENCOUNTER — Ambulatory Visit: Payer: Medicare HMO | Admitting: Infectious Disease

## 2018-12-16 ENCOUNTER — Telehealth: Payer: Self-pay | Admitting: *Deleted

## 2018-12-16 NOTE — Telephone Encounter (Signed)
Called patient to reschedule previously cancelled procedure due to Covid-19. Pt requests to wait to schedule until next round of calls as she prefers a morning appointment and availability at this time cannot be guaranteed it would not change again.

## 2018-12-29 ENCOUNTER — Telehealth: Payer: Self-pay | Admitting: *Deleted

## 2018-12-29 NOTE — Telephone Encounter (Signed)
Spoke with pt and rescheduled for 6/24 @ 1030.  Will send new prep RX to pharmacy and new prep instructions via mail.

## 2018-12-29 NOTE — Telephone Encounter (Signed)
New instructions mailed to pt.  Prep is Miralax and Dulcolax so pt will get those over the counter.

## 2019-01-08 ENCOUNTER — Telehealth: Payer: Self-pay | Admitting: Pharmacist

## 2019-01-08 NOTE — Telephone Encounter (Signed)
Patient called with questions regarding Truvada.  She stated that she wants to take it for a couple of weeks at a time and then stop. She is worried about her kidneys. She also states that she doesn't like taking medications at all.  I told her there was really no point in taking it a few weeks and then stopping on and off and to take it every day. I also explained the kidney issues (this has been probably the 3rd time I have explained this to her) and she wasn't satisfied with what I said. She isn't sure what she wants to do but she requested a follow-up visit with Dr. Tommy Medal.  I offered to see her but she didn't want to come in and see me.  Transferred her to the front desk to make an appointment.  She is out of refills and cannot get anymore until she is seen in clinic.

## 2019-01-08 NOTE — Telephone Encounter (Signed)
Fine to have her come see me. It is a pity that [place] not [facility] for women

## 2019-01-13 ENCOUNTER — Ambulatory Visit (INDEPENDENT_AMBULATORY_CARE_PROVIDER_SITE_OTHER): Payer: Medicare HMO | Admitting: Internal Medicine

## 2019-01-13 ENCOUNTER — Other Ambulatory Visit: Payer: Self-pay

## 2019-01-13 ENCOUNTER — Other Ambulatory Visit (HOSPITAL_COMMUNITY)
Admission: RE | Admit: 2019-01-13 | Discharge: 2019-01-13 | Disposition: A | Discharge status: Home / Self Care | Payer: Medicare HMO | Source: Ambulatory Visit | Attending: Internal Medicine | Admitting: Internal Medicine

## 2019-01-13 VITALS — BP 126/71 | HR 80 | Temp 98.6°F | Ht 64.5 in | Wt 163.9 lb

## 2019-01-13 DIAGNOSIS — R1032 Left lower quadrant pain: Secondary | ICD-10-CM

## 2019-01-13 DIAGNOSIS — N898 Other specified noninflammatory disorders of vagina: Secondary | ICD-10-CM | POA: Diagnosis not present

## 2019-01-13 DIAGNOSIS — G8929 Other chronic pain: Secondary | ICD-10-CM | POA: Diagnosis not present

## 2019-01-13 DIAGNOSIS — Z113 Encounter for screening for infections with a predominantly sexual mode of transmission: Secondary | ICD-10-CM

## 2019-01-13 NOTE — Progress Notes (Signed)
Internal Medicine Clinic Attending  Case discussed with Dr. Blum at the time of the visit.  We reviewed the resident's history and exam and pertinent patient test results.  I agree with the assessment, diagnosis, and plan of care documented in the resident's note. 

## 2019-01-13 NOTE — Assessment & Plan Note (Signed)
Patient describes yellow vaginal discharge for the past week. She denies vaginal pain, fever or chills.She has intermittent left lower quadrant pain which is chronic and stable. Patient is sexually active with one partner for the past 3 years, they do not use barrier protection and she believes that he has many other sexual partners. The discharge is foul smelling "sour".  -She declines free condoms available from our clinic today, says she has plenty at home.  - obtain testing for GC/chlamydia, trichomonas, BV, candida, HIV, Hep B and C

## 2019-01-13 NOTE — Patient Instructions (Signed)
Thank you for coming to the clinic today. It was a pleasure to see you.   Someone from the office will call you back with the results of your testing today Please call and let us know if you change your mind and would like to pick up some condoms    Please call the internal medicine center clinic if you have any questions or concerns, we may be able to help and keep you from a long and expensive emergency room wait. Our clinic and after hours phone number is [phone], the best time to call is Monday through Friday 9 am to 4 pm but there is always someone available 24/7 if you have an emergency. If you need medication refills please notify your pharmacy one week in advance and they will send [place] a request.

## 2019-01-13 NOTE — Progress Notes (Addendum)
   CC: evaluation of vaginal discharge   HPI:  [name] is a 58 y.o. with PMH as listed below who presents for evaluation of vaginal discharge . Please see the assessment and plans for the status of the patient chronic medical problems.   Past Medical History:  Diagnosis Date  . Anemia   . Depression    bipolar  . GERD (gastroesophageal reflux disease)   . History of migraine headaches    headaches reported as migraines  . Hypotension   . Obesity   . Palpitations    negative cardiac w/u per Dr. Verl Blalock 2/09  . Perimenopause   . Reflux esophagitis   . Sleep apnea    No CPAP   Review of Systems: Refer to history of present illness and assessment and plans for pertinent review of systems, all others reviewed and negative  Physical Exam:  Vitals:   01/13/19 1059  BP: 126/71  Pulse: 80  Temp: 98.6 F (37 C)  TempSrc: Oral  SpO2: 99%  Weight: 163 lb 14.4 oz (74.3 kg)  Height: 5' 4.5" (1.638 m)   General: well appearing, not in acute distress  Abdomen: soft, non tender, non distended GU: There are no rashes or lesions of the labia or vaginal canal, there is a scant amount of white discharge, no odor appreciated  Assessment & Plan:   Vaginal discharge  Patient describes yellow vaginal discharge for the past week. She denies vaginal pain, fever or chills.She has intermittent left lower quadrant pain which is chronic and stable. Patient is sexually active with one partner for the past 3 years, they do not use barrier protection and she believes that he has many other sexual partners. The discharge is foul smelling "sour".  -She declines free condoms available from our clinic today, says she has plenty at home.  - obtain testing for GC/chlamydia, trichomonas, BV, candida, HIV, Hep B and C   Addendum: STI Testing negative. Hep B serologies consistent with non immunity, will ask front desk to schedule vaccination series.    See Encounters Tab for problem based  charting.  Patient discussed with Dr. Lynnae January

## 2019-01-14 LAB — HEPATITIS C ANTIBODY: Hep C Virus Ab: <0.1 s/co ratio (ref 0.0–0.9)

## 2019-01-14 LAB — CERVICOVAGINAL ANCILLARY ONLY
Bacterial vaginitis: NEGATIVE
Candida vaginitis: NEGATIVE
Chlamydia: NEGATIVE
Neisseria Gonorrhea: NEGATIVE
Trichomonas: NEGATIVE

## 2019-01-14 LAB — HEPATITIS B SURFACE ANTIGEN: Hepatitis B Surface Ag: NEGATIVE

## 2019-01-14 LAB — HIV ANTIBODY (ROUTINE TESTING W REFLEX): HIV Screen 4th Generation wRfx: NONREACTIVE

## 2019-02-09 ENCOUNTER — Ambulatory Visit: Payer: Medicare HMO

## 2019-02-10 ENCOUNTER — Telehealth: Payer: Self-pay | Admitting: Internal Medicine

## 2019-02-10 NOTE — Telephone Encounter (Signed)
Noted. No charge. 

## 2019-02-11 ENCOUNTER — Encounter: Payer: Medicare HMO | Admitting: Internal Medicine

## 2019-04-25 ENCOUNTER — Emergency Department (HOSPITAL_COMMUNITY): Payer: Medicare HMO

## 2019-04-25 ENCOUNTER — Other Ambulatory Visit: Payer: Self-pay

## 2019-04-25 ENCOUNTER — Emergency Department (HOSPITAL_COMMUNITY)
Admission: EM | Admit: 2019-04-25 | Discharge: 2019-04-25 | Disposition: A | Discharge status: Home / Self Care | Payer: Medicare HMO | Attending: Emergency Medicine | Admitting: Emergency Medicine

## 2019-04-25 ENCOUNTER — Encounter (HOSPITAL_COMMUNITY): Payer: Self-pay | Admitting: *Deleted

## 2019-04-25 DIAGNOSIS — M19012 Primary osteoarthritis, left shoulder: Secondary | ICD-10-CM | POA: Diagnosis not present

## 2019-04-25 DIAGNOSIS — M25512 Pain in left shoulder: Secondary | ICD-10-CM | POA: Diagnosis not present

## 2019-04-25 DIAGNOSIS — Z79899 Other long term (current) drug therapy: Secondary | ICD-10-CM | POA: Insufficient documentation

## 2019-04-25 DIAGNOSIS — R112 Nausea with vomiting, unspecified: Secondary | ICD-10-CM

## 2019-04-25 DIAGNOSIS — F1210 Cannabis abuse, uncomplicated: Secondary | ICD-10-CM | POA: Insufficient documentation

## 2019-04-25 DIAGNOSIS — F17210 Nicotine dependence, cigarettes, uncomplicated: Secondary | ICD-10-CM | POA: Diagnosis not present

## 2019-04-25 DIAGNOSIS — M19019 Primary osteoarthritis, unspecified shoulder: Secondary | ICD-10-CM | POA: Diagnosis not present

## 2019-04-25 DIAGNOSIS — R1013 Epigastric pain: Secondary | ICD-10-CM | POA: Diagnosis present

## 2019-04-25 LAB — COMPREHENSIVE METABOLIC PANEL
ALT: 12 U/L (ref 0–44)
AST: 13 U/L — ABNORMAL LOW (ref 15–41)
Albumin: 4.7 g/dL (ref 3.5–5.0)
Alkaline Phosphatase: 49 U/L (ref 38–126)
Anion gap: 8 (ref 5–15)
BUN: 15 mg/dL (ref 6–20)
CO2: 28 mmol/L (ref 22–32)
Calcium: 9.6 mg/dL (ref 8.9–10.3)
Chloride: 105 mmol/L (ref 98–111)
Creatinine, Ser: 0.82 mg/dL (ref 0.44–1.00)
GFR calc Af Amer: >60 mL/min (ref >60)
GFR calc non Af Amer: >60 mL/min (ref >60)
Glucose, Bld: 100 mg/dL — ABNORMAL HIGH (ref 70–99)
Potassium: 4.6 mmol/L (ref 3.5–5.1)
Sodium: 141 mmol/L (ref 135–145)
Total Bilirubin: 0.8 mg/dL (ref 0.3–1.2)
Total Protein: 7.7 g/dL (ref 6.5–8.1)

## 2019-04-25 LAB — CBC
HCT: 39.1 % (ref 36.0–46.0)
Hemoglobin: 12.3 g/dL (ref 12.0–15.0)
MCH: 30.4 pg (ref 26.0–34.0)
MCHC: 31.5 g/dL (ref 30.0–36.0)
MCV: 96.5 fL (ref 80.0–100.0)
Platelets: 205 10*3/uL (ref 150–400)
RBC: 4.05 MIL/uL (ref 3.87–5.11)
RDW: 13.4 % (ref 11.5–15.5)
WBC: 4.7 10*3/uL (ref 4.0–10.5)
nRBC: 0 % (ref 0.0–0.2)

## 2019-04-25 LAB — URINALYSIS, ROUTINE W REFLEX MICROSCOPIC
Bilirubin Urine: NEGATIVE
Glucose, UA: NEGATIVE mg/dL
Hgb urine dipstick: NEGATIVE
Ketones, ur: NEGATIVE mg/dL
Leukocytes,Ua: NEGATIVE
Nitrite: NEGATIVE
Protein, ur: NEGATIVE mg/dL
Specific Gravity, Urine: 1.023 (ref 1.005–1.030)
pH: 5 (ref 5.0–8.0)

## 2019-04-25 LAB — LIPASE, BLOOD: Lipase: 24 U/L (ref 11–51)

## 2019-04-25 MED ORDER — ONDANSETRON 4 MG PO TBDP: ORAL_TABLET | ORAL | 0 refills | Status: DC

## 2019-04-25 MED ORDER — ONDANSETRON HCL 4 MG/2ML IJ SOLN
4.0000 mg | Freq: Once | INTRAMUSCULAR | Status: DC
  Filled 2019-04-25: qty 2

## 2019-04-25 MED ORDER — SODIUM CHLORIDE 0.9 % IV BOLUS
1000.0000 mL | Freq: Once | INTRAVENOUS | Status: AC
  Administered 2019-04-25: 17:00:00 1000 mL via INTRAVENOUS

## 2019-04-25 NOTE — ED Triage Notes (Signed)
Pt reports abdominal pain and nasal congestion. Pt tried aspirin and lemon water, which she states helped. Pt also reports pain right left shoulder that is worse when lifting arm. Pt was told she has a mass under her left clavicle and is concerned is it causing pain.

## 2019-04-25 NOTE — ED Provider Notes (Signed)
[facility] DEPT Provider Note   CSN: [id number] Arrival date & time: 04/25/19  1158     History   Chief Complaint Chief Complaint  Patient presents with  . Abdominal Pain    HPI MANESHA SIMISON is a 58 y.o. female hx of GERD, here with abdominal pain, left scapular and shoulder pain.  States that she did eat a dip from another person that she did not know of yesterday. She was not sure if it was properly refrigerated.  She states that shortly afterwards, she has been having some epigastric pain and throughout several times.  Of note, patient states that she has been having left clavicle swelling for the last year or so.  Apparently she thought she had a mass under the left clavicle but states that she saw her oncologist who did not think it was a mass.  She did have previous x-rays but there was no mass mentioned on the CT chest or chest x-ray.  She denies any falls or injuries.      The history is provided by the patient.    Past Medical History:  Diagnosis Date  . Anemia   . Depression    bipolar  . GERD (gastroesophageal reflux disease)   . History of migraine headaches    headaches reported as migraines  . Hypotension   . Obesity   . Palpitations    negative cardiac w/u per Dr. Verl Blalock 2/09  . Perimenopause   . Reflux esophagitis   . Sleep apnea    No CPAP    Patient Active Problem List   Diagnosis Date Noted  . Routine screening for STI (sexually transmitted infection) 02/17/2018  . History of recurrent UTI (urinary tract infection) 11/12/2017  . Marijuana use 11/12/2017  . Breast nodule 04/19/2017  . OSA (obstructive sleep apnea) 12/27/2015  . 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 Post-menopausal bleeding 12/27/2015  . Gastroesophageal reflux disease 12/16/2009  . Bipolar disorder ([place]) 04/14/2007    Past Surgical History:  Procedure Laterality Date  . COLONOSCOPY    . ECTOPIC PREGNANCY SURGERY    . EXPLORATORY LAPAROTOMY   2000   x2 (Dr. Kendall Flack)  . HERPES SIMPLEX VIRUS DFA    . TUBAL LIGATION  1987     OB History    Gravida  4   Para  3   Term  3   Preterm  0   AB  1   Living  3     SAB  0   TAB  0   Ectopic  1   Multiple  0   Live Births  3            Home Medications    Prior to Admission medications   Medication Sig Start Date End Date Taking? Authorizing Provider  aspirin-acetaminophen-caffeine (EXCEDRIN MIGRAINE) [phone] MG tablet Take 1 tablet by mouth every 6 (six) hours as needed for headache.   Yes [provider]  Multiple Vitamin (MULTIVITAMIN) tablet Take 1 tablet by mouth at bedtime.   Yes [provider]  emtricitabine-tenofovir (TRUVADA) 200-300 MG tablet Take 1 tablet by mouth daily. Patient not taking: Reported on 04/25/2019 10/23/18   Tommy Medal, Lavell Islam, MD    Family History Family History  Problem Relation Age of Onset  . Hypertension Mother   . Diabetes Mother   . Heart disease Mother   . Thyroid disease Mother   . Heart attack Mother   . Glaucoma  Mother   . Pancreatic cancer Other        family history  . Heart attack Maternal Grandmother   . Heart attack Maternal Grandfather   . Breast cancer Maternal Aunt   . Pancreatic cancer Maternal Uncle   . Diabetes Sister   . Hypertension Sister   . Colon cancer Neg Hx     Social History Social History   Tobacco Use  . Smoking status: Current Every Day Smoker    Packs/day: 0.25    Years: 36.00    Pack years: 9.00    Types: Cigarettes    Last attempt to quit: 07/20/2011    Years since quitting: 7.7  . Smokeless tobacco: Never Used  . Tobacco comment: 2 cigs and 4 marijuana daily. wants to quit both.  Substance Use Topics  . Alcohol use: Yes    Alcohol/week: 0.0 standard drinks    Comment: Rare glass of wine  . Drug use: Yes    Types: Marijuana    Comment: daily marijuana use      Allergies   Other   Review of Systems Review of Systems  Gastrointestinal:  Positive for abdominal pain.  Musculoskeletal:       L clavicle pain   All other systems reviewed and are negative.    Physical Exam Updated Vital Signs BP (!) 142/81 (BP Location: Left Arm)   Pulse 95   Temp 98.5 F (36.9 C) (Oral)   Resp 18   LMP 03/03/2016 (Approximate)   SpO2 99%   Physical Exam Vitals signs and nursing note reviewed.  HENT:     Head: Normocephalic.  Eyes:     Extraocular Movements: Extraocular movements intact.  Cardiovascular:     Rate and Rhythm: Normal rate and regular rhythm.     Heart sounds: Normal heart sounds.  Pulmonary:     Effort: Pulmonary effort is normal.     Breath sounds: Normal breath sounds.     Comments: ? Soft tissue swelling around L clavicle, ? Enlarged lymph node but no palpable mass  Abdominal:     General: Abdomen is flat. Bowel sounds are normal.     Palpations: Abdomen is soft.     Comments: Minimal epigastric tenderness, no rebound   Skin:    General: Skin is warm.     Capillary Refill: Capillary refill takes less than 2 seconds.  Neurological:     General: No focal deficit present.     Mental Status: She is alert and oriented to person, place, and time.  Psychiatric:        Mood and Affect: Mood normal.        Behavior: Behavior normal.      ED Treatments / Results  Labs (all labs ordered are listed, but only abnormal results are displayed) Labs Reviewed  COMPREHENSIVE METABOLIC PANEL - Abnormal; Notable for the following components:      Result Value   Glucose, Bld 100 (*)    AST 13 (*)    All other components within normal limits  LIPASE, BLOOD  CBC  URINALYSIS, ROUTINE W REFLEX MICROSCOPIC    EKG None  Radiology No results found.  Procedures Procedures (including critical care time)  Medications Ordered in ED Medications  sodium chloride 0.9 % bolus 1,000 mL (has no administration in time range)  ondansetron (ZOFRAN) injection 4 mg (has no administration in time range)     Initial  Impression / Assessment and Plan / ED Course  I have reviewed  the triage vital signs and the nursing notes.  Pertinent labs & imaging results that were available during my care of the patient were reviewed by me and considered in my medical decision making (see chart for details).       Tincy Carstensen Otoole is a 58 y.o. female here with abdominal pain, left clavicle swelling.  Patient initially coming here for abdominal pain vomiting and appears like gastroenteritis symptoms after eating some food.  Abdomen soft and minimally tender in the epigastric area.  We will check some lab work and hydrate patient .  She is somewhat concerned about swelling around her left clavicle area which has been going on for the last year or so. She states that there was a mass that showed up on previous imaging but I reviewed her x-rays and CTs in our system and there was no mention of lung or soft tissue mass.  Appears like small lymph node or swelling of the soft tissue. Will get cxr to further assess.    5:35 PM Labs unremarkable. xrays showed no obvious lung mass. Previous imaging didn't show any mass. Stable for discharge. Likely viral gastro.    Final Clinical Impressions(s) / ED Diagnoses   Final diagnoses:  None    ED Discharge Orders    None       Drenda Freeze, MD 04/25/19 1739

## 2019-04-25 NOTE — Discharge Instructions (Signed)
Stay hydrated   Take zofran as needed for nausea.   If you have increased swelling of your left shoulder or clavicle area, then see your doctor for additional imaging   See your doctor  Return to ER if you have worse abdominal pain, vomiting, chest pain, trouble breathing

## 2019-04-25 NOTE — ED Notes (Signed)
Patient transported to X-ray 

## 2019-05-20 ENCOUNTER — Telehealth: Payer: Self-pay | Admitting: Internal Medicine

## 2019-05-20 NOTE — Telephone Encounter (Signed)
Pt states she smoked some weed a week ago cont daily until sat, none since Friday evening, she has never experience side effects before, she smokes weed chronically, she has lung pain, she thinks the weed is treated with something. Every time she smokes she has the lung pain and her throat is red, the top palette is yellow. She usually smokes weed daily and she is trying not to use it but needs it. When she drinks lemon water it eases the pain. ACC 10/1

## 2019-05-20 NOTE — Telephone Encounter (Signed)
Pt states her chest is hurting after smoking marijuana on 05/15/2019. Please call patient back.

## 2019-05-20 NOTE — Telephone Encounter (Signed)
Ok. Thanks!

## 2019-05-21 ENCOUNTER — Ambulatory Visit (INDEPENDENT_AMBULATORY_CARE_PROVIDER_SITE_OTHER): Payer: Medicare HMO | Admitting: Internal Medicine

## 2019-05-21 ENCOUNTER — Other Ambulatory Visit: Payer: Self-pay

## 2019-05-21 ENCOUNTER — Encounter: Payer: Self-pay | Admitting: Internal Medicine

## 2019-05-21 DIAGNOSIS — J189 Pneumonia, unspecified organism: Secondary | ICD-10-CM | POA: Insufficient documentation

## 2019-05-21 DIAGNOSIS — F1290 Cannabis use, unspecified, uncomplicated: Secondary | ICD-10-CM | POA: Diagnosis not present

## 2019-05-21 DIAGNOSIS — J704 Drug-induced interstitial lung disorders, unspecified: Secondary | ICD-10-CM

## 2019-05-21 DIAGNOSIS — T407X5A Adverse effect of cannabis (derivatives), initial encounter: Secondary | ICD-10-CM

## 2019-05-21 NOTE — Progress Notes (Signed)
Internal Medicine Clinic Attending  Case discussed with Dr. Melvin  at the time of the visit.  We reviewed the resident's history and exam and pertinent patient test results.  I agree with the assessment, diagnosis, and plan of care documented in the resident's note.  

## 2019-05-21 NOTE — Patient Instructions (Addendum)
Thank you for allowing [place] to care for you  Your symptoms are likely due to irritation from something in what you smoked. It is possible, but less likely, that you have a viral illness. - Continue to not smoke and try to avoid cold or dry air as best you can - Follow up as needed

## 2019-05-21 NOTE — Progress Notes (Signed)
   CC: "Lung Pain", Chest pain  HPI:  [name] is a 58 y.o. F with PMHx listed below presenting for Chest pain. Please see the A&P for the status of the patient's chronic medical problems.  Past Medical History:  Diagnosis Date  . Anemia   . Depression    bipolar  . GERD (gastroesophageal reflux disease)   . History of migraine headaches    headaches reported as migraines  . Hypotension   . Obesity   . Palpitations    negative cardiac w/u per Dr. Verl [name] 2/09  . Perimenopause   . Reflux esophagitis   . Sleep apnea    No CPAP   Review of Systems:  Performed and all others negative.  Physical Exam:  Vitals:   05/21/19 0832 05/21/19 0905  BP: (!) 114/91   Pulse: (!) 111 98  Temp: 98.3 F (36.8 C)   TempSrc: Oral   SpO2: 100%   Weight: 157 lb 3.2 oz (71.3 kg)    Physical Exam Constitutional:      General: She is not in acute distress.    Appearance: Normal appearance.  HENT:     Mouth/Throat:     Mouth: Mucous membranes are moist.     Pharynx: Oropharynx is clear. No oropharyngeal exudate or posterior oropharyngeal erythema.  Cardiovascular:     Rate and Rhythm: Normal rate and regular rhythm.     Pulses: Normal pulses.     Heart sounds: Normal heart sounds.  Pulmonary:     Effort: Pulmonary effort is normal. No respiratory distress.     Breath sounds: Normal breath sounds.  Abdominal:     General: Bowel sounds are normal. There is no distension.     Palpations: Abdomen is soft.     Tenderness: There is no abdominal tenderness.  Musculoskeletal:        General: No swelling or deformity.  Skin:    General: Skin is warm and dry.  Neurological:     General: No focal deficit present.     Mental Status: Mental status is at baseline.    Assessment & Plan:   See Encounters Tab for problem based charting.  Patient discussed with Dr. Evette Doffing

## 2019-05-21 NOTE — Assessment & Plan Note (Addendum)
Patient prsents with one week of deep, irritating chest pain that she states feels like is in the tubes of her lungs. The pain initially begtan after smoking marijuana. She states it was intially improved with honey in warm water, but would return when she would smoke marijuana again. She last smoked about 5-6 days ago and the pain has been persistent, but slowly improving since that time. She has stopped smoking and has tried drinking liquor mixed with honey. She states she has had a mild productive cough since drinking the alcohol/honey mixture and her pain has slowly improved since she stopped smoking.  On exam, vitals are stable (intially mildly tachycardic but this improved on recheck). Heart and lung exam is benign. Oral Pharynx WNL. She denies any fevers, chills, SOB, pain on exersion, palpitations, Nausea, vomiting, diarrhea, nor abdominal pain.  Her history and exam are consistent with mild pneumonitis due to irritant in the marijuana she smoked. It is encouraging that she has had slow but steady improvement since stopping smoking, her exam is benign, and she has had no SOB or desaturation. We will continue to monitor with supportive care and irritant avoidance.  - Abstain from smoking of any kind - Avoid cold dry air as moist warm air is less irritating - Stop with strong liquor and honey mixture as this may irritate esophagus and add additional symptoms, may continue with warm tea with honey if patient wishes - Return precautions given

## 2019-07-01 ENCOUNTER — Other Ambulatory Visit (HOSPITAL_COMMUNITY)
Admission: RE | Admit: 2019-07-01 | Discharge: 2019-07-01 | Disposition: A | Discharge status: Home / Self Care | Payer: Medicare HMO | Source: Ambulatory Visit | Attending: Obstetrics & Gynecology | Admitting: Obstetrics & Gynecology

## 2019-07-01 ENCOUNTER — Other Ambulatory Visit: Payer: Self-pay

## 2019-07-01 ENCOUNTER — Encounter: Payer: Self-pay | Admitting: Obstetrics & Gynecology

## 2019-07-01 ENCOUNTER — Ambulatory Visit (INDEPENDENT_AMBULATORY_CARE_PROVIDER_SITE_OTHER): Payer: Medicare HMO | Admitting: Obstetrics & Gynecology

## 2019-07-01 VITALS — BP 105/69 | HR 102 | Wt 162.3 lb

## 2019-07-01 DIAGNOSIS — Z113 Encounter for screening for infections with a predominantly sexual mode of transmission: Secondary | ICD-10-CM

## 2019-07-01 DIAGNOSIS — N898 Other specified noninflammatory disorders of vagina: Secondary | ICD-10-CM

## 2019-07-01 NOTE — Progress Notes (Signed)
   GYNECOLOGY OFFICE VISIT NOTE  History:   Nicole [name] is a 58 y.o. [phone] here today for evaluation of yellow discharge x 1 week. No irritation.  Wants STI screen. She denies any abnormal vaginal bleeding, pelvic pain or other concerns.    Past Medical History:  Diagnosis Date  . Anemia   . Depression    bipolar  . GERD (gastroesophageal reflux disease)   . History of migraine headaches    headaches reported as migraines  . Hypotension   . Obesity   . Palpitations    negative cardiac w/u per Dr. Verl Blalock 2/09  . Perimenopause   . Reflux esophagitis   . Sleep apnea    No CPAP    Past Surgical History:  Procedure Laterality Date  . COLONOSCOPY    . ECTOPIC PREGNANCY SURGERY    . EXPLORATORY LAPAROTOMY  2000   x2 (Dr. Kendall Flack)  . HERPES SIMPLEX VIRUS DFA    . TUBAL LIGATION  1987    The following portions of the patient's history were reviewed and updated as appropriate: allergies, current medications, past family history, past medical history, past social history, past surgical history and problem list.   Health Maintenance:  Normal pap and negative HRHPV on 02/08/2017.  Needs mammogram scheduled.   Review of Systems:  Pertinent items noted in HPI and remainder of comprehensive ROS otherwise negative.  Physical Exam:  BP 105/69   Pulse (!) 102   Wt 162 lb 4.8 oz (73.6 kg)   LMP 03/03/2016 (Approximate)   BMI 27.43 kg/m  CONSTITUTIONAL: Well-developed, well-nourished female in no acute distress.  HEENT:  Normocephalic, atraumatic. External right and left ear normal. No scleral icterus.  NECK: Normal range of motion, supple, no masses noted on observation SKIN: No rash noted. Not diaphoretic. No erythema. No pallor. MUSCULOSKELETAL: Normal range of motion. No edema noted. NEUROLOGIC: Alert and oriented to person, place, and time. Normal muscle tone coordination. No cranial nerve deficit noted. PSYCHIATRIC: Normal mood and affect. Normal behavior. Normal  judgment and thought content. CARDIOVASCULAR: Normal heart rate noted RESPIRATORY: Effort and breath sounds normal, no problems with respiration noted ABDOMEN: No masses noted. No other overt distention noted.   PELVIC: Normal appearing external genitalia; normal appearing distal vaginal mucosa. Yellow discharge noted, testing sample obtained.       Assessment and Plan:    1. Vaginal discharge 2. Screening for STD (sexually transmitted disease) Will follow up labs and manage accordingly. Safe sex practices recommended. - Cervicovaginal ancillary only( [place]) - HIV antibody - RPR - Hepatitis B Surface AntiGEN - Hepatitis C Antibody Routine preventative health maintenance measures emphasized; mammogram to be scheduled for patient. Please refer to After Visit Summary for other counseling recommendations.   Return for any gynecologic concerns.    Total face-to-face time with patient: 15 minutes.  Over 50% of encounter was spent on counseling and coordination of care.   Verita Schneiders, MD, [place] for [employer], [place]

## 2019-07-02 LAB — HIV ANTIBODY (ROUTINE TESTING W REFLEX): HIV Screen 4th Generation wRfx: NONREACTIVE

## 2019-07-02 LAB — CERVICOVAGINAL ANCILLARY ONLY
Bacterial Vaginitis (gardnerella): NEGATIVE
Candida Glabrata: NEGATIVE
Candida Vaginitis: NEGATIVE
Chlamydia: NEGATIVE
Comment: NEGATIVE
Comment: NEGATIVE
Comment: NEGATIVE
Comment: NEGATIVE
Comment: NEGATIVE
Comment: NORMAL
Neisseria Gonorrhea: NEGATIVE
Trichomonas: NEGATIVE

## 2019-07-02 LAB — RPR: RPR Ser Ql: NONREACTIVE

## 2019-07-02 LAB — HEPATITIS B SURFACE ANTIGEN: Hepatitis B Surface Ag: NEGATIVE

## 2019-07-02 LAB — HEPATITIS C ANTIBODY: Hep C Virus Ab: <0.1 s/co ratio (ref 0.0–0.9)

## 2019-07-03 ENCOUNTER — Telehealth (INDEPENDENT_AMBULATORY_CARE_PROVIDER_SITE_OTHER): Payer: Medicare HMO | Admitting: Family Medicine

## 2019-07-03 DIAGNOSIS — Z712 Person consulting for explanation of examination or test findings: Secondary | ICD-10-CM

## 2019-07-03 NOTE — Telephone Encounter (Signed)
Patient is requesting her results from her last visit.

## 2019-07-03 NOTE — Telephone Encounter (Signed)
Called pt to inform her that all her labs all came back normal and negative. Pt voiced understanding.

## 2019-08-09 DIAGNOSIS — Z03818 Encounter for observation for suspected exposure to other biological agents ruled out: Secondary | ICD-10-CM | POA: Diagnosis not present

## 2019-08-09 DIAGNOSIS — H60502 Unspecified acute noninfective otitis externa, left ear: Secondary | ICD-10-CM | POA: Diagnosis not present

## 2019-08-25 IMAGING — CT CT CHEST W/O CM
2 of 4 series · 15 of 36 positions shown, 18 images · non-contrast
Comparison: None.

CLINICAL DATA: Prominent left sternoclavicular joint

EXAM:
CT CHEST WITHOUT CONTRAST
TECHNIQUE: Multidetector CT imaging of the chest was performed following the
standard protocol without IV contrast.

[Series 4: thorax 2.0 · axial · 0.77mm/px · z∈[+1540,+1816]mm · 12 of 162 slices shown, 15 images]
[im 12/162  mediastinal]
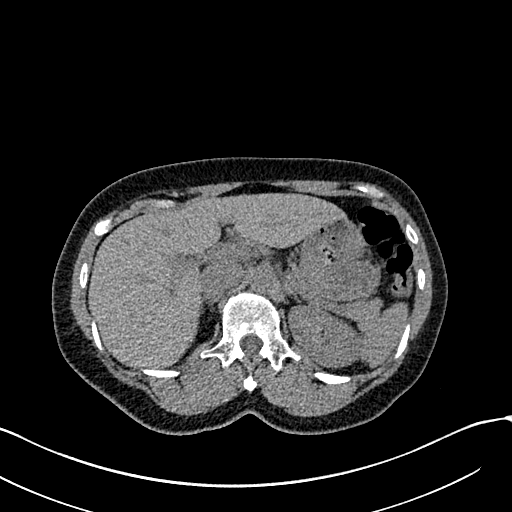
[im 12/162  lung]
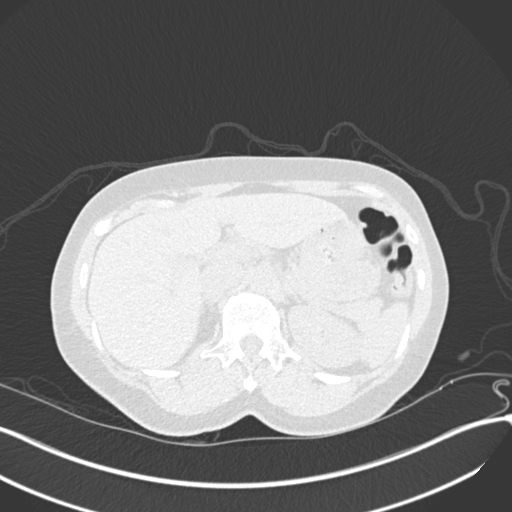
[im 24/162  lung]
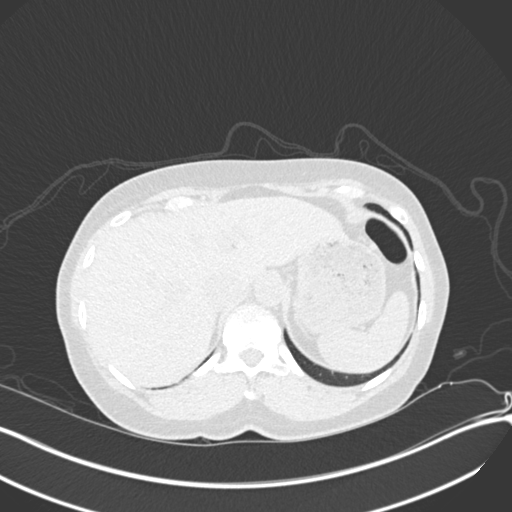
[im 35/162  lung]
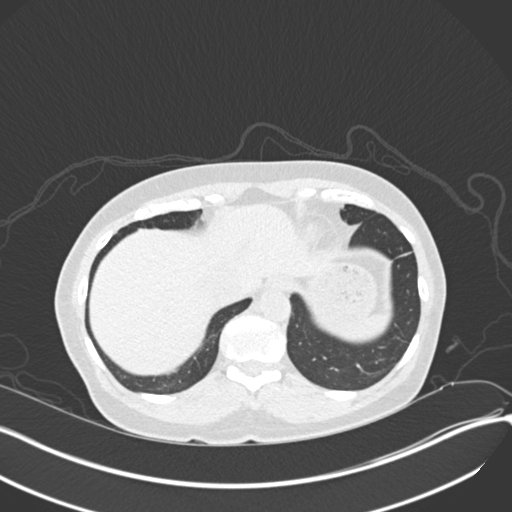
[im 47/162  lung]
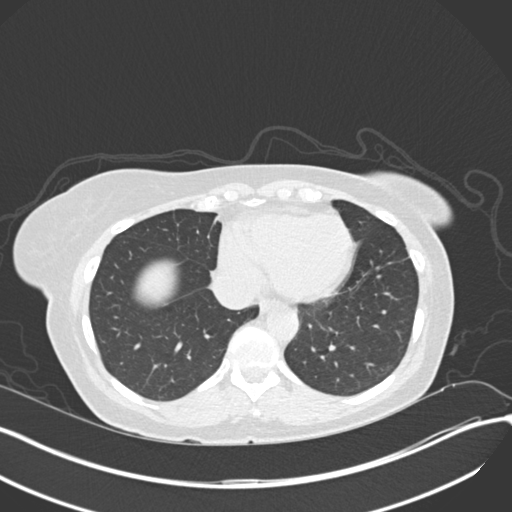
[im 58/162  mediastinal]
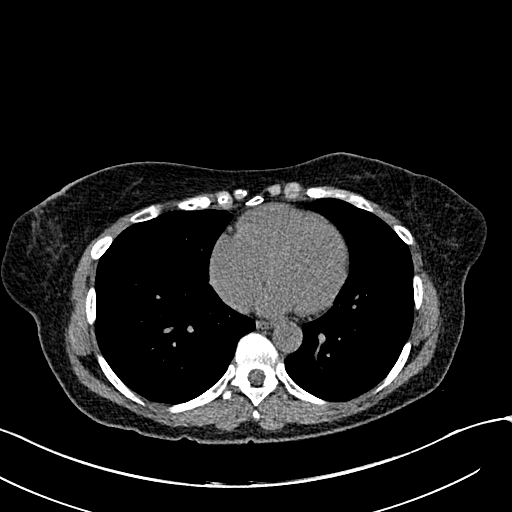
[im 58/162  lung]
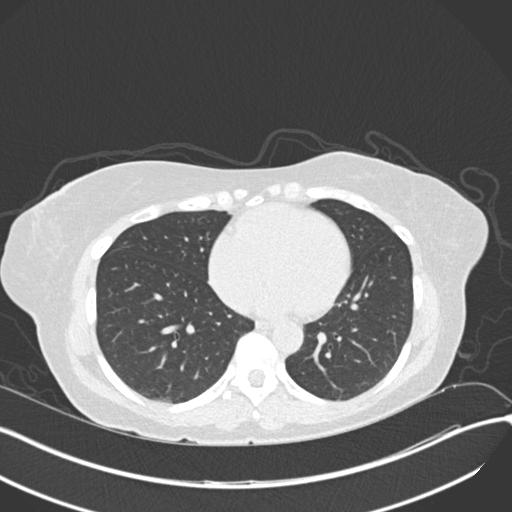
[im 70/162  lung]
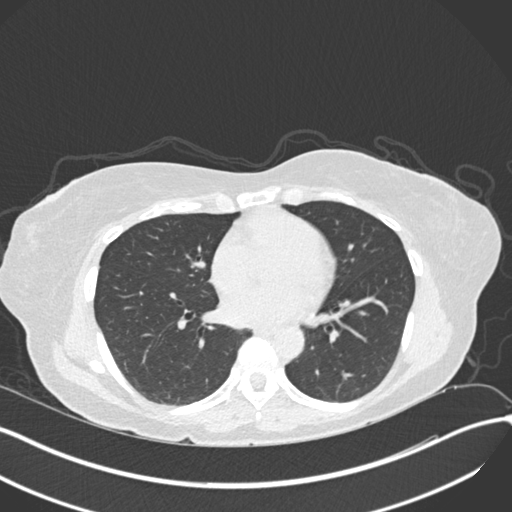
[im 93/162  lung]
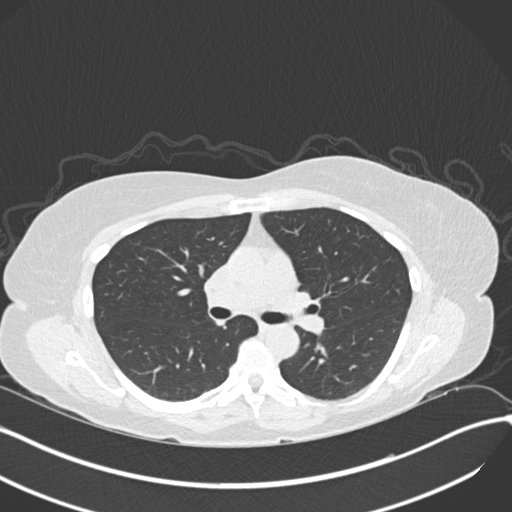
[im 104/162  lung]
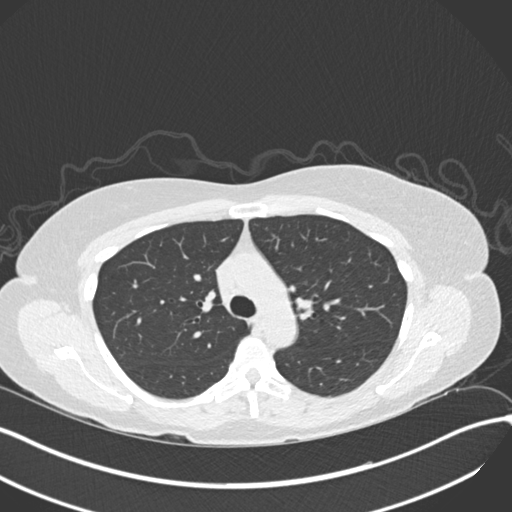
[im 116/162  mediastinal]
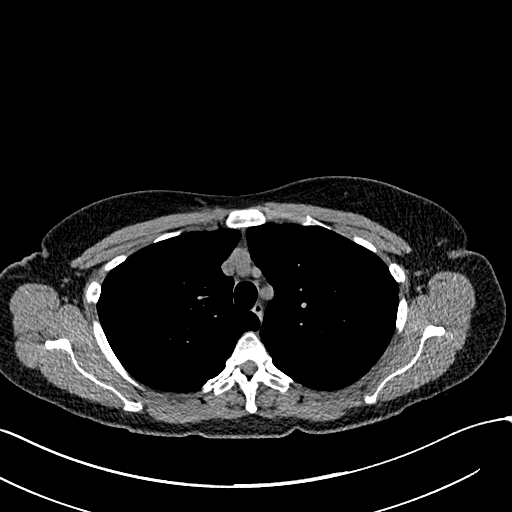
[im 116/162  lung]
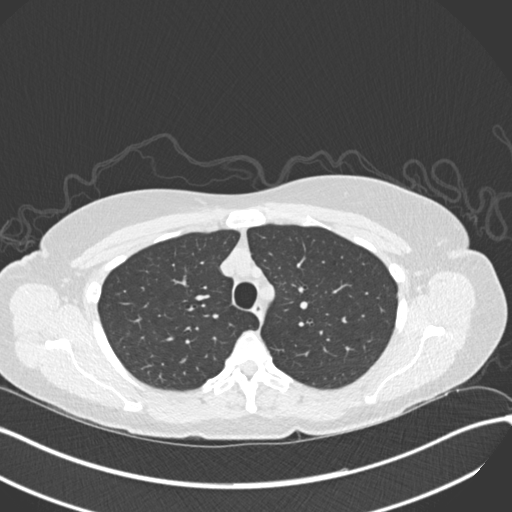
[im 127/162  lung]
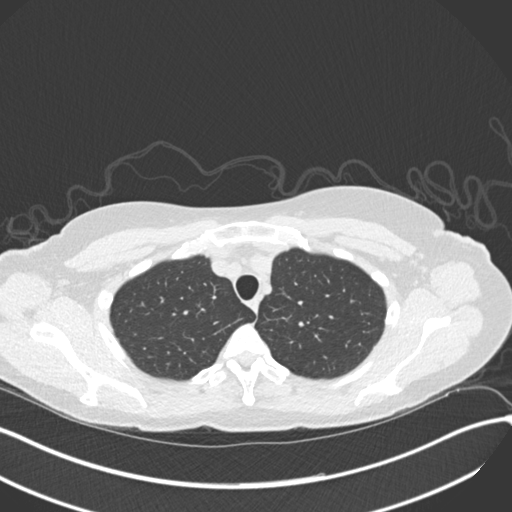
[im 139/162  lung]
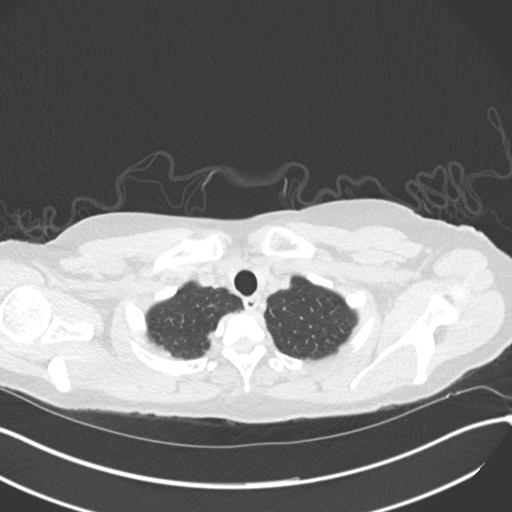
[im 150/162  lung]
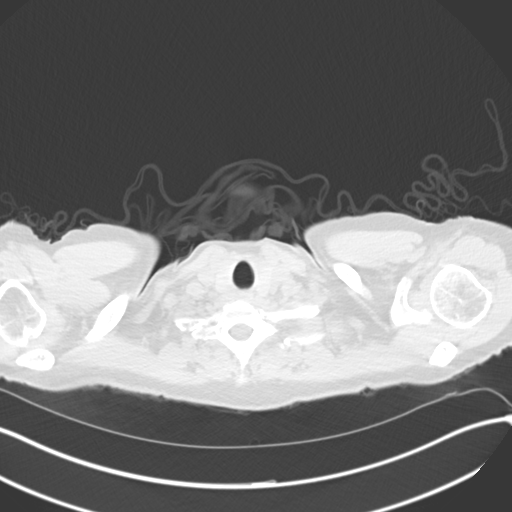

[Series 6: coronal · coronal · 0.63mm/px · 3 of 101 slices shown]
[im 21/101  lung]
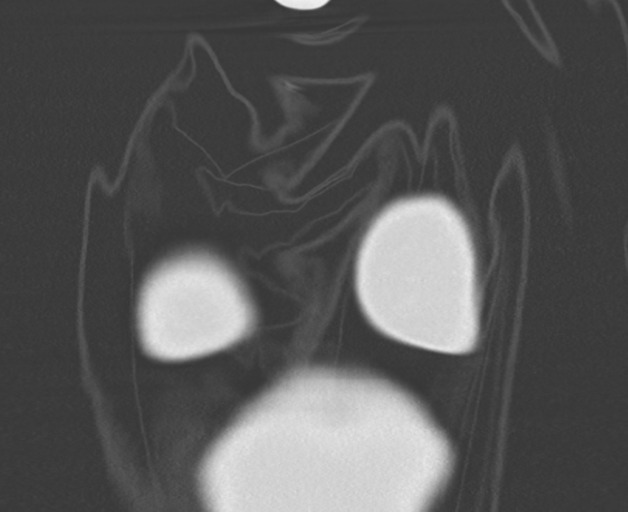
[im 41/101  lung]
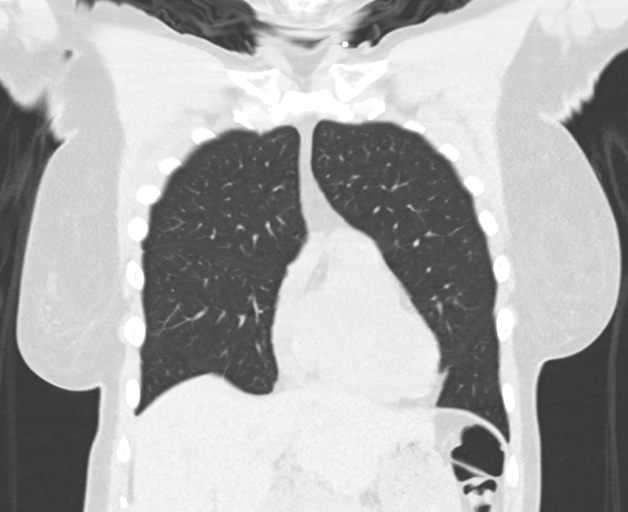
[im 61/101  lung]
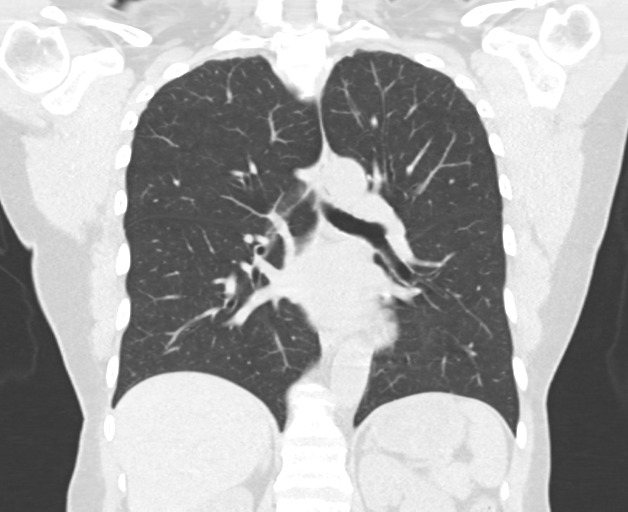

[15 of 36 positions shown; findings below may reference images not displayed]

FINDINGS: Cardiovascular: Thoracic aorta is within normal limits. No
cardiomegaly is noted. No pericardial effusion is seen. No
significant coronary calcifications are noted.

Mediastinum/Nodes: Thoracic inlet is within normal limits. No
significant hilar or mediastinal adenopathy is noted.

Lungs/Pleura: The lungs are well aerated bilaterally. No focal
infiltrate or sizable effusion is seen.

Upper Abdomen: Within normal limits.

Musculoskeletal: No acute bony abnormality is noted. Some
degenerative changes of the sternoclavicular joints are seen. This
is slightly more prominent on the left than the right. The area of
clinical concern, no focal mass lesion is noted.
IMPRESSION: Mild degenerative changes of the sternoclavicular joints slightly
greater on the left than the right. No focal abnormality to
correspond with the clinical history is noted.

## 2019-09-18 DIAGNOSIS — Z20822 Contact with and (suspected) exposure to covid-19: Secondary | ICD-10-CM | POA: Diagnosis not present

## 2019-09-18 DIAGNOSIS — R05 Cough: Secondary | ICD-10-CM | POA: Diagnosis not present

## 2019-09-20 DIAGNOSIS — Z7189 Other specified counseling: Secondary | ICD-10-CM | POA: Insufficient documentation

## 2019-09-20 NOTE — Progress Notes (Deleted)
Cardiology Office Note   Date:  09/20/2019   ID:  Nicole [name], DOB [birth year], MRN [id number]  PCP:  [name], MD  Cardiologist:   No primary care provider on file. Referring:  ***  No chief complaint on file.     History of Present Illness: Nicole [name] is a 59 y.o. female who presents for evaluation of chest pain.  I saw her last in 2018 for evaluation of chest pain.  She had a negative POET (Plain Old Exercise Treadmill).    She had an echocardiogram 2009 which was essentially normal.    Since I last saw her ***  ***However, since I last saw her she has had no cardiac complaints.  The patient denies any new symptoms such as chest discomfort, neck or arm discomfort. There has been no new shortness of breath, PND or orthopnea. There have been no reported palpitations, presyncope or syncope.  She is homeless but might be getting her own place soon.  She has exercised and does a great deal of walking and has no complaints with this.    Past Medical History:  Diagnosis Date  . Anemia   . Depression    bipolar  . GERD (gastroesophageal reflux disease)   . History of migraine headaches    headaches reported as migraines  . Hypotension   . Obesity   . Palpitations    negative cardiac w/u per Dr. Verl Blalock 2/09  . Perimenopause   . Reflux esophagitis   . Sleep apnea    No CPAP    Past Surgical History:  Procedure Laterality Date  . COLONOSCOPY    . ECTOPIC PREGNANCY SURGERY    . EXPLORATORY LAPAROTOMY  2000   x2 (Dr. Kendall Flack)  . HERPES SIMPLEX VIRUS DFA    . TUBAL LIGATION  1987     Current Outpatient Medications  Medication Sig Dispense Refill  . aspirin-acetaminophen-caffeine (EXCEDRIN MIGRAINE) 250-250-65 MG tablet Take 1 tablet by mouth every 6 (six) hours as needed for headache.    . Multiple Vitamin (MULTIVITAMIN) tablet Take 1 tablet by mouth at bedtime.     No current facility-administered medications for this visit.    Allergies:   Other     ROS:  Please see the history of present illness.   Otherwise, review of systems are positive for {NONE DEFAULTED:18576::"none"}.   All other systems are reviewed and negative.    PHYSICAL EXAM: VS:  LMP 03/03/2016 (Approximate)  , BMI There is no height or weight on file to calculate BMI. GENERAL:  Well appearing NECK:  No jugular venous distention, waveform within normal limits, carotid upstroke brisk and symmetric, no bruits, no thyromegaly LUNGS:  Clear to auscultation bilaterally CHEST:  Unremarkable HEART:  PMI not displaced or sustained,S1 and S2 within normal limits, no S3, no S4, no clicks, no rubs, *** murmurs ABD:  Flat, positive bowel sounds normal in frequency in pitch, no bruits, no rebound, no guarding, no midline pulsatile mass, no hepatomegaly, no splenomegaly EXT:  2 plus pulses throughout, no edema, no cyanosis no clubbing    ***GENERAL:  Well appearing HEENT:  Pupils equal round and reactive, fundi not visualized, oral mucosa unremarkable NECK:  No jugular venous distention, waveform within normal limits, carotid upstroke brisk and symmetric, no bruits, no thyromegaly LYMPHATICS:  No cervical, inguinal adenopathy LUNGS:  Clear to auscultation bilaterally BACK:  No CVA tenderness CHEST:  Unremarkable HEART:  PMI not displaced or sustained,S1 and S2 within  normal limits, no S3, no S4, no clicks, no rubs, *** murmurs ABD:  Flat, positive bowel sounds normal in frequency in pitch, no bruits, no rebound, no guarding, no midline pulsatile mass, no hepatomegaly, no splenomegaly EXT:  2 plus pulses throughout, no edema, no cyanosis no clubbing SKIN:  No rashes no nodules NEURO:  Cranial nerves II through XII grossly intact, motor grossly intact throughout PSYCH:  Cognitively intact, oriented to person place and time    EKG:  EKG {ACTION; IS/IS GI:[id number] ordered today. The ekg ordered today demonstrates ***   Recent Labs: 04/25/2019: ALT 12; BUN 15; Creatinine,  Ser 0.82; Hemoglobin 12.3; Platelets 205; Potassium 4.6; Sodium 141    Lipid Panel    Component Value Date/Time   CHOL 203 (H) 12/27/2015 1124   TRIG 99 12/27/2015 1124   HDL 58 12/27/2015 1124   CHOLHDL 3.5 12/27/2015 1124   CHOLHDL 3.1 Ratio 01/04/2010 2112   VLDL 13 01/04/2010 2112   LDLCALC 125 (H) 12/27/2015 1124      Wt Readings from Last 3 Encounters:  07/01/19 162 lb 4.8 oz (73.6 kg)  05/21/19 157 lb 3.2 oz (71.3 kg)  01/13/19 163 lb 14.4 oz (74.3 kg)      Other studies Reviewed: Additional studies/ records that were reviewed today include: ***. Review of the above records demonstrates:  Please see elsewhere in the note.  ***   ASSESSMENT AND PLAN:  CHEST PAIN:  ***  She no longer has this complaint.  No change in therapy is indicated.  No further testing is needed.    OVERWEIGHT:   ***  She has lost 30 to 40 lbs with diet and exercise.  I congratulated her and she will continue with more of the same.   TOBACCO:    ***  She says that she stopped smoking 2 days ago!  COVID EDUCATION:  ***   Current medicines are reviewed at length with the patient today.  The patient {ACTIONS; HAS/DOES NOT HAVE:19233} concerns regarding medicines.  The following changes have been made:  {PLAN; NO CHANGE:13088:s}  Labs/ tests ordered today include: *** No orders of the defined types were placed in this encounter.    Disposition:   FU with ***    Signed, Minus Breeding, MD  09/20/2019 2:03 PM    [place]

## 2019-09-21 ENCOUNTER — Ambulatory Visit: Payer: Medicare HMO | Admitting: Cardiology

## 2019-09-23 DIAGNOSIS — E669 Obesity, unspecified: Secondary | ICD-10-CM | POA: Insufficient documentation

## 2019-09-23 DIAGNOSIS — E663 Overweight: Secondary | ICD-10-CM | POA: Insufficient documentation

## 2019-09-23 NOTE — Progress Notes (Signed)
Cardiology Office Note   Date:  09/25/2019   ID:  Nicole [name], DOB 01-05-1961, MRN [id number]  PCP:  [name], MD  Cardiologist:   Minus Breeding, MD Referring:  [name], MD  Chief Complaint  Patient presents with  . Palpitations      History of Present Illness: Nicole [name] is a 59 y.o. female who presents for evaluation of chest pain.  I saw her last in 2018 for evaluation of chest pain.  She had a negative POET (Plain Old Exercise Treadmill).    She had an echocardiogram 2009 which was essentially normal.    Since I last saw her she has been having palpitations.  This has been happening for a couple of months.  It happens after she smokes marijuana and she smokes marijuana every day.  She also notices it with chocolate.  She does not notice it with other caffeine.  She feels her heart racing or skipping.  She feels some needlelike discomfort.  She might Valsalva and thinks this helps.  She is not had any frank syncope or presyncope.  She otherwise has no midsternal chest discomfort.  She has no neck or arm discomfort.  She has no shortness of breath, PND or orthopnea.  She walks 2 to 5 miles every day and does very well with this.   Past Medical History:  Diagnosis Date  . Anemia   . Depression    bipolar  . GERD (gastroesophageal reflux disease)   . History of migraine headaches    headaches reported as migraines  . Hypotension   . Obesity   . Palpitations    negative cardiac w/u per Dr. Verl Blalock 2/09  . Reflux esophagitis   . Sleep apnea    No CPAP    Past Surgical History:  Procedure Laterality Date  . COLONOSCOPY    . ECTOPIC PREGNANCY SURGERY    . EXPLORATORY LAPAROTOMY  2000   x2 (Dr. Kendall Flack)  . HERPES SIMPLEX VIRUS DFA    . TUBAL LIGATION  1987    No current outpatient medications on file.   No current facility-administered medications for this visit.    Allergies:   Other    ROS:  Please see the history of present illness.    Otherwise, review of systems are positive for none.   All other systems are reviewed and negative.    PHYSICAL EXAM: VS:  BP 118/78   Pulse 73   Ht 5\' 5"  (1.651 m)   Wt 162 lb (73.5 kg)   LMP 03/03/2016 (Approximate)   SpO2 95%   BMI 26.96 kg/m  , BMI Body mass index is 26.96 kg/m. GENERAL:  Well appearing NECK:  No jugular venous distention, waveform within normal limits, carotid upstroke brisk and symmetric, no bruits, no thyromegaly LUNGS:  Clear to auscultation bilaterally CHEST:  Unremarkable HEART:  PMI not displaced or sustained,S1 and S2 within normal limits, no S3, no S4, no clicks, no rubs, no murmurs ABD:  Flat, positive bowel sounds normal in frequency in pitch, no bruits, no rebound, no guarding, no midline pulsatile mass, no hepatomegaly, no splenomegaly EXT:  2 plus pulses throughout, no edema, no cyanosis no clubbing   EKG:  EKG is ordered today. The ekg ordered today demonstrates sinus rhythm, rate 70.,  Axis within normal limits, intervals within normal limits, no acute ST-T wave changes.   Recent Labs: 04/25/2019: ALT 12; BUN 15; Creatinine, Ser 0.82; Hemoglobin 12.3; Platelets  205; Potassium 4.6; Sodium 141    Lipid Panel    Component Value Date/Time   CHOL 203 (H) 12/27/2015 1124   TRIG 99 12/27/2015 1124   HDL 58 12/27/2015 1124   CHOLHDL 3.5 12/27/2015 1124   CHOLHDL 3.1 Ratio 01/04/2010 2112   VLDL 13 01/04/2010 2112   LDLCALC 125 (H) 12/27/2015 1124      Wt Readings from Last 3 Encounters:  09/25/19 162 lb (73.5 kg)  07/01/19 162 lb 4.8 oz (73.6 kg)  05/21/19 157 lb 3.2 oz (71.3 kg)      Other studies Reviewed: Additional studies/ records that were reviewed today include: Previous echo and nuclear and 2009 cardiology report. Review of the above records demonstrates:  Please see elsewhere in the note.     ASSESSMENT AND PLAN:  PALPITATIONS:  The patient has new onset tachypalpitations of bother her.  She certainly seems to understand what  stimulates it and we talked about quitting this which she might consider.  At this point she needs a 2-week event monitor.  I will also check a TSH, basic metabolic profile and magnesium.  MARIJUANA USE:     She habitually uses this.  We talked about this at his being associated with palpitations as it clearly seems to be.  She will consider cutting back on this.    COVID EDUCATION:   We talked about the vaccine.  She would consent to get the [place] vaccine if available.  Current medicines are reviewed at length with the patient today.  The patient does not have concerns regarding medicines.  The following changes have been made:  no change  Labs/ tests ordered today include:   Orders Placed This Encounter  Procedures  . TSH  . Basic metabolic panel  . Magnesium  . LONG TERM MONITOR (3-14 DAYS)  . EKG 12-Lead     Disposition:   FU with me as needed and as based on the results of the monitor.   Signed, Minus Breeding, MD  09/25/2019 9:22 AM    [facility]

## 2019-09-25 ENCOUNTER — Telehealth: Payer: Self-pay | Admitting: Radiology

## 2019-09-25 ENCOUNTER — Other Ambulatory Visit: Payer: Self-pay

## 2019-09-25 ENCOUNTER — Ambulatory Visit (INDEPENDENT_AMBULATORY_CARE_PROVIDER_SITE_OTHER): Payer: Medicare HMO | Admitting: Cardiology

## 2019-09-25 ENCOUNTER — Encounter: Payer: Self-pay | Admitting: Cardiology

## 2019-09-25 VITALS — BP 118/78 | HR 73 | Ht 65.0 in | Wt 162.0 lb

## 2019-09-25 DIAGNOSIS — Z720 Tobacco use: Secondary | ICD-10-CM | POA: Diagnosis not present

## 2019-09-25 DIAGNOSIS — Z7189 Other specified counseling: Secondary | ICD-10-CM | POA: Diagnosis not present

## 2019-09-25 DIAGNOSIS — R079 Chest pain, unspecified: Secondary | ICD-10-CM | POA: Diagnosis not present

## 2019-09-25 DIAGNOSIS — R002 Palpitations: Secondary | ICD-10-CM

## 2019-09-25 DIAGNOSIS — E663 Overweight: Secondary | ICD-10-CM

## 2019-09-25 LAB — BASIC METABOLIC PANEL
BUN/Creatinine Ratio: 14 (ref 12–28)
BUN: 12 mg/dL (ref 8–27)
CO2: 24 mmol/L (ref 20–29)
Calcium: 9.6 mg/dL (ref 8.7–10.3)
Chloride: 102 mmol/L (ref 96–106)
Creatinine, Ser: 0.86 mg/dL (ref 0.57–1.00)
GFR calc Af Amer: 85 mL/min/{1.73_m2} (ref >59)
GFR calc non Af Amer: 74 mL/min/{1.73_m2} (ref >59)
Glucose: 89 mg/dL (ref 65–99)
Potassium: 4.9 mmol/L (ref 3.5–5.2)
Sodium: 141 mmol/L (ref 134–144)

## 2019-09-25 LAB — TSH: TSH: 0.873 u[IU]/mL (ref 0.450–4.500)

## 2019-09-25 LAB — MAGNESIUM: Magnesium: 2.1 mg/dL (ref 1.6–2.3)

## 2019-09-25 NOTE — Patient Instructions (Signed)
Medication Instructions:  No changes *If you need a refill on your cardiac medications before your next appointment, please call your pharmacy*  Lab Work: Your physician recommends that you return for lab work today (TSH, BMP, Mag) If you have labs (blood work) drawn today and your tests are completely normal, you will receive your results only by: Marland Kitchen MyChart Message (if you have MyChart) OR . A paper copy in the mail If you have any lab test that is abnormal or we need to change your treatment, we will call you to review the results.  Testing/Procedures: Your physician has recommended that you wear an event monitor. Event monitors are medical devices that record the heart's electrical activity. Doctors most often [place] these monitors to diagnose arrhythmias. Arrhythmias are problems with the speed or rhythm of the heartbeat. The monitor is a small, portable device. You can wear one while you do your normal daily activities. This is usually used to diagnose what is causing palpitations/syncope (passing out).   Follow-Up: At [facility], you and your health needs are our priority.  As part of our continuing mission to provide you with exceptional heart care, we have created designated Provider Care Teams.  These Care Teams include your primary Cardiologist (physician) and Advanced Practice Providers (APPs -  Physician Assistants and Nurse Practitioners) who all work together to provide you with the care you need, when you need it.  Your next appointment:   Follow up as needed  Other Instructions ZIO XT- Long Term Monitor Instructions   Your physician has requested you wear your ZIO patch monitor_14_days.   This is a single patch monitor.  [employer] supplies one patch monitor per enrollment.  Additional stickers are not available.   Please do not apply patch if you will be having a Nuclear Stress Test, Echocardiogram, Cardiac CT, MRI, or Chest Xray during the time frame you would be wearing  the monitor. The patch cannot be worn during these tests.  You cannot remove and re-apply the ZIO XT patch monitor.   Your ZIO patch monitor will be sent USPS Priority mail from [facility] directly to your home address. The monitor may also be mailed to a PO BOX if home delivery is not available.   It may take 3-5 days to receive your monitor after you have been enrolled.   Once you have received you monitor, please review enclosed instructions.  Your monitor has already been registered assigning a specific monitor serial # to you.   Applying the monitor   Shave hair from upper left chest.   Hold abrader disc by orange tab.  Rub abrader in 40 strokes over left upper chest as indicated in your monitor instructions.   Clean area with 4 enclosed alcohol pads .  Use all pads to assure are is cleaned thoroughly.  Let dry.   Apply patch as indicated in monitor instructions.  Patch will be place under collarbone on left side of chest with arrow pointing upward.   Rub patch adhesive wings for 2 minutes.Remove white label marked "1".  Remove white label marked "2".  Rub patch adhesive wings for 2 additional minutes.   While looking in a mirror, press and release button in center of patch.  A small green light will flash 3-4 times .  This will be your only indicator the monitor has been turned on.     Do not shower for the first 24 hours.  You may shower after the first 24 hours.  Press button if you feel a symptom. You will hear a small click.  Record Date, Time and Symptom in the Patient Log Book.   When you are ready to remove patch, follow instructions on last 2 pages of Patient Log Book.  Stick patch monitor onto last page of Patient Log Book.   Place Patient Log Book in [place] box.  Use locking tab on box and tape box closed securely.  The [employer] and [employer] has [employer] on it.  Please place in mailbox as soon as possible.  Your physician should have your test results  approximately 7 days after the monitor has been mailed back to [facility].   Call [place] at [phone] if you have questions regarding your ZIO XT patch monitor.  Call them immediately if you see an orange light blinking on your monitor.   If your monitor falls off in less than 4 days contact our Monitor department at [phone].  If your monitor becomes loose or falls off after 4 days call [employer] at [phone] for suggestions on securing your monitor.

## 2019-09-25 NOTE — Telephone Encounter (Signed)
Enrolled patient for a 14 day Zio monitor to be mailed to patients home.  

## 2019-09-28 ENCOUNTER — Encounter: Payer: Self-pay | Admitting: *Deleted

## 2019-09-29 ENCOUNTER — Ambulatory Visit (INDEPENDENT_AMBULATORY_CARE_PROVIDER_SITE_OTHER): Payer: Medicare HMO | Admitting: Internal Medicine

## 2019-09-29 DIAGNOSIS — F419 Anxiety disorder, unspecified: Secondary | ICD-10-CM | POA: Diagnosis not present

## 2019-09-29 DIAGNOSIS — N63 Unspecified lump in unspecified breast: Secondary | ICD-10-CM | POA: Diagnosis not present

## 2019-09-29 DIAGNOSIS — K219 Gastro-esophageal reflux disease without esophagitis: Secondary | ICD-10-CM

## 2019-09-29 DIAGNOSIS — N3000 Acute cystitis without hematuria: Secondary | ICD-10-CM | POA: Diagnosis not present

## 2019-09-29 DIAGNOSIS — F3160 Bipolar disorder, current episode mixed, unspecified: Secondary | ICD-10-CM

## 2019-09-29 DIAGNOSIS — Z1231 Encounter for screening mammogram for malignant neoplasm of breast: Secondary | ICD-10-CM

## 2019-09-29 DIAGNOSIS — F319 Bipolar disorder, unspecified: Secondary | ICD-10-CM | POA: Diagnosis not present

## 2019-09-29 NOTE — Progress Notes (Signed)
   CC: Bipolar disorder  HPI:  [name] is a 59 y.o. F w/ PMH of Bipolar, Anxiety and GERD presenting to management of her chronic conditions. She mentions that she has been doing well and has no acute complaints at this time. She mentions she needs [place] form [place] to continue her residence with [facility], which is her primary reason for her visit today. She also mentions that she has had a cardiology visit for palpitations which is in the process of being worked up with event monitor. She denies any chest pain, light-headedness or dyspnea. She denies any depression, suicidal/homicideal ideations, auditory/tactile/visual hallucinations.  Past Medical History:  Diagnosis Date  . Anemia   . Depression    bipolar  . GERD (gastroesophageal reflux disease)   . History of migraine headaches    headaches reported as migraines  . Hypotension   . Obesity   . Palpitations    negative cardiac w/u per Dr. Verl Blalock 2/09  . Reflux esophagitis   . Sleep apnea    No CPAP   Review of Systems:   Per HPI  Physical Exam:  Vitals:   09/29/19 1421  BP: 111/68  Pulse: (!) 106  Temp: 98.1 F (36.7 C)  TempSrc: Oral  SpO2: 99%  Weight: 167 lb 6.4 oz (75.9 kg)  Height: 5\' 5"  (1.651 m)   Gen: Well-developed, well nourished, NAD HEENT: NCAT head, hearing intact, MMM Neck: supple, ROM intact, no JVD CV: RRR, S1, S2 normal, No rubs, no murmurs, no gallops Pulm: CTAB, No rales, no wheezes Abd: Soft, BS+, NTND, No rebound, no guarding Extm: ROM intact, Peripheral pulses intact, No peripheral edema Skin: Dry, Warm, normal turgor, no rashes, lesions, wounds.  Neuro: AAOx3   Assessment & Plan:   See Encounters Tab for problem based charting.  Patient discussed with Dr. Heber [place]

## 2019-09-30 ENCOUNTER — Encounter: Payer: Self-pay | Admitting: Internal Medicine

## 2019-09-30 NOTE — Assessment & Plan Note (Signed)
Discussed need for repeat mammogram as she is over-due. Last mammogram in 2018 with no alarming findings. Nicole [name] expressed understanding but states she would like to wait until later this year.

## 2019-09-30 NOTE — Assessment & Plan Note (Addendum)
Mrs.Lorge presents for continued management of her chronic Bipolar disorder. Per chart review, previously been seen by [facility] providers. She had refused pharmacological therapy in the past and had previously been managed with counseling alone. On exam, Ms.Heaphy presents with pressured, tangential speech but does not have significant evidence of grandeur. Denies any depressive symptoms, suicidal/homicidal ideations. She mentions that she would like to be tested for pregnancy because she feels like she may be pregnant as she has previous sexual encounter 2 days ago, but states her last menstrual period was 20 years ago and she denies any other signs/symptoms of pregnancy. She mentions that she is currently not attending her counseling sessions as she is afraid of getting COVID. Discussed again option of starting anti-psychotic therapy but patient again refused.   - Reassured her that pregnancy test is not indicated - F/u [facility] once COVID pre-cautions resolve

## 2019-10-01 NOTE — Progress Notes (Signed)
Internal Medicine Clinic Attending ° °Case discussed with Dr. Lee at the time of the visit.  We reviewed the resident’s history and exam and pertinent patient test results.  I agree with the assessment, diagnosis, and plan of care documented in the resident’s note.  °

## 2019-10-19 ENCOUNTER — Telehealth: Payer: Self-pay | Admitting: *Deleted

## 2019-10-19 NOTE — Telephone Encounter (Signed)
RETURNED CALL TO PATIENT REQUESTING [facility] REFERRAL. SHE CALL AND OFFICE SAYS SHE NEEDS A REFERRAL, WILL SEND MESSAGE TO DR LEE. Marland Kitchen

## 2019-10-20 ENCOUNTER — Other Ambulatory Visit: Payer: Self-pay | Admitting: Internal Medicine

## 2019-10-20 ENCOUNTER — Telehealth: Payer: Self-pay | Admitting: Internal Medicine

## 2019-10-20 NOTE — Telephone Encounter (Signed)
Attempted to call Ms.Elms regarding her request for referral at mobile number. Busy signal. Will attempt again later

## 2019-10-22 ENCOUNTER — Other Ambulatory Visit (HOSPITAL_COMMUNITY)
Admission: RE | Admit: 2019-10-22 | Discharge: 2019-10-22 | Disposition: A | Discharge status: Home / Self Care | Payer: Medicare HMO | Source: Ambulatory Visit | Attending: Obstetrics and Gynecology | Admitting: Obstetrics and Gynecology

## 2019-10-22 ENCOUNTER — Other Ambulatory Visit: Payer: Self-pay

## 2019-10-22 ENCOUNTER — Ambulatory Visit (INDEPENDENT_AMBULATORY_CARE_PROVIDER_SITE_OTHER): Payer: Medicare HMO | Admitting: *Deleted

## 2019-10-22 DIAGNOSIS — N898 Other specified noninflammatory disorders of vagina: Secondary | ICD-10-CM | POA: Insufficient documentation

## 2019-10-22 NOTE — Progress Notes (Signed)
Here for self swab . C/o clear/ yellowish vaginal discharge .c/o vaginal odor and discomfort/pain. C/o started after oral sex. Will do self swab for GC, BV, yeast and treat if +. I explained if negative and she still has vaginal pain or unusual discharge will need to make appointment to see provider. She voices understanding. Loye Vento,RN

## 2019-10-22 NOTE — Progress Notes (Signed)
Patient seen and assessed by nursing staff during this encounter. I have reviewed the chart and agree with the documentation and plan.  Aletha Halim, MD 10/22/2019 2:18 PM

## 2019-10-23 DIAGNOSIS — N3020 Other chronic cystitis without hematuria: Secondary | ICD-10-CM | POA: Diagnosis not present

## 2019-10-23 DIAGNOSIS — N342 Other urethritis: Secondary | ICD-10-CM | POA: Diagnosis not present

## 2019-10-23 DIAGNOSIS — N760 Acute vaginitis: Secondary | ICD-10-CM | POA: Diagnosis not present

## 2019-10-23 LAB — CERVICOVAGINAL ANCILLARY ONLY
Bacterial Vaginitis (gardnerella): POSITIVE — AB
Candida Glabrata: NEGATIVE
Candida Vaginitis: NEGATIVE
Chlamydia: NEGATIVE
Comment: NEGATIVE
Comment: NEGATIVE
Comment: NEGATIVE
Comment: NEGATIVE
Comment: NEGATIVE
Comment: NORMAL
Neisseria Gonorrhea: NEGATIVE
Trichomonas: NEGATIVE

## 2019-10-26 ENCOUNTER — Telehealth: Payer: Self-pay | Admitting: Family Medicine

## 2019-10-26 MED ORDER — METRONIDAZOLE 500 MG PO TABS: 500.0000 mg | ORAL_TABLET | Freq: Two times a day (BID) | ORAL | 0 refills | Status: DC

## 2019-10-26 NOTE — Telephone Encounter (Signed)
Called pt and informed her of test results showing BV. Pt has had this problem in the past and had no questions. Rx has been sent to her pharmacy. Pt voiced understanding.

## 2019-10-26 NOTE — Telephone Encounter (Signed)
Patient is requesting her results.

## 2019-10-26 NOTE — Addendum Note (Signed)
Addended by: Aletha Halim on: 10/26/2019 08:13 AM   Modules accepted: Orders

## 2019-10-29 ENCOUNTER — Other Ambulatory Visit: Payer: Self-pay | Admitting: Internal Medicine

## 2019-10-29 ENCOUNTER — Telehealth: Payer: Self-pay | Admitting: Internal Medicine

## 2019-10-29 DIAGNOSIS — G43709 Chronic migraine without aura, not intractable, without status migrainosus: Secondary | ICD-10-CM

## 2019-10-29 DIAGNOSIS — F22 Delusional disorders: Secondary | ICD-10-CM

## 2019-10-29 NOTE — Progress Notes (Signed)
   Reason for call:   I received a call from [name] at 5pm PM indicating she needs a referral back to her neurologist.   Pertinent Data:   She endorses a history of chronic head aches and migraines. She has a lot of stress which has been increased recently and so headaches have been occurring more often. She previously saw [facility] but stopped seeing them in 2015. With her headaches increased recently she was hoping to go back to [facility] and discussed with with her PCP. Unfortunately, they require a referral to go back and she has been unable to get in touch with PCP to obtain referral. Her phone recently broke and this might be why. She states this is a chronic problem for many years.  She is not currently having a headache but sometimes they can be very painful. She denies changes in vision, current weakness or dizziness.    Assessment / Plan / Recommendations:   Notes reviewed and appears PCP tried to contact patient but her phone number was not working. Will forward note to PCP and [facility] for review.   Patient's New Phone Number: [phone].    As always, pt is advised that if symptoms worsen or new symptoms arise, they should go to an urgent care facility or to to ER for further evaluation.   Nicole [name] A, DO   10/29/2019, 5:29 PM

## 2019-10-30 NOTE — Progress Notes (Signed)
Please advise if a new referral can be placed or if she needs an Appt with [facility] first.

## 2019-10-31 NOTE — Progress Notes (Signed)
I can make the referral without an [facility] appointment. Thank you.

## 2019-11-02 NOTE — Progress Notes (Signed)
Will forward Referral to [place] once Dr. Truman Hayward places it in [place].

## 2019-11-02 NOTE — Addendum Note (Signed)
Addended by: [name] on: 11/02/2019 09:44 PM   Modules accepted: Orders

## 2019-11-05 ENCOUNTER — Other Ambulatory Visit: Payer: Self-pay | Admitting: Internal Medicine

## 2019-11-05 ENCOUNTER — Other Ambulatory Visit: Payer: Self-pay | Admitting: Family Medicine

## 2019-11-05 DIAGNOSIS — Z1231 Encounter for screening mammogram for malignant neoplasm of breast: Secondary | ICD-10-CM

## 2019-11-05 DIAGNOSIS — Z1239 Encounter for other screening for malignant neoplasm of breast: Secondary | ICD-10-CM

## 2019-11-05 DIAGNOSIS — N63 Unspecified lump in unspecified breast: Secondary | ICD-10-CM

## 2019-11-06 ENCOUNTER — Ambulatory Visit: Payer: Medicare HMO

## 2019-11-10 ENCOUNTER — Encounter: Payer: Self-pay | Admitting: Internal Medicine

## 2019-11-10 ENCOUNTER — Other Ambulatory Visit: Payer: Self-pay

## 2019-11-10 ENCOUNTER — Ambulatory Visit (INDEPENDENT_AMBULATORY_CARE_PROVIDER_SITE_OTHER): Payer: Medicare HMO | Admitting: Internal Medicine

## 2019-11-10 VITALS — BP 121/78 | HR 78 | Temp 98.1°F | Ht 65.0 in | Wt 167.4 lb

## 2019-11-10 DIAGNOSIS — E785 Hyperlipidemia, unspecified: Secondary | ICD-10-CM | POA: Insufficient documentation

## 2019-11-10 DIAGNOSIS — Z8639 Personal history of other endocrine, nutritional and metabolic disease: Secondary | ICD-10-CM

## 2019-11-10 DIAGNOSIS — F319 Bipolar disorder, unspecified: Secondary | ICD-10-CM | POA: Diagnosis not present

## 2019-11-10 DIAGNOSIS — F3160 Bipolar disorder, current episode mixed, unspecified: Secondary | ICD-10-CM

## 2019-11-10 NOTE — Assessment & Plan Note (Addendum)
Per chart review patient has a history of bipolar disorder and not interested in pharmocological treatment. She currently use Marijuana for anxiety and it sometimes causes irritation of her eyes. Also reports walking 4 miles daily. Appreciate pressured speech. Denies any depression, SI, or HI today. PHQ-9 , 6 today.

## 2019-11-10 NOTE — Progress Notes (Signed)
   CC: cholesterol check  HPI:[name] is a 59 y.o. female who presents for evaluation of her cholesterol. Please see individual problem based A/P for details.  Depression, PHQ-9: Based on the patients    Office Visit from 11/10/2019 in [place]  PHQ-9 Total Score  6      Past Medical History:  Diagnosis Date  . Anemia   . Depression    bipolar  . GERD (gastroesophageal reflux disease)   . History of migraine headaches    headaches reported as migraines  . Hypotension   . Obesity   . Palpitations    negative cardiac w/u per Dr. Verl Blalock 2/09  . Reflux esophagitis   . Sleep apnea    No CPAP   Review of Systems:  ROS negative except as per HPI.  Physical Exam: Vitals:   11/10/19 0855  BP: 121/78  Pulse: 78  Temp: 98.1 F (36.7 C)  TempSrc: Oral  SpO2: 100%  Weight: 167 lb 6.4 oz (75.9 kg)  Height: 5\' 5"  (1.651 m)    General: Alert, nl appearance HE: Normocephalic, atraumatic , EOMI, Conjunctivae normal ENT: No congestion, no rhinorrhea moist, no exudate or erythema  Cardiovascular: Normal rate, regular rhythm.  No murmurs, rubs, or gallops Pulmonary : Effort normal, breath sounds normal. No wheezes, rales, or rhonchi Abdominal: soft, nontender,  bowel sounds present Musculoskeletal: no swelling , deformity, injury ,or tenderness in extremities, Skin: Warm, dry , no bruising, erythema, or rash Psychiatric/Behavioral:  normal mood, pressured speech , normal behavior    Assessment & Plan:   See Encounters Tab for problem based charting.  Patient discussed with Dr. Lynnae January

## 2019-11-10 NOTE — Assessment & Plan Note (Addendum)
History of hyperlipidemia: LDL 125 in 2017.  Patient says she is here to have her lipid panel checked. Patient would not be willing to start a statin if recommended, but will make lifestyle changes if her lipid panel is abnormal.  No history of stroke or heart attack.  Patient is normotensive on exam.  Plan: Lipid panel   Addendum: The 10-year ASCVD risk score Mikey Bussing DC Jr., et al., 2013) is: 7.3%   Values used to calculate the score:     Age: 59 years     Sex: Female     Is Non-Hispanic African American: Yes     Diabetic: No     Tobacco smoker: Yes     Systolic Blood Pressure: 123XX123 mmHg     Is BP treated: No     HDL Cholesterol: 73 mg/dL     Total Cholesterol: 200 mg/dL   Moderate intensity statin recommended. Patient declined.

## 2019-11-10 NOTE — Patient Instructions (Signed)
Thank you for trusting me with your care. To recap, today we discussed the following:   Cholesterol We will get a lipid panel today and will follow up with the results.

## 2019-11-11 LAB — LIPID PANEL
Chol/HDL Ratio: 2.7 ratio (ref 0.0–4.4)
Cholesterol, Total: 200 mg/dL — ABNORMAL HIGH (ref 100–199)
HDL: 73 mg/dL (ref >39)
LDL Chol Calc (NIH): 108 mg/dL — ABNORMAL HIGH (ref 0–99)
Triglycerides: 111 mg/dL (ref 0–149)
VLDL Cholesterol Cal: 19 mg/dL (ref 5–40)

## 2019-11-11 NOTE — Progress Notes (Signed)
Internal Medicine Clinic Attending ° °Case discussed with Dr. Steen  at the time of the visit.  We reviewed the resident’s history and exam and pertinent patient test results.  I agree with the assessment, diagnosis, and plan of care documented in the resident’s note.  °

## 2019-11-24 ENCOUNTER — Ambulatory Visit: Payer: Medicare HMO

## 2019-11-24 ENCOUNTER — Ambulatory Visit (INDEPENDENT_AMBULATORY_CARE_PROVIDER_SITE_OTHER): Payer: Medicare HMO | Admitting: Diagnostic Neuroimaging

## 2019-11-24 ENCOUNTER — Encounter: Payer: Self-pay | Admitting: Diagnostic Neuroimaging

## 2019-11-24 ENCOUNTER — Other Ambulatory Visit: Payer: Self-pay

## 2019-11-24 VITALS — BP 104/80 | HR 81 | Temp 96.9°F | Ht 65.5 in | Wt 168.0 lb

## 2019-11-24 DIAGNOSIS — G4452 New daily persistent headache (NDPH): Secondary | ICD-10-CM

## 2019-11-24 DIAGNOSIS — G43109 Migraine with aura, not intractable, without status migrainosus: Secondary | ICD-10-CM

## 2019-11-24 DIAGNOSIS — R42 Dizziness and giddiness: Secondary | ICD-10-CM | POA: Diagnosis not present

## 2019-11-24 NOTE — Patient Instructions (Signed)
  INCREASING HEADACHES (h/o migraine with aura) - check MRI brain due to increasing severity and change in quality (more dizziness, increased intensity, increased frequency)

## 2019-11-24 NOTE — Progress Notes (Signed)
[facility]  PATIENT: Nicole [name] DOB: 07/01/1961  REFERRING CLINICIAN: Bartholomew Crews, MD HISTORY FROM: patient  REASON FOR VISIT: new consult    HISTORICAL  CHIEF COMPLAINT:  Chief Complaint  Patient presents with  . Migraine    rm 7 New Pt  "averaging headaches 3 x week, take only ASA"    HISTORY OF PRESENT ILLNESS:   UPDATE (11/24/19, VRP): 59 year old female with headaches; more intense HA since past 1 month. More stress. Taking aspirin as needed for HA. HA are more intense, severe, with dizziness.    UPDATE 06/17/14 (VRP): Since last visit, doing well. No headaches. Not needing triptan. Stress is much improved, and this seems to be helping her headaches. Planning to get CPAP machine soon.   UPDATE 12/16/13 (LL): She returns for migraine follow up, headache are some improved, did not try triptan.  She is concerned that she has sleep apnea and that this is causing her headaches.  She has family reports that she has long apnea pauses when she sleeps, history of snoring, ESS is 20 and FSS is 46 today.    PRIOR HPI (09/14/13): 59 year old right-handed female here for evaluation of headaches. Patient does have headaches since age 43 years old, yearly headaches, with frontal, unilateral sometimes bilateral throbbing pressure sensation. Sometimes associated with stomach pain, photophobia and phonophobia. No nausea or vomiting. Over her life, in the last one to 2 years symptoms have changed. Now she has pinching and hair pulling sensation of the back of her head. These are more significant during periods of stress. From October to December 2014 patient was under higher stress with worse symptoms. In January 2015 symptoms have significantly improved. Patient has tried oxycodone/Tylenol in the past with mild relief. She's not been officially diagnosed with migraine in the past. She's not officially taken migraine medication in the past. No family history of migraine.      REVIEW OF SYSTEMS: Full 14 system review of systems performed and negative with exception of: as per HPI.   ALLERGIES: Allergies  Allergen Reactions  . Other Itching and Hives    Pain medication ordered at [facility] in 2009 - Patient does not know name of medication  Pain medication given at [facility] approximately 2009 Pain medication ordered at [facility] in 2009 - Patient does not know name of medication  Pain medication given at [facility] approximately 2009    HOME MEDICATIONS: Outpatient Medications Prior to Visit  Medication Sig Dispense Refill  . aspirin 325 MG tablet Take 325 mg by mouth daily.    . metroNIDAZOLE (FLAGYL) 500 MG tablet Take 1 tablet (500 mg total) by mouth 2 (two) times daily. (Patient not taking: Reported on 11/24/2019) 14 tablet 0   No facility-administered medications prior to visit.    PAST MEDICAL HISTORY: Past Medical History:  Diagnosis Date  . Anemia   . Depression    bipolar  . GERD (gastroesophageal reflux disease)   . History of migraine headaches    headaches reported as migraines  . Hypercholesterolemia   . Hypotension   . Obesity   . Palpitations    negative cardiac w/u per Dr. Verl Blalock 2/09  . Reflux esophagitis   . Sleep apnea    No CPAP    PAST SURGICAL HISTORY: Past Surgical History:  Procedure Laterality Date  . COLONOSCOPY    . ECTOPIC PREGNANCY SURGERY    . EXPLORATORY LAPAROTOMY  2000   x2 (  Dr. Kendall Flack)  . HERPES SIMPLEX VIRUS DFA    . TUBAL LIGATION  1987    FAMILY HISTORY: Family History  Problem Relation Age of Onset  . Hypertension Mother   . Diabetes Mother   . Heart disease Mother   . Thyroid disease Mother   . Heart attack Mother   . Glaucoma Mother   . Pancreatic cancer Other        family history  . Heart attack Maternal Grandmother   . Heart attack Maternal Grandfather   . Breast cancer Maternal Aunt   . Pancreatic cancer Maternal Uncle   . Diabetes Sister   .  Hypertension Sister   . Colon cancer Neg Hx     SOCIAL HISTORY: Social History   Socioeconomic History  . Marital status: Single    Spouse name: Not on file  . Number of children: 3  . Years of education: Not on file  . Highest education level: Not on file  Occupational History  . Occupation: Agricultural consultant: UNEMPLOYED    Comment: Unemployed, on disability  Tobacco Use  . Smoking status: Former Smoker    Packs/day: 0.25    Years: 36.00    Pack years: 9.00    Types: Cigarettes    Quit date: 09/19/2019    Years since quitting: 0.1  . Smokeless tobacco: Never Used  . Tobacco comment: 2 cigs and 4 marijuana daily. wants to quit both.  Substance and Sexual Activity  . Alcohol use: Yes    Alcohol/week: 0.0 standard drinks    Comment: Rare glass of wine  . Drug use: Yes    Types: Marijuana    Comment: 11/24/19 daily marijuana use   . Sexual activity: Never  Other Topics Concern  . Not on file  Social History Narrative   Patient lives at home alone.   Caffeine Use: none         Epworth Sleepiness Scale = 12 (as of 09/13/2015)   Social Determinants of Health   Financial Resource Strain:   . Difficulty of Paying Living Expenses:   Food Insecurity:   . Worried About Charity fundraiser in the Last Year:   . Arboriculturist in the Last Year:   Transportation Needs:   . Film/video editor (Medical):   Marland Kitchen Lack of Transportation (Non-Medical):   Physical Activity:   . Days of Exercise per Week:   . Minutes of Exercise per Session:   Stress:   . Feeling of Stress :   Social Connections:   . Frequency of Communication with Friends and Family:   . Frequency of Social Gatherings with Friends and Family:   . Attends Religious Services:   . Active Member of Clubs or Organizations:   . Attends Archivist Meetings:   Marland Kitchen Marital Status:   Intimate Partner Violence:   . Fear of Current or Ex-Partner:   . Emotionally Abused:   Marland Kitchen Physically  Abused:   . Sexually Abused:      PHYSICAL EXAM  GENERAL EXAM/CONSTITUTIONAL: Vitals:  Vitals:   11/24/19 0824  BP: 104/80  Pulse: 81  Temp: (!) 96.9 F (36.1 C)  Weight: 168 lb (76.2 kg)  Height: 5' 5.5" (1.664 m)     Body mass index is 27.53 kg/m. Wt Readings from Last 3 Encounters:  11/24/19 168 lb (76.2 kg)  11/10/19 167 lb 6.4 oz (75.9 kg)  09/29/19 167 lb 6.4 oz (75.9  kg)     Patient is in no distress; well developed, nourished and groomed; neck is supple  CARDIOVASCULAR:  Examination of carotid arteries is normal; no carotid bruits  Regular rate and rhythm, no murmurs  Examination of peripheral vascular system by observation and palpation is normal  EYES:  Ophthalmoscopic exam of optic discs and posterior segments is normal; no papilledema or hemorrhages  No exam data present  MUSCULOSKELETAL:  Gait, strength, tone, movements noted in Neurologic exam below  NEUROLOGIC: MENTAL STATUS:  No flowsheet data found.  awake, alert, oriented to person, place and time  recent and remote memory intact  normal attention and concentration  language fluent, comprehension intact, naming intact  fund of knowledge appropriate  CRANIAL NERVE:   2nd - no papilledema on fundoscopic exam  2nd, 3rd, 4th, 6th - pupils equal and reactive to light, visual fields full to confrontation, extraocular muscles intact, no nystagmus  5th - facial sensation symmetric  7th - facial strength symmetric  8th - hearing intact  9th - palate elevates symmetrically, uvula midline  11th - shoulder shrug symmetric  12th - tongue protrusion midline  MOTOR:   normal bulk and tone, full strength in the BUE, BLE  SENSORY:   normal and symmetric to light touch, temperature, vibration  COORDINATION:   finger-nose-finger, fine finger movements normal  REFLEXES:   deep tendon reflexes present and symmetric  GAIT/STATION:   narrow based gait     DIAGNOSTIC  DATA (LABS, IMAGING, TESTING) - I reviewed patient records, labs, notes, testing and imaging myself where available.  Lab Results  Component Value Date   WBC 4.7 04/25/2019   HGB 12.3 04/25/2019   HCT 39.1 04/25/2019   MCV 96.5 04/25/2019   PLT 205 04/25/2019      Component Value Date/Time   NA 141 09/25/2019 0913   K 4.9 09/25/2019 0913   CL 102 09/25/2019 0913   CO2 24 09/25/2019 0913   GLUCOSE 89 09/25/2019 0913   GLUCOSE 100 (H) 04/25/2019 1244   BUN 12 09/25/2019 0913   CREATININE 0.86 09/25/2019 0913   CREATININE 0.77 10/21/2018 1034   CALCIUM 9.6 09/25/2019 0913   PROT 7.7 04/25/2019 1244   PROT 6.7 12/27/2015 1124   ALBUMIN 4.7 04/25/2019 1244   ALBUMIN 4.4 12/27/2015 1124   AST 13 (L) 04/25/2019 1244   ALT 12 04/25/2019 1244   ALKPHOS 49 04/25/2019 1244   BILITOT 0.8 04/25/2019 1244   BILITOT 0.3 12/27/2015 1124   GFRNONAA 74 09/25/2019 0913   GFRNONAA 85 10/21/2018 1034   GFRAA 85 09/25/2019 0913   GFRAA 98 10/21/2018 1034   Lab Results  Component Value Date   CHOL 200 (H) 11/10/2019   HDL 73 11/10/2019   LDLCALC 108 (H) 11/10/2019   TRIG 111 11/10/2019   CHOLHDL 2.7 11/10/2019   Lab Results  Component Value Date   HGBA1C 5.7 01/17/2017   Lab Results  Component Value Date   VITAMINB12 362 12/27/2015   Lab Results  Component Value Date   TSH 0.873 09/25/2019    12/19/13 MRI brain - Normal MRI scan of the brain without contrast.   01/28/14 PSG - moderate OSA with REM sleep   ASSESSMENT AND PLAN  59 y.o. year old female here with with migraine without aura since age [age] years old. Symptoms worse with stress.   Dx:  1. New daily persistent headache   2. Migraine with aura and without status migrainosus, not intractable   3. Dizziness  PLAN:  INCREASING HEADACHES (h/o migraine with aura) - check MRI brain due to increasing severity and change in quality (more dizziness, increased intensity, increased frequency) - patient declines  additional migraine treatments - patient declines CPAP treatment for OSA  Orders Placed This Encounter  Procedures  . MR BRAIN W WO CONTRAST   Return for return to PCP, pending if symptoms worsen or fail to improve.    Penni Bombard, MD [id number], [id number] AM Certified in Neurology, Neurophysiology and Neuroimaging  [facility] [address], [place], [place] [address] [phone]

## 2019-11-25 ENCOUNTER — Ambulatory Visit (INDEPENDENT_AMBULATORY_CARE_PROVIDER_SITE_OTHER): Payer: Medicare HMO | Admitting: Internal Medicine

## 2019-11-25 ENCOUNTER — Telehealth: Payer: Self-pay | Admitting: Diagnostic Neuroimaging

## 2019-11-25 VITALS — BP 95/62 | HR 86 | Temp 98.6°F | Ht 65.0 in | Wt 170.0 lb

## 2019-11-25 DIAGNOSIS — K625 Hemorrhage of anus and rectum: Secondary | ICD-10-CM

## 2019-11-25 LAB — CBC
HCT: 36.9 % (ref 36.0–46.0)
Hemoglobin: 11.8 g/dL — ABNORMAL LOW (ref 12.0–15.0)
MCH: 30.9 pg (ref 26.0–34.0)
MCHC: 32 g/dL (ref 30.0–36.0)
MCV: 96.6 fL (ref 80.0–100.0)
Platelets: 201 10*3/uL (ref 150–400)
RBC: 3.82 MIL/uL — ABNORMAL LOW (ref 3.87–5.11)
RDW: 13.2 % (ref 11.5–15.5)
WBC: 4.4 10*3/uL (ref 4.0–10.5)
nRBC: 0 % (ref 0.0–0.2)

## 2019-11-25 MED ORDER — POLYETHYLENE GLYCOL 3350 17 GM/SCOOP PO POWD: 1.0000 | Freq: Once | ORAL | 0 refills | Status: AC

## 2019-11-25 NOTE — Telephone Encounter (Signed)
Mcarthur Rossetti Josem Kaufmann: [id number] (exp. 11/25/19 to 12/25/19)/medicaid order sent to GI. They will reach out to the patient to schedule.

## 2019-11-25 NOTE — Telephone Encounter (Signed)
[facility] pending faxed notes  

## 2019-11-25 NOTE — Patient Instructions (Addendum)
Nicole [name], your blood count is stable, take one scoop of miralax daily to achieve a soft bowel movement daily if no improvement in the consistency in 4 days increase to 2 scoops daily.  If your pain worsens or you have increased blood in your stool please go to the emergency room, otherwise please follow up with your GI doctor.

## 2019-11-25 NOTE — Progress Notes (Signed)
CC: BRBPR  HPI:  Nicole [name] is a 59 y.o. female with PMH below.  Today we will address BRBPR    Please see A&P for status of the patient's chronic medical conditions  Past Medical History:  Diagnosis Date  . Anemia   . Depression    bipolar  . GERD (gastroesophageal reflux disease)   . History of migraine headaches    headaches reported as migraines  . Hypercholesterolemia   . Hypotension   . Obesity   . Palpitations    negative cardiac w/u per Dr. Verl Blalock 2/09  . Reflux esophagitis   . Sleep apnea    No CPAP   Review of Systems:  ROS: Pulmonary: pt denies increased work of breathing, shortness of breath,  Cardiac: pt denies palpitations, chest pain,  Abdominal: pt denies abdominal pain, nausea, vomiting, or diarrhea   Physical Exam:  Vitals:   11/25/19 1315  BP: 95/62  Pulse: 86  Temp: 98.6 F (37 C)  TempSrc: Oral  SpO2: 98%  Weight: 170 lb (77.1 kg)  Height: 5\' 5"  (1.651 m)   Cardiac: JVD flat, normal rate and rhythm, clear s1 and s2, no murmurs, rubs or gallops, no LE edema Pulmonary: CTAB, not in distress Abdominal: soft, non distended abdomen, exaggerated tenderness of LLQ Psych: Alert, conversant, in good spirits   Social History   Socioeconomic History  . Marital status: Single    Spouse name: Not on file  . Number of children: 3  . Years of education: Not on file  . Highest education level: Not on file  Occupational History  . Occupation: Agricultural consultant: UNEMPLOYED    Comment: Unemployed, on disability  Tobacco Use  . Smoking status: Former Smoker    Packs/day: 0.25    Years: 36.00    Pack years: 9.00    Types: Cigarettes    Quit date: 09/19/2019    Years since quitting: 0.1  . Smokeless tobacco: Never Used  . Tobacco comment: 2 cigs and 4 marijuana daily. wants to quit both.  Substance and Sexual Activity  . Alcohol use: Yes    Alcohol/week: 0.0 standard drinks    Comment: Rare glass of wine  . Drug use:  Yes    Types: Marijuana    Comment: 11/24/19 daily marijuana use   . Sexual activity: Never  Other Topics Concern  . Not on file  Social History Narrative   Patient lives at home alone.   Caffeine Use: none         Epworth Sleepiness Scale = 12 (as of 09/13/2015)   Social Determinants of Health   Financial Resource Strain:   . Difficulty of Paying Living Expenses:   Food Insecurity:   . Worried About Charity fundraiser in the Last Year:   . Arboriculturist in the Last Year:   Transportation Needs:   . Film/video editor (Medical):   Marland Kitchen Lack of Transportation (Non-Medical):   Physical Activity:   . Days of Exercise per Week:   . Minutes of Exercise per Session:   Stress:   . Feeling of Stress :   Social Connections:   . Frequency of Communication with Friends and Family:   . Frequency of Social Gatherings with Friends and Family:   . Attends Religious Services:   . Active Member of Clubs or Organizations:   . Attends Archivist Meetings:   Marland Kitchen Marital Status:   Intimate Production manager  Violence:   . Fear of Current or Ex-Partner:   . Emotionally Abused:   Marland Kitchen Physically Abused:   . Sexually Abused:     Family History  Problem Relation Age of Onset  . Hypertension Mother   . Diabetes Mother   . Heart disease Mother   . Thyroid disease Mother   . Heart attack Mother   . Glaucoma Mother   . Pancreatic cancer Other        family history  . Heart attack Maternal Grandmother   . Heart attack Maternal Grandfather   . Breast cancer Maternal Aunt   . Pancreatic cancer Maternal Uncle   . Diabetes Sister   . Hypertension Sister   . Colon cancer Neg Hx     Assessment & Plan:   See Encounters Tab for problem based charting.  Patient discussed with Dr. Angelia Mould

## 2019-11-26 ENCOUNTER — Encounter: Payer: Self-pay | Admitting: Internal Medicine

## 2019-11-26 DIAGNOSIS — K625 Hemorrhage of anus and rectum: Secondary | ICD-10-CM | POA: Insufficient documentation

## 2019-11-26 NOTE — Assessment & Plan Note (Addendum)
2 days of bright red blood per rectum.  She has had no diarrhea just one bowel movement per day semi solid with blood on the outside not mixed in.  No frank bloody bm this morning like before only one bowel movement which was blood tinged.  She takes no blood thinners other than PRN asa for headaches, last headache once last week.  She reports using her fingers through her vagina to help pass her stool.  She also reports LLQ pain 8/10 which also began 2 days ago. She exaggerates her pain during physical exam and reports pain before I apply any pressure during the abdominal exam.  When distracted she is in no discomfort.  Her initial bp was slightly low, negative orthostatic vital signs, her baseline bp is low, this improved with oral hydration. She refused DRE exam today.  I expect this bleeding is related to constipation she gives a mixed history saying she doesn't have trouble with constipation and has semisolid bm but also describing having to advance her stool manually.  She has follow up with GI in 1-2 weeks.  Performed cbc hemoglobin is stable,no leukocytosis, she is very eager to get home.  -prescribed miralax stool softener -given return precautions increased bleeding, pain, signs of systemic illness to go straight to the ED

## 2019-11-27 NOTE — Progress Notes (Signed)
Internal Medicine Clinic Attending  Case discussed with Dr. Winfrey  at the time of the visit.  We reviewed the resident's history and exam and pertinent patient test results.  I agree with the assessment, diagnosis, and plan of care documented in the resident's note.  

## 2019-12-08 ENCOUNTER — Ambulatory Visit
Admission: RE | Admit: 2019-12-08 | Discharge: 2019-12-08 | Disposition: A | Discharge status: Home / Self Care | Payer: Medicare HMO | Source: Ambulatory Visit | Attending: Internal Medicine | Admitting: Internal Medicine

## 2019-12-08 ENCOUNTER — Encounter: Payer: Self-pay | Admitting: Nurse Practitioner

## 2019-12-08 ENCOUNTER — Other Ambulatory Visit: Payer: Self-pay

## 2019-12-08 ENCOUNTER — Ambulatory Visit (INDEPENDENT_AMBULATORY_CARE_PROVIDER_SITE_OTHER): Payer: Medicare HMO | Admitting: Nurse Practitioner

## 2019-12-08 VITALS — BP 121/64 | HR 73 | Temp 97.6°F | Ht 65.0 in | Wt 172.6 lb

## 2019-12-08 DIAGNOSIS — G8929 Other chronic pain: Secondary | ICD-10-CM | POA: Diagnosis not present

## 2019-12-08 DIAGNOSIS — K625 Hemorrhage of anus and rectum: Secondary | ICD-10-CM | POA: Diagnosis not present

## 2019-12-08 DIAGNOSIS — N63 Unspecified lump in unspecified breast: Secondary | ICD-10-CM

## 2019-12-08 DIAGNOSIS — Z01818 Encounter for other preprocedural examination: Secondary | ICD-10-CM | POA: Diagnosis not present

## 2019-12-08 DIAGNOSIS — Z1239 Encounter for other screening for malignant neoplasm of breast: Secondary | ICD-10-CM

## 2019-12-08 DIAGNOSIS — R109 Unspecified abdominal pain: Secondary | ICD-10-CM

## 2019-12-08 DIAGNOSIS — N6314 Unspecified lump in the right breast, lower inner quadrant: Secondary | ICD-10-CM | POA: Diagnosis not present

## 2019-12-08 DIAGNOSIS — R922 Inconclusive mammogram: Secondary | ICD-10-CM | POA: Diagnosis not present

## 2019-12-08 MED ORDER — SUTAB 1479-225-188 MG PO TABS: 1.0000 | ORAL_TABLET | ORAL | 0 refills | Status: DC

## 2019-12-08 NOTE — Progress Notes (Signed)
ASSESSMENT / PLAN:   #Rectal bleeding in setting of constipation. --Constipation and bleeding have resolved.  Patient was supposed to have a colonoscopy last year for bowel changes but it got postponed due to a personal illness and concern for Covid pandemic. Will get this rescheduled for her,  especially in light of the rectal bleeding. The risks and benefits of colonoscopy with possible polypectomy / biopsies were discussed and the patient agrees to proceed.   HPI:    Primary GI:  Dr. Carlean Purl Chief Complaint: rectal bleeding.     ** History comes from chart and   Nicole [name] is a 59 year old who last saw Dr. Carlean Purl February 2020 for evaluation of bowel changes.  She was not bleeding but was fearful of cancer.  We felt bowel changes were from a symptomatic rectocele but arranged for colonoscopy to relieve her anxiety.  This was scheduled but had to be rescheduled due to patient being ill. The colonoscopy was cancelled again due to Covid pandemic.  Now patient comes in with rectal bleeding.  She admits to being recently constipated prior to the bleeding.  She had dark red blood with bowel movements on 2 occasions week before last.  No associated rectal pain. After three doses of Miralax and some dietary changes constipation resolved.  Patient did see her PCP for the bleeding on 11/25/2019, please read that dictation.  CBC was drawn that day. Hgb was 11.8 at baseline which is in mid 11 to mid 12 range)  Ms. Manalang gives a history of generalized abdominal pain.  We did recommend she see surgery last year for consideration of a umbilical hernia repair.  Patient says she did see a Psychologist, sport and exercise and was offered surgery but also told it was not emergent so she held off.  She still gets this generalized pain on a regular basis.  The pain is not related to her bowel movements nor eating.    Past Medical History:  Diagnosis Date  . Anemia   . Depression    bipolar  . GERD (gastroesophageal  reflux disease)   . History of migraine headaches    headaches reported as migraines  . Hypercholesterolemia   . Hypotension   . Obesity   . Palpitations    negative cardiac w/u per Dr. Verl Blalock 2/09  . Reflux esophagitis   . Sleep apnea    No CPAP    Past Surgical History:  Procedure Laterality Date  . COLONOSCOPY    . ECTOPIC PREGNANCY SURGERY    . EXPLORATORY LAPAROTOMY  2000   x2 (Dr. Kendall Flack)  . HERPES SIMPLEX VIRUS DFA    . TUBAL LIGATION  1987    Current Outpatient Medications  Medication Sig Dispense Refill  . Sodium Sulfate-Mag Sulfate-KCl (SUTAB) [phone] MG TABS Take 1 kit by mouth as directed. 45 tablet 0   No current facility-administered medications for this visit.   Allergies  Allergen Reactions  . Other Itching and Hives    Pain medication ordered at [facility] in 2009 - Patient does not know name of medication  Pain medication given at [facility] approximately 2009 Pain medication ordered at [facility] in 2009 - Patient does not know name of medication  Pain medication given at [facility] approximately 2009     Review of Systems: No SOB, no chest pain. No urinary symptoms. .   Creatinine clearance cannot be calculated (Patient's most  recent lab result is older than the maximum 21 days allowed.)   Physical Exam:    Wt Readings from Last 3 Encounters:  12/08/19 172 lb 9.6 oz (78.3 kg)  11/25/19 170 lb (77.1 kg)  11/24/19 168 lb (76.2 kg)    BP 121/64   Pulse 73   Temp 97.6 F (36.4 C)   Ht '5\' 5"'$  (1.651 m)   Wt 172 lb 9.6 oz (78.3 kg)   LMP 03/03/2016 (Approximate)   SpO2 98%   BMI 28.72 kg/m  Constitutional:  Pleasant female in no acute distress. Psychiatric: Normal mood and affect. Behavior is normal. EENT: Pupils normal.  Conjunctivae are normal. No scleral icterus. Neck supple.  Cardiovascular: Normal rate, regular rhythm. No edema Pulmonary/chest: Effort normal and breath sounds normal. No wheezing,  rales or rhonchi. Abdominal: Soft, nondistended, nontender. Bowel sounds active throughout. There are no masses palpable. No hepatomegaly. Neurological: Alert and oriented to person place and time. Skin: Skin is warm and dry. No rashes noted.  Tye Savoy, NP  12/08/2019, 3:48 PM I spent 30 minutes total reviewing records, obtaining history, performing exam, counseling patient and documenting visit / findings.

## 2019-12-08 NOTE — Patient Instructions (Addendum)
If you are age 61 or older, your body mass index should be between 23-30. Your Body mass index is 28.72 kg/m. If this is out of the aforementioned range listed, please consider follow up with your Primary Care Provider.  If you are age 32 or younger, your body mass index should be between 19-25. Your Body mass index is 28.72 kg/m. If this is out of the aformentioned range listed, please consider follow up with your Primary Care Provider.   You have been scheduled for a colonoscopy. Please follow written instructions given to you at your visit today.  Please pick up your prep supplies at the pharmacy within the next 1-3 days. If you use inhalers (even only as needed), please bring them with you on the day of your procedure.   Follow up pending the results of you colonscopy.

## 2019-12-09 ENCOUNTER — Other Ambulatory Visit: Payer: Self-pay

## 2019-12-09 ENCOUNTER — Telehealth: Payer: Self-pay | Admitting: Nurse Practitioner

## 2019-12-09 ENCOUNTER — Encounter: Payer: Self-pay | Admitting: Nurse Practitioner

## 2019-12-09 MED ORDER — NA SULFATE-K SULFATE-MG SULF 17.5-3.13-1.6 GM/177ML PO SOLN: ORAL | 0 refills | Status: DC

## 2019-12-09 NOTE — Telephone Encounter (Signed)
Patient called states she has a phobia with pills and wants to know if she can be given another type of prep

## 2019-12-09 NOTE — Telephone Encounter (Signed)
Patient contacted. She would prefer to take the liquid for her prep. She has not purchased the previous prescription. Understands that the insurance may not cover Suprep and that she will need new instructions. Agrees to this. If insurance does not cover Suprep, will change the prep to the Miralax prep.

## 2019-12-10 ENCOUNTER — Other Ambulatory Visit: Payer: Self-pay

## 2019-12-10 NOTE — Telephone Encounter (Signed)
Suprep is not covered by her insurance.  Patient will change this to the Miralax prep. New instructions mailed to her.

## 2019-12-16 DIAGNOSIS — J392 Other diseases of pharynx: Secondary | ICD-10-CM | POA: Diagnosis not present

## 2019-12-16 DIAGNOSIS — R490 Dysphonia: Secondary | ICD-10-CM | POA: Diagnosis not present

## 2019-12-18 ENCOUNTER — Ambulatory Visit (INDEPENDENT_AMBULATORY_CARE_PROVIDER_SITE_OTHER): Payer: Medicare HMO

## 2019-12-18 ENCOUNTER — Telehealth: Payer: Self-pay

## 2019-12-18 ENCOUNTER — Other Ambulatory Visit: Payer: Self-pay | Admitting: Internal Medicine

## 2019-12-18 DIAGNOSIS — Z1159 Encounter for screening for other viral diseases: Secondary | ICD-10-CM | POA: Diagnosis not present

## 2019-12-18 NOTE — Telephone Encounter (Signed)
Patient was originally scheduled to see Dr. Carlean Purl on 12/22/19 at 3pm. Dr. Carlean Purl had some openings and I was able to move patient up to the 2pm slot. Pt was not at home at the time of original call to write down her new prep times. I tried to call her back, but was unable to reach her. Left voicemail to have patient call office back for new prep times, and that I will try to reach her again Monday morning.

## 2019-12-19 LAB — SARS CORONAVIRUS 2 (TAT 6-24 HRS): SARS Coronavirus 2: NEGATIVE

## 2019-12-21 ENCOUNTER — Ambulatory Visit
Admission: RE | Admit: 2019-12-21 | Discharge: 2019-12-21 | Disposition: A | Discharge status: Home / Self Care | Payer: Medicare HMO | Source: Ambulatory Visit | Attending: Diagnostic Neuroimaging | Admitting: Diagnostic Neuroimaging

## 2019-12-21 ENCOUNTER — Telehealth: Payer: Self-pay

## 2019-12-21 ENCOUNTER — Telehealth: Payer: Self-pay | Admitting: Internal Medicine

## 2019-12-21 ENCOUNTER — Other Ambulatory Visit: Payer: Self-pay

## 2019-12-21 DIAGNOSIS — R42 Dizziness and giddiness: Secondary | ICD-10-CM | POA: Diagnosis not present

## 2019-12-21 DIAGNOSIS — G4452 New daily persistent headache (NDPH): Secondary | ICD-10-CM | POA: Diagnosis not present

## 2019-12-21 MED ORDER — GADOBENATE DIMEGLUMINE 529 MG/ML IV SOLN
15.0000 mL | Freq: Once | INTRAVENOUS | Status: AC | PRN
  Administered 2019-12-21: 15 mL via INTRAVENOUS

## 2019-12-21 NOTE — Telephone Encounter (Signed)
Dr. Carlean Purl, pt rescheduled her 12/22/19 colonoscopy to 01/19/20.  She reported that she is having lung issues and will see pulmonologist first.

## 2019-12-21 NOTE — Telephone Encounter (Signed)
Pt is scheduled to see Dr. Carlean Purl on 12/22/19 at 2pm. Pts original time was scheduled for 3pm, pt agreed to come in 1hr early for procedure. Tried to call patient back multiple times on Friday 4/30 to make sure pt had correct prep times for procedure but I was unable to reach patient. Tried to reach patient again today, but was unable to get a hold of patient. Left a detailed voice message with correct prep times for the patient, and office number to call if she has any questions.

## 2019-12-21 NOTE — Telephone Encounter (Signed)
Patient called states she has a lung issue and she is requesting to speak with a nurse has procedure scheduled for tomorrow and she doesn't know if she should wait until her appt 01/13/20 with [facility]

## 2019-12-21 NOTE — Telephone Encounter (Signed)
Tried to reach the patient x2. Left a message to call back to discuss. If she wants to reschedule we need to know asap.

## 2019-12-22 ENCOUNTER — Encounter: Payer: Medicare HMO | Admitting: Internal Medicine

## 2019-12-24 ENCOUNTER — Telehealth: Payer: Self-pay

## 2019-12-24 NOTE — Telephone Encounter (Signed)
I contacted the pt and advised of MRI finding. She verbalized understanding and had no questions at this time.

## 2019-12-24 NOTE — Telephone Encounter (Signed)
-----   Message from Penni Bombard, MD sent at 12/24/2019  2:18 PM EDT ----- Unremarkable imaging results. Please call patient. Continue current plan. -VRP

## 2020-01-12 ENCOUNTER — Telehealth: Payer: Self-pay | Admitting: Nurse Practitioner

## 2020-01-12 NOTE — Telephone Encounter (Signed)
Tried to contact patient and got a busy signal. Will try to reach the patient again.

## 2020-01-12 NOTE — Telephone Encounter (Signed)
Tried to contact patient again. Phone rang 2 times, then went busy. Will try to contact patient again.

## 2020-01-12 NOTE — Telephone Encounter (Signed)
Pt called requesting prep instructions. She stated that her prep was changed from pills to suprep and she never received instructions for that. Her procedure is on 01/19/20.

## 2020-01-13 ENCOUNTER — Encounter: Payer: Self-pay | Admitting: Pulmonary Disease

## 2020-01-13 ENCOUNTER — Ambulatory Visit (INDEPENDENT_AMBULATORY_CARE_PROVIDER_SITE_OTHER): Payer: Medicare HMO | Admitting: Pulmonary Disease

## 2020-01-13 ENCOUNTER — Other Ambulatory Visit: Payer: Self-pay

## 2020-01-13 VITALS — BP 106/62 | HR 81 | Temp 98.7°F | Ht 65.5 in | Wt 170.8 lb

## 2020-01-13 DIAGNOSIS — R079 Chest pain, unspecified: Secondary | ICD-10-CM

## 2020-01-13 MED ORDER — BUDESONIDE-FORMOTEROL FUMARATE 160-4.5 MCG/ACT IN AERO
2.0000 | INHALATION_SPRAY | Freq: Two times a day (BID) | RESPIRATORY_TRACT | 5 refills | Status: DC
Start: 2020-01-13 — End: 2020-08-25

## 2020-01-13 NOTE — Telephone Encounter (Signed)
Tried to contact patient on both cell phone and home phone. Cell phone only rang twice and went busy, unable to leave a message, home number rang multiple times with no answer and unable to leave a voicemail.

## 2020-01-13 NOTE — Progress Notes (Signed)
Nicole [name]    [id number]    March 11, 1961  Primary Care Physician:Lee, Dimple Nanas, MD  Referring Physician: [name], MD [address],  [place] [address]  Chief complaint:  Consult for lung pain  HPI: 59 year old with history of GERD, depression, sleep apnea Complains of throat irritation, chest tightness.  This usually occurs after she smokes marijuana.  No cough, sputum production, fevers, chills Evaluated by Dr. Redmond Baseman, ENT with no abnormalities noted.  She was advised to quit smoking. Not on inhalers at present.  She has a diagnosis of sleep apnea but intolerant of CPAP. Being evaluated for rectal bleeding and scheduled for colonoscopy  Pets: No pets Occupation: She did odd jobs.  Currently on disability Exposures: Reports mold at home.  No hot tub, Jacuzzi or down close or comforters Smoking history: Smokes marijuana every day and an occasional cigarette Travel history: No significant travel history Relevant family history: No significant family history of lung disease   Outpatient Encounter Medications as of 01/13/2020  Medication Sig  . aspirin 325 MG EC tablet Take 325 mg by mouth daily. Takes 1/2 tab prn headaches  . Na Sulfate-K Sulfate-Mg Sulf 17.5-3.13-1.6 GM/177ML SOLN Follow instructions from the doctor's office (Patient not taking: Reported on 01/13/2020)   No facility-administered encounter medications on file as of 01/13/2020.    Allergies as of 01/13/2020 - Review Complete 01/13/2020  Allergen Reaction Noted  . Other Itching and Hives 04/30/2012    Past Medical History:  Diagnosis Date  . Anemia   . Depression    bipolar  . GERD (gastroesophageal reflux disease)   . History of migraine headaches    headaches reported as migraines  . Hypercholesterolemia   . Hypotension   . Obesity   . Palpitations    negative cardiac w/u per Dr. Verl Blalock 2/09  . Reflux esophagitis   . Sleep apnea    No CPAP    Past Surgical History:    Procedure Laterality Date  . COLONOSCOPY    . ECTOPIC PREGNANCY SURGERY    . EXPLORATORY LAPAROTOMY  2000   x2 (Dr. Kendall Flack)  . HERPES SIMPLEX VIRUS DFA    . TUBAL LIGATION  1987    Family History  Problem Relation Age of Onset  . Hypertension Mother   . Diabetes Mother   . Heart disease Mother   . Thyroid disease Mother   . Heart attack Mother   . Glaucoma Mother   . Pancreatic cancer Other        family history  . Heart attack Maternal Grandmother   . Heart attack Maternal Grandfather   . Pancreatic cancer Maternal Uncle   . Diabetes Sister   . Hypertension Sister   . Colon cancer Neg Hx     Social History   Socioeconomic History  . Marital status: Single    Spouse name: Not on file  . Number of children: 3  . Years of education: Not on file  . Highest education level: Not on file  Occupational History  . Occupation: Agricultural consultant: UNEMPLOYED    Comment: Unemployed, on disability  Tobacco Use  . Smoking status: Former Smoker    Packs/day: 0.25    Years: 36.00    Pack years: 9.00    Types: Cigarettes    Quit date: 12/30/2019    Years since quitting: 0.0  . Smokeless tobacco: Never Used  Substance  and Sexual Activity  . Alcohol use: Yes    Alcohol/week: 0.0 standard drinks    Comment: Rare glass of wine  . Drug use: Yes    Types: Marijuana    Comment: 01/13/20 2-3 daily  marijuana use   . Sexual activity: Never  Other Topics Concern  . Not on file  Social History Narrative   Patient lives at home alone.   Caffeine Use: none         Epworth Sleepiness Scale = 12 (as of 09/13/2015)   Social Determinants of Health   Financial Resource Strain:   . Difficulty of Paying Living Expenses:   Food Insecurity:   . Worried About Charity fundraiser in the Last Year:   . Arboriculturist in the Last Year:   Transportation Needs:   . Film/video editor (Medical):   Marland Kitchen Lack of Transportation (Non-Medical):   Physical Activity:    . Days of Exercise per Week:   . Minutes of Exercise per Session:   Stress:   . Feeling of Stress :   Social Connections:   . Frequency of Communication with Friends and Family:   . Frequency of Social Gatherings with Friends and Family:   . Attends Religious Services:   . Active Member of Clubs or Organizations:   . Attends Archivist Meetings:   Marland Kitchen Marital Status:   Intimate Partner Violence:   . Fear of Current or Ex-Partner:   . Emotionally Abused:   Marland Kitchen Physically Abused:   . Sexually Abused:     Review of systems: Review of Systems  Constitutional: Negative for fever and chills.  HENT: Negative.   Eyes: Negative for blurred vision.  Respiratory: as per HPI  Cardiovascular: Negative for chest pain and palpitations.  Gastrointestinal: Negative for vomiting, diarrhea, blood per rectum. Genitourinary: Negative for dysuria, urgency, frequency and hematuria.  Musculoskeletal: Negative for myalgias, back pain and joint pain.  Skin: Negative for itching and rash.  Neurological: Negative for dizziness, tremors, focal weakness, seizures and loss of consciousness.  Endo/Heme/Allergies: Negative for environmental allergies.  Psychiatric/Behavioral: Negative for depression, suicidal ideas and hallucinations.  All other systems reviewed and are negative.  Physical Exam: Blood pressure 106/62, pulse 81, temperature 98.7 F (37.1 C), temperature source Oral, height 5' 5.5" (1.664 m), weight 170 lb 12.8 oz (77.5 kg), last menstrual period 03/03/2016, SpO2 97 %. Gen:      No acute distress HEENT:  EOMI, sclera anicteric Neck:     No masses; no thyromegaly Lungs:    Clear to auscultation bilaterally; normal respiratory effort CV:         Regular rate and rhythm; no murmurs Abd:      + bowel sounds; soft, non-tender; no palpable masses, no distension Ext:    No edema; adequate peripheral perfusion Skin:      Warm and dry; no rash Neuro: alert and oriented x 3 Psych: normal  mood and affect  Data Reviewed: Imaging: Chest x-ray 04/25/2019-no active disease.  I have reviewed the images personally.  PFTs:  Labs: CBC 10/21/2018-WBC 4.3, eos 2.7%, absolute eosinophil count 159  Assessment:  Chest discomfort Atypical presentation this is likely related to her ongoing marijuana smoking.  She does have mold exposure at home which may cause reactive airway disease Evaluate with CBC differential, IgE and hypersensitivity panel Chest x-ray and PFTs We will trial Symbicort 160 Smoking cessation discussed with patient.  OSA Intolerant of CPAP.  Will address at return  visit  Plan/Recommendations: CBC, IgE, HP Chest x-ray, PFTs Symbicort  Marshell Garfinkel MD [facility] 01/13/2020, 10:24 AM  CC: [name], MD

## 2020-01-13 NOTE — Telephone Encounter (Signed)
Tried to contact patient on both cell phone and home number with no success. Tried to call mother's number, who is listed as her emergency contact and left voicemail to have her daughter to call the office.

## 2020-01-13 NOTE — Addendum Note (Signed)
Addended by: Elton Sin on: 01/13/2020 10:54 AM   Modules accepted: Orders

## 2020-01-13 NOTE — Patient Instructions (Signed)
We will check some labs today including hypersensitivity panel, CBC differential, IgE Schedule chest x-ray, pulmonary function test Trial of Symbicort 160  Follow-up in 1 to 2 months.

## 2020-01-14 ENCOUNTER — Ambulatory Visit (INDEPENDENT_AMBULATORY_CARE_PROVIDER_SITE_OTHER): Payer: Medicare HMO

## 2020-01-14 ENCOUNTER — Other Ambulatory Visit (INDEPENDENT_AMBULATORY_CARE_PROVIDER_SITE_OTHER): Payer: Medicare HMO

## 2020-01-14 DIAGNOSIS — R079 Chest pain, unspecified: Secondary | ICD-10-CM | POA: Diagnosis not present

## 2020-01-14 DIAGNOSIS — R0602 Shortness of breath: Secondary | ICD-10-CM | POA: Diagnosis not present

## 2020-01-14 LAB — CBC WITH DIFFERENTIAL/PLATELET
Basophils Absolute: 0 10*3/uL (ref 0.0–0.1)
Basophils Relative: 0.9 % (ref 0.0–3.0)
Eosinophils Absolute: 0.2 10*3/uL (ref 0.0–0.7)
Eosinophils Relative: 5.5 % — ABNORMAL HIGH (ref 0.0–5.0)
HCT: 38.2 % (ref 36.0–46.0)
Hemoglobin: 12.8 g/dL (ref 12.0–15.0)
Lymphocytes Relative: 55.6 % — ABNORMAL HIGH (ref 12.0–46.0)
Lymphs Abs: 2.3 10*3/uL (ref 0.7–4.0)
MCHC: 33.6 g/dL (ref 30.0–36.0)
MCV: 94.9 fl (ref 78.0–100.0)
Monocytes Absolute: 0.4 10*3/uL (ref 0.1–1.0)
Monocytes Relative: 8.5 % (ref 3.0–12.0)
Neutro Abs: 1.2 10*3/uL — ABNORMAL LOW (ref 1.4–7.7)
Neutrophils Relative %: 29.5 % — ABNORMAL LOW (ref 43.0–77.0)
Platelets: 230 10*3/uL (ref 150.0–400.0)
RBC: 4.02 Mil/uL (ref 3.87–5.11)
RDW: 13.5 % (ref 11.5–15.5)
WBC: 4.2 10*3/uL (ref 4.0–10.5)

## 2020-01-14 NOTE — Telephone Encounter (Signed)
Patient came by the office this morning, she was scheduled for her covid screening and was given an updated instructions for her colon prep on 01/19/20.

## 2020-01-15 ENCOUNTER — Ambulatory Visit (INDEPENDENT_AMBULATORY_CARE_PROVIDER_SITE_OTHER): Payer: Medicare HMO

## 2020-01-15 ENCOUNTER — Other Ambulatory Visit: Payer: Self-pay | Admitting: Internal Medicine

## 2020-01-15 DIAGNOSIS — Z1159 Encounter for screening for other viral diseases: Secondary | ICD-10-CM | POA: Diagnosis not present

## 2020-01-15 LAB — SARS CORONAVIRUS 2 (TAT 6-24 HRS): SARS Coronavirus 2: NEGATIVE

## 2020-01-15 LAB — IGE: IgE (Immunoglobulin E), Serum: 36 kU/L (ref <=114)

## 2020-01-19 ENCOUNTER — Other Ambulatory Visit: Payer: Self-pay

## 2020-01-19 ENCOUNTER — Encounter: Payer: Self-pay | Admitting: Internal Medicine

## 2020-01-19 ENCOUNTER — Ambulatory Visit (AMBULATORY_SURGERY_CENTER): Payer: Medicare HMO | Admitting: Internal Medicine

## 2020-01-19 VITALS — BP 108/72 | HR 58 | Temp 96.8°F | Resp 9 | Ht 65.0 in | Wt 172.0 lb

## 2020-01-19 DIAGNOSIS — R109 Unspecified abdominal pain: Secondary | ICD-10-CM | POA: Diagnosis not present

## 2020-01-19 DIAGNOSIS — K625 Hemorrhage of anus and rectum: Secondary | ICD-10-CM | POA: Diagnosis not present

## 2020-01-19 DIAGNOSIS — D123 Benign neoplasm of transverse colon: Secondary | ICD-10-CM

## 2020-01-19 DIAGNOSIS — K5730 Diverticulosis of large intestine without perforation or abscess without bleeding: Secondary | ICD-10-CM

## 2020-01-19 DIAGNOSIS — G4733 Obstructive sleep apnea (adult) (pediatric): Secondary | ICD-10-CM | POA: Diagnosis not present

## 2020-01-19 DIAGNOSIS — K219 Gastro-esophageal reflux disease without esophagitis: Secondary | ICD-10-CM | POA: Diagnosis not present

## 2020-01-19 DIAGNOSIS — E7800 Pure hypercholesterolemia, unspecified: Secondary | ICD-10-CM | POA: Diagnosis not present

## 2020-01-19 LAB — HYPERSENSITIVITY PNEUMONITIS
A. Pullulans Abs: NEGATIVE
A.Fumigatus #1 Abs: NEGATIVE
Micropolyspora faeni, IgG: NEGATIVE
Pigeon Serum Abs: NEGATIVE
Thermoact. Saccharii: NEGATIVE
Thermoactinomyces vulgaris, IgG: NEGATIVE

## 2020-01-19 MED ORDER — SODIUM CHLORIDE 0.9 % IV SOLN: 500.0000 mL | INTRAVENOUS | Status: DC

## 2020-01-19 NOTE — Patient Instructions (Addendum)
I found and removed one tiny polyp. I think the bleeding was from straining and possibly hard stool - no abnormalities linked to bleeding seen today.  I will let you know pathology results and when to have another routine colonoscopy by mail and/or My Chart.  I appreciate the opportunity to care for you. Gatha Mayer, MD, FACG      YOU HAD AN ENDOSCOPIC PROCEDURE TODAY AT [facility]:   Refer to the procedure report that was given to you for any specific questions about what was found during the examination.  If the procedure report does not answer your questions, please call your gastroenterologist to clarify.  If you requested that your care partner not be given the details of your procedure findings, then the procedure report has been included in a sealed envelope for you to review at your convenience later.  YOU SHOULD EXPECT: Some feelings of bloating in the abdomen. Passage of more gas than usual.  Walking can help get rid of the air that was put into your GI tract during the procedure and reduce the bloating. If you had a lower endoscopy (such as a colonoscopy or flexible sigmoidoscopy) you may notice spotting of blood in your stool or on the toilet paper. If you underwent a bowel prep for your procedure, you may not have a normal bowel movement for a few days.  Please Note:  You might notice some irritation and congestion in your nose or some drainage.  This is from the oxygen used during your procedure.  There is no need for concern and it should clear up in a day or so.  SYMPTOMS TO REPORT IMMEDIATELY:   Following lower endoscopy (colonoscopy or flexible sigmoidoscopy):  Excessive amounts of blood in the stool  Significant tenderness or worsening of abdominal pains  Swelling of the abdomen that is new, acute  Fever of 100F or higher   For urgent or emergent issues, a gastroenterologist can be reached at any hour by calling [phone]. Do not use MyChart  messaging for urgent concerns.    DIET:  We do recommend a small meal at first, but then you may proceed to your regular diet.  Drink plenty of fluids but you should avoid alcoholic beverages for 24 hours.  ACTIVITY:  You should plan to take it easy for the rest of today and you should NOT DRIVE or use heavy machinery until tomorrow (because of the sedation medicines used during the test).    FOLLOW UP: Our staff will call the number listed on your records 48-72 hours following your procedure to check on you and address any questions or concerns that you may have regarding the information given to you following your procedure. If we do not reach you, we will leave a message.  We will attempt to reach you two times.  During this call, we will ask if you have developed any symptoms of COVID 19. If you develop any symptoms (ie: fever, flu-like symptoms, shortness of breath, cough etc.) before then, please call [phone].  If you test positive for Covid 19 in the 2 weeks post procedure, please call and report this information to [place].    If any biopsies were taken you will be contacted by phone or by letter within the next 1-3 weeks.  Please call us at [phone] if you have not heard about the biopsies in 3 weeks.    SIGNATURES/CONFIDENTIALITY: You and/or your care partner have signed paperwork which  will be entered into your electronic medical record.  These signatures attest to the fact that that the information above on your After Visit Summary has been reviewed and is understood.  Full responsibility of the confidentiality of this discharge information lies with you and/or your care-partner.    Handouts were given to you on polyps and a high fiber diet. You may resume your current medications today. Await biopsy results. Please call if any questions or concerns.

## 2020-01-19 NOTE — Op Note (Signed)
[facility] Patient Name: Nicole [name] Procedure Date: 01/19/2020 10:17 AM MRN: [id number] Endoscopist: Gatha Mayer , MD Age: 59 Referring MD:  Date of Birth: 1960-11-03 Gender: Female Account #: [id number] Procedure:                Colonoscopy Indications:              Rectal bleeding Medicines:                Propofol per Anesthesia, Monitored Anesthesia Care Procedure:                Pre-Anesthesia Assessment:                           - Prior to the procedure, a History and Physical                            was performed, and patient medications and                            allergies were reviewed. The patient's tolerance of                            previous anesthesia was also reviewed. The risks                            and benefits of the procedure and the sedation                            options and risks were discussed with the patient.                            All questions were answered, and informed consent                            was obtained. Prior Anticoagulants: The patient has                            taken no previous anticoagulant or antiplatelet                            agents. ASA Grade Assessment: II - A patient with                            mild systemic disease. After reviewing the risks                            and benefits, the patient was deemed in                            satisfactory condition to undergo the procedure.                           After obtaining informed consent, the colonoscope  was passed under direct vision. Throughout the                            procedure, the patient's blood pressure, pulse, and                            oxygen saturations were monitored continuously. The                            Colonoscope was introduced through the anus and                            advanced to the the cecum, identified by                            appendiceal orifice and ileocecal  valve. The                            colonoscopy was somewhat difficult due to                            significant looping. Successful completion of the                            procedure was aided by applying abdominal pressure.                            The patient tolerated the procedure well. The                            quality of the bowel preparation was excellent. The                            bowel preparation used was Miralax via split dose                            instruction. The ileocecal valve, appendiceal                            orifice, and rectum were photographed. Scope In: 11:09:13 AM Scope Out: 11:21:40 AM Scope Withdrawal Time: 0 hours 9 minutes 44 seconds  Total Procedure Duration: 0 hours 12 minutes 27 seconds  Findings:                 The perianal and digital rectal examinations were                            normal.                           A diminutive polyp was found in the transverse                            colon. The polyp was sessile. The polyp was removed  with a cold snare. Resection and retrieval were                            complete. Verification of patient identification                            for the specimen was done. Estimated blood loss was                            minimal.                           The exam was otherwise without abnormality on                            direct and retroflexion views. Complications:            No immediate complications. Estimated Blood Loss:     Estimated blood loss was minimal. Impression:               - One diminutive polyp in the transverse colon,                            removed with a cold snare. Resected and retrieved.                           - The examination was otherwise normal on direct                            and retroflexion views. Recommendation:           - Patient has a contact number available for                             emergencies. The signs and symptoms of potential                            delayed complications were discussed with the                            patient. Return to normal activities tomorrow.                            Written discharge instructions were provided to the                            patient.                           - Continue present medications.                           - Repeat colonoscopy is recommended. The                            colonoscopy date will be determined after pathology  results from today's exam become available for                            review.                           - High fiber diet. Gatha Mayer, MD 01/19/2020 11:27:38 AM This report has been signed electronically.

## 2020-01-19 NOTE — Progress Notes (Signed)
Report given to PACU, vss 

## 2020-01-19 NOTE — Progress Notes (Signed)
Called to room to assist during endoscopic procedure.  Patient ID and intended procedure confirmed with present staff. Received instructions for my participation in the procedure from the performing physician.  

## 2020-01-19 NOTE — Progress Notes (Signed)
VS-DT 

## 2020-01-19 NOTE — Progress Notes (Signed)
No problems noted in the recovery room. maw 

## 2020-01-20 ENCOUNTER — Telehealth: Payer: Self-pay | Admitting: Pulmonary Disease

## 2020-01-20 NOTE — Progress Notes (Signed)
Pt notified of results

## 2020-01-20 NOTE — Telephone Encounter (Addendum)
Marshell Garfinkel, MD  01/18/2020 4:24 PM EDT    Labs and chest x ray are normal   Spoke with pt and notified of results per Dr. Vaughan Browner. Pt verbalized understanding and denied any questions.

## 2020-01-21 ENCOUNTER — Telehealth: Payer: Self-pay | Admitting: *Deleted

## 2020-01-21 DIAGNOSIS — M7732 Calcaneal spur, left foot: Secondary | ICD-10-CM | POA: Diagnosis not present

## 2020-01-21 DIAGNOSIS — M7731 Calcaneal spur, right foot: Secondary | ICD-10-CM | POA: Diagnosis not present

## 2020-01-21 DIAGNOSIS — M722 Plantar fascial fibromatosis: Secondary | ICD-10-CM | POA: Diagnosis not present

## 2020-01-21 NOTE — Telephone Encounter (Signed)
  Follow up Call-  Call back number 01/19/2020  Post procedure Call Back phone  # [phone]  Permission to leave phone message Yes  Some recent data might be hidden     Patient questions:  Do you have a fever, pain , or abdominal swelling? No. Pain Score  0 *  Have you tolerated food without any problems? Yes.    Have you been able to return to your normal activities? Yes.    Do you have any questions about your discharge instructions: Diet   No. Medications  No. Follow up visit  No.  Do you have questions or concerns about your Care? No.  Actions: * If pain score is 4 or above: No action needed, pain <4.  1. Have you developed a fever since your procedure? no  2.   Have you had an respiratory symptoms (SOB or cough) since your procedure? no  3.   Have you tested positive for COVID 19 since your procedure no  4.   Have you had any family members/close contacts diagnosed with the COVID 19 since your procedure?  no   If yes to any of these questions please route to Joylene John, RN and Erenest Rasher, RN

## 2020-01-25 ENCOUNTER — Telehealth: Payer: Self-pay | Admitting: Internal Medicine

## 2020-01-25 ENCOUNTER — Encounter: Payer: Self-pay | Admitting: Internal Medicine

## 2020-01-25 NOTE — Telephone Encounter (Signed)
I did not contact the patient.

## 2020-02-11 ENCOUNTER — Ambulatory Visit: Payer: Medicare HMO

## 2020-03-01 ENCOUNTER — Telehealth: Payer: Self-pay | Admitting: Pulmonary Disease

## 2020-03-01 NOTE — Telephone Encounter (Signed)
Assessment:  Chest discomfort Atypical presentation this is likely related to her ongoing marijuana smoking.  She does have mold exposure at home which may cause reactive airway disease Evaluate with CBC differential, IgE and hypersensitivity panel Chest x-ray and PFTs We will trial Symbicort 160 Smoking cessation discussed with patient.  Attempted to call pt but received a response that the call could not be completed as dialed. Will try to call back later.

## 2020-03-01 NOTE — Telephone Encounter (Signed)
Attempted to call patient, phone number is not working, will close encounter and wait for her call.

## 2020-03-02 ENCOUNTER — Encounter: Payer: Self-pay | Admitting: Internal Medicine

## 2020-03-02 ENCOUNTER — Ambulatory Visit (INDEPENDENT_AMBULATORY_CARE_PROVIDER_SITE_OTHER): Payer: Medicare HMO | Admitting: Internal Medicine

## 2020-03-02 ENCOUNTER — Other Ambulatory Visit (HOSPITAL_COMMUNITY)
Admission: RE | Admit: 2020-03-02 | Discharge: 2020-03-02 | Disposition: A | Discharge status: Home / Self Care | Payer: Medicare HMO | Source: Ambulatory Visit | Attending: Internal Medicine | Admitting: Internal Medicine

## 2020-03-02 VITALS — BP 134/98 | HR 79 | Temp 98.5°F | Ht 65.0 in | Wt 176.5 lb

## 2020-03-02 DIAGNOSIS — R7309 Other abnormal glucose: Secondary | ICD-10-CM

## 2020-03-02 DIAGNOSIS — N898 Other specified noninflammatory disorders of vagina: Secondary | ICD-10-CM | POA: Insufficient documentation

## 2020-03-02 DIAGNOSIS — F3160 Bipolar disorder, current episode mixed, unspecified: Secondary | ICD-10-CM | POA: Diagnosis not present

## 2020-03-02 DIAGNOSIS — F4521 Hypochondriasis: Secondary | ICD-10-CM | POA: Insufficient documentation

## 2020-03-02 DIAGNOSIS — R631 Polydipsia: Secondary | ICD-10-CM | POA: Diagnosis not present

## 2020-03-02 HISTORY — DX: Other specified noninflammatory disorders of vagina: N89.8

## 2020-03-02 LAB — GLUCOSE, CAPILLARY: Glucose-Capillary: 86 mg/dL (ref 70–99)

## 2020-03-02 NOTE — Assessment & Plan Note (Addendum)
Nicole [name] is a 59 yo F w/ PMH of OSA, GERD, untreated bipolar disorder presenting with complaint of vaginal discharge. She states she was in her usual state of health until 3-4 days ago when she noticed vaginal irritation with yellow, cottage-cheese discharge. She mentions that she was told by her gynecologist that too much bread can cause vaginal discharge, which she interpreted as meaning that the yeast used to bake bread will manifest as yeast infection in her vagina. She denies any fevers, chills, nausea, vomiting. She has a single sexual partner but does not use contraception and has concerns regarding his fidelity.  A/P Presenting with vaginal discharge. Refused vaginal exam. Vaginal swab provided and self-performed. Description of her discharge is consistent with candidiasis but suspect illness anxiety disorder due to recurrent complaint of variety of illnesses with negative objective findings. Will only treat after diagnosis is confirmed - F/u wet prep

## 2020-03-02 NOTE — Assessment & Plan Note (Addendum)
Mentions hx of feeling more thirstier than usual, accompanied by polyuria. Denies any polyphagia. Concerned about diabetes. No recent hgb a1c to screen for diabetes. Prior random elevated blood glucose at 140. Will get hgb a1c to further assess.

## 2020-03-02 NOTE — Assessment & Plan Note (Signed)
Nicole [name] is a 59 yo F w/ PMH of untreated bipolar disorder, GERD, OSA presenting w/ concerns for vaginal discharge. Since establishing care with me, I have noticed a pattern of anxiety and concern over serious illness without somatic symptoms. Also on further history taking, expresses lack of sleep, pressured speech and high risk sexual behavior which does meet criteria for manic episodes. Discussed again regarding re-connecting with [facility] for treatment of her bipolar disorder. She declined.

## 2020-03-02 NOTE — Patient Instructions (Signed)
Thank you for allowing [place] to provide your care today. Today we discussed your vaginal discharge    I have ordered yeast and blood sugar labs for you. I will call if any are abnormal.    Today we made no changes to your medications.    Please follow-up in 3 months.    Should you have any questions or concerns please call the internal medicine clinic at [phone].     Vaginal Yeast Infection, Adult  Vaginal yeast infection is a condition that causes vaginal discharge as well as soreness, swelling, and redness (inflammation) of the vagina. This is a common condition. Some women get this infection frequently. What are the causes? This condition is caused by a change in the normal balance of the yeast (candida) and bacteria that live in the vagina. This change causes an overgrowth of yeast, which causes the inflammation. What increases the risk? The condition is more likely to develop in women who:  Take antibiotic medicines.  Have diabetes.  Take birth control pills.  Are pregnant.  Douche often.  Have a weak body defense system (immune system).  Have been taking steroid medicines for a long time.  Frequently wear tight clothing. What are the signs or symptoms? Symptoms of this condition include:  White, thick, creamy vaginal discharge.  Swelling, itching, redness, and irritation of the vagina. The lips of the vagina (vulva) may be affected as well.  Pain or a burning feeling while urinating.  Pain during sex. How is this diagnosed? This condition is diagnosed based on:  Your medical history.  A physical exam.  A pelvic exam. Your health care provider will examine a sample of your vaginal discharge under a microscope. Your health care provider may send this sample for testing to confirm the diagnosis. How is this treated? This condition is treated with medicine. Medicines may be over-the-counter or prescription. You may be told to use one or more of the  following:  Medicine that is taken by mouth (orally).  Medicine that is applied as a cream (topically).  Medicine that is inserted directly into the vagina (suppository). Follow these instructions at home:  Lifestyle  Do not have sex until your health care provider approves. Tell your sex partner that you have a yeast infection. That person should go to his or her health care provider and ask if they should also be treated.  Do not wear tight clothes, such as pantyhose or tight pants.  Wear breathable cotton underwear. General instructions  Take or apply over-the-counter and prescription medicines only as told by your health care provider.  Eat more yogurt. This may help to keep your yeast infection from returning.  Do not use tampons until your health care provider approves.  Try taking a sitz bath to help with discomfort. This is a warm water bath that is taken while you are sitting down. The water should only come up to your hips and should cover your buttocks. Do this 3-4 times per day or as told by your health care provider.  Do not douche.  If you have diabetes, keep your blood sugar levels under control.  Keep all follow-up visits as told by your health care provider. This is important. Contact a health care provider if:  You have a fever.  Your symptoms go away and then return.  Your symptoms do not get better with treatment.  Your symptoms get worse.  You have new symptoms.  You develop blisters in or around your vagina.  You have blood coming from your vagina and it is not your menstrual period.  You develop pain in your abdomen. Summary  Vaginal yeast infection is a condition that causes discharge as well as soreness, swelling, and redness (inflammation) of the vagina.  This condition is treated with medicine. Medicines may be over-the-counter or prescription.  Take or apply over-the-counter and prescription medicines only as told by your health care  provider.  Do not douche. Do not have sex or use tampons until your health care provider approves.  Contact a health care provider if your symptoms do not get better with treatment or your symptoms go away and then return. This information is not intended to replace advice given to you by your health care provider. Make sure you discuss any questions you have with your health care provider. Document Revised: 03/06/2019 Document Reviewed: 12/23/2017 Elsevier Patient Education  [place].

## 2020-03-02 NOTE — Telephone Encounter (Signed)
Error

## 2020-03-02 NOTE — Progress Notes (Signed)
   CC: Vaginal discharge  HPI: [name] is a 59 y.o. with PMH listed below presenting with complaint of vaginal discharge. Please see problem based assessment and plan for further details.  Past Medical History:  Diagnosis Date  . Anemia   . Anxiety   . Depression    bipolar  . GERD (gastroesophageal reflux disease)   . History of migraine headaches    headaches reported as migraines  . Hypercholesterolemia   . Hypotension   . Obesity   . Palpitations    negative cardiac w/u per Dr. Verl Blalock 2/09  . Reflux esophagitis   . Sleep apnea    No CPAP   Review of Systems: Review of Systems  Constitutional: Negative for chills, fever and malaise/fatigue.  Eyes: Negative for blurred vision.  Respiratory: Negative for shortness of breath.   Cardiovascular: Negative for chest pain.  Gastrointestinal: Negative for constipation, diarrhea, nausea and vomiting.  Genitourinary: Negative for dysuria, frequency and urgency.  Neurological: Negative for dizziness.  Endo/Heme/Allergies: Positive for polydipsia.  Psychiatric/Behavioral: Negative for depression, hallucinations and suicidal ideas. The patient is nervous/anxious.   All other systems reviewed and are negative.   Physical Exam: Vitals:   03/02/20 1348  BP: (!) 134/98  Pulse: 79  Temp: 98.5 F (36.9 C)  TempSrc: Oral  SpO2: 100%  Weight: 176 lb 8 oz (80.1 kg)  Height: 5\' 5"  (1.651 m)   Gen: Well-developed, well nourished, NAD HEENT: NCAT head, hearing intact CV: RRR, S1, S2 normal Pulm: CTAB, No rales, no wheezes Extm: ROM intact, Peripheral pulses intact Skin: Dry, Warm, normal turgor GU: Declined vaginal exam Psych: Denies auditory/visula/tactile hallucinations, pressured speech, no grandiose delusions  Assessment & Plan:   Vaginal discharge [name] is a 59 yo F w/ PMH of OSA, GERD, untreated bipolar disorder presenting with complaint of vaginal discharge. She states she was in her usual state of health until 3-4  days ago when she noticed vaginal irritation with yellow, cottage-cheese discharge. She mentions that she was told by her gynecologist that too much bread can cause vaginal discharge, which she interpreted as meaning that the yeast used to bake bread will manifest as yeast infection in her vagina. She denies any fevers, chills, nausea, vomiting. She has a single sexual partner but does not use contraception and has concerns regarding his fidelity.  A/P Presenting with vaginal discharge. Refused vaginal exam. Vaginal swab provided and self-performed. Description of her discharge is consistent with candidiasis but suspect illness anxiety disorder due to recurrent complaint of variety of illnesses with negative objective findings. Will only treat after diagnosis is confirmed - F/u wet prep  Bipolar disorder ([place]) [name] is a 59 yo F w/ PMH of untreated bipolar disorder, GERD, OSA presenting w/ concerns for vaginal discharge. Since establishing care with me, I have noticed a pattern of anxiety and concern over serious illness without somatic symptoms. Also on further history taking, expresses lack of sleep, pressured speech and high risk sexual behavior which does meet criteria for manic episodes. Discussed again regarding re-connecting with [facility] for treatment of her bipolar disorder. She declined.  Polydipsia Mentions hx of feeling more thirstier than usual, accompanied by polyuria. Denies any polyphagia. Concerned about diabetes. No recent hgb a1c to screen for diabetes but prior bmp with wnl random glucose. Will get hgb a1c to further assess.   Patient discussed with Dr. Evette Doffing  -Gilberto Better, [place] [facility] Pager: [phone]

## 2020-03-02 NOTE — Assessment & Plan Note (Deleted)
Nicole [name] is a 59 yo F w/ PMH of untreated bipolar disorder, GERD, OSA presenting w/ concerns for vaginal discharge. Since establishing care with me, I have noticed a pattern of anxiety and concern over serious illness without somatic symptoms.

## 2020-03-03 ENCOUNTER — Telehealth: Payer: Self-pay

## 2020-03-03 LAB — CERVICOVAGINAL ANCILLARY ONLY
Bacterial Vaginitis (gardnerella): NEGATIVE
Candida Glabrata: NEGATIVE
Candida Vaginitis: NEGATIVE
Chlamydia: NEGATIVE
Comment: NEGATIVE
Comment: NEGATIVE
Comment: NEGATIVE
Comment: NEGATIVE
Comment: NEGATIVE
Comment: NORMAL
Neisseria Gonorrhea: NEGATIVE
Trichomonas: NEGATIVE

## 2020-03-03 LAB — POCT GLYCOSYLATED HEMOGLOBIN (HGB A1C): Hemoglobin A1C: 5.7 % — AB (ref 4.0–5.6)

## 2020-03-03 NOTE — Progress Notes (Signed)
Internal Medicine Clinic Attending  Case discussed with Dr. Lee  At the time of the visit.  We reviewed the resident's history and exam and pertinent patient test results.  I agree with the assessment, diagnosis, and plan of care documented in the resident's note.    

## 2020-03-03 NOTE — Addendum Note (Signed)
Addended by: Marcelino Duster on: 03/03/2020 04:07 PM   Modules accepted: Orders

## 2020-03-03 NOTE — Telephone Encounter (Signed)
Requesting test results, please call pt back.  

## 2020-03-03 NOTE — Telephone Encounter (Signed)
Pt requesting A1c results. A1c 5.7. Relayed to patient and advised wnl. She expresses understanding.

## 2020-03-08 ENCOUNTER — Other Ambulatory Visit: Payer: Self-pay | Admitting: *Deleted

## 2020-03-24 DIAGNOSIS — B373 Candidiasis of vulva and vagina: Secondary | ICD-10-CM | POA: Diagnosis not present

## 2020-03-24 DIAGNOSIS — B353 Tinea pedis: Secondary | ICD-10-CM | POA: Diagnosis not present

## 2020-03-31 DIAGNOSIS — L600 Ingrowing nail: Secondary | ICD-10-CM | POA: Diagnosis not present

## 2020-03-31 DIAGNOSIS — M7989 Other specified soft tissue disorders: Secondary | ICD-10-CM | POA: Diagnosis not present

## 2020-03-31 DIAGNOSIS — B351 Tinea unguium: Secondary | ICD-10-CM | POA: Diagnosis not present

## 2020-04-05 ENCOUNTER — Telehealth: Payer: Self-pay | Admitting: Internal Medicine

## 2020-04-05 NOTE — Telephone Encounter (Signed)
Returned call to patient. States she will not get Covid vaccines but is requesting a low dose of hydroxychloroquine. Explained this is not a treatment for, or a prevention of Covid. Patient began talking about how the vaccines are killing people and insisted her request be passed on to PCP. Will forward to [place] to advise. Hubbard Hartshorn, BSN, RN-BC

## 2020-04-05 NOTE — Telephone Encounter (Signed)
Nicole [name] was called by me at 1620 on 04/05/2020. She was requesting a shot of hydroxychloroquine. I then stated that we do not treat COVID with hydroxychloroquine and we do not also prescribe medications without a medical indication. The patient then asked what we give hydroxychloroquine for. I preceded to relay the information that hydroxchloroquine is sometimes used to treat malaria, parasitic infections and lupus. Mrs. 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 I then recommended she schedule an appointment with the clinic to have this evaluated. She agreed to the plan.

## 2020-04-05 NOTE — Telephone Encounter (Signed)
Pt requesting a call back from a nurse.

## 2020-04-05 NOTE — Telephone Encounter (Signed)
I also reiterated to Nicole [name] that even if she has any of the medical indications listed that that does not guarantee she will be prescribed or given hydroxychloroquine. She acknowledges understanding.

## 2020-04-08 ENCOUNTER — Encounter: Payer: Medicare HMO | Admitting: Student

## 2020-04-12 ENCOUNTER — Ambulatory Visit: Payer: Medicare HMO | Admitting: Pulmonary Disease

## 2020-04-12 ENCOUNTER — Ambulatory Visit: Payer: Medicare HMO | Admitting: Obstetrics and Gynecology

## 2020-04-14 DIAGNOSIS — R05 Cough: Secondary | ICD-10-CM | POA: Diagnosis not present

## 2020-04-14 DIAGNOSIS — R0602 Shortness of breath: Secondary | ICD-10-CM | POA: Diagnosis not present

## 2020-04-14 DIAGNOSIS — Z20822 Contact with and (suspected) exposure to covid-19: Secondary | ICD-10-CM | POA: Diagnosis not present

## 2020-04-22 DIAGNOSIS — R8271 Bacteriuria: Secondary | ICD-10-CM | POA: Diagnosis not present

## 2020-04-22 DIAGNOSIS — N3000 Acute cystitis without hematuria: Secondary | ICD-10-CM | POA: Diagnosis not present

## 2020-04-26 ENCOUNTER — Encounter: Payer: Medicare HMO | Admitting: Internal Medicine

## 2020-04-27 DIAGNOSIS — H2513 Age-related nuclear cataract, bilateral: Secondary | ICD-10-CM | POA: Diagnosis not present

## 2020-04-27 DIAGNOSIS — H40013 Open angle with borderline findings, low risk, bilateral: Secondary | ICD-10-CM | POA: Diagnosis not present

## 2020-05-08 DIAGNOSIS — Z20822 Contact with and (suspected) exposure to covid-19: Secondary | ICD-10-CM | POA: Diagnosis not present

## 2020-05-08 DIAGNOSIS — Z1152 Encounter for screening for COVID-19: Secondary | ICD-10-CM | POA: Diagnosis not present

## 2020-05-11 ENCOUNTER — Ambulatory Visit: Payer: Medicare HMO | Admitting: Acute Care

## 2020-05-11 ENCOUNTER — Telehealth: Payer: Self-pay | Admitting: Pulmonary Disease

## 2020-05-11 NOTE — Telephone Encounter (Signed)
Spoke with the pt  She is calling to schedule PFT and ov with PM  I scheduled her appts for 06/21/20  Before I could get her covid test scheduled she had to get off the phone and said she would call back  When she calls back needs to be informed her covid test is set for 06/18/20 at 10:30 am at [address]

## 2020-05-12 NOTE — Telephone Encounter (Signed)
Called and spoke to pt. Informed her of all of the upcoming appt information. Pt verbalized understanding and denied any further questions or concerns at this time.

## 2020-05-30 ENCOUNTER — Encounter: Payer: Self-pay | Admitting: Obstetrics and Gynecology

## 2020-05-30 ENCOUNTER — Ambulatory Visit: Payer: Medicare HMO | Admitting: Obstetrics and Gynecology

## 2020-05-31 NOTE — Progress Notes (Signed)
Patient did not keep her GYN appointment for 05/30/2020.  Durene Romans MD Attending Center for [employer] farm [employer])

## 2020-06-18 ENCOUNTER — Other Ambulatory Visit (HOSPITAL_COMMUNITY): Payer: Medicare HMO

## 2020-06-21 ENCOUNTER — Ambulatory Visit: Payer: Medicare HMO | Admitting: Pulmonary Disease

## 2020-06-24 ENCOUNTER — Other Ambulatory Visit (HOSPITAL_COMMUNITY)
Admission: RE | Admit: 2020-06-24 | Discharge: 2020-06-24 | Disposition: A | Discharge status: Home / Self Care | Payer: Medicare HMO | Source: Ambulatory Visit | Attending: Obstetrics and Gynecology | Admitting: Obstetrics and Gynecology

## 2020-06-24 ENCOUNTER — Encounter: Payer: Self-pay | Admitting: Obstetrics and Gynecology

## 2020-06-24 ENCOUNTER — Other Ambulatory Visit: Payer: Self-pay

## 2020-06-24 ENCOUNTER — Ambulatory Visit (INDEPENDENT_AMBULATORY_CARE_PROVIDER_SITE_OTHER): Payer: Medicare HMO | Admitting: Obstetrics and Gynecology

## 2020-06-24 VITALS — Wt 174.2 lb

## 2020-06-24 DIAGNOSIS — B373 Candidiasis of vulva and vagina: Secondary | ICD-10-CM | POA: Diagnosis not present

## 2020-06-24 DIAGNOSIS — Z01411 Encounter for gynecological examination (general) (routine) with abnormal findings: Secondary | ICD-10-CM | POA: Insufficient documentation

## 2020-06-24 DIAGNOSIS — Z113 Encounter for screening for infections with a predominantly sexual mode of transmission: Secondary | ICD-10-CM | POA: Diagnosis not present

## 2020-06-24 DIAGNOSIS — N898 Other specified noninflammatory disorders of vagina: Secondary | ICD-10-CM | POA: Diagnosis not present

## 2020-06-24 DIAGNOSIS — N6310 Unspecified lump in the right breast, unspecified quadrant: Secondary | ICD-10-CM

## 2020-06-24 DIAGNOSIS — B3731 Acute candidiasis of vulva and vagina: Secondary | ICD-10-CM

## 2020-06-24 DIAGNOSIS — Z1151 Encounter for screening for human papillomavirus (HPV): Secondary | ICD-10-CM | POA: Diagnosis not present

## 2020-06-24 DIAGNOSIS — Z124 Encounter for screening for malignant neoplasm of cervix: Secondary | ICD-10-CM | POA: Insufficient documentation

## 2020-06-24 NOTE — Progress Notes (Signed)
Obstetrics and Gynecology Annual Patient Evaluation  Appointment Date: 06/24/2020  OBGYN Clinic: [facility]  Primary Care Provider: [name]  Referring Provider: [name], MD  Chief Complaint:  Chief Complaint  Patient presents with  . Gynecologic Exam    History of Present Illness: Nicole [name] is a 59 y.o. African-American [id number] (LMP: early 70s), seen for the above chief complaint.   Vaginal odor: chronic. Has tried meds in the remote past that helped but can't remember what exactly. She notices it with sex and female and ejaculation; they do not use barrier protection, lubrication. She does do douches, apple cider vinegar and bleach washes  Right breast lesion: chronic. Pre-dates her mammogram she had earlier this year. She has never had any surgery or aspiration of it. She states she now feels that there is a bump underneath it. No other breast s/s.   Review of Systems: Pertinent items noted in HPI and remainder of comprehensive ROS otherwise negative.    Patient Active Problem List   Diagnosis Date Noted  . Vaginal discharge 03/02/2020  . Other abnormal glucose 03/02/2020  . History of hyperlipidemia 11/10/2019  . Overweight 09/23/2019  . History of recurrent UTI (urinary tract infection) 11/12/2017  . Marijuana use 11/12/2017  . OSA (obstructive sleep apnea) 12/27/2015  . Gastroesophageal reflux disease 12/16/2009  . Bipolar disorder ([place]) 04/14/2007    Past Medical History:  Past Medical History:  Diagnosis Date  . Anemia   . Anxiety   . Depression    bipolar  . GERD (gastroesophageal reflux disease)   . History of migraine headaches    headaches reported as migraines  . Hypercholesterolemia   . Hypotension   . Obesity   . Palpitations    negative cardiac w/u per Dr. Verl Blalock 2/09  . Reflux esophagitis   . Sleep apnea    No CPAP    Past Surgical History:  Past Surgical History:  Procedure Laterality Date   . COLONOSCOPY    . ECTOPIC PREGNANCY SURGERY    . EXPLORATORY LAPAROTOMY  2000   x2 (Dr. Kendall Flack)  . HERPES SIMPLEX VIRUS DFA    . TUBAL LIGATION  1987    Past Obstetrical History:  OB History  Gravida Para Term Preterm AB Living  4 3 3  0 1 3  SAB TAB Ectopic Multiple Live Births  0 0 1 0 3    # Outcome Date GA Lbr Len/2nd Weight Sex Delivery Anes PTL Lv  4 Ectopic           3 Term           2 Term           1 Term             Past Gynecological History: As per HPI. History of Pap Smear(s): Yes.   Last pap 2018, which was negative and hpv negative History of HRT use: No.   Social History:  Social History   Socioeconomic History  . Marital status: Single    Spouse name: Not on file  . Number of children: 3  . Years of education: Not on file  . Highest education level: Not on file  Occupational History  . Occupation: Agricultural consultant: UNEMPLOYED    Comment: Unemployed, on disability  Tobacco Use  . Smoking status: Former Smoker    Packs/day: 0.25    Years: 36.00    Pack  years: 9.00    Types: Cigarettes    Quit date: 12/30/2019    Years since quitting: 0.4  . Smokeless tobacco: Never Used  Vaping Use  . Vaping Use: Never used  Substance and Sexual Activity  . Alcohol use: Yes    Alcohol/week: 0.0 standard drinks    Comment: Rare glass of wine  . Drug use: Yes    Types: Marijuana    Comment: last use 27 May 21  . Sexual activity: Never  Other Topics Concern  . Not on file  Social History Narrative   Patient lives at home alone.   Caffeine Use: none         Epworth Sleepiness Scale = 12 (as of 09/13/2015)   Social Determinants of Health   Financial Resource Strain:   . Difficulty of Paying Living Expenses: Not on file  Food Insecurity: Food Insecurity Present  . Worried About Charity fundraiser in the Last Year: Sometimes true  . Ran Out of Food in the Last Year: Sometimes true  Transportation Needs: Unmet Transportation  Needs  . Lack of Transportation (Medical): Yes  . Lack of Transportation (Non-Medical): Yes  Physical Activity:   . Days of Exercise per Week: Not on file  . Minutes of Exercise per Session: Not on file  Stress:   . Feeling of Stress : Not on file  Social Connections:   . Frequency of Communication with Friends and Family: Not on file  . Frequency of Social Gatherings with Friends and Family: Not on file  . Attends Religious Services: Not on file  . Active Member of Clubs or Organizations: Not on file  . Attends Archivist Meetings: Not on file  . Marital Status: Not on file  Intimate Partner Violence:   . Fear of Current or Ex-Partner: Not on file  . Emotionally Abused: Not on file  . Physically Abused: Not on file  . Sexually Abused: Not on file    Family History:  Family History  Problem Relation Age of Onset  . Hypertension Mother   . Diabetes Mother   . Heart disease Mother   . Thyroid disease Mother   . Heart attack Mother   . Glaucoma Mother   . Pancreatic cancer Other        family history  . Heart attack Maternal Grandmother   . Heart attack Maternal Grandfather   . Pancreatic cancer Maternal Uncle   . Diabetes Sister   . Hypertension Sister   . Colon cancer Neg Hx     Health Maintenance:  Mammogram(s): April 2021  Medications Nicole [name] had no medications administered during this visit. Current Outpatient Medications  Medication Sig Dispense Refill  . aspirin 325 MG EC tablet Take 325 mg by mouth daily. Takes 1/2 tab prn headaches    . VITAMIN D PO Take by mouth.    Marland Kitchen VITAMIN E PO Take by mouth.    . budesonide-formoterol (SYMBICORT) 160-4.5 MCG/ACT inhaler Inhale 2 puffs into the lungs 2 (two) times daily. (Patient not taking: Reported on 01/19/2020) 10.2 g 5  . Na Sulfate-K Sulfate-Mg Sulf 17.5-3.13-1.6 GM/177ML SOLN Follow instructions from the doctor's office (Patient not taking: Reported on 06/24/2020) 177 mL 0   No current  facility-administered medications for this visit.    Allergies Other   Physical Exam:  Wt 174 lb 3.2 oz (79 kg)   LMP 03/03/2016 (Approximate)   BMI 28.99 kg/m  Body mass index is 28.99 kg/m. General  appearance: Well nourished, well developed female in no acute distress.  Neck:  Supple, normal appearance, and no thyromegaly  Cardiovascular: normal s1 and s2.  No murmurs, rubs or gallops. Respiratory:  Clear to auscultation bilateral. Normal respiratory effort Abdomen: positive bowel sounds and no masses, hernias; diffusely non tender to palpation, non distended Breasts: right breast normal for inspection and palpation except small area approx 5-6cm at the 10 o'clock position from the nipple there is pinpoint spot that appears to be a folliculitis (slightly pink to normal skin color), nttp, no discharge, no massesleft breast normal on inspection and palpation Neuro/Psych:  Normal mood and affect.  Skin:  Warm and dry.  Lymphatic:  No inguinal lymphadenopathy.   Pelvic exam: is not limited by body habitus EGBUS: within normal limits Vagina: within normal limits and with no blood in the vault. +white cottage cheese like discharge in the vault Cervix: normal appearing cervix without tenderness, discharge or lesions.  Uterus:  nonenlarged and non tender Adnexa:  normal adnexa and no mass, fullness, tenderness Rectovaginal: deferred  Laboratory: none  Radiology: none  Assessment: pt stable  Plan:  1. Screening examination for STD (sexually transmitted disease) Pt would like screening - RPR - Hepatitis B Surface AntiGEN - Hepatitis C Antibody - HIV Antibody (routine testing w rflx)  2. Vaginal discharge F/u swabs. I recommended to stop douching either OTCs or homemade remedies. I also told her that semen can also cause BV and vaginal odor/discharge. Recommend barrier contraception or if feels safe to go w/o to do coitus interruptus  - Cytology - PAP( [place]) - NuSwab  Vaginitis Plus (VG+)  3. Vaginal odor - Cytology - PAP( [place]) - NuSwab Vaginitis Plus (VG+)  4. Breast mass, right Appears benign like a folliculitis but since chronic issue, will refer to breast surgery - Ambulatory referral to General Surgery  5. Vulvovaginal candidiasis See above.   RTC PRN  Durene Romans MD Attending Center for [employer] farm manager)

## 2020-06-25 LAB — RPR: RPR Ser Ql: NONREACTIVE

## 2020-06-25 LAB — HEPATITIS C ANTIBODY: Hep C Virus Ab: <0.1 s/co ratio (ref 0.0–0.9)

## 2020-06-25 LAB — HEPATITIS B SURFACE ANTIGEN: Hepatitis B Surface Ag: NEGATIVE

## 2020-06-25 LAB — HIV ANTIBODY (ROUTINE TESTING W REFLEX): HIV Screen 4th Generation wRfx: NONREACTIVE

## 2020-06-27 ENCOUNTER — Telehealth: Payer: Self-pay

## 2020-06-27 LAB — CYTOLOGY - PAP
Comment: NEGATIVE
Diagnosis: NEGATIVE
High risk HPV: NEGATIVE

## 2020-06-27 LAB — NUSWAB VAGINITIS PLUS (VG+)
Candida albicans, NAA: NEGATIVE
Candida glabrata, NAA: NEGATIVE
Chlamydia trachomatis, NAA: NEGATIVE
Neisseria gonorrhoeae, NAA: NEGATIVE
Trich vag by NAA: NEGATIVE

## 2020-06-27 NOTE — Telephone Encounter (Addendum)
External referral ordered by Ilda Basset, MD for persistent right breast lesion eval and management. [facility] who states pt will be contacted by new patient coordinator once needed pt info is received. [place] office notified to send appropriate records.

## 2020-07-08 DIAGNOSIS — N912 Amenorrhea, unspecified: Secondary | ICD-10-CM | POA: Diagnosis not present

## 2020-07-08 DIAGNOSIS — R300 Dysuria: Secondary | ICD-10-CM | POA: Diagnosis not present

## 2020-07-08 DIAGNOSIS — N390 Urinary tract infection, site not specified: Secondary | ICD-10-CM | POA: Diagnosis not present

## 2020-07-08 DIAGNOSIS — F17210 Nicotine dependence, cigarettes, uncomplicated: Secondary | ICD-10-CM | POA: Diagnosis not present

## 2020-07-08 DIAGNOSIS — R0602 Shortness of breath: Secondary | ICD-10-CM | POA: Diagnosis not present

## 2020-07-08 DIAGNOSIS — R102 Pelvic and perineal pain: Secondary | ICD-10-CM | POA: Diagnosis not present

## 2020-07-21 ENCOUNTER — Encounter: Payer: Self-pay | Admitting: Obstetrics and Gynecology

## 2020-07-21 ENCOUNTER — Ambulatory Visit (INDEPENDENT_AMBULATORY_CARE_PROVIDER_SITE_OTHER): Payer: Medicare HMO | Admitting: Obstetrics and Gynecology

## 2020-07-21 ENCOUNTER — Other Ambulatory Visit: Payer: Self-pay

## 2020-07-21 VITALS — BP 111/77 | HR 103 | Wt 170.0 lb

## 2020-07-21 DIAGNOSIS — R300 Dysuria: Secondary | ICD-10-CM

## 2020-07-21 DIAGNOSIS — N930 Postcoital and contact bleeding: Secondary | ICD-10-CM

## 2020-07-21 NOTE — Progress Notes (Signed)
Obstetrics and Gynecology Visit Return Patient Evaluation  Appointment Date: 07/21/2020  Primary Care Provider: Lee, [place] for [facility]  Chief Complaint: post coital bleeding. Dysuria.   History of Present Illness:  Nicole [name] is a 59 y.o. with above CC.   She states that she had vigorous sex with her partner about a week to week and half ago and had post coital bleeding that she noticed when she wiped in the bathroom. It went away after about a day. Partner is not new and patient had normal exam, pap smear and swab testing on 11/5 and everything was negative.  Patient just wanting to make sure everything is okay  Pt had some dysuria and PCP u-dip showed UTI so she finished keflex but still having some s/s.   Review of Systems:  as noted in the History of Present Illness.  Patient Active Problem List   Diagnosis Date Noted  . Vaginal discharge 03/02/2020  . Other abnormal glucose 03/02/2020  . History of hyperlipidemia 11/10/2019  . Overweight 09/23/2019  . History of recurrent UTI (urinary tract infection) 11/12/2017  . Marijuana use 11/12/2017  . OSA (obstructive sleep apnea) 12/27/2015  . Gastroesophageal reflux disease 12/16/2009  . Bipolar disorder ([place]) 04/14/2007   Medications:  Nicole [name] had no medications administered during this visit. Current Outpatient Medications  Medication Sig Dispense Refill  . aspirin 325 MG EC tablet Take 325 mg by mouth daily. Takes 1/2 tab prn headaches    . Cephalexin 500 MG tablet     . VITAMIN D PO Take by mouth.     Marland Kitchen VITAMIN E PO Take by mouth.    . budesonide-formoterol (SYMBICORT) 160-4.5 MCG/ACT inhaler Inhale 2 puffs into the lungs 2 (two) times daily. (Patient not taking: Reported on 01/19/2020) 10.2 g 5  . Na Sulfate-K Sulfate-Mg Sulf 17.5-3.13-1.6 GM/177ML SOLN Follow instructions from the doctor's office (Patient not taking: Reported on 06/24/2020) 177 mL 0   No current  facility-administered medications for this visit.    Allergies: is allergic to other.  Physical Exam:  BP 111/77   Pulse (!) 103   Wt 170 lb (77.1 kg)   LMP 03/03/2016 (Approximate)   BMI 28.29 kg/m  Body mass index is 28.29 kg/m. General appearance: Well nourished, well developed female in no acute distress.  Abdomen: diffusely non tender to palpation, non distended, and no masses, hernias Neuro/Psych:  Normal mood and affect.    Pelvic exam:  EGBUS-normal Vagina-normal Cervix: nttp. There is a nearly healed laceration at 12 o'clock. approximately 41mm in size going vertically.    Assessment: cervical lac after sex  Plan:  1. Dysuria - Urinalysis, Routine w reflex microscopic - Urine Culture  2. Post coital bleeding Recommend another week of pelvic rest and using plenty of lubrication; first time this has happened but if becomes recurrent issue, repeat in office eval  Pt declines STI swab  RTC: PRN  Durene Romans MD Attending Center for [facility])

## 2020-07-22 LAB — URINALYSIS, ROUTINE W REFLEX MICROSCOPIC
Bilirubin, UA: NEGATIVE
Glucose, UA: NEGATIVE
Ketones, UA: NEGATIVE
Leukocytes,UA: NEGATIVE
Nitrite, UA: NEGATIVE
Protein,UA: NEGATIVE
RBC, UA: NEGATIVE
Specific Gravity, UA: 1.022 (ref 1.005–1.030)
Urobilinogen, Ur: 0.2 mg/dL (ref 0.2–1.0)
pH, UA: 6 (ref 5.0–7.5)

## 2020-07-22 LAB — URINE CULTURE: Organism ID, Bacteria: NO GROWTH

## 2020-07-25 ENCOUNTER — Other Ambulatory Visit (HOSPITAL_COMMUNITY): Payer: Medicare HMO

## 2020-07-25 ENCOUNTER — Telehealth: Payer: Self-pay

## 2020-07-25 NOTE — Telephone Encounter (Signed)
Called Pt to advise that Urine Culture came back negative. Pt. Verbalized understanding.

## 2020-07-25 NOTE — Telephone Encounter (Signed)
-----   Message from Aletha Halim, MD sent at 07/24/2020  1:20 AM EST ----- Can you let her know that her urine studies came back all negative? thanks

## 2020-07-28 ENCOUNTER — Ambulatory Visit: Payer: Medicare HMO | Admitting: Pulmonary Disease

## 2020-08-18 DIAGNOSIS — M545 Low back pain, unspecified: Secondary | ICD-10-CM | POA: Diagnosis not present

## 2020-08-20 DIAGNOSIS — L940 Localized scleroderma [morphea]: Secondary | ICD-10-CM

## 2020-08-20 HISTORY — DX: Localized scleroderma (morphea): L94.0

## 2020-08-24 ENCOUNTER — Encounter: Payer: Self-pay | Admitting: Student

## 2020-08-24 DIAGNOSIS — M545 Low back pain, unspecified: Secondary | ICD-10-CM | POA: Insufficient documentation

## 2020-08-25 ENCOUNTER — Encounter: Payer: Self-pay | Admitting: Student

## 2020-08-25 ENCOUNTER — Other Ambulatory Visit: Payer: Self-pay

## 2020-08-25 ENCOUNTER — Other Ambulatory Visit (HOSPITAL_COMMUNITY)
Admission: RE | Admit: 2020-08-25 | Discharge: 2020-08-25 | Disposition: A | Discharge status: Home / Self Care | Payer: Medicare HMO | Source: Ambulatory Visit | Attending: Internal Medicine | Admitting: Internal Medicine

## 2020-08-25 ENCOUNTER — Ambulatory Visit (INDEPENDENT_AMBULATORY_CARE_PROVIDER_SITE_OTHER): Payer: Medicare HMO | Admitting: Student

## 2020-08-25 VITALS — BP 131/78 | HR 69

## 2020-08-25 DIAGNOSIS — R319 Hematuria, unspecified: Secondary | ICD-10-CM

## 2020-08-25 DIAGNOSIS — F3160 Bipolar disorder, current episode mixed, unspecified: Secondary | ICD-10-CM

## 2020-08-25 DIAGNOSIS — Z8639 Personal history of other endocrine, nutritional and metabolic disease: Secondary | ICD-10-CM

## 2020-08-25 DIAGNOSIS — N898 Other specified noninflammatory disorders of vagina: Secondary | ICD-10-CM | POA: Insufficient documentation

## 2020-08-25 DIAGNOSIS — Z87440 Personal history of urinary (tract) infections: Secondary | ICD-10-CM | POA: Diagnosis not present

## 2020-08-25 DIAGNOSIS — F1290 Cannabis use, unspecified, uncomplicated: Secondary | ICD-10-CM | POA: Diagnosis not present

## 2020-08-25 DIAGNOSIS — E782 Mixed hyperlipidemia: Secondary | ICD-10-CM | POA: Diagnosis not present

## 2020-08-25 LAB — POCT URINALYSIS DIPSTICK
Bilirubin, UA: NEGATIVE
Blood, UA: NEGATIVE
Glucose, UA: NEGATIVE
Ketones, UA: NEGATIVE
Leukocytes, UA: NEGATIVE
Nitrite, UA: NEGATIVE
Protein, UA: NEGATIVE
Spec Grav, UA: 1.025 (ref 1.010–1.025)
Urobilinogen, UA: 0.2 E.U./dL
pH, UA: 7.5 (ref 5.0–8.0)

## 2020-08-25 NOTE — Assessment & Plan Note (Signed)
A: Patient has historically elevated total cholesterol and LDL. ASCVD risk is 5.2% currently. Patient requests re-evaluation of her cholesterol given last check was in March and she endorses significant dietary changes since that time. Not on a statin or cholesterol medication.   P:  - Repeat lipid panel

## 2020-08-25 NOTE — Assessment & Plan Note (Signed)
A: Patient endorses history of bipolar disorder, currently more depressed than manic, stating she is able to function fully at home. She states she is followed by Lenoria Chime and requests [facility] paperwork which was last filed last year. Denies SI/HI. States she is not interested in taking medication at this time.   P:  - Continue to monitor symptoms without medication  - Signed FL2 paperwork as requested

## 2020-08-25 NOTE — Progress Notes (Signed)
   CC: Hematuria   HPI:  Nicole [name] is a 61 y.o. lady w/ PMHx untreated bipolar disorder and recent cervical laceration and diagnosis of UTI about 2 months ago, presenting with chief complaint of chronic hematuria. Also requesting [facility] FL2 paperwork. Please see problem-based assessment/plan for further details.   Past Medical History:  Diagnosis Date  . Anemia   . Anxiety   . Depression    bipolar  . GERD (gastroesophageal reflux disease)   . History of migraine headaches    headaches reported as migraines  . Hypercholesterolemia   . Hypotension   . Obesity   . Palpitations    negative cardiac w/u per Dr. Daleen Squibb 2/09  . Reflux esophagitis   . Sleep apnea    No CPAP   Review of Systems:  All others negative except as noted in assessment/plan.   Physical Exam:  Vitals:   08/25/20 0849  BP: 131/78  Pulse: 69  SpO2: 98%   General: Patient appears well. No acute distress. Eyes: Sclera non-icteric. No conjunctival injection.  HENT: MMM. No nasal discharge. Respiratory: Lungs are CTA, bilaterally. No wheezes, rales, or rhonchi.  Cardiovascular: Regular rate and rhythm. No murmurs, rubs, or gallops. No lower extremity edema. Abdominal: Abdomen is initially diffusely tender to palpation and voluntary guarding, although pain and guarding are distractible, without rebound. Bowel sounds intact. No abdominal distention.  GU: No CVA tenderness. Chaperone was present throughout pelvic exam. External examination showed no valvular or paravalvular lesions, rashes, or masses. Introitus was not narrowed. There was some thin, yellow-ish cervical discharge without noticeable odor. No cervical lesion or other bleeding visualized.  MSK: There is mild bilateral tenderness over SI joints. No paraspinal muscle or bony spinal tenderness.  Skin: No lesions. No rashes.  Psych: Normal affect. Normal tone of voice.   Assessment & Plan:   See Encounters Tab for problem based  charting.  Patient discussed with Dr. Antony Contras.  Glenford Bayley, MD 08/25/2020, 10:08 PM Pager: [phone]

## 2020-08-25 NOTE — Assessment & Plan Note (Signed)
A: Patient has history of recurrent UTI with persistent microscopic hematuria. She denies symptoms at this time aside from continued blood in her urine; however, was just treated for UTI w/ Keflex about 2 months ago. Dipstick without hemoglobin or evidence of infection.   P:  - Check urinalysis w/ reflex microscopy

## 2020-08-25 NOTE — Assessment & Plan Note (Signed)
A: Patient continues to use marijuana regularly, stating it "balances her" mentally.   P: Continue to encourage cessation.

## 2020-08-25 NOTE — Patient Instructions (Addendum)
Nicole [name],   Your Adult Care Home FL2 Form was filled out for you today. A pelvic exam was performed to assess your recent cervical tear. Your cervix appears to be healing nicely, without any bleeding. You did have some discharge, so a swab was performed. Your urine did not have any blood in it today, however, we will still check a urinalysis to see if there is any evidence of infection.   I will call you back with the results of your swab and urine study and let you know if any further treatment is indicated.   We will also repeat your cholesterol levels today and I will call you with results.   Please continue to eat a diet low in saturated fats and continue to exercise regularly. Even a few pounds of weight loss can drastically improve your cholesterol.   Please call if you experience any worsening pain, continued vaginal bleeding or blood in your urine.   Please call to schedule a follow up in about 6 months, or sooner if you either hear from me otherwise or have any new concerning complaints.   Thank you and take care!  Dr. Laddie Aquas  High Cholesterol  High cholesterol is a condition in which the blood has high levels of a white, waxy, fat-like substance (cholesterol). The human body needs small amounts of cholesterol. The liver makes all the cholesterol that the body needs. Extra (excess) cholesterol comes from the food that we eat. Cholesterol is carried from the liver by the blood through the blood vessels. If you have high cholesterol, deposits (plaques) may build up on the walls of your blood vessels (arteries). Plaques make the arteries narrower and stiffer. Cholesterol plaques increase your risk for heart attack and stroke. Work with your health care provider to keep your cholesterol levels in a healthy range. What increases the risk? This condition is more likely to develop in people who:  Eat foods that are high in animal fat (saturated fat) or cholesterol.  Are  overweight.  Are not getting enough exercise.  Have a family history of high cholesterol. What are the signs or symptoms? There are no symptoms of this condition. How is this diagnosed? This condition may be diagnosed from the results of a blood test.  If you are older than age 56, your health care provider may check your cholesterol every 4-6 years.  You may be checked more often if you already have high cholesterol or other risk factors for heart disease. The blood test for cholesterol measures:  "Bad" cholesterol (LDL cholesterol). This is the main type of cholesterol that causes heart disease. The desired level for LDL is less than 100.  "Good" cholesterol (HDL cholesterol). This type helps to protect against heart disease by cleaning the arteries and carrying the LDL away. The desired level for HDL is 60 or higher.  Triglycerides. These are fats that the body can store or burn for energy. The desired number for triglycerides is lower than 150.  Total cholesterol. This is a measure of the total amount of cholesterol in your blood, including LDL cholesterol, HDL cholesterol, and triglycerides. A healthy number is less than 200. How is this treated? This condition is treated with diet changes, lifestyle changes, and medicines. Diet changes  This may include eating more whole grains, fruits, vegetables, nuts, and fish.  This may also include cutting back on red meat and foods that have a lot of added sugar. Lifestyle changes  Changes may include getting at  least 40 minutes of aerobic exercise 3 times a week. Aerobic exercises include walking, biking, and swimming. Aerobic exercise along with a healthy diet can help you maintain a healthy weight.  Changes may also include quitting smoking. Medicines  Medicines are usually given if diet and lifestyle changes have failed to reduce your cholesterol to healthy levels.  Your health care provider may prescribe a statin medicine.  Statin medicines have been shown to reduce cholesterol, which can reduce the risk of heart disease. Follow these instructions at home: Eating and drinking If told by your health care provider:  Eat chicken (without skin), fish, veal, shellfish, ground [place] breast, and round or loin cuts of red meat.  Do not eat fried foods or fatty meats, such as hot dogs and salami.  Eat plenty of fruits, such as apples.  Eat plenty of vegetables, such as broccoli, potatoes, and carrots.  Eat beans, peas, and lentils.  Eat grains such as barley, rice, couscous, and bulgur wheat.  Eat pasta without cream sauces.  Use skim or nonfat milk, and eat low-fat or nonfat yogurt and cheeses.  Do not eat or drink whole milk, cream, ice cream, egg yolks, or hard cheeses.  Do not eat stick margarine or tub margarines that contain trans fats (also called partially hydrogenated oils).  Do not eat saturated tropical oils, such as coconut oil and palm oil.  Do not eat cakes, cookies, crackers, or other baked goods that contain trans fats.  General instructions  Exercise as directed by your health care provider. Increase your activity level with activities such as gardening, walking, and taking the stairs.  Take over-the-counter and prescription medicines only as told by your health care provider.  Do not use any products that contain nicotine or tobacco, such as cigarettes and e-cigarettes. If you need help quitting, ask your health care provider.  Keep all follow-up visits as told by your health care provider. This is important. Contact a health care provider if:  You are struggling to maintain a healthy diet or weight.  You need help to start on an exercise program.  You need help to stop smoking. Get help right away if:  You have chest pain.  You have trouble breathing. This information is not intended to replace advice given to you by your health care provider. Make sure you discuss any  questions you have with your health care provider. Document Revised: 08/09/2017 Document Reviewed: 02/04/2016 Elsevier Patient Education  [place].

## 2020-08-25 NOTE — Assessment & Plan Note (Signed)
A: Patient desired for pelvic examination today given recent cervical laceration that she attributes to rough sexual intercourse. Pelvic exam with chaperone showed no obvious cervical lesion although did demonstrate thin, yellow-ish vaginal discharge.   P: Assess cervicovaginal ancillary swab  - Treat infection as indicated

## 2020-08-26 ENCOUNTER — Telehealth: Payer: Self-pay | Admitting: Student

## 2020-08-26 LAB — LIPID PANEL
Chol/HDL Ratio: 3.4 ratio (ref 0.0–4.4)
Cholesterol, Total: 217 mg/dL — ABNORMAL HIGH (ref 100–199)
HDL: 63 mg/dL (ref >39)
LDL Chol Calc (NIH): 142 mg/dL — ABNORMAL HIGH (ref 0–99)
Triglycerides: 69 mg/dL (ref 0–149)
VLDL Cholesterol Cal: 12 mg/dL (ref 5–40)

## 2020-08-26 LAB — CERVICOVAGINAL ANCILLARY ONLY
Bacterial Vaginitis (gardnerella): NEGATIVE
Candida Glabrata: NEGATIVE
Candida Vaginitis: NEGATIVE
Chlamydia: NEGATIVE
Comment: NEGATIVE
Comment: NEGATIVE
Comment: NEGATIVE
Comment: NEGATIVE
Comment: NEGATIVE
Comment: NORMAL
Neisseria Gonorrhea: NEGATIVE
Trichomonas: NEGATIVE

## 2020-08-26 LAB — URINALYSIS, ROUTINE W REFLEX MICROSCOPIC
Bilirubin, UA: NEGATIVE
Glucose, UA: NEGATIVE
Ketones, UA: NEGATIVE
Leukocytes,UA: NEGATIVE
Nitrite, UA: NEGATIVE
Protein,UA: NEGATIVE
RBC, UA: NEGATIVE
Specific Gravity, UA: 1.02 (ref 1.005–1.030)
Urobilinogen, Ur: 0.2 mg/dL (ref 0.2–1.0)
pH, UA: 7.5 (ref 5.0–7.5)

## 2020-08-26 NOTE — Telephone Encounter (Signed)
Called Ms. Hulbert to notify her that her cholesterol is high. ASCVD risk is borderline at 7.7%. She states that she prefers not to initiate any new medications for this and will try lifestyle modifications. Educated patient on avoiding saturated and trans-fats and continued efforts towards weight loss and exercise. She says she will continue to eat honey and drink tea. Informed her her urine did not show blood or signs of infection and vaginal swab returned negative.   Jeralyn Bennett, PGY1 Internal medicine  [phone]

## 2020-08-31 NOTE — Progress Notes (Signed)
Internal Medicine Clinic Attending  I saw and evaluated the patient.  I personally confirmed the key portions of the history and exam documented by Dr. Speakman and I reviewed pertinent patient test results.  The assessment, diagnosis, and plan were formulated together and I agree with the documentation in the resident's note.  

## 2020-09-27 DIAGNOSIS — F3132 Bipolar disorder, current episode depressed, moderate: Secondary | ICD-10-CM | POA: Diagnosis not present

## 2020-11-01 ENCOUNTER — Ambulatory Visit: Payer: Medicare HMO

## 2020-11-08 ENCOUNTER — Ambulatory Visit: Payer: Medicare HMO

## 2020-11-14 ENCOUNTER — Other Ambulatory Visit (HOSPITAL_COMMUNITY)
Admission: RE | Admit: 2020-11-14 | Discharge: 2020-11-14 | Disposition: A | Discharge status: Home / Self Care | Payer: Medicare HMO | Source: Ambulatory Visit | Attending: Family Medicine | Admitting: Family Medicine

## 2020-11-14 ENCOUNTER — Other Ambulatory Visit: Payer: Self-pay

## 2020-11-14 ENCOUNTER — Ambulatory Visit (INDEPENDENT_AMBULATORY_CARE_PROVIDER_SITE_OTHER): Payer: Medicare HMO

## 2020-11-14 ENCOUNTER — Ambulatory Visit: Payer: Medicare HMO

## 2020-11-14 VITALS — BP 101/66 | HR 75 | Wt 180.3 lb

## 2020-11-14 DIAGNOSIS — N898 Other specified noninflammatory disorders of vagina: Secondary | ICD-10-CM | POA: Diagnosis not present

## 2020-11-14 DIAGNOSIS — Z113 Encounter for screening for infections with a predominantly sexual mode of transmission: Secondary | ICD-10-CM | POA: Insufficient documentation

## 2020-11-14 MED ORDER — FLUCONAZOLE 150 MG PO TABS: 150.0000 mg | ORAL_TABLET | Freq: Once | ORAL | 0 refills | Status: AC

## 2020-11-14 NOTE — Progress Notes (Signed)
Here today for abnormal vaginal discharge with itching, appears like cottage cheese. States the odor smells like "puss or infection." Self-swab instructions given and specimen collected. Encouraged continued use of lubricant for vaginal irritation. Diflucan 150 mg sent to pharmacy. Explained we will call with results and any further treatment if needed.   Apolonio Schneiders RN 11/14/20

## 2020-11-14 NOTE — Progress Notes (Signed)
Patient was assessed and managed by nursing staff during this encounter. I have reviewed the chart and agree with the documentation and plan. I have also made any necessary editorial changes.  Aletha Halim, MD 11/14/2020 1:14 PM

## 2020-11-15 LAB — CERVICOVAGINAL ANCILLARY ONLY
Bacterial Vaginitis (gardnerella): NEGATIVE
Candida Glabrata: NEGATIVE
Candida Vaginitis: NEGATIVE
Chlamydia: NEGATIVE
Comment: NEGATIVE
Comment: NEGATIVE
Comment: NEGATIVE
Comment: NEGATIVE
Comment: NEGATIVE
Comment: NORMAL
Neisseria Gonorrhea: NEGATIVE
Trichomonas: NEGATIVE

## 2020-11-21 ENCOUNTER — Telehealth: Payer: Self-pay | Admitting: *Deleted

## 2020-11-21 NOTE — Telephone Encounter (Signed)
Returned patients call. Received a message that call could not be completed at this time. Will need to try again at another time.

## 2020-11-21 NOTE — Telephone Encounter (Addendum)
Pt left VM message stating that she would like her test results. Please call back.   [place] pt @ [phone] and left VM message stating that I am returning her call. I requested that she call back with a new message and state which test results she would like to have and also if detailed information can be provided on her voicemail.

## 2020-11-22 NOTE — Telephone Encounter (Signed)
Spoke with pt. Pt given negative results from 11/14/20. Pt verbalized understanding and agreeable to results. No further questions or concerns at this time.  Colletta Maryland, RN 11/22/20

## 2020-11-22 NOTE — Telephone Encounter (Signed)
Call placed to mother's phone number on DPR. Gave mother office number for pt to return call to [facility].

## 2020-11-28 DIAGNOSIS — B353 Tinea pedis: Secondary | ICD-10-CM | POA: Diagnosis not present

## 2020-11-28 DIAGNOSIS — M12272 Villonodular synovitis (pigmented), left ankle and foot: Secondary | ICD-10-CM | POA: Diagnosis not present

## 2020-11-28 DIAGNOSIS — M12271 Villonodular synovitis (pigmented), right ankle and foot: Secondary | ICD-10-CM | POA: Diagnosis not present

## 2020-11-28 DIAGNOSIS — M722 Plantar fascial fibromatosis: Secondary | ICD-10-CM | POA: Diagnosis not present

## 2020-12-20 ENCOUNTER — Encounter: Payer: Self-pay | Admitting: *Deleted

## 2020-12-20 NOTE — Progress Notes (Signed)

## 2020-12-21 NOTE — Progress Notes (Signed)
Things That May Be Affecting Your Health:  Alcohol  Hearing loss  Pain   + Depression  Home Safety  Sexual Health   Diabetes  Lack of physical activity  Stress   Difficulty with daily activities  Loneliness  Tiredness   Drug use + Medicines  Tobacco use  + Falls  Motor Vehicle Safety  Weight   Food choices  Oral Health  Other    YOUR PERSONALIZED HEALTH PLAN : 1. Schedule your next subsequent Medicare Wellness visit in one year 2. Attend all of your regular appointments to address your medical issues 3. Complete the preventative screenings and services   Annual Wellness Visit   Medicare Covered Preventative Screenings and [place] Men and Women Who How Often Need? Date of Last Service Action  Abdominal Aortic Aneurysm Adults with AAA risk factors Once      Alcohol Misuse and Counseling All Adults Screening once a year if no alcohol misuse. Counseling up to 4 face to face sessions.     Bone Density Measurement  Adults at risk for osteoporosis Once every 2 yrs      Lipid Panel Z13.6 All adults without CV disease Once every 5 yrs       Colorectal Cancer   Stool sample or  Colonoscopy All adults 66 and older   Once every year  Every 10 years        Depression All Adults Once a year  Today   Diabetes Screening Blood glucose, post glucose load, or GTT Z13.1  All adults at risk  Pre-diabetics  Once per year  Twice per year      Diabetes  Self-Management Training All adults Diabetics 10 hrs first year; 2 hours subsequent years. Requires Copay     Glaucoma  Diabetics  Family history of glaucoma  African Americans 67 yrs +  Hispanic Americans 45 yrs + Annually - requires coppay      Hepatitis C Z72.89 or F19.20  High Risk for HCV  Born between 1945 and 1965  Annually  Once      HIV Z11.4 All adults based on risk  Annually btw ages 32 & 7 regardless of risk  Annually > 65 yrs if at increased risk      Lung Cancer Screening  Asymptomatic adults aged 44-77 with 30 pack yr history and current smoker OR quit within the last 15 yrs Annually Must have counseling and shared decision making documentation before first screen      Medical Nutrition Therapy Adults with   Diabetes  Renal disease  Kidney transplant within past 3 yrs 3 hours first year; 2 hours subsequent years     Obesity and Counseling All adults Screening once a year Counseling if BMI 30 or higher  Today   Tobacco Use Counseling Adults who use tobacco  Up to 8 visits in one year     Vaccines Z23  Hepatitis B  Influenza   Pneumonia  Adults   Once  Once every flu season  Two different vaccines separated by one year     Next Annual Wellness Visit People with Medicare Every year  Today    Covid Vaccine   Services & Screenings Women Who How Often Need  Date of Last Service Action  Mammogram  Z12.31 Women over 13 One baseline ages 22-39. Annually ager 40 yrs+      Pap tests All women Annually if high risk. Every 2 yrs for normal risk women  Screening for cervical cancer with   Pap (Z01.419 nl or Z01.411abnl) &  HPV Z11.51 Women aged 57 to 13 Once every 5 yrs     Screening pelvic and breast exams All women Annually if high risk. Every 2 yrs for normal risk women     Sexually Transmitted Diseases  Chlamydia  Gonorrhea  Syphilis All at risk adults Annually for non pregnant females at increased risk         [place] Men Who How Ofter Need  Date of Last Service Action  Prostate Cancer - DRE & PSA Men over 50 Annually.  DRE might require a copay.        Sexually Transmitted Diseases  Syphilis All at risk adults Annually for men at increased risk      Health Maintenance List Health Maintenance  Topic Date Due  . COVID-19 Vaccine (1) Never done  . INFLUENZA VACCINE  03/20/2021  . MAMMOGRAM  12/07/2021  . TETANUS/TDAP  10/28/2022  . PAP SMEAR-Modifier  06/24/2025  . COLONOSCOPY (Pts 45-58yrs Insurance  coverage will need to be confirmed)  01/19/2027  . Hepatitis C Screening  Completed  . HIV Screening  Completed  . HPV VACCINES  Aged Out

## 2020-12-29 ENCOUNTER — Other Ambulatory Visit: Payer: Self-pay | Admitting: Internal Medicine

## 2020-12-29 DIAGNOSIS — Z1231 Encounter for screening mammogram for malignant neoplasm of breast: Secondary | ICD-10-CM

## 2021-01-04 ENCOUNTER — Ambulatory Visit (INDEPENDENT_AMBULATORY_CARE_PROVIDER_SITE_OTHER): Payer: Medicare HMO | Admitting: Internal Medicine

## 2021-01-04 ENCOUNTER — Encounter: Payer: Self-pay | Admitting: Internal Medicine

## 2021-01-04 ENCOUNTER — Ambulatory Visit (HOSPITAL_COMMUNITY)
Admission: RE | Admit: 2021-01-04 | Discharge: 2021-01-04 | Disposition: A | Discharge status: Home / Self Care | Payer: Medicare HMO | Source: Ambulatory Visit | Attending: Internal Medicine | Admitting: Internal Medicine

## 2021-01-04 ENCOUNTER — Other Ambulatory Visit: Payer: Self-pay

## 2021-01-04 VITALS — BP 97/78 | HR 80 | Temp 98.1°F | Ht 65.5 in | Wt 179.4 lb

## 2021-01-04 DIAGNOSIS — M25511 Pain in right shoulder: Secondary | ICD-10-CM | POA: Diagnosis not present

## 2021-01-04 DIAGNOSIS — R1900 Intra-abdominal and pelvic swelling, mass and lump, unspecified site: Secondary | ICD-10-CM

## 2021-01-04 DIAGNOSIS — F3160 Bipolar disorder, current episode mixed, unspecified: Secondary | ICD-10-CM

## 2021-01-04 DIAGNOSIS — M25561 Pain in right knee: Secondary | ICD-10-CM | POA: Insufficient documentation

## 2021-01-04 DIAGNOSIS — N63 Unspecified lump in unspecified breast: Secondary | ICD-10-CM

## 2021-01-04 DIAGNOSIS — M6258 Muscle wasting and atrophy, not elsewhere classified, other site: Secondary | ICD-10-CM

## 2021-01-04 DIAGNOSIS — N6310 Unspecified lump in the right breast, unspecified quadrant: Secondary | ICD-10-CM | POA: Insufficient documentation

## 2021-01-04 DIAGNOSIS — D225 Melanocytic nevi of trunk: Secondary | ICD-10-CM | POA: Diagnosis not present

## 2021-01-04 HISTORY — DX: Pain in right shoulder: M25.511

## 2021-01-04 HISTORY — DX: Muscle wasting and atrophy, not elsewhere classified, other site: M62.58

## 2021-01-04 HISTORY — DX: Unspecified lump in unspecified breast: N63.0

## 2021-01-04 HISTORY — DX: Pain in right knee: M25.561

## 2021-01-04 NOTE — Progress Notes (Signed)
   CC: Right knee pain  HPI:  Nicole [name] is a 60 y.o. with a PMHx as listed below who presents to the clinic for right knee pain.   Please see the Encounters tab for problem-based Assessment & Plan regarding status of patient's acute and chronic conditions.  Past Medical History:  Diagnosis Date  . Anemia   . Anxiety   . Depression    bipolar  . GERD (gastroesophageal reflux disease)   . History of migraine headaches    headaches reported as migraines  . Hypercholesterolemia   . Hypotension   . Obesity   . Palpitations    negative cardiac w/u per Dr. Verl Blalock 2/09  . Reflux esophagitis   . Sleep apnea    No CPAP   Review of Systems: Review of Systems  Constitutional: Negative for chills, fever, malaise/fatigue and weight loss.  Respiratory: Negative for cough, shortness of breath and wheezing.   Cardiovascular: Negative for chest pain and palpitations.  Gastrointestinal: Negative for abdominal pain, diarrhea, nausea and vomiting.  Musculoskeletal: Positive for falls and joint pain. Negative for back pain, myalgias and neck pain.  Neurological: Negative for dizziness and headaches.   Physical Exam:  Vitals:   01/04/21 1417  BP: 97/78  Pulse: 80  Temp: 98.1 F (36.7 C)  TempSrc: Oral  SpO2: 98%  Weight: 179 lb 6.4 oz (81.4 kg)  Height: 5' 5.5" (1.664 m)   Physical Exam Constitutional:      General: She is not in acute distress.    Appearance: She is normal weight.  Cardiovascular:     Rate and Rhythm: Normal rate and regular rhythm.     Heart sounds: No murmur heard. No gallop.   Pulmonary:     Effort: Pulmonary effort is normal. No respiratory distress.     Breath sounds: Normal breath sounds. No wheezing, rhonchi or rales.  Abdominal:     General: Bowel sounds are normal.     Palpations: Abdomen is soft.  Musculoskeletal:     Right shoulder: Tenderness (Tenderness to palpation over the superior aspect of the right shoulder overlying joint line.) present.  No swelling, deformity, effusion, laceration, bony tenderness or crepitus. Normal range of motion. Normal strength.     Right knee: Crepitus present. No swelling, deformity, effusion, erythema, ecchymosis, lacerations or bony tenderness. Normal range of motion. No tenderness. No LCL laxity, MCL laxity, ACL laxity or PCL laxity. Normal meniscus.     Right lower leg: No edema.     Left lower leg: No edema.     Comments: On palpation of the right knee, there is crepitus located at the inferior aspect of the patella without significant tenderness on palpation.  Able to bear weight.  Skin:    General: Skin is warm and dry.  Neurological:     General: No focal deficit present.     Mental Status: She is alert and oriented to person, place, and time. Mental status is at baseline.  Psychiatric:        Mood and Affect: Mood normal.        Speech: Speech is tangential.        Behavior: Behavior is cooperative.        Thought Content: Thought content is paranoid.    Assessment & Plan:   See Encounters Tab for problem based charting.  Patient discussed with Dr. Dareen Piano

## 2021-01-04 NOTE — Patient Instructions (Addendum)
It was nice seeing you today! Thank you for choosing [facility] for your Primary Care.    Today we talked about:   1. Knee Pain: I will order an x-ray to make sure there is no bony problems. Overall, I feel physical therapy will be helpful to strength your knee again though.   2. Shoulder pain: I will order an x-ray.   3. For both the knee and shoulder pain, continue taking Tylenol every 6 - 8 hours.   4. For the area on your stomach, I would call the surgeon you previously saw. He will be able to determine the best type of imaging.

## 2021-01-05 DIAGNOSIS — R1900 Intra-abdominal and pelvic swelling, mass and lump, unspecified site: Secondary | ICD-10-CM | POA: Insufficient documentation

## 2021-01-05 NOTE — Assessment & Plan Note (Signed)
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  Per chart review this is a concern of hers' dating back to as far as 2016.  I am concerned that this may be related to her bipolar disorder.    -I encouraged patient to follow-up with her mental health clinic.

## 2021-01-05 NOTE — Assessment & Plan Note (Signed)
Nicole [name] states that she has a red spot on her right breast that developed after feeling a bump beneath.  She followed up with her OB/GYN who referred her to general surgery but has not followed up.  She is concerned that this may be related to some form of cancer.  She denies any breast tenderness at this time.  She had a mammogram in 2021 that demonstrated normal scattered fibroglandular tissue.  Assessment/plan: On examination, patient has approximately 3 mm circular nevus with uniform coloring and no irregular borders.  Patient is requesting removal at this time.  -Referral to dermatology placed with request of a different dermatologist and she seen in the past.

## 2021-01-05 NOTE — Progress Notes (Signed)
Internal Medicine Clinic Attending  Case discussed with Dr. Basaraba  At the time of the visit.  We reviewed the resident's history and exam and pertinent patient test results.  I agree with the assessment, diagnosis, and plan of care documented in the resident's note.  

## 2021-01-05 NOTE — Assessment & Plan Note (Signed)
Nicole [name] states that she is concerned regarding some left-sided temporal wasting that she has noticed over the last several months.  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  She states that she has been worked up for this in the past and she was never told her diagnosis, however that the physicians at [facility] knew what were really going on but did not want to tell her.  She has been having headaches in that area and feel this is also related to the STI.  Assessment/plan: Extremely mild temporal wasting bilaterally, mildly more apparent on the left.  Reassured patient that this is a benign finding in the setting of no other red flag symptoms.

## 2021-01-05 NOTE — Assessment & Plan Note (Addendum)
Ms. Kluge states that for the past 1 week she has been experiencing right shoulder pain that she believes is from sleeping on it.  The pain is located on the top of her shoulder.  She is very concerned that this may be developing arthritis and would like some imaging.  Assessment/plan: No crepitus on examination.  Although patient does have some tenderness to palpation along the superior aspect of the joint line.  We will go ahead and order imaging as requested.  - Right shoulder x-ray ordered  ADDENDUM:  Xray negative for osteoarthritis or acute abnormalities. Attempted to contact patient x1 to discuss and VM let.

## 2021-01-05 NOTE — Assessment & Plan Note (Addendum)
Ms. Keetch states that approximately 1 month ago, she tripped on a piece of metal in the grass and landed on her right knee causing an abrasion.  She did not have any acute knee pain at that time and was able to continue walking normally.  Then, approximately 2 weeks ago she was crossing the street and tried to go from a walking to running pace.  She experienced some weakness in her ankle when she did this, and o onto her knee and cause a larger abrasion to the same area affected prior.  Since then, she is been having some knee pain at times and is concerned that the area is not healing well.  She feels as though there is some asphalt underneath the scar.  She is still able to bear weight.  Assessment/plan: On examination there is some crepitus at the inferior aspect of the patella and some unevenness to the scar forming where the abrasion previously was according to her pictures.  Given that patient is able to bear weight without any pain, very low suspicion for any ligament tears.  Patient is requesting a x-ray for further evaluation and we will go ahead and order now.  - Recommended supportive care with Tylenol - Knee x-ray ordered  ADDENDUM:  Xray negative for abnormality. Attempted to contact patient to discuss and VM left.

## 2021-01-05 NOTE — Assessment & Plan Note (Signed)
Nicole [name] states that she initially had a small bump on her umbilicus that has been growing over the past year and a half.  It is nontender, nonerythematous, however it is bothering her cosmetically.  She is also concerned about why they may be growing.  Assessment/plan: On examination, patient has a small 3 mm soft mass located at the superior aspect of the umbilicus that is nontender and nonerythematous.  I reassured her that this is likely benign, however patient would like further evaluation.   -I recommended she follow-up with her general surgeon given this is the physician that would be performing any sort of intervention.

## 2021-01-07 ENCOUNTER — Encounter: Payer: Self-pay | Admitting: *Deleted

## 2021-01-09 ENCOUNTER — Ambulatory Visit: Payer: Medicare HMO | Admitting: Student

## 2021-01-09 ENCOUNTER — Other Ambulatory Visit: Payer: Self-pay

## 2021-01-11 ENCOUNTER — Telehealth: Payer: Self-pay

## 2021-01-11 DIAGNOSIS — R8271 Bacteriuria: Secondary | ICD-10-CM | POA: Diagnosis not present

## 2021-01-11 DIAGNOSIS — R102 Pelvic and perineal pain: Secondary | ICD-10-CM | POA: Diagnosis not present

## 2021-01-11 DIAGNOSIS — R3982 Chronic bladder pain: Secondary | ICD-10-CM | POA: Diagnosis not present

## 2021-01-11 DIAGNOSIS — N342 Other urethritis: Secondary | ICD-10-CM | POA: Diagnosis not present

## 2021-01-11 NOTE — Telephone Encounter (Signed)
I was able to reach patient and discuss results. Thank you!

## 2021-01-11 NOTE — Telephone Encounter (Signed)
Requesting lab results, please call pt back.  

## 2021-02-08 ENCOUNTER — Telehealth: Payer: Self-pay

## 2021-02-08 NOTE — Progress Notes (Signed)
Cardiology Office Note   Date:  02/09/2021   ID:  [name], [name] 10-22-1960, MRN [id number]  PCP:  [name], MD  Cardiologist:   Minus Breeding, MD Referring:  [name], MD  Chief Complaint  Patient presents with   Palpitations       History of Present Illness: Nicole [name] is a 60 y.o. female who presents for evaluation of chest pain.  I saw her last in 2018 for evaluation of chest pain.  She had a negative POET ([employer] Exercise Treadmill).    She had an echocardiogram 2009 which was essentially normal.    She was having palpitations and she was to wear a monitor when I saw her last year.  She actually was sent the monitor but did not put it on.  It still sitting in her hands.  Since I last saw her she has had 1 episode of tachypalpitations.   She scheduled the appt to discuss this issue.  This is different than previous.  It was a rapid heart rate.  She said it was beating very fast but she did not record it with anything.  She said she had to do some coughing to get it to stop.  This is about 2 weeks ago.  She has not had this otherwise.  She is otherwise been doing okay.  She has been going to the gym recently. The patient denies any new symptoms such as neck or arm discomfort. There has been no new shortness of breath, PND or orthopnea. There have been no reported palpitations, presyncope or syncope.   She does get some midsternal chest pain that happens sporadically as it has been mild to moderate.  There is no associated nausea vomiting or diaphoresis.  It comes and goes spontaneously.  Its been a relatively stable pattern.      Past Medical History:  Diagnosis Date   Anemia    Anxiety    Depression    bipolar   GERD (gastroesophageal reflux disease)    History of migraine headaches    headaches reported as migraines   Hypercholesterolemia    Hypotension    Obesity    Palpitations    negative cardiac w/u per Dr. Verl Blalock 2/09   Reflux  esophagitis    Sleep apnea    No CPAP    Past Surgical History:  Procedure Laterality Date   COLONOSCOPY     ECTOPIC PREGNANCY SURGERY     EXPLORATORY LAPAROTOMY  2000   x2 (Dr. Kendall Flack)   HERPES SIMPLEX VIRUS DFA     TUBAL LIGATION  1987    Current Outpatient Medications  Medication Sig Dispense Refill   Ascorbic Acid (VITAMIN C) 100 MG tablet Take 100 mg by mouth daily.     Cholecalciferol (VITAMIN D3 PO) Take by mouth.     Cyanocobalamin (VITAMIN B-12 PO) Take by mouth.     No current facility-administered medications for this visit.    Allergies:   Other    ROS:  Please see the history of present illness.   Otherwise, review of systems are positive for none.   All other systems are reviewed and negative.    PHYSICAL EXAM: VS:  BP 110/82 (BP Location: Left Arm, Patient Position: Sitting, Cuff Size: Normal)   Pulse 62   Ht 5\' 5"  (1.651 m)   Wt 176 lb 9.6 oz (80.1 kg)   LMP 03/03/2016 (Approximate)   SpO2 95%  BMI 29.39 kg/m  , BMI Body mass index is 29.39 kg/m. GENERAL:  Well appearing NECK:  No jugular venous distention, waveform within normal limits, carotid upstroke brisk and symmetric, no bruits, no thyromegaly LUNGS:  Clear to auscultation bilaterally CHEST:  Unremarkable HEART:  PMI not displaced or sustained,S1 and S2 within normal limits, no S3, no S4, no clicks, no rubs, no murmurs ABD:  Flat, positive bowel sounds normal in frequency in pitch, no bruits, no rebound, no guarding, no midline pulsatile mass, no hepatomegaly, no splenomegaly EXT:  2 plus pulses throughout, no edema, no cyanosis no clubbing    EKG:  EKG is  ordered today. The ekg ordered today demonstrates sinus rhythm, rate 62.,  Axis within normal limits, intervals within normal limits, no acute ST-T wave changes.   Recent Labs: No results found for requested labs within last 8760 hours.    Lipid Panel    Component Value Date/Time   CHOL 217 (H) 08/25/2020 0952   TRIG 69  08/25/2020 0952   HDL 63 08/25/2020 0952   CHOLHDL 3.4 08/25/2020 0952   CHOLHDL 3.1 Ratio 01/04/2010 2112   VLDL 13 01/04/2010 2112   LDLCALC 142 (H) 08/25/2020 0952      Wt Readings from Last 3 Encounters:  02/09/21 176 lb 9.6 oz (80.1 kg)  01/04/21 179 lb 6.4 oz (81.4 kg)  11/14/20 180 lb 4.8 oz (81.8 kg)      Other studies Reviewed: Additional studies/ records that were reviewed today include: It sounds like the patient is Review of the above records demonstrates:  Please see elsewhere in the note.     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  She did wear the monitor so I will try to send her another one.  Rather she thinks that she will buy a Chad.  She does not want any other therapy at this point because they are so infrequent.  We did discuss pill in pocket medications once I see the arrhythmia.  We briefly discussed the potential for ablation.   MARIJUANA USE:      She has cut way back on the use of this and is not using this since 4/20.     Current medicines are reviewed at length with the patient today.  The patient does not have concerns regarding medicines.  The following changes have been made:  None  Labs/ tests ordered today include: No  No orders of the defined types were placed in this encounter.    Disposition:   FU with me in one year.    Signed, Minus Breeding, MD  02/09/2021 9:01 AM    [place]

## 2021-02-08 NOTE — Telephone Encounter (Signed)
This RN attempted to call patient per Dr. Rosezella [place] request to confirm pt's appointment tomorrow. Call rang several times, then silence and a beep. Unclear if an answering machine with no message or if phone cuts off. No message left.

## 2021-02-09 ENCOUNTER — Ambulatory Visit (INDEPENDENT_AMBULATORY_CARE_PROVIDER_SITE_OTHER): Payer: Medicare HMO | Admitting: Cardiology

## 2021-02-09 ENCOUNTER — Other Ambulatory Visit: Payer: Self-pay

## 2021-02-09 ENCOUNTER — Encounter: Payer: Self-pay | Admitting: Cardiology

## 2021-02-09 VITALS — BP 110/82 | HR 62 | Ht 65.0 in | Wt 176.6 lb

## 2021-02-09 DIAGNOSIS — R079 Chest pain, unspecified: Secondary | ICD-10-CM

## 2021-02-09 NOTE — Patient Instructions (Signed)
Medication Instructions:  Your physician recommends that you continue on your current medications as directed. Please refer to the Current Medication list given to you today.  *If you need a refill on your cardiac medications before your next appointment, please call your pharmacy*   Lab Work: TSH, Lipid today.   If you have labs (blood work) drawn today and your tests are completely normal, you will receive your results only by: [place] (if you have MyChart) OR A paper copy in the mail If you have any lab test that is abnormal or we need to change your treatment, we will call you to review the results.   Testing/Procedures: None ordered.    Follow-Up: At [facility], you and your health needs are our priority.  As part of our continuing mission to provide you with exceptional heart care, we have created designated Provider Care Teams.  These Care Teams include your primary Cardiologist (physician) and Advanced Practice Providers (APPs -  Physician Assistants and Nurse Practitioners) who all work together to provide you with the care you need, when you need it.  We recommend signing up for the patient portal called "MyChart".  Sign up information is provided on this After Visit Summary.  MyChart is used to connect with patients for Virtual Visits (Telemedicine).  Patients are able to view lab/test results, encounter notes, upcoming appointments, etc.  Non-urgent messages can be sent to your provider as well.   To learn more about what you can do with MyChart, go to [email or web address].    Your next appointment:   1 year(s)  The format for your next appointment:   In Person  Provider:   Minus Breeding, MD   Other Instructions Per Dr. Percival Spanish consider a Kardia monitor.

## 2021-02-10 LAB — LIPID PANEL
Chol/HDL Ratio: 3.3 ratio (ref 0.0–4.4)
Cholesterol, Total: 200 mg/dL — ABNORMAL HIGH (ref 100–199)
HDL: 61 mg/dL (ref >39)
LDL Chol Calc (NIH): 128 mg/dL — ABNORMAL HIGH (ref 0–99)
Triglycerides: 63 mg/dL (ref 0–149)
VLDL Cholesterol Cal: 11 mg/dL (ref 5–40)

## 2021-02-10 LAB — TSH: TSH: 0.994 u[IU]/mL (ref 0.450–4.500)

## 2021-02-13 ENCOUNTER — Encounter: Payer: Self-pay | Admitting: *Deleted

## 2021-02-16 ENCOUNTER — Encounter: Payer: Self-pay | Admitting: Internal Medicine

## 2021-02-16 ENCOUNTER — Ambulatory Visit (INDEPENDENT_AMBULATORY_CARE_PROVIDER_SITE_OTHER): Payer: Medicare HMO | Admitting: Internal Medicine

## 2021-02-16 ENCOUNTER — Other Ambulatory Visit: Payer: Self-pay

## 2021-02-16 DIAGNOSIS — Z0000 Encounter for general adult medical examination without abnormal findings: Secondary | ICD-10-CM | POA: Diagnosis not present

## 2021-02-16 NOTE — Progress Notes (Signed)
I discussed the AWV findings with the RN who conducted the visit. I was present in the office suite and immediately available to provide assistance and direction throughout the time the service was provided.   Harlow Ohms, DO PGY-2 IMTS

## 2021-02-16 NOTE — Patient Instructions (Addendum)
Things That May Be Affecting Your Health:   Alcohol   Hearing loss   Pain   + Depression   Home Safety   Sexual Health    Diabetes   Lack of physical activity   Stress    Difficulty with daily activities   Loneliness   Tiredness    Drug use + Medicines   Tobacco use  + Falls   Motor Vehicle Safety   Weight    Food choices   Oral Health   Other      YOUR PERSONALIZED HEALTH PLAN : 1. Schedule your next subsequent Medicare Wellness visit in one year 2. Attend all of your regular appointments to address your medical issues 3. Complete the preventative screenings and services 4. Please call for an appt with your behavioral health counselor to reestablish care 5. Congratulations on your 7% weight loss goal (<164 lbs)!! 6. Please consider receiving the Shingrix and pneumonia vaccines   Annual Wellness Visit                       Medicare Covered Preventative Screenings and [place] Men and Women Who How Often Need? Date of Last Service Action  Abdominal Aortic Aneurysm Adults with AAA risk factors Once        Alcohol Misuse and Counseling All Adults Screening once a year if no alcohol misuse. Counseling up to 4 face to face sessions.        Bone Density Measurement Adults at risk for osteoporosis Once every 2 yrs        Lipid Panel Z13.6 All adults without CV disease Once every 5 yrs            Colorectal Cancer Stool sample or Colonoscopy All adults 53 and older   Once every year Every 10 years              Depression All Adults Once a year   Today    Diabetes Screening Blood glucose, post glucose load, or GTT Z13.1 All adults at risk Pre-diabetics Once per year Twice per year          Diabetes  Self-Management Training All adults Diabetics 10 hrs first year; 2 hours subsequent years. Requires Copay        Glaucoma Diabetics Family history of glaucoma African Americans 27 yrs + Hispanic Americans 62 yrs + Annually - requires coppay           Hepatitis C Z72.89 or F19.20 High Risk for HCV Born between 1945 and 1965 Annually Once          HIV Z11.4 All adults based on risk Annually btw ages 32 & 35 regardless of risk Annually > 65 yrs if at increased risk          Lung Cancer Screening Asymptomatic adults aged 19-77 with 30 pack yr history and current smoker OR quit within the last 15 yrs Annually Must have counseling and shared decision making documentation before first screen          Medical Nutrition Therapy Adults with Diabetes Renal disease Kidney transplant within past 3 yrs 3 hours first year; 2 hours subsequent years        Obesity and Counseling All adults Screening once a year Counseling if BMI 30 or higher   Today    Tobacco Use Counseling Adults who use tobacco Up to 8 visits in one year  Vaccines Z23 Hepatitis B Influenza Pneumonia Adults   Once Once every flu season Two different vaccines separated by one year        Next Annual Wellness Visit People with Medicare Every year   Today      [place] Women Who How Often Need Date of Last Service Action  Mammogram  Z12.31 Women over 73 One baseline ages 72-39. Annually ager 40 yrs+          Pap tests All women Annually if high risk. Every 2 yrs for normal risk women          Screening for cervical cancer with Pap (Z01.419 nl or Z01.411abnl) & HPV Z11.51 Women aged 30 to 37 Once every 5 yrs        Screening pelvic and breast exams All women Annually if high risk. Every 2 yrs for normal risk women        Sexually Transmitted Diseases Chlamydia Gonorrhea Syphilis All at risk adults Annually for non pregnant females at increased risk                [place] Men Who How Ofter Need Date of Last Service Action  Prostate Cancer - DRE & PSA Men over 50 Annually. DRE might require a copay.              Sexually Transmitted Diseases Syphilis All at risk adults Annually for men at increased risk          Fall Prevention in the Home, Adult Falls can cause injuries and can happen to people of all ages. There are many things you can do to make your home safe and to help prevent falls. Ask forhelp when making these changes. What actions can I take to prevent falls? General Instructions Use good lighting in all rooms. Replace any light bulbs that burn out. Turn on the lights in dark areas. Use night-lights. Keep items that you use often in easy-to-reach places. Lower the shelves around your home if needed. Set up your furniture so you have a clear path. Avoid moving your furniture around. Do not have throw rugs or other things on the floor that can make you trip. Avoid walking on wet floors. If any of your floors are uneven, fix them. Add color or contrast paint or tape to clearly mark and help you see: Grab bars or handrails. First and last steps of staircases. Where the edge of each step is. If you use a stepladder: Make sure that it is fully opened. Do not climb a closed stepladder. Make sure the sides of the stepladder are locked in place. Ask someone to hold the stepladder while you use it. Know where your pets are when moving through your home. What can I do in the bathroom?     Keep the floor dry. Clean up any water on the floor right away. Remove soap buildup in the tub or shower. Use nonskid mats or decals on the floor of the tub or shower. Attach bath mats securely with double-sided, nonslip rug tape. If you need to sit down in the shower, use a plastic, nonslip stool. Install grab bars by the toilet and in the tub and shower. Do not use towel bars as grab bars. What can I do in the bedroom? Make sure that you have a light by your bed that is easy to reach. Do not use any sheets or blankets for your bed that hang  to the floor. Have a firm chair with side arms that you can use for support when you get dressed. What can I do in the kitchen? Clean up any spills right  away. If you need to reach something above you, use a step stool with a grab bar. Keep electrical cords out of the way. Do not use floor polish or wax that makes floors slippery. What can I do with my stairs? Do not leave any items on the stairs. Make sure that you have a light switch at the top and the bottom of the stairs. Make sure that there are handrails on both sides of the stairs. Fix handrails that are broken or loose. Install nonslip stair treads on all your stairs. Avoid having throw rugs at the top or bottom of the stairs. Choose a carpet that does not hide the edge of the steps on the stairs. Check carpeting to make sure that it is firmly attached to the stairs. Fix carpet that is loose or worn. What can I do on the outside of my home? Use bright outdoor lighting. Fix the edges of walkways and driveways and fix any cracks. Remove anything that might make you trip as you walk through a door, such as a raised step or threshold. Trim any bushes or trees on paths to your home. Check to see if handrails are loose or broken and that both sides of all steps have handrails. Install guardrails along the edges of any raised decks and porches. Clear paths of anything that can make you trip, such as tools or rocks. Have leaves, snow, or ice cleared regularly. Use sand or salt on paths during winter. Clean up any spills in your garage right away. This includes grease or oil spills. What other actions can I take? Wear shoes that: Have a low heel. Do not wear high heels. Have rubber bottoms. Feel good on your feet and fit well. Are closed at the toe. Do not wear open-toe sandals. Use tools that help you move around if needed. These include: Canes. Walkers. Scooters. Crutches. Review your medicines with your doctor. Some medicines can make you feel dizzy. This can increase your chance of falling. Ask your doctor what else you can do to help prevent falls. Where to find more  information Centers for Disease Control and Prevention, STEAD[employer]: http://www.wolf.info/ National Institute on Aging: http://kim-miller.com/ Contact a doctor if: You are afraid of falling at home. You feel weak, drowsy, or dizzy at home. You fall at home. Summary There are many simple things that you can do to make your home safe and to help prevent falls. Ways to make your home safe include removing things that can make you trip and installing grab bars in the bathroom. Ask for help when making these changes in your home. This information is not intended to replace advice given to you by your health care provider. Make sure you discuss any questions you have with your healthcare provider. Document Revised: 03/09/2020 Document Reviewed: 03/09/2020 Elsevier Patient Education  [place] Maintenance, Female Adopting a healthy lifestyle and getting preventive care are important in promoting health and wellness. Ask your health care provider about: The right schedule for you to have regular tests and exams. Things you can do on your own to prevent diseases and keep yourself healthy. What should I know about diet, weight, and exercise? Eat a healthy diet  Eat a diet that includes plenty of vegetables, fruits, low-fat dairy products, and lean  protein. Do not eat a lot of foods that are high in solid fats, added sugars, or sodium.  Maintain a healthy weight Body mass index (BMI) is used to identify weight problems. It estimates body fat based on height and weight. Your health care provider can help determineyour BMI and help you achieve or maintain a healthy weight. Get regular exercise Get regular exercise. This is one of the most important things you can do for your health. Most adults should: Exercise for at least 150 minutes each week. The exercise should increase your heart rate and make you sweat (moderate-intensity exercise). Do strengthening exercises at least twice a week. This is in  addition to the moderate-intensity exercise. Spend less time sitting. Even light physical activity can be beneficial. Watch cholesterol and blood lipids Have your blood tested for lipids and cholesterol at 62 years of age, then havethis test every 5 years. Have your cholesterol levels checked more often if: Your lipid or cholesterol levels are high. You are older than 62 years of age. You are at high risk for heart disease. What should I know about cancer screening? Depending on your health history and family history, you may need to have cancer screening at various ages. This may include screening for: Breast cancer. Cervical cancer. Colorectal cancer. Skin cancer. Lung cancer. What should I know about heart disease, diabetes, and high blood pressure? Blood pressure and heart disease High blood pressure causes heart disease and increases the risk of stroke. This is more likely to develop in people who have high blood pressure readings, are of African descent, or are overweight. Have your blood pressure checked: Every 3-5 years if you are 5-60 years of age. Every year if you are 30 years old or older. Diabetes Have regular diabetes screenings. This checks your fasting blood sugar level. Have the screening done: Once every three years after age 15 if you are at a normal weight and have a low risk for diabetes. More often and at a younger age if you are overweight or have a high risk for diabetes. What should I know about preventing infection? Hepatitis B If you have a higher risk for hepatitis B, you should be screened for this virus. Talk with your health care provider to find out if you are at risk forhepatitis B infection. Hepatitis C Testing is recommended for: Everyone born from [birth year] through 1965. Anyone with known risk factors for hepatitis C. Sexually transmitted infections (STIs) Get screened for STIs, including gonorrhea and chlamydia, if: You are sexually active and are  younger than 62 years of age. You are older than 62 years of age and your health care provider tells you that you are at risk for this type of infection. Your sexual activity has changed since you were last screened, and you are at increased risk for chlamydia or gonorrhea. Ask your health care provider if you are at risk. Ask your health care provider about whether you are at high risk for HIV. Your health care provider may recommend a prescription medicine to help prevent HIV infection. If you choose to take medicine to prevent HIV, you should first get tested for HIV. You should then be tested every 3 months for as long as you are taking the medicine. Pregnancy If you are about to stop having your period (premenopausal) and you may become pregnant, seek counseling before you get pregnant. Take 400 to 800 micrograms (mcg) of folic acid every day if you become pregnant. Ask for birth  control (contraception) if you want to prevent pregnancy. Osteoporosis and menopause Osteoporosis is a disease in which the bones lose minerals and strength with aging. This can result in bone fractures. If you are 11 years old or older, or if you are at risk for osteoporosis and fractures, ask your health care provider if you should: Be screened for bone loss. Take a calcium or vitamin D supplement to lower your risk of fractures. Be given hormone replacement therapy (HRT) to treat symptoms of menopause. Follow these instructions at home: Lifestyle Do not use any products that contain nicotine or tobacco, such as cigarettes, e-cigarettes, and chewing tobacco. If you need help quitting, ask your health care provider. Do not use street drugs. Do not share needles. Ask your health care provider for help if you need support or information about quitting drugs. Alcohol use Do not drink alcohol if: Your health care provider tells you not to drink. You are pregnant, may be pregnant, or are planning to become  pregnant. If you drink alcohol: Limit how much you use to 0-1 drink a day. Limit intake if you are breastfeeding. Be aware of how much alcohol is in your drink. In the [place] one drink equals one 12 oz bottle of beer (355 mL), one 5 oz glass of wine (148 mL), or one 1 oz glass of hard liquor (44 mL). General instructions Schedule regular health, dental, and eye exams. Stay current with your vaccines. Tell your health care provider if: You often feel depressed. You have ever been abused or do not feel safe at home. Summary Adopting a healthy lifestyle and getting preventive care are important in promoting health and wellness. Follow your health care provider's instructions about healthy diet, exercising, and getting tested or screened for diseases. Follow your health care provider's instructions on monitoring your cholesterol and blood pressure. This information is not intended to replace advice given to you by your health care provider. Make sure you discuss any questions you have with your healthcare provider. Document Revised: 07/30/2018 Document Reviewed: 07/30/2018 Elsevier Patient Education  2022 [employer].

## 2021-02-16 NOTE — Progress Notes (Signed)
This AWV is being conducted by [facility] only. The patient was located at home and I was located in [facility]. The patient's identity was confirmed using their DOB and current address. The patient or his/her legal guardian has consented to being evaluated through a telephone encounter and understands the associated risks (an examination cannot be done and the patient may need to come in for an appointment) / benefits (allows the patient to remain at home, decreasing exposure to coronavirus). I personally spent 35 minutes conducting the AWV.  Subjective:   Nicole [name] is a 60 y.o. female who presents for a Medicare Annual Wellness Visit.  The following items have been reviewed and updated today in the appropriate area in the EMR.   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    Employer: UNEMPLOYED    Comment: Unemployed, on disability  Tobacco Use   Smoking status: Some Days    Packs/day: 0.25    Years: 36.00    Pack years: 9.00    Types: Cigarettes    Last attempt to quit: 12/30/2019    Years since quitting: 1.1   Smokeless tobacco: Never   Tobacco comments:    2 cigs per week  Vaping Use   Vaping Use: Never used  Substance and Sexual Activity   Alcohol use: Yes    Alcohol/week: 0.0 standard drinks    Comment: Rare glass of wine   Drug use: Yes    Types: Marijuana    Comment: last use 27 May  21   Sexual activity: Never  Other Topics Concern   Not on file  Social History Narrative   Current Social History 02/16/2021        Patient lives alone in a [place] which is 2 stories. There are 13 steps with handrails up to the entrance the patient uses.       Patient's method of transportation is city bus.      The highest level of education was high school diploma.      The patient currently disabled.      Identified important Relationships are "My mother, my sisters, children, grandchildren (77)"       Pets : None       Interests / Fun: "Read, watch TV, movies, social media, talk on phone, shopping, travel"       Current Stressors: "Simple people get on my nerves,;drama."       Religious / Personal Beliefs: "Christian, [place], Pentecostal."       Other: "I stay away from people."      L. Juan Olthoff, BSN, RN-BC              Social Determinants of Health   Financial Resource Strain: Not on file  Food Insecurity: Food Insecurity Present   Worried About [place] in the Last Year: Sometimes true   Ran Out of Food in the Last Year:  Sometimes true  Transportation Needs: Public librarian (Medical): Yes   Lack of Transportation (Non-Medical): Yes  Physical Activity: Not on file  Stress: Not on file  Social Connections: Not on file  Intimate Partner Violence: Not on file         Objective:    Vitals: LMP 03/03/2016 (Approximate)  Vitals are unable to obtained due to [id number] public health emergency  Activities of Daily Living In your present state of health, do you have any difficulty performing the following activities: 02/16/2021 01/04/2021  Hearing? Y N  Comment Bilat -  Vision? Y N  Comment Cataracts, and glaucoma bilat -  Difficulty concentrating or making decisions? Y N  Walking or climbing stairs? N N  Dressing or bathing? N N  Doing errands, shopping? N N  Some recent data might be hidden    Goals        Exercise 3-4x per week (20-30 min per time) (pt-stated)      Goes to [employer] with Silver [employer]neakers  "Shoots ball, cardio, resistance"       Weight (lb) < 164 lb (74.4 kg) (pt-stated)      7% weight loss goal         Fall Risk Fall Risk  02/16/2021 01/04/2021 03/02/2020 11/25/2019 11/10/2019  Falls in the past year? 1 1 0 0 0  Number falls in past yr: 1 1 - - -  Comment 2 - - - -  Injury with Fall? 1 1 - - -  Comment - TO RIGHT KNEE - - -  Risk for fall due to : History of fall(s) - - - No Fall Risks  Risk for fall due to: Comment Fell over border in grass - - - -  Follow up Education provided;Falls prevention discussed - - - Education provided   [employer] on [employer] and Handout on Home Exercise Program, Access codes [email or web address] and [email or web address] mailed to patient with exercise band.   Depression Screen PHQ 2/9 Scores 02/16/2021 01/04/2021 11/14/2020 07/21/2020  PHQ - 2 Score 1 0 1 1  PHQ- 9 Score 3 - 5 3    Patient stopped seeing her counselor (Dr. Juanda Crumble) at start of Covid. She will call him this week to reestablish care.  Cognitive Testing Six-Item Cognitive Screener   "I would like to ask you some questions that ask you to use your memory. I am going to name three objects. Please wait until I say all three words, then repeat them. Remember what they are  because I am going to ask you to name them again in a few minutes. Please repeat these words for me: APPLE--TABLE--PENNY." (Interviewer may repeat names 3 times if necessary but repetition not scored.)  Did patient correctly repeat all three words? Yes - may proceed with screen  What year is this? Correct What month is this? Correct What day of the week is this? Correct  What were the three objects I asked you to remember? Apple Correct Table Correct Penny Correct  Score one point for each incorrect answer.  A score of 2 or more points warrants additional investigation.  Patient's score 0 Assessment and Plan:    Patient  will call for an appt with her behavioral health counselor to reestablish care She made a 7% weight loss goal (<164 lbs)!! She will consider receiving the Shingrix and pneumonia vaccines. She is not interested in receiving the second pfizer vaccine.  During the course of the  visit the patient was educated and counseled about appropriate screening and preventive services as documented in the assessment and plan.  The printed AVS was given to the patient and included an updated screening schedule, a list of risk factors, and personalized health advice.        Velora Heckler, RN  02/16/2021

## 2021-02-21 NOTE — Progress Notes (Signed)
Encounter reviewed; agree.

## 2021-03-06 ENCOUNTER — Other Ambulatory Visit: Payer: Self-pay

## 2021-03-06 ENCOUNTER — Other Ambulatory Visit: Payer: Self-pay | Admitting: Internal Medicine

## 2021-03-06 DIAGNOSIS — Z1231 Encounter for screening mammogram for malignant neoplasm of breast: Secondary | ICD-10-CM

## 2021-03-09 ENCOUNTER — Other Ambulatory Visit: Payer: Self-pay

## 2021-03-09 ENCOUNTER — Ambulatory Visit
Admission: RE | Admit: 2021-03-09 | Discharge: 2021-03-09 | Disposition: A | Discharge status: Home / Self Care | Payer: Medicare HMO | Source: Ambulatory Visit | Attending: Internal Medicine | Admitting: Internal Medicine

## 2021-03-09 DIAGNOSIS — Z1231 Encounter for screening mammogram for malignant neoplasm of breast: Secondary | ICD-10-CM

## 2021-03-10 ENCOUNTER — Other Ambulatory Visit: Payer: Self-pay | Admitting: Internal Medicine

## 2021-03-10 DIAGNOSIS — N63 Unspecified lump in unspecified breast: Secondary | ICD-10-CM

## 2021-03-13 DIAGNOSIS — R0789 Other chest pain: Secondary | ICD-10-CM | POA: Diagnosis not present

## 2021-03-13 DIAGNOSIS — Z20822 Contact with and (suspected) exposure to covid-19: Secondary | ICD-10-CM | POA: Diagnosis not present

## 2021-03-16 ENCOUNTER — Ambulatory Visit (INDEPENDENT_AMBULATORY_CARE_PROVIDER_SITE_OTHER): Payer: Medicare HMO | Admitting: Internal Medicine

## 2021-03-16 ENCOUNTER — Encounter: Payer: Self-pay | Admitting: Internal Medicine

## 2021-03-16 DIAGNOSIS — N6310 Unspecified lump in the right breast, unspecified quadrant: Secondary | ICD-10-CM

## 2021-03-16 DIAGNOSIS — Z9149 Other personal history of psychological trauma, not elsewhere classified: Secondary | ICD-10-CM

## 2021-03-16 NOTE — Assessment & Plan Note (Signed)
Lesion is darkened round superficial skin thickening. General Surgeon evaluated, she recalls that a cyst was suspected.  She remains fearful.  She is awaiting authorization and scheduling for an ordered diagnostic mammogram and ultrasound.  Lesion does not seem to be in the breast tissue itself, and no underlying thickening is palpable.

## 2021-03-16 NOTE — Patient Instructions (Signed)
Ms. Vanderzwaag, I very much enjoyed meeting you today and look forward to working with you to ehlp you stay healthy.\  We spent most of our time getting to know each other and speaking about the importance of following up on your breast tests.  I'll call and confirm that the appropriate orders are signed and ready for you to schedule your appointments.  I'll see you again in about 6 months for a check up.  This Fall, please make sure that you get your flu shot.  Please take care of yourself.  Thank you for sharing your story today.  Dr. [name]
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
## 2021-03-21 DIAGNOSIS — R062 Wheezing: Secondary | ICD-10-CM | POA: Diagnosis not present

## 2021-03-21 DIAGNOSIS — Z87891 Personal history of nicotine dependence: Secondary | ICD-10-CM | POA: Diagnosis not present

## 2021-03-21 DIAGNOSIS — Z6830 Body mass index (BMI) 30.0-30.9, adult: Secondary | ICD-10-CM | POA: Diagnosis not present

## 2021-03-21 DIAGNOSIS — R0789 Other chest pain: Secondary | ICD-10-CM | POA: Diagnosis not present

## 2021-03-21 DIAGNOSIS — I959 Hypotension, unspecified: Secondary | ICD-10-CM | POA: Diagnosis not present

## 2021-03-21 DIAGNOSIS — F1290 Cannabis use, unspecified, uncomplicated: Secondary | ICD-10-CM | POA: Diagnosis not present

## 2021-03-28 DIAGNOSIS — Z87891 Personal history of nicotine dependence: Secondary | ICD-10-CM | POA: Diagnosis not present

## 2021-03-29 ENCOUNTER — Telehealth: Payer: Self-pay

## 2021-03-29 NOTE — Telephone Encounter (Signed)
Pt is calling to notify her PCP that her husband passed away on 2022/11/28 .. that is all she stated she wanted the message to be

## 2021-04-07 ENCOUNTER — Encounter: Payer: Self-pay | Admitting: Physician Assistant

## 2021-04-07 ENCOUNTER — Ambulatory Visit (INDEPENDENT_AMBULATORY_CARE_PROVIDER_SITE_OTHER): Payer: Medicare HMO | Admitting: Physician Assistant

## 2021-04-07 ENCOUNTER — Other Ambulatory Visit (INDEPENDENT_AMBULATORY_CARE_PROVIDER_SITE_OTHER): Payer: Medicare HMO

## 2021-04-07 VITALS — BP 124/72 | HR 80 | Ht 65.5 in | Wt 179.0 lb

## 2021-04-07 DIAGNOSIS — R1915 Other abnormal bowel sounds: Secondary | ICD-10-CM

## 2021-04-07 DIAGNOSIS — R1031 Right lower quadrant pain: Secondary | ICD-10-CM | POA: Diagnosis not present

## 2021-04-07 DIAGNOSIS — R140 Abdominal distension (gaseous): Secondary | ICD-10-CM

## 2021-04-07 LAB — CBC WITH DIFFERENTIAL/PLATELET
Basophils Absolute: 0 10*3/uL (ref 0.0–0.1)
Basophils Relative: 0.9 % (ref 0.0–3.0)
Eosinophils Absolute: 0.2 10*3/uL (ref 0.0–0.7)
Eosinophils Relative: 4.2 % (ref 0.0–5.0)
HCT: 37.9 % (ref 36.0–46.0)
Hemoglobin: 12.6 g/dL (ref 12.0–15.0)
Lymphocytes Relative: 49 % — ABNORMAL HIGH (ref 12.0–46.0)
Lymphs Abs: 2.2 10*3/uL (ref 0.7–4.0)
MCHC: 33.4 g/dL (ref 30.0–36.0)
MCV: 93.1 fl (ref 78.0–100.0)
Monocytes Absolute: 0.4 10*3/uL (ref 0.1–1.0)
Monocytes Relative: 8.9 % (ref 3.0–12.0)
Neutro Abs: 1.6 10*3/uL (ref 1.4–7.7)
Neutrophils Relative %: 37 % — ABNORMAL LOW (ref 43.0–77.0)
Platelets: 222 10*3/uL (ref 150.0–400.0)
RBC: 4.07 Mil/uL (ref 3.87–5.11)
RDW: 13.9 % (ref 11.5–15.5)
WBC: 4.5 10*3/uL (ref 4.0–10.5)

## 2021-04-07 LAB — C-REACTIVE PROTEIN: CRP: <1 mg/dL (ref 0.5–20.0)

## 2021-04-07 LAB — COMPREHENSIVE METABOLIC PANEL
ALT: 16 U/L (ref 0–35)
AST: 15 U/L (ref 0–37)
Albumin: 4.6 g/dL (ref 3.5–5.2)
Alkaline Phosphatase: 65 U/L (ref 39–117)
BUN: 13 mg/dL (ref 6–23)
CO2: 25 mEq/L (ref 19–32)
Calcium: 9.4 mg/dL (ref 8.4–10.5)
Chloride: 104 mEq/L (ref 96–112)
Creatinine, Ser: 0.86 mg/dL (ref 0.40–1.20)
GFR: 72.78 mL/min (ref >60.00)
Glucose, Bld: 78 mg/dL (ref 70–99)
Potassium: 3.9 mEq/L (ref 3.5–5.1)
Sodium: 139 mEq/L (ref 135–145)
Total Bilirubin: 0.6 mg/dL (ref 0.2–1.2)
Total Protein: 7.3 g/dL (ref 6.0–8.3)

## 2021-04-07 LAB — SEDIMENTATION RATE: Sed Rate: 22 mm/hr (ref 0–30)

## 2021-04-07 NOTE — Progress Notes (Signed)
Subjective:    Patient ID: Nicole [name], female    DOB: [birth year], 60 y.o.   MRN: [id number]  HPI Nicole [name] is a pleasant 60 year old female, established with Dr. Carlean Purl, who was last seen in June 2021 when she underwent colonoscopy. She comes in today with complaints of abdominal bloating, distention and a distinct increase in noise in her abdomen with a lot of loud gurgling over the past 2 months which she has never had in the past.  She denies any excessive belching or flatus, no nausea or vomiting.  She has been having bowel movements daily, no melena or hematochezia.  She mentions that she has been eating a lot of nuts at nighttime and feels that this helps her bowel movements. She is concerned as she has a friend who had to have a recent surgery for an intestinal issue, and has a relative who had cancer which was diagnosed at a late stage.  Colonoscopy June 2021 with a diminutive sessile polyp in the transverse colon which was a tubular adenoma, otherwise negative exam and indicated for follow-up 2029. She is status post exploratory lap and bilateral tubal ligation History of sleep apnea and bipolar disorder.  Review of Systems Pertinent positive and negative review of systems were noted in the above HPI section.  All other review of systems was otherwise negative.   Current Medications, Allergies, Past Medical History, Past Surgical History, Family History and Social History were reviewed in [employer] record.   Outpatient Encounter Medications as of 04/07/2021  Medication Sig   amoxicillin (AMOXIL) 500 MG capsule Take 500 mg by mouth 2 (two) times daily.   Ascorbic Acid (VITAMIN C) 100 MG tablet Take 100 mg by mouth daily.   Cholecalciferol (VITAMIN D3 PO) Take by mouth.   Cyanocobalamin (VITAMIN B-12 PO) Take by mouth.   No facility-administered encounter medications on file as of 04/07/2021.   Allergies  Allergen Reactions   Other Itching and Hives    Pain  medication ordered at [facility] in 2009 - Patient does not know name of medication  Pain medication given at [facility] approximately 2009 Pain medication ordered at [facility] in 2009 - Patient does not know name of medication  Pain medication given at [facility] approximately 2009   Patient Active Problem List   Diagnosis Date Noted   Other personal history of psychological trauma, not elsewhere classified 03/16/2021   Mass of soft tissue of abdomen 01/05/2021   Lump of right breast 01/04/2021   History of hyperlipidemia 11/10/2019   Overweight 09/23/2019   History of recurrent UTI (urinary tract infection) 11/12/2017   Marijuana use 11/12/2017   OSA (obstructive sleep apnea) 12/27/2015   Bipolar disorder ([place]) 04/14/2007   Social History   Socioeconomic History   Marital status: Single    Spouse name: Not on file   Number of children: 3   Years of education: Not on file   Highest education level: Not on file  Occupational History   Not on file  Tobacco Use   Smoking status: Some Days    Packs/day: 0.25    Years: 36.00    Pack years: 9.00    Types: Cigarettes    Last attempt to quit: 12/30/2019    Years since quitting: 1.2   Smokeless tobacco: Never   Tobacco comments:    2 cigs per week  Vaping Use   Vaping Use: Never used  Substance and Sexual Activity  Alcohol use: Yes    Alcohol/week: 0.0 standard drinks    Comment: Rare glass of wine   Drug use: Yes    Types: Marijuana    Comment: daily, smokes it   Sexual activity: Never  Other Topics Concern   Not on file  Social History Narrative   Current Social History 02/16/2021        Patient lives alone in a [place] which is 2 stories. There are 13 steps with handrails up to the entrance the patient uses.       Patient's method of transportation is city bus.      The highest level of education was high school diploma.      The patient currently disabled.      Identified important  Relationships are "My mother, my sisters, children, grandchildren (39)"       Pets : None       Interests / Fun: "Read, watch TV, movies, social media, talk on phone, shopping, travel"       Current Stressors: "Simple people get on my nerves,;drama."       Religious / Personal Beliefs: "Christian, [place], Pentecostal."       Other: "I stay away from people."      L. [place], BSN, RN-BC              Social Determinants of Health   Financial Resource Strain: Not on file  Food Insecurity: Food Insecurity Present   Worried About Charity fundraiser in the Last Year: Sometimes true   Arboriculturist in the Last Year: Sometimes true  Transportation Needs: Unmet Transportation Needs   Lack of Transportation (Medical): Yes   Lack of Transportation (Non-Medical): Yes  Physical Activity: Not on file  Stress: Not on file  Social Connections: Not on file  Intimate Partner Violence: Not on file    Nicole [name]'s family history includes Diabetes in her brother, father, mother, sister, sister, and sister; Glaucoma in her mother; Heart attack in her maternal grandfather, maternal grandmother, and mother; Heart disease in her mother; Hypertension in her brother, mother, sister, sister, and sister; Mental illness in her son and son; Pancreatic cancer in her maternal uncle and another family member; Thyroid disease in her mother.      Objective:    Vitals:   04/07/21 1004  BP: 124/72  Pulse: 80    Physical Exam Well-developed well-nourished older African-American female in no acute distress.  Weight, 179 BMI 29.3  HEENT; nontraumatic normocephalic, EOMI, PE R LA, sclera anicteric. Oropharynx; not examined today Neck; supple, no JVD Cardiovascular; regular rate and rhythm with S1-S2, no murmur rub or gallop Pulmonary; Clear bilaterally Abdomen; soft, she is tender in the right mid and right lower quadrant, no guarding or rebound nondistended, no palpable mass or hepatosplenomegaly, bowel  sounds are active ,, small umbilical hernia Rectal; not done Skin; benign exam, no jaundice rash or appreciable lesions Extremities; no clubbing cyanosis or edema skin warm and dry Neuro/Psych; alert and oriented x4, grossly nonfocal mood and affect appropriate        Assessment & Plan:   #41 60 year old African-American female with 51-month history of abdominal bloating, distention and abnormal bowel sounds with loud gurgling/borborygmi. She is tender in the right lower quadrant on exam, raising question of intestinal inflammatory process, rule out occult neoplasm.,  Rule out low-grade obstructive symptoms.  #2 history of adenomatous colon polyps-up-to-date with colonoscopy last done June 2021 and indicated for follow-up 2029  #  3 bipolar disorder #4.  Sleep apnea #5 small umbilical hernia  Plan; CBC with differential, c-Met, sed rate, CRP Patient will be scheduled for CT of the abdomen and pelvis with contrast. Further recommendations pending findings at CT.   Nicole [name] S Ysmael Hires PA-C 04/07/2021   Cc: [name], MD

## 2021-04-07 NOTE — Patient Instructions (Signed)
If you are age 62 or younger, your body mass index should be between 19-25. Your There is no height or weight on file to calculate BMI. If this is out of the aformentioned range listed, please consider follow up with your Primary Care Provider.  __________________________________________________________  The [facility] providers would like to encourage you to use [facility] to communicate with providers for non-urgent requests or questions.  Due to long hold times on the telephone, sending your provider a message by [facility] may be a faster and more efficient way to get a response.  Please allow 48 business hours for a response.  Please remember that this is for non-urgent requests.   Your provider has requested that you go to the basement level for lab work before leaving today. Press "B" on the elevator. The lab is located at the first door on the left as you exit the elevator.  You have been scheduled for a CT scan of the abdomen and pelvis at [facility] ([address]---this is in the same building as [employer]).   You are scheduled on 04/13/2021 at 3:30 pm. You should arrive 15 minutes prior to your appointment time for registration. Please follow the written instructions below on the day of your exam:  WARNING: IF YOU ARE ALLERGIC TO IODINE/X-RAY DYE, PLEASE NOTIFY RADIOLOGY IMMEDIATELY AT [phone]! YOU WILL BE GIVEN A 13 HOUR PREMEDICATION PREP.  1) Do not eat after 11:30 am (4 hours prior to your test). Please drink plenty of water prior. 2) You have been given 2 bottles of oral contrast to drink. The solution may taste better if refrigerated, but do NOT add ice or any other liquid to this solution. Shake well before drinking.    Drink 1 bottle of contrast @ 1:30 pm (2 hours prior to your exam)  Drink 1 bottle of contrast @ 2:30 pm (1 hour prior to your exam)  You may take any medications as prescribed with a small amount of water, if necessary. If you take any  of the following medications: METFORMIN, GLUCOPHAGE, GLUCOVANCE, AVANDAMET, RIOMET, FORTAMET, Justice MET, JANUMET, GLUMETZA or METAGLIP, you MAY be asked to HOLD this medication 48 hours AFTER the exam.  The purpose of you drinking the oral contrast is to aid in the visualization of your intestinal tract. The contrast solution may cause some diarrhea. Depending on your individual set of symptoms, you may also receive an intravenous injection of x-ray contrast/dye. Plan on being at [facility] for 30 minutes or longer, depending on the type of exam you are having performed.  This test typically takes 30-45 minutes to complete.  If you have any questions regarding your exam or if you need to reschedule, you may call the CT department at [phone] between the hours of 8:00 am and 5:00 pm, Monday-Friday.  ____________________________________________________________  Follow up pending the results of your CT and Labs.  Thank you for entrusting me with your care and choosing [facility].  Amy Esterwood, PA-C

## 2021-04-10 ENCOUNTER — Other Ambulatory Visit: Payer: Self-pay

## 2021-04-10 ENCOUNTER — Ambulatory Visit: Payer: Medicare HMO | Admitting: Behavioral Health

## 2021-04-10 DIAGNOSIS — F419 Anxiety disorder, unspecified: Secondary | ICD-10-CM

## 2021-04-10 DIAGNOSIS — F331 Major depressive disorder, recurrent, moderate: Secondary | ICD-10-CM
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
## 2021-04-11 ENCOUNTER — Ambulatory Visit: Payer: Medicare HMO | Attending: Adult Health | Admitting: Physical Therapy

## 2021-04-11 ENCOUNTER — Encounter: Payer: Self-pay | Admitting: Physical Therapy

## 2021-04-11 ENCOUNTER — Telehealth: Payer: Self-pay

## 2021-04-11 ENCOUNTER — Other Ambulatory Visit: Payer: Self-pay

## 2021-04-11 DIAGNOSIS — R252 Cramp and spasm: Secondary | ICD-10-CM | POA: Diagnosis not present

## 2021-04-11 DIAGNOSIS — M6281 Muscle weakness (generalized): Secondary | ICD-10-CM | POA: Insufficient documentation

## 2021-04-11 NOTE — Telephone Encounter (Signed)
Called and advised patient that her CT would now be at [facility]. She is still scheduled for this Thursday, 8/25 at 5:30. She understands the changes in times and location for her directions.

## 2021-04-11 NOTE — Therapy (Signed)
[facility] [address], [place], [place], [address] Phone: [phone]   Fax:  [phone]  Physical Therapy Evaluation  Patient Details  Name: Nicole [name] MRN: [id number] Date of Birth: [birth year] Referring Provider (PT): Hollace Hayward, NP   Encounter Date: 04/11/2021   PT End of Session - 04/11/21 1439     Visit Number 1    Date for PT Re-Evaluation 07/04/21    Authorization Type [employer] - SUBMITTING REQUEST TODAY FOR 8 VISITS    PT Start Time 1440    PT Stop Time 1523    PT Time Calculation (min) 43 min    Activity Tolerance Patient tolerated treatment well    Behavior During Therapy [employer] for tasks assessed/performed             Past Medical History:  Diagnosis Date   Acute pain of right knee 01/04/2021   Acute pain of right shoulder 01/04/2021   Anemia    Anxiety    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 Verl Blalock 2/09   Reflux esophagitis    Sleep apnea    No CPAP   Vaginal discharge 03/02/2020    Past Surgical History:  Procedure Laterality Date   COLONOSCOPY     ECTOPIC PREGNANCY SURGERY     EXPLORATORY LAPAROTOMY  2000   x2 (Dr. Kendall Flack)   [place] VIRUS DFA     TUBAL LIGATION  1987    There were no vitals filed for this visit.    Subjective Assessment - 04/11/21 1444     Subjective Pt states she has to put perineal pressure to have a BM and strains to pee.  I feel like everything is weak  so I need to press to get things out.  I strain so hard to pee that it hurts.  Having sex is painful but I can deal with that pain but there is blood afterwards.    Patient Stated Goals empty bladder and have  BMs without straining and strengthen core    Currently in Pain? No/denies                [facility] PT Assessment - 04/11/21 0001       Assessment   Medical Diagnosis R30.0 (ICD-10-CM) - Dysuria    Referring Provider (PT) Hollace Hayward, NP    Prior Therapy No      Precautions   Precautions None      Balance Screen   Has the patient fallen in the past 6 months Yes    How many times? 2   running across the street; walking on uneven curb and tripped   Has the patient had a decrease in activity level because of a fear of falling?  No    Is the patient reluctant to leave their home because of a fear of falling?  No      Home Environment   Living Environment Private residence    Living Arrangements Alone      Prior Function   Level of Independence Independent    Vocation Unemployed    Leisure walk, dance, music  Cognition   Overall Cognitive Status Within Functional Limits for tasks assessed      Functional Tests   Functional tests Single leg stance      Single Leg Stance   Comments trendelenburg bil      Posture/Postural Control   Posture/Postural Control Postural limitations    Postural Limitations Flexed trunk;Anterior pelvic tilt;Right pelvic obliquity    Posture Comments pronation of bil medial arch of feet Rt>Lt      ROM / Strength   AROM / PROM / Strength AROM;PROM;Strength      AROM   Overall AROM Comments lumbar 50% - states she can usually bend all the way but feeling tight today      PROM   Overall PROM Comments hip ER 50%      Strength   Overall Strength Comments hip abduction 4/5      Flexibility   Soft Tissue Assessment /Muscle Length yes    Hamstrings Rt 60%; Lt 70%      Palpation   Palpation comment lumbar tight; abdominal wall weakness      Special Tests   Other special tests ASLR improved with pelvic compression and multifidi support      Ambulation/Gait   Gait Pattern Within Functional Limits                         Objective measurements completed on examination: See above findings.     Pelvic Floor Special Questions - 04/11/21 0001     Prior Pelvic/Prostate Exam Yes    Are you Pregnant or attempting pregnancy? Yes    Prior Pregnancies Yes    Number of Pregnancies 3    Currently Sexually Active Yes    Is this Painful Yes    Marinoff Scale pain interrupts completion    Urinary Leakage Yes    How often only if I hold it long I have some leakage and some difficulty    Pad use panty liners when going out    Urinary urgency Yes    Urinary frequency depends on water intake    Fecal incontinence --   constipation   Fluid intake 24 oz/day at most    Falling out feeling (prolapse) --   pain when straining to void but does not feel prolapse normally   External Perineal Exam assessed with underwear on - palpation has excessive TTP transverse peroneus and TP very tight elevated peroneal body    Exam Type Deferred               No emotional/communication barriers or cognitive limitation. Patient is motivated to learn. Patient understands and agrees with treatment goals and plan. PT explains patient will be examined in standing, sitting, and lying down to see how their muscles and joints work. When they are ready, they will be asked to remove their underwear so PT can examine their perineum. The patient is also given the option of providing their own chaperone as one is not provided in our facility. The patient also has the right and is explained the right to defer or refuse any part of the evaluation or treatment including the internal exam. With the patient's consent, PT will use one gloved finger to gently assess the muscles of the pelvic floor, seeing how well it contracts and relaxes and if there is muscle symmetry. After, the patient will get dressed and PT and patient will discuss exam findings and plan of care. PT and patient discuss  plan of care, schedule, attendance policy  and HEP activities.          PT Short Term Goals - 04/11/21 1700       PT SHORT TERM GOAL #1   Title ind with toileting techniques    Time 4    Period Weeks    Status New    Target Date 05/09/21      PT SHORT TERM GOAL #2   Title Pt will be able to tolerate internal pelvic assessment due to improved ability to relax pelvic floor muscles    Time 4    Period Weeks    Status New    Target Date 05/09/21               PT Long Term Goals - 04/11/21 1648       PT LONG TERM GOAL #1   Title Pt will be able to void without straining to prevent increased risk of prolapse    Time 12    Period Weeks    Status New    Target Date 07/04/21      PT LONG TERM GOAL #2   Title Pt will be ind with advanced HEP    Time 12    Period Weeks    Status New    Target Date 07/04/21      PT LONG TERM GOAL #3   Title Pt will be able to have intercouse with 1/3 [place] at most    Baseline 3/3    Time 12    Period Weeks    Status New    Target Date 07/04/21      PT LONG TERM GOAL #4   Title Pt will be able to perform SLS for 10 sec with minimal perturbations and no UE support for reduced risk of falls    Time 12    Period Weeks    Status New    Target Date 07/04/21                    Plan - 04/11/21 1524     Clinical Impression Statement Pt presents to skilled PT due to pain and difficutly with BMs and voiding.  Pt has very tight transverse peroneus and elevated perineal body that was assessed exteranlly. Significant co-contraction with attempt at doing a kegel.  Pt deferred a pelvic assessment done internally today due to pain and was bleeding after having intercourse yesterday.  Pt is also experiencing some falls but is not worried about her activities.  Single leg standing was assessed to 8 seconds on Rt LE and 10 seconds on Lt but very unsteady with Trendelenburg bil. Pt has positive ASLR that was improved with pelvic compression and lumbar multifidi support.  Pt has  tight hamstring and tight hip ER bil.  Pt has lumbar flexion decreased by 50%.  Pt will benefit from skilled PT to address impairments, complete pelvic assessment, and help manage pain so she can perform regular functional activities without pain.    Personal Factors and Comorbidities Time since onset of injury/illness/exacerbation;Comorbidity 1;Comorbidity 3+    Comorbidities 3 prior pregnancies, PTSD, bipolar    Examination-Activity Limitations Toileting    Examination-Participation Restrictions Interpersonal Relationship    Stability/Clinical Decision Making Evolving/Moderate complexity    Clinical Decision Making Moderate    Rehab Potential Excellent    PT Frequency 1x / week    PT Duration 12 weeks    PT Treatment/Interventions ADLs/Self Care Home Management;Biofeedback;Cryotherapy;Electrical Stimulation;Moist Heat;Therapeutic  activities;Therapeutic exercise;Neuromuscular re-education;Manual techniques;Passive range of motion;Dry needling;Taping;Patient/family education    PT Next Visit Plan stretches and  bulging, toileting technique (f/u on squatty potty), internal assessment    Consulted and Agree with Plan of Care Patient             Patient will benefit from skilled therapeutic intervention in order to improve the following deficits and impairments:  Pain, Decreased coordination, Postural dysfunction, Increased muscle spasms, Decreased strength  Visit Diagnosis: Muscle weakness (generalized)  Cramp and spasm     Problem List Patient Active Problem List   Diagnosis Date Noted   Other personal history of psychological trauma, not elsewhere classified 03/16/2021   Mass of soft tissue of abdomen 01/05/2021   Lump of right breast 01/04/2021   History of hyperlipidemia 11/10/2019   Overweight 09/23/2019   History of recurrent UTI (urinary tract infection) 11/12/2017   Marijuana use 11/12/2017   OSA (obstructive sleep apnea) 12/27/2015   Bipolar disorder ([place]) 04/14/2007     Jule Ser, PT 04/11/2021, 5:02 PM  [facility] [address], [place], [place], [address] Phone: [phone]   Fax:  [phone]  Name: Nicole [name] MRN: [id number] Date of Birth: June 13, 1961

## 2021-04-13 ENCOUNTER — Inpatient Hospital Stay: Admission: RE | Admit: 2021-04-13 | Payer: Medicare HMO | Source: Ambulatory Visit

## 2021-04-13 ENCOUNTER — Ambulatory Visit (HOSPITAL_COMMUNITY)
Admission: RE | Admit: 2021-04-13 | Discharge: 2021-04-13 | Disposition: A | Discharge status: Home / Self Care | Payer: Medicare HMO | Source: Ambulatory Visit | Attending: Physician Assistant | Admitting: Physician Assistant

## 2021-04-13 ENCOUNTER — Telehealth: Payer: Self-pay | Admitting: Physician Assistant

## 2021-04-13 ENCOUNTER — Other Ambulatory Visit: Payer: Self-pay

## 2021-04-13 DIAGNOSIS — R1915 Other abnormal bowel sounds: Secondary | ICD-10-CM | POA: Diagnosis not present

## 2021-04-13 DIAGNOSIS — R140 Abdominal distension (gaseous): Secondary | ICD-10-CM | POA: Insufficient documentation

## 2021-04-13 DIAGNOSIS — R1031 Right lower quadrant pain: Secondary | ICD-10-CM | POA: Diagnosis not present

## 2021-04-13 MED ORDER — IOHEXOL 350 MG/ML SOLN
80.0000 mL | Freq: Once | INTRAVENOUS | Status: AC | PRN
  Administered 2021-04-13: 80 mL via INTRAVENOUS

## 2021-04-13 NOTE — Telephone Encounter (Signed)
Inbound call from patient. Requesting a call for about her CT scan 8/25 @ 5:30. Need to know the instructions and times for the prep.

## 2021-04-13 NOTE — Telephone Encounter (Signed)
Called and Spoke with patient to advise of her check in time at [facility]. Advised to stop eating at 1:30 pm and to take contrast at 3:30 and 4:30.

## 2021-04-17 ENCOUNTER — Encounter: Payer: Self-pay | Admitting: Physical Therapy

## 2021-04-17 ENCOUNTER — Ambulatory Visit: Payer: Medicare HMO | Admitting: Physical Therapy

## 2021-04-17 ENCOUNTER — Other Ambulatory Visit: Payer: Self-pay

## 2021-04-17 DIAGNOSIS — R252 Cramp and spasm: Secondary | ICD-10-CM | POA: Diagnosis not present

## 2021-04-17 DIAGNOSIS — M6281 Muscle weakness (generalized): Secondary | ICD-10-CM | POA: Diagnosis not present

## 2021-04-17 NOTE — Patient Instructions (Addendum)
Toileting Techniques for Bowel Movements (Defecation) Using your belly (abdomen) and pelvic floor muscles to have a bowel movement is usually instinctive.  Sometimes people can have problems with these muscles and have to relearn proper defecation (emptying) techniques.  If you have weakness in your muscles, organs that are falling out, decreased sensation in your pelvis, or ignore your urge to go, you may find yourself straining to have a bowel movement.  You are straining if you are: holding your breath or taking in a huge gulp of air and holding it  keeping your lips and jaw tensed and closed tightly turning red in the face because of excessive pushing or forcing developing or worsening your  hemorrhoids getting faint while pushing not emptying completely and have to defecate many times a day  If you are straining, you are actually making it harder for yourself to have a bowel movement.  Many people find they are pulling up with the pelvic floor muscles and closing off instead of opening the anus. Due to lack pelvic floor relaxation and coordination the abdominal muscles, one has to work harder to push the feces out.  Many people have never been taught how to defecate efficiently and effectively.  Notice what happens to your body when you are having a bowel movement.  While you are sitting on the toilet pay attention to the following areas: Jaw and mouth position Angle of your hips   Whether your feet touch the ground or not Arm placement  Spine position Waist Belly tension Anus (opening of the anal canal)  An Evacuation/Defecation Plan   Here are the 4 basic points:  Lean forward enough for your elbows to rest on your knees Support your feet on the floor or use a low stool if your feet don't touch the floor  Push out your belly as if you have swallowed a beach ball--you should feel a widening of your waist Open and relax your pelvic floor muscles, rather than tightening around the  anus       The following conditions my require modifications to your toileting posture:  If you have had surgery in the past that limits your back, hip, pelvic, knee or ankle flexibility Constipation   Your healthcare practitioner may make the following additional suggestions and adjustments:  Sit on the toilet  a) Make sure your feet are supported. b) Notice your hip angle and spine position--most people find it effective to lean forward or raise their knees, which can help the muscles around the anus to relax  c) When you lean forward, place your forearms on your thighs for support  Relax suggestions a) Breath deeply in through your nose and out slowly through your mouth as if you are smelling the flowers and blowing out the candles. b) To become aware of how to relax your muscles, contracting and releasing muscles can be helpful.  Pull your pelvic floor muscles in tightly by using the image of holding back gas, or closing around the anus (visualize making a circle smaller) and lifting the anus up and in.  Then release the muscles and your anus should drop down and feel open. Repeat 5 times ending with the feeling of relaxation. c) Keep your pelvic floor muscles relaxed; let your belly bulge out. d) The digestive tract starts at the mouth and ends at the anal opening, so be sure to relax both ends of the tube.  Place your tongue on the roof of your mouth with your teeth  separated.  This helps relax your mouth and will help to relax the anus at the same time.  Empty (defecation) a) Keep your pelvic floor and sphincter relaxed, then bulge your anal muscles.  Make the anal opening wide.  b) Stick your belly out as if you have swallowed a beach ball. c) Make your belly wall hard using your belly muscles while continuing to breathe. Doing this makes it easier to open your anus. d) Breath out and give a grunt (or try using other sounds such as ahhhh, shhhhh, ohhhh or  grrrrrrr).  4) Finish a) As you finish your bowel movement, pull the pelvic floor muscles up and in.  This will leave your anus in the proper place rather than remaining pushed out and down. If you leave your anus pushed out and down, it will start to feel as though that is normal and give you incorrect signals about needing to have a bowel movement.  Access Code: [email or web address] URL: [email or web address] Date: 04/17/2021 Prepared by: Jari Favre  Exercises Supine Figure 4 Piriformis Stretch - 1 x daily - 7 x weekly - 1 sets - 3 reps - 30 sec hold Supine Piriformis Stretch with Foot on Ground - 1 x daily - 7 x weekly - 1 sets - 3 reps - 30 sec hold Supine Single Knee to Chest Stretch - 2 x daily - 7 x weekly - 1 sets - 5 reps - 10 sec hold Supine Pelvic Floor Stretch - 1 x daily - 7 x weekly - 1 sets - 3 reps - 30 sec hold Supine Piriformis Stretch with Leg Straight - 1 x daily - 7 x weekly - 1 sets - 3 reps - 30 sec hold Seated Hamstring Stretch - 1 x daily - 7 x weekly - 1 sets - 3 reps - 30 sec hold

## 2021-04-17 NOTE — Therapy (Addendum)
[facility] [address] [place], [place], [address] Phone: [phone]   Fax:  [phone]  Physical Therapy Treatment  Patient Details  Name: Nicole [name] MRN: [id number] Date of Birth: 10/13/1960 Referring Provider (PT): Hollace Hayward, NP   Encounter Date: 04/17/2021   PT End of Session - 04/17/21 1435     Visit Number 2    Date for PT Re-Evaluation 07/04/21    Authorization Type [employer] - 07/04/21 ; 8 visits    PT Start Time 1448    PT Stop Time 1526    PT Time Calculation (min) 38 min    Activity Tolerance Patient tolerated treatment well    Behavior During Therapy [facility] for tasks assessed/performed             Past Medical History:  Diagnosis Date   Acute pain of right knee 01/04/2021   Acute pain of right shoulder 01/04/2021   Anemia    Anxiety    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 Verl Blalock 2/09   Reflux esophagitis    Sleep apnea    No CPAP   Vaginal discharge 03/02/2020    Past Surgical History:  Procedure Laterality Date   COLONOSCOPY     ECTOPIC PREGNANCY SURGERY     EXPLORATORY LAPAROTOMY  2000   x2 (Dr. Kendall Flack)   [place] VIRUS DFA     [place]    There were no vitals filed for this visit.   Subjective Assessment - 04/17/21 1451     Subjective Pt denies pain currently and used cream for UTI. She would like to skip internal assessment today due to feeling sore  earlier today. Pt states she had 1/2 glass of water so far today.  Pt has not gotten squatty potty yet.    Patient Stated Goals empty bladder and have BMs without straining and strengthen core    Currently in Pain?  No/denies                               [facility] Adult PT Treatment/Exercise - 04/17/21 0001       Self-Care   Self-Care Other Self-Care Comments    Other Self-Care Comments  toileting techniques      Exercises   Exercises Lumbar      Lumbar Exercises: Stretches   Active Hamstring Stretch Right;Left;30 seconds    Single Knee to Chest Stretch 2 reps;30 seconds    Double Knee to Chest Stretch 2 reps;30 seconds    Lower Trunk Rotation 3 reps;30 seconds    Prone on Elbows Stretch 2 reps;30 seconds    Piriformis Stretch Right;Left;2 reps;20 seconds    Figure 4 Stretch 2 reps;30 seconds;With overpressure    Other Lumbar Stretch Exercise breathing with stretches                    PT Education - 04/17/21 1530     Education Details Access Code: [email or web address]    Person(s) Educated Patient    Methods Explanation;Demonstration;Tactile cues;Verbal cues;Handout    Comprehension  Verbalized understanding;Returned demonstration              PT Short Term Goals - 04/17/21 1512       PT SHORT TERM GOAL #1   Title ind with toileting techniques    Baseline not using squatty potty      PT SHORT TERM GOAL #2   Title Pt will be able to tolerate internal pelvic assessment due to improved ability to relax pelvic floor muscles    Baseline still unable to tolerate today    Status On-going               PT Long Term Goals - 04/11/21 1648       PT LONG TERM GOAL #1   Title Pt will be able to void without straining to prevent increased risk of prolapse    Time 12    Period Weeks    Status New    Target Date 07/04/21      PT LONG TERM GOAL #2   Title Pt will be ind with advanced HEP    Time 12    Period Weeks    Status New    Target Date 07/04/21      PT LONG TERM GOAL #3   Title Pt will be able to have intercouse with 1/3 [place] at most    Baseline 3/3    Time 12    Period Weeks    Status New    Target Date 07/04/21      PT LONG TERM GOAL  #4   Title Pt will be able to perform SLS for 10 sec with minimal perturbations and no UE support for reduced risk of falls    Time 12    Period Weeks    Status New    Target Date 07/04/21                   Plan - 04/17/21 1520     Clinical Impression Statement Pt has not met goals yet due to initial eval and having some issues that she did not want to do internal.  Pt was educated on toileting techniques and stretches for intial HEP.  Pt needs to continue skilled PT to work on goals as stated.    PT Treatment/Interventions ADLs/Self Care Home Management;Biofeedback;Cryotherapy;Electrical Stimulation;Moist Heat;Therapeutic activities;Therapeutic exercise;Neuromuscular re-education;Manual techniques;Passive range of motion;Dry needling;Taping;Patient/family education    PT Next Visit Plan f/u on intial HEP and toileting; internal assessed if able; abdominal fascial release if not tolerated and external STM    PT Home Exercise Plan Access Code: [email or web address]    Consulted and Agree with Plan of Care Patient             Patient will benefit from skilled therapeutic intervention in order to improve the following deficits and impairments:  Pain, Decreased coordination, Postural dysfunction, Increased muscle spasms, Decreased strength  Visit Diagnosis: Muscle weakness (generalized)  Cramp and spasm     Problem List Patient Active Problem List   Diagnosis Date Noted   Other personal history of psychological trauma, not elsewhere classified 03/16/2021   Mass of soft tissue of abdomen 01/05/2021   Lump of right breast 01/04/2021   History of hyperlipidemia 11/10/2019   Overweight 09/23/2019   History of recurrent UTI (urinary tract infection) 11/12/2017   Marijuana use 11/12/2017   OSA (obstructive sleep apnea) 12/27/2015   Bipolar disorder ([place]) 04/14/2007    Camillo Flaming Zackrey Dyar, PT 04/17/2021, 4:54 PM  [facility] [address], [place], [place], [address] Phone: [phone]   Fax:  [phone]  Name: JACKSON FETTERS MRN: [id number] Date of Birth: Jun 03, 1961   PHYSICAL THERAPY DISCHARGE SUMMARY  Visits from Start of Care: 2  Current functional level related to goals / functional outcomes:   See above goals Remaining deficits: See above goals   Education / Equipment: HEP  Patient agrees to discharge. Patient goals were not met. Patient is being discharged due to not returning since the last visit.  Gustavus Bryant, PT 05/02/21 12:28 PM

## 2021-04-18 DIAGNOSIS — U071 COVID-19: Secondary | ICD-10-CM | POA: Diagnosis not present

## 2021-04-19 ENCOUNTER — Telehealth: Payer: Self-pay | Admitting: Physician Assistant

## 2021-04-19 NOTE — Telephone Encounter (Signed)
Inbound call from patient requesting a call from a nurse in regards to CT scan results please.

## 2021-04-20 NOTE — Telephone Encounter (Signed)
Called the patient. No answer. Left a message. Amy Esterwood, PA is in the office today. She will review the imaging and give her impression and recommendations soon. I will call her with that information. Thanked her for being patient.

## 2021-04-25 ENCOUNTER — Telehealth: Payer: Self-pay | Admitting: Physical Therapy

## 2021-04-25 ENCOUNTER — Other Ambulatory Visit: Payer: Medicare HMO

## 2021-04-25 ENCOUNTER — Ambulatory Visit: Payer: Medicare HMO | Attending: Adult Health | Admitting: Physical Therapy

## 2021-04-25 DIAGNOSIS — M6281 Muscle weakness (generalized): Secondary | ICD-10-CM | POA: Insufficient documentation

## 2021-04-25 DIAGNOSIS — R252 Cramp and spasm: Secondary | ICD-10-CM | POA: Insufficient documentation

## 2021-04-25 NOTE — Telephone Encounter (Signed)
Pt called and number did not work.  Today was first no show  [employer], PT 04/25/21 12:49 PM

## 2021-04-26 DIAGNOSIS — U071 COVID-19: Secondary | ICD-10-CM | POA: Diagnosis not present

## 2021-05-01 ENCOUNTER — Ambulatory Visit: Payer: Medicare HMO | Admitting: Physical Therapy

## 2021-05-04 ENCOUNTER — Telehealth: Payer: Self-pay | Admitting: Behavioral Health

## 2021-05-04 ENCOUNTER — Ambulatory Visit: Payer: Medicare HMO | Admitting: Behavioral Health

## 2021-05-04 NOTE — Telephone Encounter (Signed)
Could not reach Pt for today's telehealth visit @ 1:00pm.  Unable to lv msg as no VM available. Phone cont'd to ring until signal was busy.  Dr. Theodis Shove

## 2021-05-08 ENCOUNTER — Ambulatory Visit: Payer: Medicare HMO | Admitting: Physical Therapy

## 2021-05-08 ENCOUNTER — Other Ambulatory Visit: Payer: Self-pay

## 2021-05-08 ENCOUNTER — Encounter: Payer: Self-pay | Admitting: Physical Therapy

## 2021-05-08 DIAGNOSIS — Z114 Encounter for screening for human immunodeficiency virus [HIV]: Secondary | ICD-10-CM | POA: Diagnosis not present

## 2021-05-08 DIAGNOSIS — Z113 Encounter for screening for infections with a predominantly sexual mode of transmission: Secondary | ICD-10-CM | POA: Diagnosis not present

## 2021-05-08 DIAGNOSIS — R252 Cramp and spasm: Secondary | ICD-10-CM

## 2021-05-08 DIAGNOSIS — M6281 Muscle weakness (generalized): Secondary | ICD-10-CM | POA: Diagnosis not present

## 2021-05-08 NOTE — Patient Instructions (Signed)
Access Code: [email or web address] URL: [email or web address] Date: 05/08/2021 Prepared by: Jari Favre  Exercises Supine Figure 4 Piriformis Stretch - 1 x daily - 7 x weekly - 1 sets - 3 reps - 30 sec hold Supine Piriformis Stretch with Foot on Ground - 1 x daily - 7 x weekly - 1 sets - 3 reps - 30 sec hold Supine Single Knee to Chest Stretch - 2 x daily - 7 x weekly - 1 sets - 5 reps - 10 sec hold Supine Pelvic Floor Stretch - 1 x daily - 7 x weekly - 1 sets - 3 reps - 30 sec hold Supine Piriformis Stretch with Leg Straight - 1 x daily - 7 x weekly - 1 sets - 3 reps - 30 sec hold Seated Hamstring Stretch - 1 x daily - 7 x weekly - 1 sets - 3 reps - 30 sec hold Prone Quad Stretch with Towel Roll and Strap - 1 x daily - 7 x weekly - 3 sets - 10 reps Supine Butterfly Groin Stretch - 1 x daily - 7 x weekly - 3 reps - 1 sets - 30 sec hold

## 2021-05-08 NOTE — Therapy (Signed)
[facility] [address], [place], [place], [address] Phone: [phone]   Fax:  [phone]  Physical Therapy Treatment  Patient Details  Name: Nicole [name] MRN: [id number] Date of Birth: Jul 22, 1961 Referring Provider (PT): Hollace Hayward, NP   Encounter Date: 05/08/2021   PT End of Session - 05/08/21 1157     Visit Number 3    Date for PT Re-Evaluation 07/04/21    Authorization Type [employer] - 07/04/21 ; 8 visits    PT Start Time 1149    PT Stop Time 1229    PT Time Calculation (min) 40 min    Activity Tolerance Patient tolerated treatment well    Behavior During Therapy [facility] for tasks assessed/performed             Past Medical History:  Diagnosis Date   Acute pain of right knee 01/04/2021   Acute pain of right shoulder 01/04/2021   Anemia    Anxiety    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 Verl Blalock 2/09   Reflux esophagitis    Sleep apnea    No CPAP   Vaginal discharge 03/02/2020    Past Surgical History:  Procedure Laterality Date   COLONOSCOPY     ECTOPIC PREGNANCY SURGERY     EXPLORATORY LAPAROTOMY  2000   x2 (Dr. Kendall Flack)   [place] VIRUS DFA     [place]    There were no vitals filed for this visit.   Subjective Assessment - 05/08/21 1153     Subjective Pt states her rectum and vagina is hurting when sitting a certain way.    Patient Stated Goals empty bladder and have BMs without straining and strengthen core    Currently in Pain? No/denies                               [facility] Adult PT Treatment/Exercise - 05/08/21 0001       Self-Care   Other Self-Care  Comments  self massage with spiky ball roller      Lumbar Exercises: Stretches   Active Hamstring Stretch Right;Left;30 seconds    Hip Flexor Stretch Right;Left;3 reps;30 seconds    Quad Stretch Right;Left;2 reps;30 seconds    Other Lumbar Stretch Exercise butterfly stretch      Manual Therapy   Manual Therapy Soft tissue mobilization;Myofascial release    Soft tissue mobilization quads and adductors    Myofascial Release abdominal throughout abdomen for release around large intestine                     PT Education - 05/08/21 1239     Education Details Access Code: [email or web address]    Person(s) Educated Patient    Methods Explanation;Demonstration;Handout;Verbal cues;Tactile cues    Comprehension Verbalized understanding;Returned demonstration              PT Short Term Goals - 04/17/21 1512       PT SHORT TERM GOAL #1   Title ind with toileting techniques    Baseline not using squatty  potty      PT SHORT TERM GOAL #2   Title Pt will be able to tolerate internal pelvic assessment due to improved ability to relax pelvic floor muscles    Baseline still unable to tolerate today    Status On-going               PT Long Term Goals - 04/11/21 1648       PT LONG TERM GOAL #1   Title Pt will be able to void without straining to prevent increased risk of prolapse    Time 12    Period Weeks    Status New    Target Date 07/04/21      PT LONG TERM GOAL #2   Title Pt will be ind with advanced HEP    Time 12    Period Weeks    Status New    Target Date 07/04/21      PT LONG TERM GOAL #3   Title Pt will be able to have intercouse with 1/3 [place] at most    Baseline 3/3    Time 12    Period Weeks    Status New    Target Date 07/04/21      PT LONG TERM GOAL #4   Title Pt will be able to perform SLS for 10 sec with minimal perturbations and no UE support for reduced risk of falls    Time 12    Period Weeks    Status New    Target Date 07/04/21                    Plan - 05/08/21 1215     Clinical Impression Statement Pt was not at PT for several weeks due to sick with Covid.  Pt is still reporting not able to do her HEP at home due to depression and having been sick. Pt had some fascial restrictions throuhgout the abdomen that released well with MFR techniques.  Pt tight throughout quads and adductors and working on release through LE so she can tolerate pelvic soft tissue work. Demo and education provided for self massage with roller to thighs.  pt will benefit from skilled PT to continue to address pelvic floor impairments so she can return to full function.    PT Treatment/Interventions ADLs/Self Care Home Management;Biofeedback;Cryotherapy;Electrical Stimulation;Moist Heat;Therapeutic activities;Therapeutic exercise;Neuromuscular re-education;Manual techniques;Passive range of motion;Dry needling;Taping;Patient/family education    PT Next Visit Plan internal soft tissue patient would like to try next visit; f/u on water and stretches from HEP,    PT Home Exercise Plan Access Code: [email or web address]    Consulted and Agree with Plan of Care Patient             Patient will benefit from skilled therapeutic intervention in order to improve the following deficits and impairments:  Pain, Decreased coordination, Postural dysfunction, Increased muscle spasms, Decreased strength  Visit Diagnosis: Muscle weakness (generalized)  Cramp and spasm     Problem List Patient Active Problem List   Diagnosis Date Noted   Other personal history of psychological trauma, not elsewhere classified 03/16/2021   Mass of soft tissue of abdomen 01/05/2021   Lump of right breast 01/04/2021   History of hyperlipidemia 11/10/2019   Overweight 09/23/2019   History of recurrent UTI (urinary tract infection) 11/12/2017   Marijuana use 11/12/2017   OSA (obstructive sleep apnea) 12/27/2015   Bipolar disorder ([place]) 04/14/2007    Camillo Flaming Godwin Tedesco,  PT 05/08/2021, 12:52 PM  [facility] [address], [place], [place], [address] Phone: [phone]   Fax:  [phone]  Name: Nicole [name] MRN: [id number] Date of Birth: 10/07/1960

## 2021-05-09 ENCOUNTER — Encounter: Payer: Self-pay | Admitting: Internal Medicine

## 2021-05-09 ENCOUNTER — Ambulatory Visit (INDEPENDENT_AMBULATORY_CARE_PROVIDER_SITE_OTHER): Payer: Medicare HMO | Admitting: Internal Medicine

## 2021-05-09 VITALS — BP 107/62 | HR 71 | Temp 98.0°F | Ht 65.5 in | Wt 185.3 lb

## 2021-05-09 DIAGNOSIS — N6310 Unspecified lump in the right breast, unspecified quadrant: Secondary | ICD-10-CM

## 2021-05-09 DIAGNOSIS — L720 Epidermal cyst: Secondary | ICD-10-CM | POA: Diagnosis not present

## 2021-05-10 DIAGNOSIS — L720 Epidermal cyst: Secondary | ICD-10-CM | POA: Insufficient documentation

## 2021-05-10 NOTE — Assessment & Plan Note (Signed)
She has been unable to undergo the mammogram or ultrasound yet. She had them scheduled a couple of weeks ago however, unfortunately, developed COVID 19, and had to cancel. She will call to reschedule the imaging and follow up with Dr. [name] afterward.

## 2021-05-10 NOTE — Assessment & Plan Note (Addendum)
HPI: Located just lateral to midline on her anterior neck. First noted by her around 4w ago. She initially thought it was an ingrown hair however it has persisted and she feels that it has grown slightly. Denies any fevers, chills, drainage, erythema, edema, pain around the site or other sign of systemic infection.  Exam: small, approximately 1cm, semi-firm darkly pigmented raised lesion. No surrounding erythema, no drainage, no tenderness Assessment: seen by Dr. Evette Doffing with me in the clinic today. We discussed the diagnosis and plan. She was hoping to get it lanced today however we discussed the nature of inclusion cysts and that, should she want it removed, would be best done by dermatology. She already has a dermatologist but would like to transfer to a different office. Plan  Referral to dermatology placed. She wishes to see a provider whom's office is on [address].  Return precautions discussed

## 2021-05-10 NOTE — Progress Notes (Signed)
   Office Visit   Patient ID: EYDIE WORMLEY, female    DOB: Dec 03, 1960, 60 y.o.   MRN: [id number]   PCP: [name], MD   Subjective:  Mazie Fencl Rutkowski is a 60 y.o. year old female who presents for evaluation of a neck lesion. Please refer to problem based charting for assessment and plan.  Objective:   BP 107/62   Pulse 71   Temp 98 F (36.7 C) (Oral)   Ht 5' 5.5" (1.664 m)   Wt 185 lb 4.8 oz (84.1 kg)   LMP 03/03/2016 (Approximate)   SpO2 97%   BMI 30.37 kg/m  BP Readings from Last 3 Encounters:  05/09/21 107/62  04/07/21 124/72  03/16/21 107/64   Skin:  Approximately 1cm darkly pigmented raised lesion on the anterior neck just lateral to midline. Semi-firm. No surrounding erythema, edema. No drainage. No pain on manipulation. Mobile. No cervical adenopathy. Small flat darkly pigmented lesion on the right breast. Irregular boarders. Heterogenously colored. I am not able to appreciate any underlying breast mass.   Assessment & Plan:   Problem List Items Addressed This Visit       Other   Lump of right breast    She has been unable to undergo the mammogram or ultrasound yet. She had them scheduled a couple of weeks ago however, unfortunately, developed COVID 19, and had to cancel. She will call to reschedule the imaging and follow up with Dr. [name] afterward.      Inclusion cyst - Primary    HPI: Located just lateral to midline on her anterior neck. First noted by her around 4w ago. She initially thought it was an ingrown hair however it has persisted and she feels that it has grown slightly. Denies any fevers, chills, drainage, erythema, edema, pain around the site or other sign of systemic infection.  Exam: small, approximately 1cm, semi-firm darkly pigmented raised lesion. No surrounding erythema, no drainage, no tenderness Assessment: seen by Dr. Evette Doffing with me in the clinic today. We discussed the diagnosis and plan. She was hoping to get it lanced today however  we discussed the nature of inclusion cysts and that, should she want it removed, would be best done by dermatology. She already has a dermatologist but would like to transfer to a different office. Plan Referral to dermatology placed. She wishes to see a provider whom's office is on [address]. Return precautions discussed      Relevant Orders   Ambulatory referral to Dermatology     Return if symptoms worsen or fail to improve.   Pt discussed with Dr. Elwanda Brooklyn, MD Internal Medicine Resident PGY-3 [facility] 05/10/2021 7:03 AM

## 2021-05-10 NOTE — Progress Notes (Signed)
Internal Medicine Attending:   I saw and examined the patient. I reviewed the resident's note and I agree with the resident's findings and plan as documented in the resident's note.  

## 2021-05-16 ENCOUNTER — Ambulatory Visit: Payer: Medicare HMO | Admitting: Physical Therapy

## 2021-05-23 ENCOUNTER — Ambulatory Visit: Payer: Medicare HMO | Attending: Internal Medicine | Admitting: Physical Therapy

## 2021-05-23 DIAGNOSIS — R252 Cramp and spasm: Secondary | ICD-10-CM | POA: Insufficient documentation

## 2021-05-23 DIAGNOSIS — M6281 Muscle weakness (generalized): Secondary | ICD-10-CM | POA: Insufficient documentation

## 2021-05-29 DIAGNOSIS — L989 Disorder of the skin and subcutaneous tissue, unspecified: Secondary | ICD-10-CM | POA: Diagnosis not present

## 2021-05-29 DIAGNOSIS — D239 Other benign neoplasm of skin, unspecified: Secondary | ICD-10-CM | POA: Diagnosis not present

## 2021-05-29 DIAGNOSIS — L720 Epidermal cyst: Secondary | ICD-10-CM | POA: Diagnosis not present

## 2021-05-29 DIAGNOSIS — L853 Xerosis cutis: Secondary | ICD-10-CM | POA: Diagnosis not present

## 2021-06-03 ENCOUNTER — Other Ambulatory Visit: Payer: Medicare HMO

## 2021-06-07 ENCOUNTER — Ambulatory Visit: Payer: Medicare HMO | Admitting: Physical Therapy

## 2021-06-07 ENCOUNTER — Other Ambulatory Visit: Payer: Self-pay

## 2021-06-07 ENCOUNTER — Encounter: Payer: Self-pay | Admitting: Physical Therapy

## 2021-06-07 DIAGNOSIS — M6281 Muscle weakness (generalized): Secondary | ICD-10-CM | POA: Diagnosis not present

## 2021-06-07 DIAGNOSIS — R252 Cramp and spasm: Secondary | ICD-10-CM | POA: Diagnosis not present

## 2021-06-07 NOTE — Therapy (Signed)
[facility] @ [place], [place], [address] Phone: [phone]   Fax:  [phone]  Physical Therapy Treatment  Patient Details  Name: Nicole [name] MRN: [id number] Date of Birth: 02-09-1961 Referring Provider (PT): Hollace Hayward, NP   Encounter Date: 06/07/2021   PT End of Session - 06/07/21 1149     Visit Number 4    Date for PT Re-Evaluation 07/04/21    Authorization Type [employer] - 07/04/21 ; 8 visits    PT Start Time 2703    PT Stop Time 1225    PT Time Calculation (min) 40 min    Activity Tolerance Patient tolerated treatment well    Behavior During Therapy [facility] for tasks assessed/performed             Past Medical History:  Diagnosis Date   Acute pain of right knee 01/04/2021   Acute pain of right shoulder 01/04/2021   Anemia    Anxiety    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 Verl Blalock 2/09   Reflux esophagitis    Sleep apnea    No CPAP   Vaginal discharge 03/02/2020    Past Surgical History:  Procedure Laterality Date   COLONOSCOPY     ECTOPIC PREGNANCY SURGERY     EXPLORATORY LAPAROTOMY  2000   x2 (Dr. Kendall Flack)   [place] VIRUS DFA     TUBAL LIGATION  1987    There were no vitals filed for this visit.   Subjective Assessment - 06/07/21 1153     Subjective I am still having pain with sex.  I am using lubricant but it feels like it can dry out.    Patient Stated Goals empty bladder and have BMs without straining and strengthen core    Currently in Pain? No/denies                               [facility] Adult PT Treatment/Exercise - 06/07/21 0001       Self-Care    Other Self-Care Comments  moisturizing with coconut oil and to use as lubricant; foam rolling gluteal; tennis ball massage low back and glutes      Manual Therapy   Soft tissue mobilization lumbar and gluteals in prone    Myofascial Release thoracolumbar                       PT Short Term Goals - 04/17/21 1512       PT SHORT TERM GOAL #1   Title ind with toileting techniques    Baseline not using squatty potty      PT SHORT TERM GOAL #2   Title Pt will be able to tolerate internal pelvic assessment due to improved ability to relax pelvic floor muscles    Baseline still unable to tolerate today    Status On-going               PT Long Term Goals - 06/07/21 1225       PT LONG TERM GOAL #1  Title Pt will be able to void without straining to prevent increased risk of prolapse    Baseline not straining at all to empty    Status Achieved      PT LONG TERM GOAL #2   Title Pt will be ind with advanced HEP    Status On-going      PT LONG TERM GOAL #3   Title Pt will be able to have intercouse with 1/3 [place] at most    Baseline 3/3    Status On-going      PT LONG TERM GOAL #4   Title Pt will be able to perform SLS for 10 sec with minimal perturbations and no UE support for reduced risk of falls    Status On-going                   Plan - 06/07/21 1226     Clinical Impression Statement Pt was educated today in vaginal health and moisturizing techniques.  Pt is still having pain with intercourse and was not open to doing any internal treatment or assessment.  Pt continues to have good release with soft tissue work and was educated in ball and foam rolling to maintain improved soft tissue length.  Pt also given extensive information on using coconut and/or olive oil during intercourse and different was to apply that she may be more comfortable with.    PT Treatment/Interventions ADLs/Self Care Home Management;Biofeedback;Cryotherapy;Electrical  Stimulation;Moist Heat;Therapeutic activities;Therapeutic exercise;Neuromuscular re-education;Manual techniques;Passive range of motion;Dry needling;Taping;Patient/family education    PT Next Visit Plan internal soft tissue patient would like to try next visit; core strengthening    PT Home Exercise Plan Access Code: [email or web address]    Consulted and Agree with Plan of Care Patient             Patient will benefit from skilled therapeutic intervention in order to improve the following deficits and impairments:  Pain, Decreased coordination, Postural dysfunction, Increased muscle spasms, Decreased strength  Visit Diagnosis: Muscle weakness (generalized)  Cramp and spasm     Problem List Patient Active Problem List   Diagnosis Date Noted   Inclusion cyst 05/10/2021   Other personal history of psychological trauma, not elsewhere classified 03/16/2021   Mass of soft tissue of abdomen 01/05/2021   Lump of right breast 01/04/2021   History of hyperlipidemia 11/10/2019   Overweight 09/23/2019   History of recurrent UTI (urinary tract infection) 11/12/2017   Marijuana use 11/12/2017   OSA (obstructive sleep apnea) 12/27/2015   Bipolar disorder ([place]) 04/14/2007    Jule Ser, PT 06/07/2021, 2:00 PM  [facility] @ [place], [place], [address] Phone: [phone]   Fax:  [phone]  Name: Nicole [name] MRN: [id number] Date of Birth: [birth year]

## 2021-06-12 ENCOUNTER — Other Ambulatory Visit: Payer: Self-pay

## 2021-06-12 ENCOUNTER — Ambulatory Visit: Payer: Medicare HMO | Admitting: Physical Therapy

## 2021-06-12 ENCOUNTER — Encounter: Payer: Self-pay | Admitting: Physical Therapy

## 2021-06-12 DIAGNOSIS — M6281 Muscle weakness (generalized): Secondary | ICD-10-CM

## 2021-06-12 DIAGNOSIS — R252 Cramp and spasm: Secondary | ICD-10-CM

## 2021-06-12 NOTE — Therapy (Signed)
[facility] @ [place], [place], [address] Phone: [phone]   Fax:  [phone]  Physical Therapy Treatment  Patient Details  Name: Nicole [name] MRN: [id number] Date of Birth: 08/28/1960 Referring Provider (PT): Hollace Hayward, NP   Encounter Date: 06/12/2021   PT End of Session - 06/12/21 1208     Visit Number 5    Date for PT Re-Evaluation 07/04/21    Authorization Type [employer] - 07/04/21 ; 8 visits    PT Start Time 1148    PT Stop Time 1226    PT Time Calculation (min) 38 min    Activity Tolerance Patient tolerated treatment well    Behavior During Therapy [facility] for tasks assessed/performed             Past Medical History:  Diagnosis Date   Acute pain of right knee 01/04/2021   Acute pain of right shoulder 01/04/2021   Anemia    Anxiety    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 Verl Blalock 2/09   Reflux esophagitis    Sleep apnea    No CPAP   Vaginal discharge 03/02/2020    Past Surgical History:  Procedure Laterality Date   COLONOSCOPY     ECTOPIC PREGNANCY SURGERY     EXPLORATORY LAPAROTOMY  2000   x2 (Dr. Kendall Flack)   [place] VIRUS DFA     [place]    There were no vitals filed for this visit.   Subjective Assessment - 06/12/21 1158     Subjective I had no issues since last time I was here.    Patient Stated Goals empty bladder and have BMs without straining and strengthen core    Currently in Pain? No/denies                               [facility] Adult PT Treatment/Exercise - 06/12/21 0001       Neuro Re-ed    Neuro Re-ed Details  TrA activation with  breathing      Lumbar Exercises: Supine   Dead Bug 20 reps    Dead Bug Limitations arms, then legs, then both      Manual Therapy   Manual Therapy Internal Pelvic Floor    Manual therapy comments pt identity confirmed and internal soft tissue performed    Internal Pelvic Floor levators anterior to mid                       PT Short Term Goals - 06/12/21 1402       PT SHORT TERM GOAL #1   Title ind with toileting techniques    Baseline has been doing what she can    Status Achieved      PT SHORT TERM GOAL #2   Title Pt will be able to tolerate internal pelvic assessment due to improved ability to relax pelvic floor muscles    Baseline tolerated today    Status Achieved  PT Long Term Goals - 06/12/21 1209       PT LONG TERM GOAL #1   Title Pt will be able to void without straining to prevent increased risk of prolapse    Baseline not straining at all to empty    Status Achieved      PT LONG TERM GOAL #2   Title Pt will be ind with advanced HEP    Status On-going      PT LONG TERM GOAL #3   Title Pt will be able to have intercouse with 1/3 [place] at most    Baseline still hurt a good amount 2/3 Mar[place]    Status On-going      PT LONG TERM GOAL #4   Title Pt will be able to perform SLS for 10 sec with minimal perturbations and no UE support for reduced risk of falls                   Plan - 06/12/21 1237     Clinical Impression Statement Pt was able to tolerate internal assessment of soft tissue and some STM to levators anterior to mid muscle belly. Pt did become sore after that so STM was discontinued due to patient tolerance.  Pt was given core exercises to add and educted on continued self stretching at home with coconut oil.  Pt has not started this yet and was very dry today.    PT Treatment/Interventions ADLs/Self Care Home Management;Biofeedback;Cryotherapy;Electrical Stimulation;Moist Heat;Therapeutic  activities;Therapeutic exercise;Neuromuscular re-education;Manual techniques;Passive range of motion;Dry needling;Taping;Patient/family education    PT Next Visit Plan f/u on internal soft tissue and do this again. assess strength; f/u on core strength    PT Home Exercise Plan Access Code: [email or web address]    Consulted and Agree with Plan of Care Patient             Patient will benefit from skilled therapeutic intervention in order to improve the following deficits and impairments:  Pain, Decreased coordination, Postural dysfunction, Increased muscle spasms, Decreased strength  Visit Diagnosis: Cramp and spasm  Muscle weakness (generalized)     Problem List Patient Active Problem List   Diagnosis Date Noted   Inclusion cyst 05/10/2021   Other personal history of psychological trauma, not elsewhere classified 03/16/2021   Mass of soft tissue of abdomen 01/05/2021   Lump of right breast 01/04/2021   History of hyperlipidemia 11/10/2019   Overweight 09/23/2019   History of recurrent UTI (urinary tract infection) 11/12/2017   Marijuana use 11/12/2017   OSA (obstructive sleep apnea) 12/27/2015   Bipolar disorder ([place]) 04/14/2007    Jule Ser, PT 06/12/2021, 2:03 PM  [facility] @ [name], [place], [address] Phone: [phone]   Fax:  [phone]  Name: Nicole [name] MRN: [id number] Date of Birth: Jun 08, 1961

## 2021-06-13 DIAGNOSIS — L989 Disorder of the skin and subcutaneous tissue, unspecified: Secondary | ICD-10-CM | POA: Diagnosis not present

## 2021-06-22 DIAGNOSIS — L940 Localized scleroderma [morphea]: Secondary | ICD-10-CM | POA: Diagnosis not present

## 2021-06-23 DIAGNOSIS — L940 Localized scleroderma [morphea]: Secondary | ICD-10-CM | POA: Diagnosis not present

## 2021-06-27 ENCOUNTER — Ambulatory Visit: Payer: Medicare HMO | Attending: Internal Medicine | Admitting: Physical Therapy

## 2021-06-27 ENCOUNTER — Other Ambulatory Visit: Payer: Self-pay

## 2021-06-27 ENCOUNTER — Encounter: Payer: Self-pay | Admitting: Physical Therapy

## 2021-06-27 DIAGNOSIS — R252 Cramp and spasm: Secondary | ICD-10-CM | POA: Insufficient documentation

## 2021-06-27 DIAGNOSIS — M6281 Muscle weakness (generalized): Secondary | ICD-10-CM | POA: Insufficient documentation

## 2021-06-27 NOTE — Therapy (Signed)
[facility] [place], [place], [address] Phone: [phone]   Fax:  [phone]  Physical Therapy Treatment  Patient Details  Name: Nicole [name] MRN: [id number] Date of Birth: 06/05/1961 Referring Provider (PT): Hollace Hayward, NP   Encounter Date: 06/27/2021   PT End of Session - 06/27/21 1511     Visit Number 6    Date for PT Re-Evaluation 07/04/21    Authorization Type [employer] - 07/04/21 ; 8 visits    PT Start Time 8786    PT Stop Time 1312    PT Time Calculation (min) 37 min    Activity Tolerance Patient tolerated treatment well    Behavior During Therapy [facility] for tasks assessed/performed             Past Medical History:  Diagnosis Date   Acute pain of right knee 01/04/2021   Acute pain of right shoulder 01/04/2021   Anemia    Anxiety    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 Verl Blalock 2/09   Reflux esophagitis    Sleep apnea    No CPAP   Vaginal discharge 03/02/2020    Past Surgical History:  Procedure Laterality Date   COLONOSCOPY     ECTOPIC PREGNANCY SURGERY     EXPLORATORY LAPAROTOMY  2000   x2 (Dr. Kendall Flack)   [place] VIRUS DFA     TUBAL LIGATION  1987    There were no vitals filed for this visit.   Subjective Assessment - 06/27/21 1242     Subjective I had sex yesterday and it didn't feel bad.  I am still straining to have a bowel movement and it goes from tight balls to soft stool.  I have been drinking more water in the last month.    Patient Stated Goals empty bladder and have BMs without straining and strengthen core                               [facility] Adult PT  Treatment/Exercise - 06/27/21 0001       Lumbar Exercises: Stretches   Other Lumbar Stretch Exercise thoracic sidebending and flexion/rotation      Lumbar Exercises: Supine   Dead Bug 20 reps      Manual Therapy   Myofascial Release abdominal around rectum and bladder and externally to pelvic floor; ascending/descending colon                       PT Short Term Goals - 06/12/21 1402       PT SHORT TERM GOAL #1   Title ind with toileting techniques    Baseline has been doing what she can    Status Achieved      PT SHORT TERM GOAL #2   Title Pt will be able to tolerate internal pelvic assessment due to improved ability to relax pelvic floor muscles    Baseline tolerated today    Status Achieved               PT Long  Term Goals - 06/12/21 1209       PT LONG TERM GOAL #1   Title Pt will be able to void without straining to prevent increased risk of prolapse    Baseline not straining at all to empty    Status Achieved      PT LONG TERM GOAL #2   Title Pt will be ind with advanced HEP    Status On-going      PT LONG TERM GOAL #3   Title Pt will be able to have intercouse with 1/3 Marinoff at most    Baseline still hurt a good amount 2/3 Marinoff    Status On-going      PT LONG TERM GOAL #4   Title Pt will be able to perform SLS for 10 sec with minimal perturbations and no UE support for reduced risk of falls                   Plan - 06/27/21 1516     Clinical Impression Statement Pt was able to continue with strengthening core today.  Pt was doing much better with pelvic pain during intercourse . She still reports straining during a bowel movement so abdominal and pelvic release done externally.  She has ribcage in sidebend to the Rt so ribcage mobs and stretches were done to improve this.  Pt responded well to treatment and given updates for HEP.  Pt will benefit from skilled PT to continue to address core posture and strength for improved  BMs.    PT Treatment/Interventions ADLs/Self Care Home Management;Biofeedback;Cryotherapy;Electrical Stimulation;Moist Heat;Therapeutic activities;Therapeutic exercise;Neuromuscular re-education;Manual techniques;Passive range of motion;Dry needling;Taping;Patient/family education    PT Next Visit Plan RE-EVAL, f/u on internal soft tissue and do this again. assess strength; f/u on core strength    PT Home Exercise Plan Access Code: [email or web address]    Consulted and Agree with Plan of Care Patient             Patient will benefit from skilled therapeutic intervention in order to improve the following deficits and impairments:  Pain, Decreased coordination, Postural dysfunction, Increased muscle spasms, Decreased strength  Visit Diagnosis: Cramp and spasm  Muscle weakness (generalized)     Problem List Patient Active Problem List   Diagnosis Date Noted   Inclusion cyst 05/10/2021   Other personal history of psychological trauma, not elsewhere classified 03/16/2021   Mass of soft tissue of abdomen 01/05/2021   Lump of right breast 01/04/2021   History of hyperlipidemia 11/10/2019   Overweight 09/23/2019   History of recurrent UTI (urinary tract infection) 11/12/2017   Marijuana use 11/12/2017   OSA (obstructive sleep apnea) 12/27/2015   Bipolar disorder ([place]) 04/14/2007    Jule Ser, PT 06/27/2021, 3:28 PM  [facility] [place], [place], [address] Phone: [phone]   Fax:  [phone]  Name: FINLEY DINKEL MRN: [id number] Date of Birth: 25-Feb-1961

## 2021-06-28 DIAGNOSIS — H3561 Retinal hemorrhage, right eye: Secondary | ICD-10-CM | POA: Diagnosis not present

## 2021-06-28 DIAGNOSIS — H2513 Age-related nuclear cataract, bilateral: Secondary | ICD-10-CM | POA: Diagnosis not present

## 2021-06-28 DIAGNOSIS — H40013 Open angle with borderline findings, low risk, bilateral: Secondary | ICD-10-CM | POA: Diagnosis not present

## 2021-06-29 ENCOUNTER — Encounter: Payer: Medicare HMO | Admitting: Internal Medicine

## 2021-07-03 ENCOUNTER — Ambulatory Visit (INDEPENDENT_AMBULATORY_CARE_PROVIDER_SITE_OTHER): Payer: Medicare HMO | Admitting: Internal Medicine

## 2021-07-03 ENCOUNTER — Encounter: Payer: Self-pay | Admitting: Internal Medicine

## 2021-07-03 ENCOUNTER — Other Ambulatory Visit: Payer: Self-pay

## 2021-07-03 DIAGNOSIS — L940 Localized scleroderma [morphea]: Secondary | ICD-10-CM | POA: Diagnosis not present

## 2021-07-03 DIAGNOSIS — H3560 Retinal hemorrhage, unspecified eye: Secondary | ICD-10-CM | POA: Diagnosis not present

## 2021-07-03 NOTE — Progress Notes (Signed)
   CC: Bleeding in eyes; Morphea   HPI:  [name] is a 60 y.o. with a PMHx as listed below who presents to the clinic for  Bleeding in eyes; Morphea .   Please see the Encounters tab for problem-based Assessment & Plan regarding status of patient's acute and chronic conditions.  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 Verl Blalock 2/09   Reflux esophagitis    Sleep apnea    No CPAP   Vaginal discharge 03/02/2020   Review of Systems: Review of Systems  Eyes:  Negative for blurred vision, double vision, photophobia, pain, discharge and redness.  Cardiovascular:  Negative for chest pain, palpitations and leg swelling.  Skin:  Positive for rash (chronic unchanged).  Neurological:  Negative for dizziness and headaches.   Physical Exam:  Vitals:   07/03/21 1427  BP: 125/65  Pulse: 82  Temp: 98.2 F (36.8 C)  TempSrc: Oral  SpO2: 99%  Weight: 188 lb 12.8 oz (85.6 kg)  Height: 5' 5.5" (1.664 m)   Physical Exam Vitals and nursing note reviewed.  Constitutional:      General: She is not in acute distress.    Appearance: She is normal weight.  HENT:     Head: Normocephalic and atraumatic.  Eyes:     Extraocular Movements: Extraocular movements intact.     Conjunctiva/sclera: Conjunctivae normal.     Pupils: Pupils are equal, round, and reactive to light.  Pulmonary:     Effort: Pulmonary effort is normal. No respiratory distress.  Skin:    General: Skin is warm and dry.  Neurological:     Mental Status: She is alert and oriented to person, place, and  time. Mental status is at baseline.  Psychiatric:        Mood and Affect: Mood normal.        Behavior: Behavior normal.   Assessment & Plan:   See Encounters Tab for problem based charting.  Patient discussed with Dr. Heber Lilburn

## 2021-07-03 NOTE — Assessment & Plan Note (Signed)
Ms. Manka states she recently followed up with her eye doctor.  Examination remarkable for that she had "internal bleeding" in her eye.  She was told this was most consistent with someone with a history of high blood pressure.  She denies any vision changes, photophobia, dizziness.   She notes that she has a past medical history of hypotension historically and has never been diagnosed with high blood pressure.  Assessment/plan: I suspect that findings are retinal hemorrhages given that eye doctor suspected etiology is due to hypertension.  Unknown at this time if she saw optometrist or an ophthalmologist.  We are still in the process of obtaining the records.  -Obtain optometry or ophthalmology records

## 2021-07-03 NOTE — Assessment & Plan Note (Signed)
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  She is also noticed temporal wasting that is worse on the left than the right.  At one of our visit several months ago, she requested a referral to dermatology.  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  She has a visit tomorrow with dermatology to discuss her diagnosis further and neck steps.  She is very concerned that this diagnosis may be life limiting as she read online there is no cure.  Assessment/plan: - Recommended Nicole [name] have the records with dermatology sent to our clinic so we may advise her further.  We discussed that there are multiple types of scleroderma and that they are not necessarily life limiting.

## 2021-07-04 ENCOUNTER — Telehealth: Payer: Self-pay | Admitting: Physical Therapy

## 2021-07-04 ENCOUNTER — Ambulatory Visit: Payer: Medicare HMO | Admitting: Physical Therapy

## 2021-07-04 DIAGNOSIS — L940 Localized scleroderma [morphea]: Secondary | ICD-10-CM | POA: Diagnosis not present

## 2021-07-04 NOTE — Telephone Encounter (Signed)
Pt was called and states she had the time mixed up since she had another appointment.  She was reminded of no show appointment and understands that she has no more no shows available  [employer], PT 07/04/21 2:41 PM

## 2021-07-07 NOTE — Progress Notes (Signed)
Internal Medicine Clinic Attending  Case discussed with Dr. Basaraba  At the time of the visit.  We reviewed the resident's history and exam and pertinent patient test results.  I agree with the assessment, diagnosis, and plan of care documented in the resident's note.  

## 2021-07-10 DIAGNOSIS — H524 Presbyopia: Secondary | ICD-10-CM | POA: Diagnosis not present

## 2021-07-10 DIAGNOSIS — Z0100 Encounter for examination of eyes and vision without abnormal findings: Secondary | ICD-10-CM | POA: Diagnosis not present

## 2021-07-11 ENCOUNTER — Ambulatory Visit: Payer: Medicare HMO | Admitting: Physical Therapy

## 2021-07-11 ENCOUNTER — Encounter: Payer: Self-pay | Admitting: Internal Medicine

## 2021-07-11 ENCOUNTER — Other Ambulatory Visit: Payer: Self-pay

## 2021-07-11 ENCOUNTER — Ambulatory Visit (INDEPENDENT_AMBULATORY_CARE_PROVIDER_SITE_OTHER): Payer: Medicare HMO | Admitting: Internal Medicine

## 2021-07-11 VITALS — BP 125/67 | HR 74 | Temp 97.8°F | Ht 65.0 in | Wt 165.0 lb

## 2021-07-11 DIAGNOSIS — R252 Cramp and spasm: Secondary | ICD-10-CM

## 2021-07-11 DIAGNOSIS — L940 Localized scleroderma [morphea]: Secondary | ICD-10-CM

## 2021-07-11 DIAGNOSIS — M6281 Muscle weakness (generalized): Secondary | ICD-10-CM

## 2021-07-11 NOTE — Patient Instructions (Signed)
STRETCHING THE PELVIC FLOOR MUSCLES NO DILATOR  Supplies Vaginal lubricant - coconut oil Mirror (optional) Gloves (optional) or clean hands Positioning Start in a semi-reclined position with your head propped up. Bend your knees and place your thumb or finger at the vaginal opening. Procedure Apply a moderate amount of lubricant on the outer skin of your vagina, the labia minora.  Apply additional lubricant to your finger. Spread the skin away from the vaginal opening. Place the end of your finger at the opening. Do a maximum contraction of the pelvic floor muscles. Tighten the vagina and the anus maximally and relax. When you know they are relaxed, gently and slowly insert your finger into your vagina, directing your finger slightly downward, for 2-3 inches of insertion. Relax and stretch the 6 o'clock position Hold each stretch for _30-60 seconds, no pain more than 3/10 Repeat the stretching in the 4 o'clock and 8 o'clock positions. Next gently move your finger in a "U" shape  several times.  You can also enter a second finger to work to spread the vaginal opening wider from 3:00-6:00 and 6:00-9:00 or 3:00-9:00 Perform daily or every other day Once you have accomplished the techniques you may try them in standing with one foot resting on the tub, or in other positions.  This is a good stretch to do in the shower if you don't need to use lubricant.  This can be done at 35 weeks or later in your pregnancy.

## 2021-07-11 NOTE — Progress Notes (Signed)
Subjective:  CC: Follow-up after biopsy result consistent with morphea  HPI:  [name] is a 60 y.o. female with a past medical history stated below and presents today following diagnosis of morphea after biopsy completed. Please see problem based assessment and plan for additional details.  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 Verl Blalock 2/09   Reflux esophagitis    Sleep apnea    No CPAP   Vaginal discharge 03/02/2020    Current Outpatient Medications on File Prior to Visit  Medication Sig Dispense Refill   Ascorbic Acid (VITAMIN C) 100 MG tablet Take 100 mg by mouth daily.     Cholecalciferol (VITAMIN D3 PO) Take by mouth.     Cyanocobalamin (VITAMIN B-12 PO) Take by mouth.     No current facility-administered medications on file prior to visit.    Family History  Problem Relation Age of Onset   Hypertension Mother    Diabetes Mother    Heart disease Mother    Thyroid disease Mother    Heart attack Mother    Glaucoma Mother    Diabetes Father    Hypertension Sister    Diabetes Sister    Diabetes Sister    Hypertension Sister    Hypertension Sister    Diabetes Sister    Hypertension Brother    Diabetes Brother    Mental illness Son    Mental illness Son    Pancreatic cancer Maternal Uncle    Heart attack Maternal Grandmother    Heart attack Maternal Grandfather    Pancreatic cancer Other        family history   Colon cancer Neg Hx     Social History   Socioeconomic History   Marital status: Single    Spouse name: Not on file    Number of children: 3   Years of education: Not on file   Highest education level: Not on file  Occupational History   Not on file  Tobacco Use   Smoking status: Some Days    Packs/day: 0.25    Years: 36.00    Pack years: 9.00    Types: Cigarettes    Last attempt to quit: 12/30/2019    Years since quitting: 1.5   Smokeless tobacco: Never   Tobacco comments:    2 cigs per week  Vaping Use   Vaping Use: Never used  Substance and Sexual Activity   Alcohol use: Yes    Alcohol/week: 0.0 standard drinks    Comment: Rare glass of wine   Drug use: Yes    Types: Marijuana    Comment: daily, smokes it   Sexual activity: Never  Other Topics Concern   Not on file  Social History Narrative   Current Social History 02/16/2021        Patient lives alone in a [place] which is 2 stories. There are 13 steps with handrails up to the entrance the patient uses.  Patient's method of transportation is city bus.      The highest level of education was high school diploma.      The patient currently disabled.      Identified important Relationships are "My mother, my sisters, children, grandchildren (19)"       Pets : None       Interests / Fun: "Read, watch TV, movies, social media, talk on phone, shopping, travel"       Current Stressors: "Simple people get on my nerves,;drama."       Religious / Personal Beliefs: "Christian, [place], Pentecostal."       Other: "I stay away from people."      Nicole [name], BSN, RN-BC              Social Determinants of Health   Financial Resource Strain: Not on file  Food Insecurity: Food Insecurity Present   Worried About Charity fundraiser in the Last Year: Sometimes true   Arboriculturist in the Last Year: Sometimes true  Transportation Needs: Public librarian (Medical): Yes   Lack of Transportation (Non-Medical): Yes  Physical Activity: Not on file  Stress: Not on file  Social Connections: Not on  file  Intimate Partner Violence: Not on file    Review of Systems: ROS negative except for what is noted on the assessment and plan.  Objective:   Vitals:   07/11/21 1524  BP: 125/67  Pulse: 74  Temp: 97.8 F (36.6 C)  TempSrc: Oral  SpO2: 98%  Weight: 165 lb (74.8 kg)  Height: 5\' 5"  (1.651 m)    Physical Exam: Gen: A&O x3 and in no apparent distress, well appearing and nourished. HEENT:    Head -plaque on left side temple with an hairline   Eye - visual acuity grossly intact, conjunctiva clear, sclera non-icteric, EOM intact.    Mouth - No obvious caries or periodontal disease. Neck: Small, symmetric darkly pigmented raised nodule on anterior portion of the left side of the neck, AROM intact. CV: RRR, no murmurs, S1/S2 presents  Resp: Clear to ascultation bilaterally  Abd: BS (+) x4, soft, non-tender abdomen, without hepatosplenomegaly or masses MSK: Grossly normal AROM and strength x4 extremities, 3 cm x 2 cm indention on left shin with overlying skin changes Skin: good skin turgor, no rashes, unusual bruising, or prominent lesions.  Neuro: No focal deficits, grossly normal sensation and coordination.  Psych: Oriented x3 and upset after biopsy results. Intact memory, normal mood, judgement, affect, and insight.    Assessment & Plan:  See Encounters Tab for problem based charting.  Patient seen with Dr. Juluis Rainier Rayya Yagi, D.O. [facility]  PGY-1 Pager: [phone]  Phone: [phone] Date 07/12/2021  Time 6:16 AM

## 2021-07-11 NOTE — Patient Instructions (Signed)
[name], it was a pleasure seeing you today!  Today we discussed: Morphea I'm glad you came in today to discuss this new diagnosis.  This skin condition is best managed by a dermatologist, or skin specialist.  I will refer to a new dermatologist at your request.  I have also referred you to rheumatology, who specialize in muscle and joints.  Your condition does not effect the actual muscle or joint so a rheumatologist does not typically direct therapy for morphea of the skin.  I have ordered the following labs today:  Lab Orders  No laboratory test(s) ordered today      Referrals ordered today:    Referral Orders         Ambulatory referral to Dermatology         Ambulatory referral to Rheumatology      I have ordered the following medication/changed the following medications:   Stop the following medications: There are no discontinued medications.   Start the following medications: No orders of the defined types were placed in this encounter.    Follow-up:  4 weeks    Please make sure to arrive 15 minutes prior to your next appointment. If you arrive late, you may be asked to reschedule.   We look forward to seeing you next time. Please call our clinic at [phone] if you have any questions or concerns. The best time to call is Monday-Friday from 9am-4pm, but there is someone available 24/7. If after hours or the weekend, call the main hospital number and ask for the Internal Medicine Resident On-Call. If you need medication refills, please notify your pharmacy one week in advance and they will send [place] a request.  Thank you for letting us take part in your care. Wishing you the best!  Thank you, Dr. [facility]

## 2021-07-11 NOTE — Therapy (Signed)
[facility] @ [place], [place], [address] Phone: [phone]   Fax:  [phone]  Physical Therapy Treatment  Patient Details  Name: Nicole [name] MRN: [id number] Date of Birth: [birth year] Referring Provider (PT): Hollace Hayward, NP   Encounter Date: 07/11/2021   PT End of Session - 07/11/21 0954     Visit Number 7    Date for PT Re-Evaluation 08/22/21    Authorization Type [employer] - submitted today    PT Start Time 0930    PT Stop Time 1011    PT Time Calculation (min) 41 min    Activity Tolerance Patient tolerated treatment well    Behavior During Therapy [facility] for tasks assessed/performed             Past Medical History:  Diagnosis Date   Acute pain of right knee 01/04/2021   Acute pain of right shoulder 01/04/2021   Anemia    Anxiety    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 Verl Blalock 2/09   Reflux esophagitis    Sleep apnea    No CPAP   Vaginal discharge 03/02/2020    Past Surgical History:  Procedure Laterality Date   COLONOSCOPY     ECTOPIC PREGNANCY SURGERY     EXPLORATORY LAPAROTOMY  2000   x2 (Dr. Kendall Flack)   [place] VIRUS DFA     [place]    There were no vitals filed for this visit.   Subjective Assessment - 07/11/21 0946     Subjective I feel like things are loosening up . I have not been doing well because I found out some bad news about a skin condition I have been dealing with.    Patient Stated Goals empty bladder and have BMs without straining and strengthen core    Currently in Pain? No/denies                [facility] PT Assessment - 07/11/21 0001        Assessment   Medical Diagnosis R30.0 (ICD-10-CM) - Dysuria    Referring Provider (PT) Hollace Hayward, NP      Functional Tests   Functional tests Single leg stance      Single Leg Stance   Comments mild trendelenburg bil   able to stand 15+ sec bil     Strength   Overall Strength Comments hip abduction and adduction 4/5 +pain withadduction on Rt LE      Flexibility   Hamstrings 80% bil                        Pelvic Floor Special Questions - 07/11/21 0001     Exam Type Deferred               [facility] Adult PT Treatment/Exercise - 07/11/21 0001       Lumbar Exercises: Stretches   Active Hamstring Stretch Right;Left;30 seconds    Other Lumbar Stretch Exercise happy baby      Lumbar Exercises: Supine   Other Supine Lumbar Exercises supine on foam roller - red band  horizontal abduction, bent knee fall out, alt UE lift      Lumbar Exercises: Sidelying   Hip Abduction Right;Left;20 reps                       PT Short Term Goals - 06/12/21 1402       PT SHORT TERM GOAL #1   Title ind with toileting techniques    Baseline has been doing what she can    Status Achieved      PT SHORT TERM GOAL #2   Title Pt will be able to tolerate internal pelvic assessment due to improved ability to relax pelvic floor muscles    Baseline tolerated today    Status Achieved               PT Long Term Goals - 07/11/21 0944       PT LONG TERM GOAL #1   Title Pt will be able to void without straining to prevent increased risk of prolapse    Status Achieved      PT LONG TERM GOAL #2   Title Pt will be ind with advanced HEP    Status On-going      PT LONG TERM GOAL #3   Title Pt will be able to have intercouse with 1/3 [place] at most    Baseline 1/3 sometimes but still 2/3 most times    Status Achieved      PT LONG TERM GOAL #4   Title Pt will be able to perform SLS for 10 sec with minimal perturbations and no UE support for reduced risk of  falls    Baseline more than 10 sec bil today    Status Achieved                   Plan - 07/11/21 1030     Clinical Impression Statement Pt was re-assess today but was not agreeable to internal assessment of the pelvic floor.  Pt has weakness 4/5 and some pain with hip adduction, weakness 4/5 of hip abduction bil. Pt is still working on how to relax pelvic floor but has taken a while to become comfortable with the idea of internal soft tissue work. She is still straining and having difficulty voiding and pain with intercourse.  Pt was agreeable to continue skilled PT to work release of soft tissue internally but was not okay with this today due to personal reasons.  It would be beneficial for patinet to continue skilled PT for several more visits in order to address soft tissue impairments due to patient progressing slowly because of being highly sensative and tender in the pelvic floor muscles    PT Frequency 1x / week    PT Duration 6 weeks    PT Treatment/Interventions ADLs/Self Care Home Management;Biofeedback;Cryotherapy;Electrical Stimulation;Moist Heat;Therapeutic activities;Therapeutic exercise;Neuromuscular re-education;Manual techniques;Passive range of motion;Dry needling;Taping;Patient/family education    PT Next Visit Plan internal STM (do this first); core strength warm up on nustep    PT Home Exercise Plan Access Code: [email or web address], boxes or stool with toileting, self massage with coconut oil    Consulted and Agree with Plan of Care Patient             Patient will benefit from skilled therapeutic intervention in order to improve the following deficits and impairments:  Pain, Decreased coordination, Postural dysfunction, Increased muscle spasms, Decreased strength  Visit Diagnosis: Cramp and spasm  Muscle weakness (generalized)     Problem  List 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   Morphea 07/03/2021   Inclusion  cyst 05/10/2021   Other personal history of psychological trauma, not elsewhere classified 03/16/2021   Mass of soft tissue of abdomen 01/05/2021   Lump of right breast 01/04/2021   History of hyperlipidemia 11/10/2019   Overweight 09/23/2019   History of recurrent UTI (urinary tract infection) 11/12/2017   Marijuana use 11/12/2017   OSA (obstructive sleep apnea) 12/27/2015   Bipolar disorder ([place]) 04/14/2007    Jule Ser, PT 07/11/2021, 10:41 AM  [facility] @ [place], [place], [address] Phone: [phone]   Fax:  [phone]  Name: Nicole [name] MRN: [id number] Date of Birth: 1961-05-12

## 2021-07-11 NOTE — Assessment & Plan Note (Signed)
Copy of skin biopsy pathology report provided by patient in an envelope addressed to me.  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  Pathologist signed report on 06/23/21.  Will be scanned into media.

## 2021-07-11 NOTE — Progress Notes (Signed)
Copy of skin biopsy pathology report provided by patient in an envelope addressed to me.  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  Pathologist signed report on 06/23/21.  Will be scanned into media.

## 2021-07-12 NOTE — Assessment & Plan Note (Addendum)
Patient states that she was recently diagnosed via biopsy with morphea.  She states that she is frustrated because she has had symptoms and lesions for several years and her dermatologist that she was initially going to did not biopsy lesions.  She was referred to a different dermatologist in September and lesions were biopsied with results of morphea.  She followed up with the dermatologist and would like a another opinion on how to treat morphea because what she read online did not match up with what the dermatologist was telling her.  She reports several lesions including on her back, anterior portion of the left side of her neck, right breast, and a left shin indention with overlying skin changes.  She is also concerned about temporal wasting on the left side of her head.  She states that from her research that this could progress into her muscles and bones.  Dr. 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  We discussed that since morphea is localized to the skin a rheumatologist would not be guiding treatment, but rather a dermatologist would be helping treat Morphea.  She states that she would prefer to go to a rheumatologist after her new diagnosis.  She is concerned that this could be life limiting and we reassured her that it was unlikely to be.  She also reports that she thinks that this is infectious as she has given it to several close contacts who are not related to her.  Physical exam reveals plaque on posterior part of neck along hair line and left side of temple, small, semifirm darkly pigmented raised lesion on anterior portion of left side neck, and 3cm by 2 cm indention on left shin with overlying skin changes.  Plan: -patient request referral to a different dermatologist -Follow-up with records from dermatologist -referral to rheumatology

## 2021-07-17 ENCOUNTER — Encounter: Payer: Self-pay | Admitting: *Deleted

## 2021-07-18 ENCOUNTER — Ambulatory Visit: Payer: Medicare HMO | Admitting: Physical Therapy

## 2021-07-18 ENCOUNTER — Other Ambulatory Visit: Payer: Self-pay

## 2021-07-18 ENCOUNTER — Encounter: Payer: Self-pay | Admitting: Physical Therapy

## 2021-07-18 DIAGNOSIS — M6281 Muscle weakness (generalized): Secondary | ICD-10-CM

## 2021-07-18 DIAGNOSIS — R252 Cramp and spasm: Secondary | ICD-10-CM
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
## 2021-07-18 NOTE — Progress Notes (Signed)
Internal Medicine Clinic Attending  I saw and evaluated the patient.  I personally confirmed the key portions of the history and exam documented by Dr. Howie Ill   and I reviewed pertinent patient test results.  The assessment, diagnosis, and plan were formulated together and I agree with the documentation in the resident's note. I had a long conversation with the patient discussing the diagnosis of Morphea or Localized scleroderma.  We reviewed some of the Internet information that she had been reading on her phone.  I tried to reassure her that the information she received from her dermatologist was correct that that this was a treatable and manageable disease.  I do not believe that it is related to some chronic systemic concerns that she has (extreme example provided by patient "eating into her brain").  We discussed the possibility of having an evaluation by a rheumatologist to evaluate for systemic process but I discussed that the main treatment would likely be topical and provided by dermatology.

## 2021-07-31 ENCOUNTER — Telehealth: Payer: Self-pay | Admitting: Internal Medicine

## 2021-07-31 NOTE — Telephone Encounter (Signed)
Pt states that she feels that she has a hemorrhoid or possible growth on her rectum. Pt was notified to use wet wipes and preparation H to relieve the pain. Pt scheduled for an office visit on 08/02/2021 @ 2:30 with Ellouise Newer PA. Pt made aware. Pt verbalized understanding with all questions answered.

## 2021-07-31 NOTE — Telephone Encounter (Signed)
Patient called states she has a bubble on her rectum that is really painful and is seeking advise.

## 2021-08-02 ENCOUNTER — Ambulatory Visit (INDEPENDENT_AMBULATORY_CARE_PROVIDER_SITE_OTHER): Payer: Medicare HMO | Admitting: Physician Assistant

## 2021-08-02 ENCOUNTER — Encounter: Payer: Self-pay | Admitting: Physician Assistant

## 2021-08-02 VITALS — BP 100/70 | HR 96 | Ht 65.5 in | Wt 182.4 lb

## 2021-08-02 DIAGNOSIS — K5900 Constipation, unspecified: Secondary | ICD-10-CM | POA: Diagnosis not present

## 2021-08-02 DIAGNOSIS — K644 Residual hemorrhoidal skin tags: Secondary | ICD-10-CM

## 2021-08-02 MED ORDER — HYDROCORTISONE (PERIANAL) 2.5 % EX CREA: 1.0000 "application " | TOPICAL_CREAM | Freq: Two times a day (BID) | CUTANEOUS | 1 refills | Status: DC

## 2021-08-02 NOTE — Progress Notes (Signed)
Chief Complaint: "Bubble on rectum"  HPI:    Nicole [name] is a 60 year old African-American female with a past medical history as listed below including reflux and anxiety, known to Dr. Carlean Purl, who presents to clinic today with a complaint of "bubble my rectum".    01/19/2020 colonoscopy with 1 polyp in the transverse colon otherwise normal.  Pathology showed diminutive adenoma and recall was placed for 2029.    Today, the patient presents to clinic and tells me that she often has trouble with bowel movements especially over the past year and a half.  Explains that oftentimes these will be hard to get out because "my pelvic muscles are just weak because all I do is lay around".  She tells me that she often has to use a finger to apply some pressure to her perineum when passing a stool in order to help it get out.  Also tells me that she tries not to strain due to diagnosis of an umbilical hernia, but at times she has to.  About 3 to 4 days ago she was straining and when she went to wipe she felt a "bubble" near her rectum.  Tells me this was pretty painful, she has been feeling it daily with bowel movements and thinks it has gotten a little bit better but is still there.  Pain has also gotten some better.      Denies fever, chills, weight loss, change in bowel habits or symptoms that awaken her from sleep.     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 Verl Blalock 2/09   Reflux esophagitis    Sleep apnea    No CPAP   Vaginal discharge 03/02/2020    Past Surgical History:  Procedure  Laterality Date   COLONOSCOPY     ECTOPIC PREGNANCY SURGERY     EXPLORATORY LAPAROTOMY  2000   x2 (Dr. Kendall Flack)   HERPES SIMPLEX VIRUS DFA     TUBAL LIGATION  1987    Current Outpatient Medications  Medication Sig Dispense Refill   Ascorbic Acid (VITAMIN C) 100 MG tablet Take 100 mg by mouth daily.     Cholecalciferol (VITAMIN D3 PO) Take by mouth.     Cyanocobalamin (VITAMIN B-12 PO) Take by mouth.     No current facility-administered medications for this visit.    Allergies as of 08/02/2021 - Review Complete 07/11/2021  Allergen Reaction Noted   Other Itching and Hives 04/30/2012    Family History  Problem Relation Age of Onset   Hypertension Mother    Diabetes Mother    Heart disease Mother    Thyroid disease Mother    Heart attack Mother    Glaucoma Mother    Diabetes Father    Hypertension Sister    Diabetes Sister    Diabetes Sister    Hypertension Sister    Hypertension Sister    Diabetes Sister    Hypertension Brother    Diabetes Brother  Mental illness Son    Mental illness Son    Pancreatic cancer Maternal Uncle    Heart attack Maternal Grandmother    Heart attack Maternal Grandfather    Pancreatic cancer Other        family history   Colon cancer Neg Hx     Social History   Socioeconomic History   Marital status: Single    Spouse name: Not on file   Number of children: 3   Years of education: Not on file   Highest education level: Not on file  Occupational History   Not on file  Tobacco Use   Smoking status: Some Days    Packs/day: 0.25    Years: 36.00    Pack years: 9.00    Types: Cigarettes    Last attempt to quit: 12/30/2019    Years since quitting: 1.5   Smokeless tobacco: Never   Tobacco comments:    2 cigs per week  Vaping Use   Vaping Use: Never used  Substance and Sexual Activity   Alcohol use: Yes    Alcohol/week: 0.0 standard drinks    Comment: Rare glass of wine   Drug use: Yes    Types: Marijuana     Comment: daily, smokes it   Sexual activity: Never  Other Topics Concern   Not on file  Social History Narrative   Current Social History 02/16/2021        Patient lives alone in a [place] which is 2 stories. There are 13 steps with handrails up to the entrance the patient uses.       Patient's method of transportation is city bus.      The highest level of education was high school diploma.      The patient currently disabled.      Identified important Relationships are "My mother, my sisters, children, grandchildren (66)"       Pets : None       Interests / Fun: "Read, watch TV, movies, social media, talk on phone, shopping, travel"       Current Stressors: "Simple people get on my nerves,;drama."       Religious / Personal Beliefs: "Christian, [place], Pentecostal."       Other: "I stay away from people."      L. [place], BSN, RN-BC              Social Determinants of Health   Financial Resource Strain: Not on file  Food Insecurity: Food Insecurity Present   Worried About Charity fundraiser in the Last Year: Sometimes true   Arboriculturist in the Last Year: Sometimes true  Transportation Needs: Public librarian (Medical): Yes   Lack of Transportation (Non-Medical): Yes  Physical Activity: Not on file  Stress: Not on file  Social Connections: Not on file  Intimate Partner Violence: Not on file    Review of Systems:    Constitutional: No weight loss, fever or chills Cardiovascular: No chest pain  Respiratory: No SOB  Gastrointestinal: See HPI and otherwise negative   Physical Exam:  Vital signs: BP 100/70    Pulse 96    Ht 5' 5.5" (1.664 m)    Wt 182 lb 6 oz (82.7 kg)    LMP 03/03/2016 (Approximate)    BMI 29.89 kg/m    Constitutional:   Pleasant AA female appears to be in NAD, Well developed, Well nourished, alert and cooperative Respiratory: Respirations even  and unlabored. Lungs clear to auscultation bilaterally.   No  wheezes, crackles, or rhonchi.  Cardiovascular: Normal S1, S2. No MRG. Regular rate and rhythm. No peripheral edema, cyanosis or pallor.  Gastrointestinal:  Soft, nondistended, nontender. No rebound or guarding. Normal bowel sounds. No appreciable masses or hepatomegaly. Rectal: External: 1 external hemorrhoid, no fissure, somewhat tender to palpation; internal: No mass, no residue Psychiatric: Demonstrates good judgement and reason without abnormal affect or behaviors.  No recent labs or imaging.  Assessment: 1.  External hemorrhoid: At time of exam this is nonthrombosed, though I suspect it was thrombosed a couple days ago, it seems to be improving 2.  Constipation: Sounds like this may be due to pelvic floor dysfunction +/- medication she is taking  Plan: 1.  Encouraged the patient to start Hydrocortisone ointment twice daily x7-14 days.  Prescribed this for her. 2.  Encouraged sitz bath's for 15 to 20 minutes 2-3 times a day. 3.  Recommend the patient start MiraLAX on a daily basis that she is not straining for bowel movements. 4.  Patient to follow in clinic with [place] as needed.  Ellouise Newer, PA-C [facility] 08/02/2021, 2:48 PM  Cc: [name], MD

## 2021-08-02 NOTE — Patient Instructions (Addendum)
We have sent the following medications to your pharmacy for you to pick up at your convenience: Hydrocortisone ointment twice daily for 7-14 days.  Start Miralax 1 capful daily in 8 ounces of liquid.  If you are age 62 or older, your body mass index should be between 23-30. Your Body mass index is 29.89 kg/m. If this is out of the aforementioned range listed, please consider follow up with your Primary Care Provider.  If you are age 41 or younger, your body mass index should be between 19-25. Your Body mass index is 29.89 kg/m. If this is out of the aformentioned range listed, please consider follow up with your Primary Care Provider.   ________________________________________________________  The [facility] providers would like to encourage you to use [facility] to communicate with providers for non-urgent requests or questions.  Due to long hold times on the telephone, sending your provider a message by [facility] may be a faster and more efficient way to get a response.  Please allow 48 business hours for a response.  Please remember that this is for non-urgent requests.  _______________________________________________________

## 2021-08-07 ENCOUNTER — Other Ambulatory Visit: Payer: Self-pay

## 2021-08-07 ENCOUNTER — Ambulatory Visit: Payer: Medicare HMO | Attending: Internal Medicine | Admitting: Physical Therapy

## 2021-08-07 DIAGNOSIS — R252 Cramp and spasm: Secondary | ICD-10-CM | POA: Insufficient documentation

## 2021-08-07 DIAGNOSIS — M6281 Muscle weakness (generalized): Secondary | ICD-10-CM | POA: Insufficient documentation
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
## 2021-08-15 ENCOUNTER — Telehealth: Payer: Self-pay

## 2021-08-15 ENCOUNTER — Ambulatory Visit: Payer: Medicare HMO | Admitting: Physical Therapy

## 2021-08-15 NOTE — Telephone Encounter (Signed)
Called pt who stated [facility], front office, asked her to bring in the paperwork today so her doctor can fill it out.

## 2021-08-15 NOTE — Telephone Encounter (Signed)
Requesting to speak with Dr. [name] about paperwork. Please call pt back.

## 2021-08-22 ENCOUNTER — Encounter: Payer: Medicare HMO | Admitting: Physical Therapy

## 2021-08-24 ENCOUNTER — Ambulatory Visit
Admission: RE | Admit: 2021-08-24 | Discharge: 2021-08-24 | Disposition: A | Discharge status: Home / Self Care | Payer: Medicare HMO | Source: Ambulatory Visit | Attending: Internal Medicine | Admitting: Internal Medicine

## 2021-08-24 DIAGNOSIS — N63 Unspecified lump in unspecified breast: Secondary | ICD-10-CM

## 2021-08-24 DIAGNOSIS — R922 Inconclusive mammogram: Secondary | ICD-10-CM | POA: Diagnosis not present

## 2021-08-25 ENCOUNTER — Ambulatory Visit: Payer: Medicare HMO | Admitting: Obstetrics and Gynecology

## 2021-08-25 ENCOUNTER — Other Ambulatory Visit (HOSPITAL_COMMUNITY)
Admission: RE | Admit: 2021-08-25 | Discharge: 2021-08-25 | Disposition: A | Discharge status: Home / Self Care | Payer: Medicare HMO | Source: Ambulatory Visit | Attending: Obstetrics and Gynecology | Admitting: Obstetrics and Gynecology

## 2021-08-25 ENCOUNTER — Other Ambulatory Visit: Payer: Self-pay

## 2021-08-25 ENCOUNTER — Encounter: Payer: Self-pay | Admitting: Obstetrics and Gynecology

## 2021-08-25 ENCOUNTER — Ambulatory Visit (INDEPENDENT_AMBULATORY_CARE_PROVIDER_SITE_OTHER): Payer: Medicare HMO | Admitting: Obstetrics and Gynecology

## 2021-08-25 VITALS — BP 120/81 | HR 88

## 2021-08-25 DIAGNOSIS — Z113 Encounter for screening for infections with a predominantly sexual mode of transmission: Secondary | ICD-10-CM

## 2021-08-25 DIAGNOSIS — Z01419 Encounter for gynecological examination (general) (routine) without abnormal findings: Secondary | ICD-10-CM

## 2021-08-25 NOTE — Progress Notes (Signed)
Obstetrics and Gynecology Annual Patient Evaluation  Appointment Date: 08/25/2021  OBGYN Clinic: [facility]  Primary Care Provider: [name]  Chief Complaint:  Chief Complaint  Patient presents with   Gynecologic Exam    History of Present Illness: Nicole [name] is a 61 y.o. African-American [id number] (LMP: early 41s), seen for the above chief complaint.   Patient doing well  Review of Systems: Pertinent items noted in HPI and remainder of comprehensive ROS otherwise negative.   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   Morphea 07/03/2021   Inclusion cyst 05/10/2021   Other personal history of psychological trauma, not elsewhere classified 03/16/2021   Mass of soft tissue of abdomen 01/05/2021   Lump of right breast 01/04/2021   History of hyperlipidemia 11/10/2019   Overweight 09/23/2019   History of recurrent UTI (urinary tract infection) 11/12/2017   Marijuana use 11/12/2017   OSA (obstructive sleep apnea) 12/27/2015   Bipolar disorder ([place]) 04/14/2007    Past Medical History:  Past Medical History:  Diagnosis Date   Acute pain of right knee 01/04/2021   Acute pain of right shoulder 01/04/2021   Anemia    Anxiety    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 Verl Blalock 2/09   Reflux esophagitis    Sleep apnea    No CPAP   Vaginal discharge 03/02/2020    Past Surgical History:  Past Surgical History:  Procedure Laterality Date   COLONOSCOPY     ECTOPIC PREGNANCY SURGERY     EXPLORATORY LAPAROTOMY  2000   x2 (Dr. Kendall Flack)   HERPES SIMPLEX VIRUS DFA     TUBAL LIGATION  1987    Past  Obstetrical History:  OB History  Gravida Para Term Preterm AB Living  4 3 3  0 1 3  SAB IAB Ectopic Multiple Live Births  0 0 1 0 3    # Outcome Date GA Lbr Len/2nd Weight Sex Delivery Anes PTL Lv  4 Ectopic           3 Term           2 Term           1 Term             Past Gynecological History: As per HPI. History of Pap Smear(s): Yes.   Last pap 06/2020, which was negative cytology and negative hpv History of HRT use: No.  Social History:  Social History   Socioeconomic History   Marital status: Single    Spouse name: Not on file   Number of children: 3   Years of education: Not on file   Highest education level: Not on file  Occupational History   Not on file  Tobacco Use   Smoking status: Some Days    Packs/day: 0.25    Years: 36.00    Pack years: 9.00    Types: Cigarettes    Last attempt to quit: 12/30/2019    Years since quitting: 1.6   Smokeless tobacco: Never   Tobacco comments:    2 cigs per week  Vaping Use   Vaping Use: Never used  Substance and Sexual Activity   Alcohol use: Yes    Alcohol/week: 0.0 standard drinks    Comment: Rare glass of wine   Drug use: Yes    Types: Marijuana    Comment: daily, smokes it   Sexual activity: Never  Other Topics Concern   Not on file  Social History Narrative   Current Social History 02/16/2021        Patient lives alone in a [place] which is 2 stories. There are 13 steps with handrails up to the entrance the patient uses.       Patient's method of transportation is city bus.      The highest level of education was high school diploma.      The patient currently disabled.      Identified important Relationships are "My mother, my sisters, children, grandchildren (33)"       Pets : None       Interests / Fun: "Read, watch TV, movies, social media, talk on phone, shopping, travel"       Current Stressors: "Simple people get on my nerves,;drama."       Religious / Personal Beliefs: "Christian,  Corazin, Pentecostal."       Other: "I stay away from people."      L. [place]atte, BSN, RN-BC              Social Determinants of Health   Financial Resource Strain: Not on file  Food Insecurity: Food Insecurity Present   Worried About Charity fundraiser in the Last Year: Sometimes true   Arboriculturist in the Last Year: Sometimes true  Transportation Needs: Public librarian (Medical): No   Lack of Transportation (Non-Medical): Yes  Physical Activity: Not on file  Stress: Not on file  Social Connections: Not on file  Intimate Partner Violence: Not on file    Family History:  Family History  Problem Relation Age of Onset   Hypertension Mother    Diabetes Mother    Heart disease Mother    Thyroid disease Mother    Heart attack Mother    Glaucoma Mother    Diabetes Father    Hypertension Sister    Diabetes Sister    Diabetes Sister    Hypertension Sister    Hypertension Sister    Diabetes Sister    Hypertension Brother    Diabetes Brother    Mental illness Son    Mental illness Son    Pancreatic cancer Maternal Uncle    Heart attack Maternal Grandmother    Heart attack Maternal Grandfather    Pancreatic cancer Other        family history   Colon cancer Neg Hx     Health Maintenance:  Mammogram(s): Yes.   Date: yesterday (negative mammo and u/s)  Medications Sherrian M. Brugh had no medications administered during this visit. Current Outpatient Medications  Medication Sig Dispense Refill   Ascorbic Acid (VITAMIN C) 100 MG tablet Take 100 mg by mouth daily.     Cholecalciferol (VITAMIN D3 PO) Take by mouth.     Cyanocobalamin (VITAMIN B-12 PO) Take by mouth.     hydrocortisone (ANUSOL-HC) 2.5 % rectal cream Place 1 application rectally 2 (two) times daily. 30 g 1   albuterol (VENTOLIN HFA) 108 (90 Base) MCG/ACT inhaler  (Patient not taking: Reported on 08/25/2021)     No current facility-administered medications  for this visit.     Allergies Other   Physical Exam:  BP 120/81    Pulse 88    LMP 03/03/2016 (Approximate)  There is no height or weight on file to calculate BMI. General appearance: Well nourished, well developed female in no acute distress.  Neck:  Supple, normal appearance, and no thyromegaly  Cardiovascular: normal s1 and s2.  No murmurs, rubs or gallops. Respiratory:  Clear to auscultation bilateral. Normal respiratory effort Abdomen: positive bowel sounds and no masses, hernias; diffusely non tender to palpation, non distended Neuro/Psych:  Normal mood and affect.  Skin:  Warm and dry.  Lymphatic:  No inguinal lymphadenopathy.   Pelvic exam: is not limited by body habitus EGBUS: within normal limits Vagina: within normal limits and with no blood or discharge in the vault Cervix: normal appearing cervix without tenderness, discharge or lesions. Near flush with vaginal wall Uterus:  nonenlarged and non tender Adnexa:  normal adnexa and no mass, fullness, tenderness Rectovaginal: deferred  Laboratory: none  Radiology: none  Assessment: pt doing well  Plan:  1. Screening examination for STD (sexually transmitted disease) - HIV antibody (with reflex) - RPR - Hepatitis B Surface AntiGEN - Hepatitis C Antibody - Cervicovaginal ancillary only( [place])  2. Well woman exam Routine care. Pt still with some vaginal dryness and using some lubricants. Pt to let me know what she's using b/c I mentioined about vaginal moisturizers/lubricants and she may have tried some already  RTC 1 year  Aletha Halim, Brooke Bonito MD Attending Center for [employer] farm manager)

## 2021-08-26 LAB — CERVICOVAGINAL ANCILLARY ONLY
Bacterial Vaginitis (gardnerella): NEGATIVE
Candida Glabrata: NEGATIVE
Candida Vaginitis: POSITIVE — AB
Chlamydia: NEGATIVE
Comment: NEGATIVE
Comment: NEGATIVE
Comment: NEGATIVE
Comment: NEGATIVE
Comment: NEGATIVE
Comment: NORMAL
Neisseria Gonorrhea: NEGATIVE
Trichomonas: NEGATIVE

## 2021-08-26 LAB — HIV ANTIBODY (ROUTINE TESTING W REFLEX): HIV Screen 4th Generation wRfx: NONREACTIVE

## 2021-08-26 LAB — HEPATITIS B SURFACE ANTIGEN: Hepatitis B Surface Ag: NEGATIVE

## 2021-08-26 LAB — RPR: RPR Ser Ql: NONREACTIVE

## 2021-08-26 LAB — HEPATITIS C ANTIBODY: Hep C Virus Ab: <0.1 s/co ratio (ref 0.0–0.9)

## 2021-08-27 MED ORDER — FLUCONAZOLE 150 MG PO TABS: 150.0000 mg | ORAL_TABLET | ORAL | 0 refills | Status: AC

## 2021-08-27 NOTE — Addendum Note (Signed)
Addended by: Aletha Halim on: 08/27/2021 07:37 AM   Modules accepted: Orders

## 2021-08-28 ENCOUNTER — Telehealth: Payer: Self-pay | Admitting: *Deleted

## 2021-08-28 NOTE — Telephone Encounter (Addendum)
-----   Message from Aletha Halim, MD sent at 08/27/2021  7:36 AM EST ----- Please let Nicole [name] know that she has a yeast infections and medications sent in for Nicole [name] and that Nicole [name] bloodwork is normal. Thank you  1/9  1015  Called pt and informed Nicole [name] of message from Dr. Ilda Basset regarding test results and medication.  She voiced understanding.

## 2021-08-29 ENCOUNTER — Encounter: Payer: Medicare HMO | Admitting: Physical Therapy

## 2021-08-29 DIAGNOSIS — J392 Other diseases of pharynx: Secondary | ICD-10-CM | POA: Diagnosis not present

## 2021-08-31 NOTE — Progress Notes (Signed)
Nicole [name] presents for routine follow-up, most recently for a new diagnosis of morphea for which she has been referred to dermatology (appointments upcoming) and for results of recent mammography which identified a skin finding but no breast concerns, which she will also discuss  with the dermatologist.  She has been under significant stress as she has read quite a lot on the Internet and feels that her morphea is going to be a progressive disease where her body is going to "eat itself and dissolve".  Nicole [name] has a history of health anxiety and has very strong feelings about how the body works.  She is experiencing daily generalized headaches and chronic fatigue and notes that her sleep patterns have been poor for some time.  She tries to go to sleep between 7 and 9 PM, though keeps two lamps on and the TV is on 24/7.  She awakens around midnight and may stay up until around noon, when she may lay down for a nap.  She also carries a diagnosis of sleep apnea and did not tolerate a CPAP machine years ago when it was prescribed.  She now recognizes that she will need to be retested and is willing to try again.  She was planning to self refer to neurology and proceed with sleep study.  She is concerned about a breakout of her toes and feet which occurs when she eats bread.  Toe webs and sides of her feet breakout in pruritic bumps; she feels that it is spread to other parts of her body when she touches-   SHe is fearful that this is fungus and that it is on the inside of her body where it could be harmful to her organs.  She has read that while everyone has fungus in their body's some people upset the balance by eating bread, causing the fungus to show up on skin.  She plans to order a medication advertised on TV or internet.  WAs prescribed fluconazole 150 mg on 08/27/21 "to flush the fungus out".   Feels that she will need referral to a new ENT, who she saw recently for problems swallowing.  Exam was normal but  she was not reassured.   Current Outpatient Medications:    albuterol (VENTOLIN HFA) 108 (90 Base) MCG/ACT inhaler, , Disp: , Rfl:    Ascorbic Acid (VITAMIN C) 100 MG tablet, Take 100 mg by mouth daily., Disp: , Rfl:    Cholecalciferol (VITAMIN D3 PO), Take by mouth., Disp: , Rfl:    Cyanocobalamin (VITAMIN B-12 PO), Take by mouth., Disp: , Rfl:    fluconazole (DIFLUCAN) 150 MG tablet, Take 1 tablet (150 mg total) by mouth every 3 (three) days for 3 doses., Disp: 3 tablet, Rfl: 0   hydrocortisone (ANUSOL-HC) 2.5 % rectal cream, Place 1 application rectally 2 (two) times daily., Disp: 30 g, Rfl: 1   BP 126/66 (BP Location: Right Arm, Patient Position: Sitting, Cuff Size: Small)    Pulse 68    Temp 97.9 F (36.6 C) (Oral)    Ht 5\' 5"  (1.651 m)    Wt 188 lb 4.8 oz (85.4 kg)    LMP 03/03/2016 (Approximate)    SpO2 98%    BMI 31.33 kg/m   Exam: Nicely dressed and groomed woman in no distress though she does become anxious when describing and pointing out her various medical concerns.  She indicates an area of her left temple/left upper forehead which is being affected by morphea-I cannot detect this  on palpation nor visibly.  The morphea was biopsied on the left lower leg medial aspect; eschar remains.  She indicates that the morphea lesions are on the bilateral lower legs, anteromedial tibias.  It is very difficult for me to palpate any difference in the skin, which does not seem to be tethered, thickened, nor atrophied.  She can see the demarcation from her perspective looking down, which I did not try to do.  Her right foot has maceration of the webs between third and fourth and fourth and fifth toes and some peeling along the lateral sole and lateral foot.  Oropharynx is moist, symmetric, no cobblestoning, no localized erythema or exudate.  An area of concern where she can feel a mass or a bump on L lateral abd skin - I cannot feel a discrete thickening.  SHe is reassured that the green spot is a vein.     Assessment and plan (see also problem based documentation):  Morphea:  Biopsy diagnosed.   09/05/21 dermatology on [place] for second opinion and f/u if she likes- Another derm practice referral last year she didn't attend then, but rescheduled for 09/2021.  She plans to attend both and select the when she feels most comfortable with.  I see rheumatology appts scheduled as well, Dr. Estanislado Pandy 1/25 and 10/04/21.  She has been treated for candida vaginitis (wet prep) with fluconazole by GYN Dr. Aletha Halim - 3 tabs, starting 08/27/21.  Problems with swallowing appear to be functional.  Reassurance given.  Poor sleep hygiene and sleep apnea untreated are leading to insufficient restorative sleep and chronic fatigue.  I recommended that she turn the TV off when she wishes to go to sleep and to keep the room dark.  This seems surprising to her, but she endorsed willingness to try.  She is interested in some relaxation techniques and I will refer to Dr. Carolynne Edouard.  Patient plans self referral to neurologist for updating of sleep study and CPAP fitting.  She wished to be checked for diabetes today "my family has it" - A1c 5.8, prediabetes range, no treatment at this time, we will discuss at a future visit.  Nicole [name] is dealing with significant health anxiety.  Hx of bipolar with paranoia.  She will do well with frequent visits with same provider (hopefully I can keep her on a regular schedule), careful discussions, acknowledgment and legitimization, and reassurance.  SHe has had many visits with many providers and I will need to carefully review her history.

## 2021-09-01 ENCOUNTER — Ambulatory Visit (INDEPENDENT_AMBULATORY_CARE_PROVIDER_SITE_OTHER): Payer: Medicare HMO | Admitting: Internal Medicine

## 2021-09-01 VITALS — BP 126/66 | HR 68 | Temp 97.9°F | Ht 65.0 in | Wt 188.3 lb

## 2021-09-01 DIAGNOSIS — R7303 Prediabetes: Secondary | ICD-10-CM | POA: Diagnosis not present

## 2021-09-01 DIAGNOSIS — G4733 Obstructive sleep apnea (adult) (pediatric): Secondary | ICD-10-CM | POA: Diagnosis not present

## 2021-09-01 DIAGNOSIS — G479 Sleep disorder, unspecified: Secondary | ICD-10-CM | POA: Diagnosis not present

## 2021-09-01 DIAGNOSIS — N63 Unspecified lump in unspecified breast: Secondary | ICD-10-CM | POA: Diagnosis not present

## 2021-09-01 LAB — POCT GLYCOSYLATED HEMOGLOBIN (HGB A1C): Hemoglobin A1C: 5.8 % — AB (ref 4.0–5.6)

## 2021-09-01 LAB — GLUCOSE, CAPILLARY: Glucose-Capillary: 90 mg/dL (ref 70–99)

## 2021-09-01 NOTE — Progress Notes (Deleted)
Office Visit Note  Patient: Nicole [name]             Date of Birth: 02-22-1961           MRN: [id number]             PCP: [name], MD Referring: Velna Ochs, MD Visit Date: 09/13/2021 Occupation: @GUAROCC @  Subjective:  No chief complaint on file.   History of Present Illness: Nicole [name] is a 61 y.o. female ***   Activities of Daily Living:  Patient reports morning stiffness for *** {minute/hour:19697}.   Patient {ACTIONS;DENIES/REPORTS:21021675::"Denies"} nocturnal pain.  Difficulty dressing/grooming: {ACTIONS;DENIES/REPORTS:21021675::"Denies"} Difficulty climbing stairs: {ACTIONS;DENIES/REPORTS:21021675::"Denies"} Difficulty getting out of chair: {ACTIONS;DENIES/REPORTS:21021675::"Denies"} Difficulty using hands for taps, buttons, cutlery, and/or writing: {ACTIONS;DENIES/REPORTS:21021675::"Denies"}  No Rheumatology ROS completed.   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   Morphea 07/03/2021   Inclusion cyst 05/10/2021   Other personal history of psychological trauma, not elsewhere classified 03/16/2021   Mass of soft tissue of abdomen 01/05/2021   Lump of right breast 01/04/2021   History of hyperlipidemia 11/10/2019   Overweight 09/23/2019   History of recurrent UTI (urinary tract infection) 11/12/2017   Marijuana use 11/12/2017   OSA (obstructive sleep apnea) 12/27/2015   Bipolar disorder ([place]) 04/14/2007    Past Medical History:  Diagnosis Date   Acute pain of right knee 01/04/2021   Acute pain of right shoulder 01/04/2021   Anemia    Anxiety    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 Verl Blalock 2/09    Reflux esophagitis    Sleep apnea    No CPAP   Vaginal discharge 03/02/2020    Family History  Problem Relation Age of Onset   Hypertension Mother    Diabetes Mother    Heart disease Mother    Thyroid disease Mother    Heart attack Mother    Glaucoma Mother    Diabetes Father    Hypertension Sister    Diabetes Sister    Diabetes Sister    Hypertension Sister    Hypertension Sister    Diabetes Sister    Hypertension Brother    Diabetes Brother    Mental illness Son    Mental illness Son    Pancreatic cancer Maternal Uncle    Heart attack Maternal Grandmother    Heart attack Maternal Grandfather    Pancreatic cancer Other        family history   Colon cancer Neg Hx    Past Surgical History:  Procedure Laterality Date   COLONOSCOPY     ECTOPIC PREGNANCY SURGERY     EXPLORATORY LAPAROTOMY  2000   x2 (Dr. Kendall Flack)   [place] VIRUS DFA     [place]   Social History   Social History Narrative   Current Social History 02/16/2021        Patient lives alone in a [place] which is 2 stories. There are 13 steps with handrails up to the entrance the patient uses.       Patient's method of transportation is city bus.  The highest level of education was high school diploma.      The patient currently disabled.      Identified important Relationships are "My mother, my sisters, children, grandchildren (20)"       Pets : None       Interests / Fun: "Read, watch TV, movies, social media, talk on phone, shopping, travel"       Current Stressors: "Simple people get on my nerves,;drama."       Religious / Personal Beliefs: "Christian, [place], Pentecostal."       Other: "I stay away from people."      L. Ducatte, BSN, RN-BC              Immunization History  Administered Date(s) Administered   Td 10/18/2005   Tdap 10/27/2012     Objective: Vital Signs: LMP 03/03/2016 (Approximate)    Physical Exam   Musculoskeletal Exam:  ***  CDAI Exam: CDAI Score: -- Patient Global: --; Provider Global: -- Swollen: --; Tender: -- Joint Exam 09/13/2021   No joint exam has been documented for this visit   There is currently no information documented on the homunculus. Go to the Rheumatology activity and complete the homunculus joint exam.  Investigation: No additional findings.  Imaging: US BREAST LTD UNI RIGHT INC AXILLA  Result Date: 08/24/2021 CLINICAL DATA:  61 year old female with area of skin discoloration within the UPPER-OUTER RIGHT breast and for annual bilateral mammogram. Patient with history of morphea. EXAM: DIGITAL DIAGNOSTIC BILATERAL MAMMOGRAM WITH TOMOSYNTHESIS AND CAD; ULTRASOUND RIGHT BREAST LIMITED TECHNIQUE: Bilateral digital diagnostic mammography and breast tomosynthesis was performed. The images were evaluated with computer-aided detection.; Targeted ultrasound examination of the right breast was performed COMPARISON:  Previous exam(s). ACR Breast Density Category b: There are scattered areas of fibroglandular density. FINDINGS: 2D/3D full field views of both breasts and a spot compression view of the RIGHT breast demonstrate no suspicious mass, distortion or worrisome calcifications. On physical exam, a small area of raised skin discoloration at the 11:30 position of the RIGHT breast 7 cm from the nipple is noted. Targeted ultrasound is performed, showing minimal skin thickening at the 11:30 position of the RIGHT breast 7 cm from the nipple without underlying sonographic abnormality. IMPRESSION: 1. Small area of raised skin discoloration at the 11:30 position of the RIGHT breast 7 cm from the nipple, without underlying breast mammographic or sonographic abnormality. Dermatology follow-up is recommended. 2. No mammographic evidence of breast malignancy. RECOMMENDATION: Dermatology follow-up. The patient states she will follow-up with her dermatologist. Bilateral screening mammogram in 1 year. I have discussed  the findings and recommendations with the patient. If applicable, a reminder letter will be sent to the patient regarding the next appointment. BI-RADS CATEGORY  1: Negative. Electronically Signed   By: Margarette Canada M.D.   On: 08/24/2021 09:25  MM DIAG BREAST TOMO BILATERAL  Result Date: 08/24/2021 CLINICAL DATA:  61 year old female with area of skin discoloration within the UPPER-OUTER RIGHT breast and for annual bilateral mammogram. Patient with history of morphea. EXAM: DIGITAL DIAGNOSTIC BILATERAL MAMMOGRAM WITH TOMOSYNTHESIS AND CAD; ULTRASOUND RIGHT BREAST LIMITED TECHNIQUE: Bilateral digital diagnostic mammography and breast tomosynthesis was performed. The images were evaluated with computer-aided detection.; Targeted ultrasound examination of the right breast was performed COMPARISON:  Previous exam(s). ACR Breast Density Category b: There are scattered areas of fibroglandular density. FINDINGS: 2D/3D full field views of both breasts and a spot compression view of the RIGHT breast demonstrate no suspicious mass, distortion or worrisome  calcifications. On physical exam, a small area of raised skin discoloration at the 11:30 position of the RIGHT breast 7 cm from the nipple is noted. Targeted ultrasound is performed, showing minimal skin thickening at the 11:30 position of the RIGHT breast 7 cm from the nipple without underlying sonographic abnormality. IMPRESSION: 1. Small area of raised skin discoloration at the 11:30 position of the RIGHT breast 7 cm from the nipple, without underlying breast mammographic or sonographic abnormality. Dermatology follow-up is recommended. 2. No mammographic evidence of breast malignancy. RECOMMENDATION: Dermatology follow-up. The patient states she will follow-up with her dermatologist. Bilateral screening mammogram in 1 year. I have discussed the findings and recommendations with the patient. If applicable, a reminder letter will be sent to the patient regarding the next  appointment. BI-RADS CATEGORY  1: Negative. Electronically Signed   By: Margarette Canada M.D.   On: 08/24/2021 09:25   Recent Labs: Lab Results  Component Value Date   WBC 4.5 04/07/2021   HGB 12.6 04/07/2021   PLT 222.0 04/07/2021   NA 139 04/07/2021   K 3.9 04/07/2021   CL 104 04/07/2021   CO2 25 04/07/2021   GLUCOSE 78 04/07/2021   BUN 13 04/07/2021   CREATININE 0.86 04/07/2021   BILITOT 0.6 04/07/2021   ALKPHOS 65 04/07/2021   AST 15 04/07/2021   ALT 16 04/07/2021   PROT 7.3 04/07/2021   ALBUMIN 4.6 04/07/2021   CALCIUM 9.4 04/07/2021   GFRAA 85 09/25/2019   QFTBGOLD Negative 03/14/2017    Speciality Comments: No specialty comments available.  Procedures:  No procedures performed Allergies: Other   Assessment / Plan:     Visit Diagnoses: Morphea  OSA (obstructive sleep apnea)  Marijuana use  History of recurrent UTI (urinary tract infection)  History of hyperlipidemia  History of bipolar disorder  Orders: No orders of the defined types were placed in this encounter.  No orders of the defined types were placed in this encounter.   Face-to-face time spent with patient was *** minutes. Greater than 50% of time was spent in counseling and coordination of care.  Follow-Up Instructions: No follow-ups on file.   Ofilia Neas, PA-C  Note - This record has been created using Dragon software.  Chart creation errors have been sought, but may not always  have been located. Such creation errors do not reflect on  the standard of medical care.

## 2021-09-01 NOTE — Assessment & Plan Note (Signed)
Due to poor sleep and chronic daily headaches, she is willing to return to neurology for sleep study and fitting of CPAP, which she didn't tolerate in the past.

## 2021-09-01 NOTE — Patient Instructions (Addendum)
Nicole [name]  It was great to catch up with you today!  We discussed your skin issues and sleep problems, and we are checking for diabetes today.  I'm pleased that you have a dermatologist visit coming up so that you can get more clarity on your conditions.  I agree with the need to reevaluate your sleep apnea and Dr. Theodis Shove will reach out to help you work on relaxation techniques to help you get into a good sleep pattern.  Let's try to get together in about 4 months.  Take care and stay well,  Dr. [name]

## 2021-09-01 NOTE — Assessment & Plan Note (Signed)
Mammography shows normal breasts, though a skin lesion of R breast was recommended for derm f/u, pending. Mammogram 1 year.

## 2021-09-05 ENCOUNTER — Encounter: Payer: Medicare HMO | Admitting: Physical Therapy

## 2021-09-07 ENCOUNTER — Encounter: Payer: Self-pay | Admitting: Internal Medicine

## 2021-09-13 ENCOUNTER — Ambulatory Visit: Payer: Medicare HMO | Admitting: Rheumatology

## 2021-09-13 DIAGNOSIS — L940 Localized scleroderma [morphea]: Secondary | ICD-10-CM

## 2021-09-13 DIAGNOSIS — G4733 Obstructive sleep apnea (adult) (pediatric): Secondary | ICD-10-CM

## 2021-09-13 DIAGNOSIS — F1290 Cannabis use, unspecified, uncomplicated: Secondary | ICD-10-CM

## 2021-09-13 DIAGNOSIS — Z8639 Personal history of other endocrine, nutritional and metabolic disease: Secondary | ICD-10-CM

## 2021-09-13 DIAGNOSIS — Z87440 Personal history of urinary (tract) infections: Secondary | ICD-10-CM

## 2021-09-13 DIAGNOSIS — Z8659 Personal history of other mental and behavioral disorders: Secondary | ICD-10-CM

## 2021-09-14 ENCOUNTER — Encounter: Payer: Medicare HMO | Admitting: Internal Medicine

## 2021-09-22 DIAGNOSIS — F3131 Bipolar disorder, current episode depressed, mild: Secondary | ICD-10-CM | POA: Diagnosis not present

## 2021-09-22 DIAGNOSIS — F319 Bipolar disorder, unspecified: Secondary | ICD-10-CM | POA: Diagnosis not present

## 2021-09-22 DIAGNOSIS — F3175 Bipolar disorder, in partial remission, most recent episode depressed: Secondary | ICD-10-CM | POA: Diagnosis not present

## 2021-09-22 DIAGNOSIS — F1210 Cannabis abuse, uncomplicated: Secondary | ICD-10-CM | POA: Diagnosis not present

## 2021-09-22 DIAGNOSIS — F3132 Bipolar disorder, current episode depressed, moderate: Secondary | ICD-10-CM | POA: Diagnosis not present

## 2021-09-26 DIAGNOSIS — L940 Localized scleroderma [morphea]: Secondary | ICD-10-CM | POA: Diagnosis not present

## 2021-09-28 ENCOUNTER — Ambulatory Visit: Payer: Medicare HMO | Admitting: Behavioral Health

## 2021-09-28 DIAGNOSIS — F331 Major depressive disorder, recurrent, moderate: Secondary | ICD-10-CM

## 2021-09-28 DIAGNOSIS — G479 Sleep disorder, unspecified: Secondary | ICD-10-CM

## 2021-09-28 DIAGNOSIS — F419 Anxiety disorder, unspecified: Secondary | ICD-10-CM

## 2021-09-28 NOTE — BH Specialist Note (Signed)
Integrated Behavioral Health via Telemedicine Visit  09/28/2021 [name] [id number]  Number of [place] Clinician visits: First Session Start time: 9:30am Session End time: 10:00am Total time in minutes: 30 min  Referring Provider: Dr. Lenise Herald, MD Patient/Family location: Pt is home in private [facility] Provider location: Working remotely All persons participating in visit: Pt & Clinician Types of Service: Health Promotion and Introduction only  I connected with Nicole [name] and/or [name]'s  self  via  Telephone or Video Enabled Telemedicine Application  (Video is Caregility application) and verified that I am speaking with the correct person using two identifiers. Discussed confidentiality: Yes   I discussed the limitations of telemedicine and the availability of in person appointments.  Discussed there is a possibility of technology failure and discussed alternative modes of communication if that failure occurs.  I discussed that engaging in this telemedicine visit, they consent to the provision of behavioral healthcare and the services will be billed under their insurance.  Patient and/or legal guardian expressed understanding and consented to Telemedicine visit: Yes   Presenting Concerns: Patient and/or family reports the following symptoms/concerns:  Duration of problem: several years; Severity of problem: moderate  Patient and/or Family's Strengths/Protective Factors: Concrete supports in place (healthy food, safe environments, etc.) and Sense of purpose  Goals Addressed: Patient will:  Reduce symptoms of: anxiety, depression, and mood instability   Increase knowledge and/or ability of: coping skills, self-management skills, and stress reduction   Demonstrate ability to: Increase healthy adjustment to current life circumstances and inc emot'l regulation skills & inhibition of impulsive thoughts in conversation w/others  Progress towards  Goals: Estb'd today: Pt will attend IBH telehealth sessions as scheduled & use tools provided to learn more about Bipolar d/o & how it manifests for you.  Interventions: Interventions utilized:  Supportive Counseling and Psychoeducation and/or Health Education Standardized Assessments completed:  screeners prn  Patient and/or Family Response: Pt receptive to call today & engaged in open discussion of her concerns. Pt requests future appts.  Assessment: Patient currently experiencing mtl health issues since a young person in [place]. Pt was active in sch, playing basketball & running track. Pt has been married twice. There is a Hx of Inter-marriage in her Family that can be disturbing.   Pt has found the language other's use to discuss mental health is stigmatizing, stereotyping & diminishes her as a woman. Pt has a Hx of trauma, anx/dep, & uses THC freq'ly to calm her nerves.   Pt was formerly in psychotherapy w/Mr. Charles @ [facility]. It was helpful.  Pt wants to work. She has done a variety of jobs across her lifetime. Currently, she does not have transportation issues as she uses her Card.  Patient may benefit from psychotherapy session via IBH telehealth once monthly.  Plan: Follow up with behavioral health clinician on : on month for 60 min Behavioral recommendations: Cont to explore job options-you will find something. Gain skills to cope/control anx/dep. Work on sleep hygiene & efforts to curb anx near bedtime/sleep Hygiene psychoedu Referral(s): [place] (In Clinic)  I discussed the assessment and treatment plan with the patient and/or parent/guardian. They were provided an opportunity to ask questions and all were answered. They agreed with the plan and demonstrated an understanding of the instructions.   They were advised to call back or seek an in-person evaluation if the symptoms worsen or if the condition fails to improve as  anticipated.  [place] L  Badr Piedra, LMFT

## 2021-10-04 ENCOUNTER — Ambulatory Visit: Payer: Medicare HMO | Admitting: Rheumatology

## 2021-10-23 ENCOUNTER — Encounter: Payer: Self-pay | Admitting: Diagnostic Neuroimaging

## 2021-10-23 ENCOUNTER — Ambulatory Visit: Payer: Medicare HMO | Admitting: Diagnostic Neuroimaging

## 2021-10-25 ENCOUNTER — Telehealth: Payer: Self-pay | Admitting: Behavioral Health

## 2021-10-25 ENCOUNTER — Ambulatory Visit: Payer: Medicaid Other | Admitting: Behavioral Health

## 2021-10-25 NOTE — Telephone Encounter (Signed)
Lft msg w/Pt re: IBH call today. Pt phone kept ringing on 2nd, & 3rd attempt to reach her w/o possibility of lvg a msg. ? ?Dr. Theodis Shove ?

## 2021-10-25 NOTE — BH Specialist Note (Deleted)
Integrated Behavioral Health via Telemedicine Visit ? ?10/25/2021 ?Izora Ribas Mcduffey ?[id number] ? ?Number of [place] Clinician visits: No data recorded ?Session Start time: No data recorded  ?Session End time: No data recorded ?Total time in minutes: No data recorded ? ?Referring Provider: *** ?Patient/Family location: *** ?[facility] Provider location: *** ?All persons participating in visit: *** ?Types of Service: {CHL AMB TYPE OF [phone]} ? ?I connected with Izora Ribas Gerbino and/or Izora Ribas Staller's {family members:20773} via  Telephone or Video Enabled Telemedicine Application  (Video is Caregility application) and verified that I am speaking with the correct person using two identifiers. Discussed confidentiality: {YES/NO:21197} ? ?I discussed the limitations of telemedicine and the availability of in person appointments.  Discussed there is a possibility of technology failure and discussed alternative modes of communication if that failure occurs. ? ?I discussed that engaging in this telemedicine visit, they consent to the provision of behavioral healthcare and the services will be billed under their insurance. ? ?Patient and/or legal guardian expressed understanding and consented to Telemedicine visit: {YES/NO:21197} ? ?Presenting Concerns: ?Patient and/or family reports the following symptoms/concerns: *** ?Duration of problem: ***; Severity of problem: {Mild/Moderate/Severe:20260} ? ?Patient and/or Family's Strengths/Protective Factors: ?{CHL AMB BH PROTECTIVE FACTORS:[phone]} ? ?Goals Addressed: ?Patient will: ? Reduce symptoms of: {IBH Symptoms:21014056}  ? Increase knowledge and/or ability of: {IBH Patient Tools:21014057}  ? Demonstrate ability to: {IBH Goals:21014053} ? ?Progress towards Goals: ?{CHL AMB BH PROGRESS TOWARDS FAOZH:0865784696} ? ?Interventions: ?Interventions utilized:  {IBH Interventions:21014054} ?Standardized Assessments completed: {IBH Screening  Tools:21014051} ? ?Patient and/or Family Response: *** ? ?Assessment: ?Patient currently experiencing ***.  ? ?Patient may benefit from ***. ? ?Plan: ?Follow up with behavioral health clinician on : *** ?Behavioral recommendations: *** ?Referral(s): {IBH Referrals:21014055} ? ?I discussed the assessment and treatment plan with the patient and/or parent/guardian. They were provided an opportunity to ask questions and all were answered. They agreed with the plan and demonstrated an understanding of the instructions. ?  ?They were advised to call back or seek an in-person evaluation if the symptoms worsen or if the condition fails to improve as anticipated. ? ?Donnetta Hutching, LMFT ?

## 2021-10-29 NOTE — Progress Notes (Deleted)
? ?Office Visit Note ? ?Patient: Nicole [name]             ?Date of Birth: [birth year]           ?MRN: [id number]             ?PCP: [name], MD ?Referring: Velna Ochs, MD ?Visit Date: 10/30/2021 ?Occupation: '@GUAROCC'$ @ ? ?Subjective:  ?No chief complaint on file. ? ? ?History of Present Illness: Deandra Goering Nath is a 61 y.o. female here for evaluation of morphea. She has a history of OSA and bipolar disorder. She was fairly recently diagnosed by left leg skin punch biopsy with dermatology in October, but symptoms started since years ago. She saw Dr. Elvera Lennox in February recommending evaluation for systemic disease due to unusual skin distribution and also deep layer of involvement. She is concerned about systemic disease involvement.***  ? ?Activities of Daily Living:  ?Patient reports morning stiffness for *** {minute/hour:19697}.   ?Patient {ACTIONS;DENIES/REPORTS:21021675::"Denies"} nocturnal pain.  ?Difficulty dressing/grooming: {ACTIONS;DENIES/REPORTS:21021675::"Denies"} ?Difficulty climbing stairs: {ACTIONS;DENIES/REPORTS:21021675::"Denies"} ?Difficulty getting out of chair: {ACTIONS;DENIES/REPORTS:21021675::"Denies"} ?Difficulty using hands for taps, buttons, cutlery, and/or writing: {ACTIONS;DENIES/REPORTS:21021675::"Denies"} ? ?No Rheumatology ROS completed.  ? ?PMFS History:  ?Patient Active Problem List  ? Diagnosis Date Noted  ? Sleep difficulties 09/01/2021  ? 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  ? Morphea 07/03/2021  ? Inclusion cyst 05/10/2021  ? Other personal history of psychological trauma, not elsewhere classified 03/16/2021  ? Mass of soft tissue of abdomen 01/05/2021  ? Nodule of skin of breast 01/04/2021  ? History of hyperlipidemia 11/10/2019  ? Overweight 09/23/2019  ? History of recurrent UTI (urinary tract infection) 11/12/2017  ? Marijuana use 11/12/2017  ? OSA (obstructive sleep apnea) 12/27/2015  ? Bipolar disorder ([place]) 04/14/2007  ?  ?Past Medical History:  ?Diagnosis Date  ?  Acute pain of right knee 01/04/2021  ? Acute pain of right shoulder 01/04/2021  ? Anemia   ? Anxiety   ? 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 Depression   ? bipolar  ? Gastroesophageal reflux disease 12/16/2009  ?    ? GERD (gastroesophageal reflux disease)   ? History of migraine headaches   ? headaches reported as migraines  ? Hypercholesterolemia   ? Hypotension   ? Obesity   ? Palpitations   ? negative cardiac w/u per Dr. Verl Blalock 2/09  ? Reflux esophagitis   ? Sleep apnea   ? No CPAP  ? Vaginal discharge 03/02/2020  ?  ?Family History  ?Problem Relation Age of Onset  ? Hypertension Mother   ? Diabetes Mother   ? Heart disease Mother   ? Thyroid disease Mother   ? Heart attack Mother   ? Glaucoma Mother   ? Diabetes Father   ? Hypertension Sister   ? Diabetes Sister   ? Diabetes Sister   ? Hypertension Sister   ? Hypertension Sister   ? Diabetes Sister   ? Hypertension Brother   ? Diabetes Brother   ? Mental illness Son   ? Mental illness Son   ? Pancreatic cancer Maternal Uncle   ? Heart attack Maternal Grandmother   ? Heart attack Maternal Grandfather   ? Pancreatic cancer Other   ?     family history  ? Colon cancer Neg Hx   ? ?Past Surgical History:  ?Procedure Laterality Date  ? COLONOSCOPY    ? ECTOPIC PREGNANCY SURGERY    ? EXPLORATORY LAPAROTOMY  2000  ? x2 (Dr. Kendall Flack)  ? HERPES SIMPLEX  VIRUS DFA    ? TUBAL LIGATION  1987  ? ?Social History  ? ?Social History Narrative  ? Current Social History 02/16/2021    ?   ? Patient lives alone in a [place] which is 2 stories. There are 13 steps with handrails up to the entrance the patient uses.   ?   ? Patient's method of transportation is city bus.  ?   ? The highest level of education was high school diploma.  ?   ? The patient currently disabled.  ?   ? Identified important Relationships are "My mother, my sisters, children, grandchildren (55)"   ?   ? Pets : None  ?    ? Interests / Fun: "Read, watch TV, movies, social media, talk on phone, shopping,  travel"   ?   ? Current Stressors: "Simple people get on my nerves,;drama."   ?   ? Religious / Personal Beliefs: "Christian, [place], Pentecostal."   ?   ? Other: "I stay away from people."  ?   ? 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   ?    ?   ? ?Immunization History  ?Administered Date(s) Administered  ? Td 10/18/2005  ? Tdap 10/27/2012  ?  ? ?Objective: ?Vital Signs: LMP 03/03/2016 (Approximate)   ? ?Physical Exam  ? ?Musculoskeletal Exam: *** ? ?CDAI Exam: ?CDAI Score: -- ?Patient Global: --; Provider Global: -- ?Swollen: --; Tender: -- ?Joint Exam 10/30/2021  ? ?No joint exam has been documented for this visit  ? ?There is currently no information documented on the homunculus. Go to the Rheumatology activity and complete the homunculus joint exam. ? ?Investigation: ?No additional findings. ? ?Imaging: ?No results found. ? ?Recent Labs: ?Lab Results  ?Component Value Date  ? WBC 4.5 04/07/2021  ? HGB 12.6 04/07/2021  ? PLT 222.0 04/07/2021  ? NA 139 04/07/2021  ? K 3.9 04/07/2021  ? CL 104 04/07/2021  ? CO2 25 04/07/2021  ? GLUCOSE 78 04/07/2021  ? BUN 13 04/07/2021  ? CREATININE 0.86 04/07/2021  ? BILITOT 0.6 04/07/2021  ? ALKPHOS 65 04/07/2021  ? AST 15 04/07/2021  ? ALT 16 04/07/2021  ? PROT 7.3 04/07/2021  ? ALBUMIN 4.6 04/07/2021  ? CALCIUM 9.4 04/07/2021  ? GFRAA 85 09/25/2019  ? QFTBGOLD Negative 03/14/2017  ? ? ?Speciality Comments: No specialty comments available. ? ?Procedures:  ?No procedures performed ?Allergies: Other  ? ?Assessment / Plan:     ?Visit Diagnoses: No diagnosis found. ? ?Orders: ?No orders of the defined types were placed in this encounter. ? ?No orders of the defined types were placed in this encounter. ? ? ?Face-to-face time spent with patient was *** minutes. Greater than 50% of time was spent in counseling and coordination of care. ? ?Follow-Up Instructions: No follow-ups on file. ? ? ?Collier Salina, MD ? ?Note - This record has been created using [employer].  ?Chart creation  errors have been sought, but may not always  ?have been located. Such creation errors do not reflect on  ?the standard of medical care. ? ?

## 2021-10-30 ENCOUNTER — Ambulatory Visit: Payer: Medicaid Other | Admitting: Internal Medicine

## 2021-10-30 DIAGNOSIS — L309 Dermatitis, unspecified: Secondary | ICD-10-CM | POA: Diagnosis not present

## 2021-11-06 ENCOUNTER — Ambulatory Visit: Payer: Medicare HMO | Admitting: Internal Medicine

## 2021-11-08 ENCOUNTER — Encounter: Payer: Self-pay | Admitting: Pulmonary Disease

## 2021-11-08 ENCOUNTER — Ambulatory Visit: Payer: Medicare Other | Admitting: Pulmonary Disease

## 2021-11-08 ENCOUNTER — Other Ambulatory Visit: Payer: Self-pay

## 2021-11-08 ENCOUNTER — Ambulatory Visit (INDEPENDENT_AMBULATORY_CARE_PROVIDER_SITE_OTHER): Payer: Medicare Other | Admitting: Pulmonary Disease

## 2021-11-08 VITALS — BP 126/68 | HR 90 | Temp 98.4°F | Ht 65.5 in | Wt 186.8 lb

## 2021-11-08 DIAGNOSIS — R079 Chest pain, unspecified: Secondary | ICD-10-CM | POA: Diagnosis not present

## 2021-11-08 DIAGNOSIS — L940 Localized scleroderma [morphea]: Secondary | ICD-10-CM

## 2021-11-08 DIAGNOSIS — R0602 Shortness of breath: Secondary | ICD-10-CM | POA: Diagnosis not present

## 2021-11-08 LAB — D-DIMER, QUANTITATIVE: D-Dimer, Quant: 0.23 mcg/mL FEU (ref <0.50)

## 2021-11-08 NOTE — Progress Notes (Signed)
? ?      ?Nicole [name]    [id number]    May 09, 1961 ? ?Primary Care Physician:Williams, Elaina Pattee, MD ? ?Referring Physician: [name], MD ?[facility] ?[place],  [place] [address] ? ?Chief complaint: Follow-up for lung pain ? ?HPI: ?61 year old with history of GERD, depression, sleep apnea ?Complains of throat irritation, chest tightness.  This usually occurs after she smokes marijuana.  No cough, sputum production, fevers, chills ?Evaluated by Dr. Redmond Baseman, ENT with no abnormalities noted.  She was advised to quit smoking. ?Not on inhalers at present.  She has a diagnosis of sleep apnea but intolerant of CPAP. ?Being evaluated for rectal bleeding and scheduled for colonoscopy ? ?Pets: No pets ?Occupation: She did odd jobs.  Currently on disability ?Exposures: Reports mold at home.  No hot tub, Jacuzzi or down close or comforters ?Smoking history: Smokes marijuana every day and an occasional cigarette ?Travel history: No significant travel history ?Relevant family history: No significant family history of lung disease ? ?Interim history: ?Has not followed up since 2021. PFTs ordered at last visit but never got done. She continues to have recurrent lung pain  of unclear etiology ?Continues to smoke marijuana. ? ?She had a skin biopsy done by [facility] for cutaneous lesions and diagnosed with limited scleroderma, morphea.  She has been referred to rheumatology for evaluation but canceled or no showed for multiple appointments. ? ?Outpatient Encounter Medications as of 11/08/2021  ?Medication Sig  ? Ascorbic Acid (VITAMIN C) 100 MG tablet Take 100 mg by mouth daily. (Patient not taking: Reported on 11/08/2021)  ? Cholecalciferol (VITAMIN D3 PO) Take by mouth. (Patient not taking: Reported on 11/08/2021)  ? Cyanocobalamin (VITAMIN B-12 PO) Take by mouth. (Patient not taking: Reported on 11/08/2021)  ? [DISCONTINUED] hydrocortisone (ANUSOL-HC) 2.5 % rectal cream Place 1 application rectally 2 (two)  times daily. (Patient not taking: Reported on 11/08/2021)  ? ?No facility-administered encounter medications on file as of 11/08/2021.  ? ? ?Allergies as of 11/08/2021 - Review Complete 11/08/2021  ?Allergen Reaction Noted  ? Other Itching and Hives 04/30/2012  ? ? ?Past Medical History:  ?Diagnosis Date  ? Acute pain of right knee 01/04/2021  ? Acute pain of right shoulder 01/04/2021  ? Anemia   ? Anxiety   ? 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 Depression   ? bipolar  ? Gastroesophageal reflux disease 12/16/2009  ?    ? GERD (gastroesophageal reflux disease)   ? History of migraine headaches   ? headaches reported as migraines  ? Hypercholesterolemia   ? Hypotension   ? Obesity   ? Palpitations   ? negative cardiac w/u per Dr. Verl Blalock 2/09  ? Reflux esophagitis   ? Sleep apnea   ? No CPAP  ? Vaginal discharge 03/02/2020  ? ? ?Past Surgical History:  ?Procedure Laterality Date  ? COLONOSCOPY    ? ECTOPIC PREGNANCY SURGERY    ? EXPLORATORY LAPAROTOMY  2000  ? x2 (Dr. Kendall Flack)  ? HERPES SIMPLEX VIRUS DFA    ? TUBAL LIGATION  1987  ? ? ?Family History  ?Problem Relation Age of Onset  ? Hypertension Mother   ? Diabetes Mother   ? Heart disease Mother   ? Thyroid disease Mother   ? Heart attack Mother   ? Glaucoma Mother   ? Diabetes Father   ? Hypertension Sister   ? Diabetes Sister   ? Diabetes Sister   ? Hypertension Sister   ?  Hypertension Sister   ? Diabetes Sister   ? Hypertension Brother   ? Diabetes Brother   ? Mental illness Son   ? Mental illness Son   ? Pancreatic cancer Maternal Uncle   ? Heart attack Maternal Grandmother   ? Heart attack Maternal Grandfather   ? Pancreatic cancer Other   ?     family history  ? Colon cancer Neg Hx   ? ? ?Social History  ? ?Socioeconomic History  ? Marital status: Single  ?  Spouse name: Not on file  ? Number of children: 3  ? Years of education: Not on file  ? Highest education level: Not on file  ?Occupational History  ? Not on file  ?Tobacco Use  ? Smoking status:  Some Days  ?  Packs/day: 0.25  ?  Years: 36.00  ?  Pack years: 9.00  ?  Types: Cigarettes  ?  Last attempt to quit: 12/30/2019  ?  Years since quitting: 1.8  ? Smokeless tobacco: Never  ? Tobacco comments:  ?  2 cigs per week  ?Vaping Use  ? Vaping Use: Never used  ?Substance and Sexual Activity  ? Alcohol use: Yes  ?  Alcohol/week: 0.0 standard drinks  ?  Comment: Rare glass of wine  ? Drug use: Yes  ?  Types: Marijuana  ?  Comment: daily, smokes it  ? Sexual activity: Never  ?Other Topics Concern  ? Not on file  ?Social History Narrative  ? Current Social History 02/16/2021    ?   ? Patient lives alone in a [place] which is 2 stories. There are 13 steps with handrails up to the entrance the patient uses.   ?   ? Patient's method of transportation is city bus.  ?   ? The highest level of education was high school diploma.  ?   ? The patient currently disabled.  ?   ? Identified important Relationships are "My mother, my sisters, children, grandchildren (42)"   ?   ? Pets : None  ?    ? Interests / Fun: "Read, watch TV, movies, social media, talk on phone, shopping, travel"   ?   ? Current Stressors: "Simple people get on my nerves,;drama."   ?   ? Religious / Personal Beliefs: "Christian, [place], Pentecostal."   ?   ? Other: "I stay away from people."  ?   ? 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   ?    ?   ? ?Social Determinants of Health  ? ?Financial Resource Strain: Not on file  ?Food Insecurity: Food Insecurity Present  ? Worried About Charity fundraiser in the Last Year: Sometimes true  ? Ran Out of Food in the Last Year: Sometimes true  ?Transportation Needs: Unmet Transportation Needs  ? Lack of Transportation (Medical): No  ? Lack of Transportation (Non-Medical): Yes  ?Physical Activity: Not on file  ?Stress: Not on file  ?Social Connections: Not on file  ?Intimate Partner Violence: Not on file  ? ? ?Review of systems: ?Review of Systems  ?Constitutional: Negative for fever and chills.  ?HENT: Negative.   ?Eyes:  Negative for blurred vision.  ?Respiratory: as per HPI  ?Cardiovascular: Negative for chest pain and palpitations.  ?Gastrointestinal: Negative for vomiting, diarrhea, blood per rectum. ?Genitourinary: Negative for dysuria, urgency, frequency and hematuria.  ?Musculoskeletal: Negative for myalgias, back pain and joint pain.  ?Skin: Negative for itching and rash.  ?Neurological: Negative for dizziness,  tremors, focal weakness, seizures and loss of consciousness.  ?Endo/Heme/Allergies: Negative for environmental allergies.  ?Psychiatric/Behavioral: Negative for depression, suicidal ideas and hallucinations.  ?All other systems reviewed and are negative. ? ?Physical Exam: ?Blood pressure 126/68, pulse 90, temperature 98.4 ?F (36.9 ?C), temperature source Oral, height 5' 5.5" (1.664 m), weight 186 lb 12.8 oz (84.7 kg), last menstrual period 03/03/2016, SpO2 99 %. ?Gen:      No acute distress ?HEENT:  EOMI, sclera anicteric ?Neck:     No masses; no thyromegaly ?Lungs:    Clear to auscultation bilaterally; normal respiratory effort ?CV:         Regular rate and rhythm; no murmurs ?Abd:      + bowel sounds; soft, non-tender; no palpable masses, no distension ?Ext:    No edema; adequate peripheral perfusion ?Skin:      Warm and dry; no rash ?Neuro: alert and oriented x 3 ?Psych: normal mood and affect  ? ?Data Reviewed: ?Imaging: ?Chest x-ray 04/25/2019-no active disease.  ?Chest x-ray 01/14/2020-no acute cardiopulmonary disease ?I have reviewed the images personally ? ?PFTs: ? ?Labs: ?CBC 10/21/2018-WBC 4.3, eos 2.7%, absolute eosinophil count 159 ?CBC 04/07/2021-WBC 4.5, eos 4.2%, absolute eosinophil count 189 ? ?IgE 01/14/2020-36 ? ?Hypersensitivity panel 01/14/2020-negative ? ?Skin biopsy 06/13/2021 ?Sclerotic collagen with elongated fibroblasts.  Morphea. ? ?Assessment:  ?Chest discomfort ?Atypical presentation this is likely related to her ongoing marijuana smoking.  She has a new diagnosis of limited scleroderma, morphea and  is due to see rheumatology ?We will check D-dimer and high-res CT and PFTs for further evaluation of the lung ? ?OSA ?Intolerant of CPAP.  Will address at return visit ? ?Plan/Recommendations: ?D-dimer, hi

## 2021-11-08 NOTE — Patient Instructions (Addendum)
We will check a D-dimer today ?Schedule high-res CT and PFTs for evaluation of the lung ?Follow-up in 2 to 3 months.  Pathology skin biopsy 06/14/2019 2-6-skin biopsy ? ? ? ?

## 2021-11-09 LAB — PRO B NATRIURETIC PEPTIDE: NT-Pro BNP: 65 pg/mL (ref 0–287)

## 2021-11-13 ENCOUNTER — Encounter: Payer: Self-pay | Admitting: *Deleted

## 2021-11-13 ENCOUNTER — Telehealth: Payer: Self-pay | Admitting: Pulmonary Disease

## 2021-11-13 NOTE — Telephone Encounter (Signed)
Nicole Garfinkel, MD  ?11/11/2021 10:57 AM EDT   ?  ?Send letter that labs are normal  ? ? ?Patient is aware of results and voiced her understanding.  ?Nothing further needed.  ? ?

## 2021-11-16 ENCOUNTER — Ambulatory Visit (HOSPITAL_COMMUNITY)
Admission: RE | Admit: 2021-11-16 | Discharge: 2021-11-16 | Disposition: A | Discharge status: Home / Self Care | Payer: Medicare Other | Source: Ambulatory Visit | Attending: Pulmonary Disease | Admitting: Pulmonary Disease

## 2021-11-16 DIAGNOSIS — R0602 Shortness of breath: Secondary | ICD-10-CM | POA: Diagnosis not present

## 2021-11-16 DIAGNOSIS — J984 Other disorders of lung: Secondary | ICD-10-CM | POA: Diagnosis not present

## 2021-11-16 DIAGNOSIS — I2510 Atherosclerotic heart disease of native coronary artery without angina pectoris: Secondary | ICD-10-CM | POA: Diagnosis not present

## 2021-11-16 DIAGNOSIS — I700 Atherosclerosis of aorta: Secondary | ICD-10-CM | POA: Diagnosis not present

## 2021-11-22 ENCOUNTER — Ambulatory Visit: Payer: Medicare HMO | Admitting: Rheumatology

## 2021-11-27 ENCOUNTER — Telehealth: Payer: Self-pay | Admitting: Pulmonary Disease

## 2021-11-27 NOTE — Telephone Encounter (Signed)
Left message for patient to call back  

## 2021-11-29 NOTE — Telephone Encounter (Signed)
Spoke with pt and again reviewed CT Chest results as dictated by Dr. Vaughan Browner. Pt was encouraged to follow up with cardiologist if she had further questions about finding concerning heart. Pt stated understanding. Nothing further needed at this time.  ?

## 2021-11-29 NOTE — Telephone Encounter (Signed)
Patient is returning phone call. Patient phone number is [phone]. ?

## 2021-11-30 ENCOUNTER — Encounter: Payer: Self-pay | Admitting: Student

## 2021-11-30 ENCOUNTER — Ambulatory Visit (INDEPENDENT_AMBULATORY_CARE_PROVIDER_SITE_OTHER): Payer: Medicare Other | Admitting: Student

## 2021-11-30 VITALS — BP 136/82 | HR 113 | Ht 65.5 in | Wt 177.6 lb

## 2021-11-30 DIAGNOSIS — R0789 Other chest pain: Secondary | ICD-10-CM | POA: Diagnosis not present

## 2021-11-30 DIAGNOSIS — R002 Palpitations: Secondary | ICD-10-CM

## 2021-11-30 DIAGNOSIS — E785 Hyperlipidemia, unspecified: Secondary | ICD-10-CM | POA: Diagnosis not present

## 2021-11-30 DIAGNOSIS — Z720 Tobacco use: Secondary | ICD-10-CM | POA: Diagnosis not present

## 2021-11-30 DIAGNOSIS — R030 Elevated blood-pressure reading, without diagnosis of hypertension: Secondary | ICD-10-CM | POA: Diagnosis not present

## 2021-11-30 DIAGNOSIS — G4733 Obstructive sleep apnea (adult) (pediatric): Secondary | ICD-10-CM

## 2021-11-30 DIAGNOSIS — F1290 Cannabis use, unspecified, uncomplicated: Secondary | ICD-10-CM

## 2021-11-30 DIAGNOSIS — I2584 Coronary atherosclerosis due to calcified coronary lesion: Secondary | ICD-10-CM

## 2021-11-30 DIAGNOSIS — I2510 Atherosclerotic heart disease of native coronary artery without angina pectoris: Secondary | ICD-10-CM

## 2021-11-30 NOTE — Patient Instructions (Signed)
Medication Instructions:  ?Restart aspirin 81 mg ( Take 1 Tablet Daily). ?*If you need a refill on your cardiac medications before your next appointment, please call your pharmacy* ? ? ?Lab Work: ?CMET, Lipid Panel. To Be Done 1 month ?If you have labs (blood work) drawn today and your tests are completely normal, you will receive your results only by: ?MyChart Message (if you have MyChart) OR ?A paper copy in the mail ?If you have any lab test that is abnormal or we need to change your treatment, we will call you to review the results. ? ? ?Testing/Procedures: ?How to Prepare for Your Cardiac PET/CT Stress Test: ? ?1. Please do not take these medications before your test:  ? ?Medications that may interfere with the cardiac pharmacological stress agent (ex. nitrates or beta-blockers) the day of the exam. ?Theophylline containing medications for 12 hours. ?Dipyridamole 48 hours prior to the test. ?Your remaining medications may be taken with water. ? ?2. Nothing to eat or drink, except water, 3 hours prior to arrival time.   ?NO caffeine/decaffeinated products, or chocolate 12 hours prior to arrival. ? ?3. NO perfume, cologne or lotion ? ?4. Total time is 1 to 2 hours; you may want to bring reading material for the waiting time. ? ?5. Please report to Admitting at the [facility] En[facility] 60 minutes early for your test. ? [facility] ? [place], [place] [address] ? ?Diabetic Preparation:  ?Hold oral medications. ?You may take NPH and Lantus insulin. ?Do not take Humalog or Humulin R (Regular Insulin) the day of your test. ?Check blood sugars prior to leaving the house. ?If able to eat breakfast prior to 3 hour fasting, you may take all medications, including your insulin, ?Do not worry if you miss your breakfast dose of insulin - start at your next meal. ? ?IF YOU THINK YOU MAY BE PREGNANT, OR ARE NURSING PLEASE INFORM THE TECHNOLOGIST. ? ?In preparation for your appointment, medication and supplies  will be purchased.  Appointment availability is limited, so if you need to cancel or reschedule, please call the Radiology Department at [phone]  24 hours in advance to avoid a cancellation fee of $100.00 ? ?What to Expect After you Arrive: ? ?Once you arrive and check in for your appointment, you will be taken to a preparation room within the Radiology Department.  A technologist or Nurse will obtain your medical history, verify that you are correctly prepped for the exam, and explain the procedure.  Afterwards,  an IV will be started in your arm and electrodes will be placed on your skin for EKG monitoring during the stress portion of the exam. Then you will be escorted to the PET/CT scanner.  There, staff will get you positioned on the scanner and obtain a blood pressure and EKG.  During the exam, you will continue to be connected to the EKG and blood pressure machines.  A small, safe amount of a radioactive tracer will be injected in your IV to obtain a series of pictures of your heart along with an injection of a stress agent.   ? ?After your Exam: ? ?It is recommended that you eat a meal and drink a caffeinated beverage to counter act any effects of the stress agent.  Drink plenty of fluids for the remainder of the day and urinate frequently for the first couple of hours after the exam.  Your doctor will inform you of your test results within 7-10 business days. ? ?  For questions about your test or how to prepare for your test, please call: ?Marchia Bond, Cardiac Imaging Nurse Navigator  ?Gordy Clement, Cardiac Imaging Nurse Navigator ?Office: [phone]  ? ? ?Follow-Up: ?At [facility], you and your health needs are our priority.  As part of our continuing mission to provide you with exceptional heart care, we have created designated Provider Care Teams.  These Care Teams include your primary Cardiologist (physician) and Advanced Practice Providers (APPs -  Physician Assistants and Nurse  Practitioners) who all work together to provide you with the care you need, when you need it. ? ?We recommend signing up for the patient portal called "MyChart".  Sign up information is provided on this After Visit Summary.  MyChart is used to connect with patients for Virtual Visits (Telemedicine).  Patients are able to view lab/test results, encounter notes, upcoming appointments, etc.  Non-urgent messages can be sent to your provider as well.   ?To learn more about what you can do with MyChart, go to [email or web address].   ? ?Your next appointment:   ?3-4 month(s) ? ?The format for your next appointment:   ?In Person ? ?Provider:   ?Minus Breeding, MD   ? ? ?Other Instructions ?Monitor Blood Pressure Daily call office if Blood Pressure is consistently 130/80. Recommend Tylenol or Advil for muscle pain. Recommend discussing with Primary Care Provider. ? ?Important Information About Sugar ? ? ? ? ?  ?

## 2021-11-30 NOTE — Progress Notes (Signed)
?Cardiology Office Note:   ? ?Date:  11/30/2021  ? ?ID:  Nicole [name], DOB November 27, 1960, MRN [id number] ? ?PCP:  Nicole Pou, MD  ?Cardiologist:  Nicole Breeding, MD  ?Electrophysiologist:  None  ? ?Referring MD: Nicole Pou, MD  ? ?Chief Complaint: chest pain ? ?History of Present Illness:   ? ?Nicole [name] is a 61 y.o. female with a history of chest pain with negative ETT in 2017, palpitations, obstructive sleep apnea intolerant of CPAP, GERD, migraines, anxiety/depression, bipolar disorder,  and noncompliance who is followed by Nicole [name] and presents today for evaluation of chest pain. ? ?Patient was previously followed by Nicole [name] and now follows with Nicole [name]. Initial visit with Nicole [name] in 2017, patient reported intermittent left-sided chest pain that was reproducible with palpation.  It was felt to be very atypical but given significant family history, she ETT was ordered and was negative.  She has continued to complain of the same atypical pain since then.  She also has a history of palpitations.  Prior monitor was ordered but not completed.  Patient was last seen by Nicole [name] in 01/2021 at which time she reported 1 episode of tachypalpitations that she described as a rapid heart rate that finally went away with coughing.  She also continued to have some midsternal chest pain that happens sporadically but it was stable from prior pain.  Her palpitations did sound concerning for sustained tachyarrhythmia.  Repeat monitor was discussed but patient preferred to use the Kardia mobile device. ? ?Patient was recently seen by Nicole [name] in Pulmonology on 11/08/2021 after an not having been seen since 2021.  PFTs have been ordered at prior visit but she never got these done.  She continues to complain of her "recurrent lung pain" of unclear etiology.  December was felt to be very atypical and likely related to her ongoing marijuana use.  However D-dimer, BNP, high-resolution CT, and PFTs were  ordered for further evaluation of her lungs.  D-dimer and BNP were negative.  CT showed no evidence of fibrotic interstitial lung disease but did note mild, bland appearing bandlike scarring of the bilateral lung bases.  She was also noted to have scattered left coronary artery calcifications.  PFTs not been done yet.  Given coronary calcifications on CT, she was advised to follow-up with Cardiology. ? ?Patient presents today for follow-up. Here alone. Patient is very anxious about CT results. We discussed the results. Patient continues to have very atypical chest pain. She describes left sided chest pain that occurs at rest often when she lays down at night. She also has this pain if she smokes weed that is "too strong." Pain is reproducible on exam today. In fact, she swatted my hand away when I pressed on the exact spot where the pain is. This is consistent with musculoskeletal pain. She also has intermittent sharp shooting pain that only last for 1-2 seconds at a time and then resolves. She had some of these very brief sharp shooting pains during visit today. This also does not sound cardiac in nature. She denies any exertional chest pain. She has chronic dyspnea on exertion but states this is actually better than usual. No shortness of breath at rest. No orthopnea, PND, lower extremity edema. She has chronic palpitations but this is rare. She only has about 3-4 episodes per year. She also reports intermittent lightheadedness/dizziness but this is not new. No syncope. ? 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 She has been referred to Rheumatology for evaluation but has cancelled or no-showed multiple appointments; however, she states she is going to call them back and reschedule. This is one reason that she is very nervous about her CT scan because she was told scleroderma could cause hardening of her arteries.  ? ?Past Medical History:   ?Diagnosis Date  ? Acute pain of right knee 01/04/2021  ? Acute pain of right shoulder 01/04/2021  ? Anemia   ? Anxiety   ? 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 Depression   ? bipolar  ? Gastroesophageal reflux disease 12/16/2009  ?    ? GERD (gastroesophageal reflux disease)   ? History of migraine headaches   ? headaches reported as migraines  ? Hypercholesterolemia   ? Hypotension   ? Obesity   ? Palpitations   ? negative cardiac w/u per Nicole [name] 2/09  ? Reflux esophagitis   ? Sleep apnea   ? No CPAP  ? Vaginal discharge 03/02/2020  ? ? ?Past Surgical History:  ?Procedure Laterality Date  ? COLONOSCOPY    ? ECTOPIC PREGNANCY SURGERY    ? EXPLORATORY LAPAROTOMY  2000  ? x2 (Dr. Kendall [name])  ? HERPES SIMPLEX VIRUS DFA    ? TUBAL LIGATION  1987  ? ? ?Current Medications: ?No outpatient medications have been marked as taking for the 11/30/21 encounter (Office Visit) with Nicole Mclean, PA-C.  ?  ? ?Allergies:   Other  ? ?Social History  ? ?Socioeconomic History  ? Marital status: Single  ?  Spouse name: Not on file  ? Number of children: 3  ? Years of education: Not on file  ? Highest education level: Not on file  ?Occupational History  ? Not on file  ?Tobacco Use  ? Smoking status: Some Days  ?  Packs/day: 0.25  ?  Years: 36.00  ?  Pack years: 9.00  ?  Types: Cigarettes  ?  Last attempt to quit: 12/30/2019  ?  Years since quitting: 1.9  ? Smokeless tobacco: Never  ? Tobacco comments:  ?  2 cigs per week  ?Vaping Use  ? Vaping Use: Never used  ?Substance and Sexual Activity  ? Alcohol use: Yes  ?  Alcohol/week: 0.0 standard drinks  ?  Comment: Rare glass of wine  ? Drug use: Yes  ?  Types: Marijuana  ?  Comment: daily, smokes it  ? Sexual activity: Never  ?Other Topics Concern  ? Not on file  ?Social History Narrative  ? Current Social History 02/16/2021    ?   ? Patient lives alone in a [place] which is 2 stories. There are 13 steps with handrails up to the entrance the patient uses.   ?   ? Patient's  method of transportation is city bus.  ?   ? The highest level of education was high school diploma.  ?   ? The patient currently disabled.  ?   ? Identified important Relationships are "My mother, my sisters, children, grandchildren (48)"   ?   ? Pets : None  ?    ? Interests / Fun: "Read, watch TV, movies, social media, talk on phone, shopping, travel"   ?   ? Current Stressors: "Simple people get on my nerves,;drama."   ?   ? Religious / Personal Beliefs: "Christian, [place], Pentecostal."   ?   ? Other: "I stay away from people."  ?   ?  Hubbard Hartshorn, BSN, RN-BC   ?   ?    ?   ? ?Social Determinants of Health  ? ?Financial Resource Strain: Not on file  ?Food Insecurity: Food Insecurity Present  ? Worried About Charity fundraiser in the Last Year: Sometimes true  ? Ran Out of Food in the Last Year: Sometimes true  ?Transportation Needs: Unmet Transportation Needs  ? Lack of Transportation (Medical): No  ? Lack of Transportation (Non-Medical): Yes  ?Physical Activity: Not on file  ?Stress: Not on file  ?Social Connections: Not on file  ?  ? ?Family History: ?The patient's family history includes Diabetes in her brother, father, mother, sister, sister, and sister; Glaucoma in her mother; Heart attack in her maternal grandfather, maternal grandmother, and mother; Heart disease in her mother; Hypertension in her brother, mother, sister, sister, and sister; Mental illness in her son and son; Pancreatic cancer in her maternal uncle and another family member; Thyroid disease in her mother. There is no history of Colon cancer. ? ?ROS:   ?Please see the history of present illness.    ? ?EKGs/Labs/Other Studies Reviewed:   ? ?The following studies were reviewed today: ? ?Exercise Tolerance Test 09/16/2015: ?Blood pressure demonstrated a hypertensive response to exercise. ?Upsloping ST segment depression ST segment depression was noted during stress in the II, III, aVF, V4 and V5 leads, and returning to baseline after less  than 1 minute of recovery. ?No T wave inversion was noted during stress. ?Negative, adequate stress test. ? ?EKG:  EKG  ordered today. EKG personally reviewed and demonstrates sinus tachycardia, rate 113 b

## 2021-12-03 DIAGNOSIS — R002 Palpitations: Secondary | ICD-10-CM | POA: Diagnosis not present

## 2021-12-13 DIAGNOSIS — R3912 Poor urinary stream: Secondary | ICD-10-CM | POA: Diagnosis not present

## 2021-12-13 DIAGNOSIS — R3916 Straining to void: Secondary | ICD-10-CM | POA: Diagnosis not present

## 2021-12-13 DIAGNOSIS — R3121 Asymptomatic microscopic hematuria: Secondary | ICD-10-CM | POA: Diagnosis not present

## 2021-12-14 ENCOUNTER — Ambulatory Visit (INDEPENDENT_AMBULATORY_CARE_PROVIDER_SITE_OTHER): Payer: Medicare Other | Admitting: Pulmonary Disease

## 2021-12-14 ENCOUNTER — Encounter: Payer: Self-pay | Admitting: Internal Medicine

## 2021-12-14 ENCOUNTER — Ambulatory Visit (INDEPENDENT_AMBULATORY_CARE_PROVIDER_SITE_OTHER): Payer: Medicare Other | Admitting: Internal Medicine

## 2021-12-14 VITALS — BP 98/62 | HR 98 | Ht 65.0 in | Wt 177.1 lb

## 2021-12-14 DIAGNOSIS — K5909 Other constipation: Secondary | ICD-10-CM | POA: Diagnosis not present

## 2021-12-14 DIAGNOSIS — R1319 Other dysphagia: Secondary | ICD-10-CM | POA: Diagnosis not present

## 2021-12-14 DIAGNOSIS — R0602 Shortness of breath: Secondary | ICD-10-CM | POA: Diagnosis not present

## 2021-12-14 LAB — PULMONARY FUNCTION TEST
DL/VA % pred: 89 %
DL/VA: 3.71 ml/min/mmHg/L
DLCO cor % pred: 85 %
DLCO cor: 17.75 ml/min/mmHg
DLCO unc % pred: 85 %
DLCO unc: 17.75 ml/min/mmHg
FEF 25-75 Post: 2.71 L/s
FEF 25-75 Pre: 1.78 L/s
FEF2575-%Change-Post: 52 %
FEF2575-%Pred-Post: 131 %
FEF2575-%Pred-Pre: 86 %
FEV1-%Change-Post: 10 %
FEV1-%Pred-Post: 115 %
FEV1-%Pred-Pre: 104 %
FEV1-Post: 2.47 L
FEV1-Pre: 2.24 L
FEV1FVC-%Change-Post: 7 %
FEV1FVC-%Pred-Pre: 95 %
FEV6-%Change-Post: 3 %
FEV6-%Pred-Post: 116 %
FEV6-%Pred-Pre: 112 %
FEV6-Post: 3.06 L
FEV6-Pre: 2.96 L
FEV6FVC-%Change-Post: 0 %
FEV6FVC-%Pred-Post: 103 %
FEV6FVC-%Pred-Pre: 103 %
FVC-%Change-Post: 3 %
FVC-%Pred-Post: 112 %
FVC-%Pred-Pre: 109 %
FVC-Post: 3.06 L
FVC-Pre: 2.96 L
Post FEV1/FVC ratio: 81 %
Post FEV6/FVC ratio: 100 %
Pre FEV1/FVC ratio: 75 %
Pre FEV6/FVC Ratio: 100 %
RV % pred: 113 %
RV: 2.37 L
TLC % pred: 98 %
TLC: 5.13 L

## 2021-12-14 MED ORDER — POLYETHYLENE GLYCOL 3350 17 G PO PACK: 17.0000 g | PACK | Freq: Every day | ORAL | 0 refills | Status: DC

## 2021-12-14 NOTE — Patient Instructions (Addendum)
If you are age 63 or older, your body mass index should be between 23-30. Your Body mass index is 29.48 kg/m?Marland Kitchen If this is out of the aforementioned range listed, please consider follow up with your Primary Care Provider. ? ?If you are age 76 or younger, your body mass index should be between 19-25. Your Body mass index is 29.48 kg/m?Marland Kitchen If this is out of the aformentioned range listed, please consider follow up with your Primary Care Provider.  ? ?________________________________________________________ ? ?The [facility] providers would like to encourage you to use [facility] to communicate with providers for non-urgent requests or questions.  Due to long hold times on the telephone, sending your provider a message by [facility] may be a faster and more efficient way to get a response.  Please allow 48 business hours for a response.  Please remember that this is for non-urgent requests.  ?_______________________________________________________ ? ?You have been scheduled for an endoscopy. Please follow written instructions given to you at your visit today. ?If you use inhalers (even only as needed), please bring them with you on the day of your procedure. ? ?You will be contacted by [facility] in the next 2 days to arrange a Barium Swallow with tablet. .  The number on your caller ID will be [phone], please answer when they call.  If you have not heard from them in 2 days please call [phone] to schedule.   ? ? ?You have been scheduled for a Barium Esophogram at [facility] (1st floor of the hospital) on ________________ at __________________. Please arrive 15 minutes prior to your appointment for registration. Make certain not to have anything to eat or drink 3 hours prior to your test. If you need to reschedule for any reason, please contact radiology at [phone] to do so. ?__________________________________________________________________ ?A barium swallow is an examination that  concentrates on views of the esophagus. This tends to be a double contrast exam (barium and two liquids which, when combined, create a gas to distend the wall of the oesophagus) or single contrast (non-ionic iodine based). The study is usually tailored to your symptoms so a good history is essential. Attention is paid during the study to the form, structure and configuration of the esophagus, looking for functional disorders (such as aspiration, dysphagia, achalasia, motility and reflux) ?EXAMINATION ?You may be asked to change into a gown, depending on the type of swallow being performed. A radiologist and radiographer will perform the procedure. The radiologist will advise you of the ?type of contrast selected for your procedure and direct you during the exam. You will be asked to stand, sit or lie in several different positions and to hold a small amount of fluid in your mouth before being asked to swallow while the imaging is performed .In some instances you may be asked to swallow barium coated marshmallows to assess the motility of a solid food bolus. ?The exam can be recorded as a digital or video fluoroscopy procedure. ?POST PROCEDURE ?It will take 1-2 days for the barium to pass through your system. To facilitate this, it is important, unless otherwise directed, to increase your fluids for the next 24-48hrs and to resume your normal diet.  ?This test typically takes about 30 minutes to perform. ?__________________________________________________________________________________ ?We have sent the following medications to your pharmacy for you to pick up at your convenience: ?Miralax ? ? ?I appreciate the opportunity to care for you. ?Silvano Rusk, MD, [facility] ?

## 2021-12-14 NOTE — Progress Notes (Signed)
? ?Nicole [name] 61 y.o. 06-Oct-1960 [id number] ? ?Assessment & Plan:  ? ?Encounter Diagnoses  ?Name Primary?  ? Esophageal dysphagia Yes  ? Chronic constipation   ? ?Evaluate dysphagia with a barium swallow plus tablet and an EGD.  If she does have systemic scleroderma certainly could have a dysmotility, predisposition to reflux strictures etc. ? ?The risks and benefits as well as alternatives of endoscopic procedure(s) have been discussed and reviewed. All questions answered. The patient agrees to proceed. ? ?MiraLAX was prescribed to see if Medicaid would pay for it.  She may have to buy it OTC. ? ?CC: Nicole Pou, MD ? ? ? ?Subjective:  ? ?Chief Complaint: ? ?HPI ?61 year old African-American woman with a history of bipolar disorder, migraine headaches, GERD and adenomatous colon polyp here because of dysphagia problems in the setting of a diagnosis of morphea question scleroderma as well as persistent constipation. ? 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  She has a rheumatology appointment pending and the most recent dermatologist (Nicole [name]) additional testing.  She is describing a sensation of liquid and some solid dysphagia.  Also sometimes when she takes of breath "food shoots back there".  These are new problems.  She saw ENT and I do not think the solid dysphagia was described at that point.  I reviewed Dr. Iona [name] note.  His exam was negative.  He did not think further evaluation would be helpful.  She is also describing some heartburn symptomatology.  She is not on any therapy for that presently. ? ?She also has some chronic constipation issues that is helped by [facility] but she has been unable to afford the MiraLAX.  She is asking for a prescription to be sent in hopes that Medicaid will pay for it.  The patient is on disability and  Medicaid. ? ?02/07/2020 colonoscopy diminutive adenoma otherwise normal.  No prior EGD. ? ? ? ?Allergies  ?Allergen Reactions  ? Other Itching and Hives  ?  Pain medication ordered at [facility] in 2009 - Patient does not know name of medication  ?Pain medication given at [facility] approximately 2009 ?Pain medication ordered at [facility] in 2009 - Patient does not know name of medication  ?Pain medication given at [facility] approximately 2009  ? ?Current Meds  ?Medication Sig  ? aspirin 81 MG chewable tablet Chew 81 mg by mouth. 3 times a week  ?    ? ?Past Medical History:  ?Diagnosis Date  ? Acute pain of right knee 01/04/2021  ? Acute pain of right shoulder 01/04/2021  ? Anemia   ? Anxiety   ? 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 Bipolar disorder ([place]) 04/14/2007  ? Depression   ? bipolar  ? Gastroesophageal reflux disease 12/16/2009  ?    ? GERD (gastroesophageal reflux disease)   ? History of migraine headaches   ? headaches reported as migraines  ? Hypercholesterolemia   ? Hypotension   ? Morphea 2022  ? Obesity   ? Palpitations   ? negative cardiac w/u per Nicole [name] 2/09  ? Reflux esophagitis   ? Sleep apnea   ? No CPAP  ? Vaginal discharge 03/02/2020  ? ?Past Surgical History:  ?Procedure Laterality Date  ? COLONOSCOPY    ? ECTOPIC PREGNANCY SURGERY    ? EXPLORATORY LAPAROTOMY  2000  ?  x2 (Dr. Kendall Flack)  ? HERPES SIMPLEX VIRUS DFA    ? TUBAL LIGATION  1987  ? ?Social History  ? ?Social History Narrative  ? Current Social History 02/16/2021    ?   ? Patient lives alone in a [place] which is 2 stories. There are 13 steps with handrails up to the entrance the patient uses.   ?   ? Patient's method of transportation is city bus.  ?   ? The highest level of education was high school diploma.  ?   ? The patient currently disabled.  ?   ? Identified important Relationships are "My mother, my sisters, children, grandchildren (34)"   ?   ? Pets : None  ?    ? Interests / Fun: "Read,  watch TV, movies, social media, talk on phone, shopping, travel"   ?   ? Current Stressors: "Simple people get on my nerves,;drama."   ?   ? Religious / Personal Beliefs: "Christian, [place], Pentecostal."   ?   ? Other: "I stay away from people."  ?   ? Nicole [name], BSN, RN-BC   ?   ?    ?   ? ?family history includes Diabetes in her brother, father, mother, sister, sister, and sister; Glaucoma in her mother; Heart attack in her maternal grandfather, maternal grandmother, and mother; Heart disease in her mother; Hypertension in her brother, mother, sister, sister, and sister; Mental illness in her son and son; Pancreatic cancer in her maternal uncle and another family member; Thyroid disease in her mother. ? ? ?Review of Systems ?As above ? ?Objective:  ? Physical Exam ?BP 98/62   Pulse 98   Ht '5\' 5"'$  (1.651 m)   Wt 177 lb 2 oz (80.3 kg)   LMP 03/03/2016 (Approximate)   BMI 29.48 kg/m?  ?NAD middle-aged bw  ?Mouth good dentition ?Neck supple no TM ?Lungs cta ?Cor NL S1S2 no rmg ?Abd soft and NT BS + ?Alert and oriented x 3 ? ?

## 2021-12-14 NOTE — Progress Notes (Signed)
PFT done today. 

## 2021-12-15 ENCOUNTER — Emergency Department (HOSPITAL_COMMUNITY)
Admission: EM | Admit: 2021-12-15 | Discharge: 2021-12-15 | Disposition: A | Discharge status: Home / Self Care | Payer: Medicare Other | Attending: Emergency Medicine | Admitting: Emergency Medicine

## 2021-12-15 ENCOUNTER — Other Ambulatory Visit: Payer: Self-pay

## 2021-12-15 ENCOUNTER — Encounter (HOSPITAL_COMMUNITY): Payer: Self-pay

## 2021-12-15 DIAGNOSIS — N644 Mastodynia: Secondary | ICD-10-CM | POA: Diagnosis not present

## 2021-12-15 DIAGNOSIS — Z7982 Long term (current) use of aspirin: Secondary | ICD-10-CM | POA: Insufficient documentation

## 2021-12-15 NOTE — Discharge Instructions (Addendum)
This is not something he should be worried about on an emergent basis.  You may call your breast physician who can further evaluate you if you continue to be concerned.  Your primary care physician is also a good place to start. ?

## 2021-12-15 NOTE — ED Triage Notes (Signed)
Patient reports that she has an enlarged vein on the chest near the left breast. "Patient states "I have never felt this before." ?

## 2021-12-15 NOTE — ED Provider Notes (Signed)
?[facility] DEPT ?Provider Note ? ? ?CSN: [id number] ?Arrival date & time: 12/15/21  1325 ? ?  ? ?History ?No chief complaint on file. ? ? ?Nicole [name] is a 61 y.o. female with a past medical history of morphea presenting with a concern of an artery or vein protruding from her left breast.  Says that this started this morning.  Gets regular mammograms, last one earlier this year.  She says they have all been normal.  No nipple discharge or warmth however says that the vertical area of inflammation is tender to palpation.  No personal or family history of breast cancer. ? ?HPI ? ?  ? ?Home Medications ?Prior to Admission medications   ?Medication Sig Start Date End Date Taking? Authorizing Provider  ?Ascorbic Acid (VITAMIN C) 100 MG tablet Take 100 mg by mouth daily. ?Patient not taking: Reported on 11/08/2021    [provider]  ?aspirin 81 MG chewable tablet Chew 81 mg by mouth. 3 times a week    [provider]  ?Cholecalciferol (VITAMIN D3 PO) Take by mouth. ?Patient not taking: Reported on 11/08/2021    [provider]  ?polyethylene glycol (MIRALAX) 17 g packet Take 17 g by mouth daily. 12/14/21   Gatha Mayer, MD  ?   ? ?Allergies    ?Other   ? ?Review of Systems   ?Review of Systems ? ?Physical Exam ?Updated Vital Signs ?BP 114/79   Pulse 74   Temp 99.6 ?F (37.6 ?C) (Oral)   Resp 14   Ht '5\' 5"'$  (1.651 m)   Wt 80.3 kg   LMP 03/03/2016 (Approximate)   SpO2 95%   BMI 29.48 kg/m?  ?Physical Exam ?Vitals and nursing note reviewed.  ?Constitutional:   ?   Appearance: Normal appearance.  ?HENT:  ?   Head: Normocephalic and atraumatic.  ?Eyes:  ?   General: No scleral icterus. ?   Conjunctiva/sclera: Conjunctivae normal.  ?Pulmonary:  ?   Effort: Pulmonary effort is normal. No respiratory distress.  ?Skin: ?   General: Skin is warm and dry.  ?   Findings: No rash.  ?   Comments: Breast exam performed in presence of RN.  Patient with no palpable nodules or  masses.  No nipple discharge.  No varicose veins or palpable arteries  ?Neurological:  ?   Mental Status: She is alert.  ?Psychiatric:     ?   Mood and Affect: Mood normal.  ? ? ?ED Results / Procedures / Treatments   ?Labs ?(all labs ordered are listed, but only abnormal results are displayed) ?Labs Reviewed - No data to display ? ?EKG ?None ? ?Radiology ?No results found. ? ?Procedures ?Procedures  ? ?Medications Ordered in ED ?Medications - No data to display ? ?ED Course/ Medical Decision Making/ A&P ?  ?                        ?Medical Decision Making ? ?61 year old female with a past medical history of morphea presenting today with left breast tenderness.  She says that there is a "vein or artery" that is inflamed.  Noticed it this morning.  No history of abnormal mammograms. ? ?Physical exam: I am unable to palpate the area that the patient is speaking of however she does have some tenderness over the left breast just above the sternum. ? ?MDM/disposition: Imaging will likely not be of benefit at this time.  Appropriate imaging would include  a mammogram which we are unable to perform.  We discussed that it is more appropriate to follow-up with her breast surgeons who do her mammograms about this issue.  She is agreeable to this plan.  She was reassured that there is not any palpable mass in her chest.  Ambulated out of the department in stable condition. ? ?Final Clinical Impression(s) / ED Diagnoses ?Final diagnoses:  ?Breast tenderness in female  ? ? ?Rx / DC Orders ?Results and diagnoses were explained to the patient. Return precautions discussed in full. Patient had no additional questions and expressed complete understanding. ? ? ?This chart was dictated using voice recognition software.  Despite best efforts to proofread,  errors can occur which can change the documentation meaning.  ?  ?Rhae Hammock, PA-C ?12/15/21 1402 ? ?  ?Malvin Johns, MD ?12/15/21 1423 ? ?

## 2021-12-21 ENCOUNTER — Encounter: Payer: Self-pay | Admitting: Pulmonary Disease

## 2021-12-21 ENCOUNTER — Ambulatory Visit (INDEPENDENT_AMBULATORY_CARE_PROVIDER_SITE_OTHER): Payer: Medicare Other | Admitting: Pulmonary Disease

## 2021-12-21 VITALS — BP 112/62 | HR 80 | Temp 98.3°F | Ht 60.0 in | Wt 176.8 lb

## 2021-12-21 DIAGNOSIS — G4733 Obstructive sleep apnea (adult) (pediatric): Secondary | ICD-10-CM | POA: Diagnosis not present

## 2021-12-21 DIAGNOSIS — L940 Localized scleroderma [morphea]: Secondary | ICD-10-CM

## 2021-12-21 DIAGNOSIS — R079 Chest pain, unspecified: Secondary | ICD-10-CM | POA: Diagnosis not present

## 2021-12-21 NOTE — Progress Notes (Signed)
? ?      ?Nicole [name]    [id number]    June 28, 1961 ? ?Primary Care Physician:Williams, Elaina Pattee, MD ? ?Referring Physician: [name], MD ?[place] ?[place],  [place] [address] ? ?Chief complaint: Follow-up for lung pain ? ?HPI: ?61 year old with history of GERD, depression, sleep apnea ?Complains of throat irritation, chest tightness.  This usually occurs after she smokes marijuana.  No cough, sputum production, fevers, chills ?Evaluated by Dr. Redmond Baseman, ENT with no abnormalities noted.  She was advised to quit smoking. ?Not on inhalers at present.  She has a diagnosis of sleep apnea but intolerant of CPAP. ?Being evaluated for rectal bleeding and scheduled for colonoscopy. ? ?She had a skin biopsy done by [facility] for cutaneous lesions and diagnosed with limited scleroderma, morphea.  She has been referred to rheumatology for evaluation but canceled or no showed for multiple appointments. ? ?Pets: No pets ?Occupation: She did odd jobs.  Currently on disability ?Exposures: Reports mold at home.  No hot tub, Jacuzzi or down close or comforters ?Smoking history: Smokes marijuana every day and an occasional cigarette ?Travel history: No significant travel history ?Relevant family history: No significant family history of lung disease ? ?Interim history: ?She is here for review of CT and PFTs which were done for evaluation of the lung given recent diagnosis of limited scleroderma and morphea ?States that breathing is doing well with no issues ? ?Outpatient Encounter Medications as of 12/21/2021  ?Medication Sig  ? aspirin 81 MG chewable tablet Chew 81 mg by mouth. 3 times a week  ? Ascorbic Acid (VITAMIN C) 100 MG tablet Take 100 mg by mouth daily. (Patient not taking: Reported on 11/08/2021)  ? Cholecalciferol (VITAMIN D3 PO) Take by mouth. (Patient not taking: Reported on 11/08/2021)  ? polyethylene glycol (MIRALAX) 17 g packet Take 17 g by mouth daily. (Patient not taking: Reported on  12/21/2021)  ? ?No facility-administered encounter medications on file as of 12/21/2021.  ? ? ?Physical Exam: ?Blood pressure 112/62, pulse 80, temperature 98.3 ?F (36.8 ?C), temperature source Oral, height 5' (1.524 m), weight 176 lb 12.8 oz (80.2 kg), last menstrual period 03/03/2016, SpO2 98 %. ?Gen:      No acute distress ?HEENT:  EOMI, sclera anicteric ?Neck:     No masses; no thyromegaly ?Lungs:    Clear to auscultation bilaterally; normal respiratory effort ?CV:         Regular rate and rhythm; no murmurs ?Abd:      + bowel sounds; soft, non-tender; no palpable masses, no distension ?Ext:    No edema; adequate peripheral perfusion ?Skin:      Warm and dry; no rash ?Neuro: alert and oriented x 3 ?Psych: normal mood and affect  ? ?Data Reviewed: ?Imaging: ?Chest x-ray 04/25/2019-no active disease.  ?Chest x-ray 01/14/2020-no acute cardiopulmonary disease ?High-resolution CT 11/16/2021-no evidence of interstitial lung disease, bland minimal scarring bilaterally.  Coronary artery disease. ?I have reviewed the images personally ? ?PFTs: ?12/14/2021 ?FVC 3.06 [112%], FEV1 2.47 [115%], F/F 81, TLC 5.13 [98%], DLCO 17.75 [85%] ?Normal test ? ?Labs: ?CBC 10/21/2018-WBC 4.3, eos 2.7%, absolute eosinophil count 159 ?CBC 04/07/2021-WBC 4.5, eos 4.2%, absolute eosinophil count 189 ? ?IgE 01/14/2020-36 ? ?Hypersensitivity panel 01/14/2020-negative ? ?Skin biopsy 06/13/2021 ?Sclerotic collagen with elongated fibroblasts.  Morphea. ? ?Assessment:  ?Chest discomfort, evaluation for interstitial lung disease ?Atypical presentation this is likely related to her ongoing marijuana smoking.  She has a new diagnosis of limited  scleroderma, morphea and is due to see rheumatology ?D-dimer is negative ruling out venous thromboembolism ? ?There is no evidence of interstitial lung disease on CT or PFTs.  Continue monitoring ? ?Coronary atherosclerosis ?Noted on high-res CT.  She has followed up with cardiology and has a stress  test. ? ?OSA ?Intolerant of CPAP.  Will address at return visit ? ?Plan/Recommendations: ?Return in 6 months ? ?Marshell Garfinkel MD ?[facility] ?12/21/2021, 9:51 AM ? ?CC: [name], MD ? ? ?

## 2021-12-21 NOTE — Patient Instructions (Signed)
I have reviewed your CT and PFTs which did not show any lung issues ?It is good that you have a cardiology evaluation pending for plaque buildup in the blood vessels of the heart ?I will follow back with you in 6 months. ?

## 2021-12-30 NOTE — Progress Notes (Addendum)
? ?Office Visit Note ? ?Patient: Nicole [name]             ?Date of Birth: 08-13-1961           ?MRN: [id number]             ?PCP: [name], MD ?Referring: [name], MD ?Visit Date: 01/01/2022 ? ? ?Subjective:  ? ?History of Present Illness: Nicole [name] is a 61 y.o. female here for morphea evaluation for possible systemic sclerosis. She has been diagnosed by dermatology biopsy of affected area on her leg. She has skin rashes in multiple areas she estimates for about 20 years in total not sure about when these have progressed to become more noticeable.  Is associated with loss of hair as well worst in bilateral temporal areas and in her distal legs.  She has a few hyperpigmented skin patches and some areas of thickening or indentations.  Some hypopigmented areas as well but smaller spots and this is also been a scattered distribution including her torso.  She has noticed tightness or catching and tendons on the top of her feet that comes and goes feels like this takes several minutes until release and get back to normal range of movement.  She notices some swelling intermittently in her hands worse when walking and standing for prolonged time.  She saw pulmonology for assessment with no significant CT abnormalities. GI workup is planned for evaluating esophageal dysmotility. Concern is also for possible esophageal stricture or scarring related to chronic GERD. ? ? ?Activities of Daily Living:  ?Patient reports morning stiffness for 0 minutes.   ?Patient Denies nocturnal pain.  ?Difficulty dressing/grooming: Denies ?Difficulty climbing stairs: Denies ?Difficulty getting out of chair: Denies ?Difficulty using hands for taps, buttons, cutlery, and/or writing: Denies ? ?Review of Systems  ?Constitutional:  Positive for fatigue.  ?HENT:  Positive for mouth dryness. Negative for mouth sores and nose dryness.   ?Eyes:  Positive for pain. Negative for itching and dryness.  ?Respiratory:  Negative for  shortness of breath and difficulty breathing.   ?Cardiovascular:  Positive for palpitations. Negative for chest pain.  ?Gastrointestinal:  Positive for constipation. Negative for blood in stool and diarrhea.  ?Endocrine: Negative for increased urination.  ?Genitourinary:  Positive for difficulty urinating.  ?Musculoskeletal:  Positive for joint pain, joint pain and joint swelling. Negative for myalgias, morning stiffness, muscle tenderness and myalgias.  ?Skin:  Positive for rash. Negative for color change and redness.  ?Allergic/Immunologic: Negative for susceptible to infections.  ?Neurological:  Positive for headaches and weakness. Negative for dizziness, numbness and memory loss.  ?Hematological:  Positive for bruising/bleeding tendency.  ?Psychiatric/Behavioral:  Negative for confusion.   ? ?PMFS History:  ?Patient Active Problem List  ? Diagnosis Date Noted  ? Sleep difficulties 09/01/2021  ? 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  ? Morphea 07/03/2021  ? Inclusion cyst 05/10/2021  ? Other personal history of psychological trauma, not elsewhere classified 03/16/2021  ? Mass of soft tissue of abdomen 01/05/2021  ? Nodule of skin of breast 01/04/2021  ? History of hyperlipidemia 11/10/2019  ? Overweight 09/23/2019  ? Rash and other nonspecific skin eruption 05/07/2018  ? History of recurrent UTI (urinary tract infection) 11/12/2017  ? Marijuana use 11/12/2017  ? OSA (obstructive sleep apnea) 12/27/2015  ? Bipolar disorder ([place]) 04/14/2007  ?  ?Past Medical History:  ?Diagnosis Date  ? Acute pain of right knee 01/04/2021  ? Acute pain of right shoulder 01/04/2021  ? Anemia   ?  Anxiety   ? 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 Bipolar disorder ([place]) 04/14/2007  ? Depression   ? bipolar  ? Gastroesophageal reflux disease 12/16/2009  ?    ? GERD (gastroesophageal reflux disease)   ? History of migraine headaches   ? headaches reported as migraines  ? Hypercholesterolemia   ? Hypotension   ? Morphea 2022  ? Obesity   ?  Palpitations   ? negative cardiac w/u per Dr. Verl Blalock 2/09  ? Reflux esophagitis   ? Sleep apnea   ? No CPAP  ? Vaginal discharge 03/02/2020  ?  ?Family History  ?Problem Relation Age of Onset  ? Hypertension Mother   ? Diabetes Mother   ? Heart disease Mother   ? Thyroid disease Mother   ? Glaucoma Mother   ? Diabetes Father   ? Hypertension Sister   ? Diabetes Sister   ? Diabetes Sister   ? Hypertension Sister   ? Hypertension Sister   ? Diabetes Sister   ? Hypertension Brother   ? Diabetes Brother   ? Pancreatic cancer Maternal Uncle   ? Heart attack Maternal Grandmother   ? Heart attack Maternal Grandfather   ? Mental illness Son   ? Mental illness Son   ? Pancreatic cancer Other   ?     family history  ? Colon cancer Neg Hx   ? ?Past Surgical History:  ?Procedure Laterality Date  ? COLONOSCOPY    ? ECTOPIC PREGNANCY SURGERY    ? EXPLORATORY LAPAROTOMY  2000  ? x2 (Dr. Kendall Flack)  ? HERPES SIMPLEX VIRUS DFA    ? TUBAL LIGATION  1987  ? ?Social History  ? ?Social History Narrative  ? Current Social History 02/16/2021    ?   ? Patient lives alone in a [place] which is 2 stories. There are 13 steps with handrails up to the entrance the patient uses.   ?   ? Patient's method of transportation is city bus.  ?   ? The highest level of education was high school diploma.  ?   ? The patient currently disabled.  ?   ? Identified important Relationships are "My mother, my sisters, children, grandchildren (46)"   ?   ? Pets : None  ?    ? Interests / Fun: "Read, watch TV, movies, social media, talk on phone, shopping, travel"   ?   ? Current Stressors: "Simple people get on my nerves,;drama."   ?   ? Religious / Personal Beliefs: "Christian, [place], Pentecostal."   ?   ? Other: "I stay away from people."  ?   ? 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   ?    ?   ? ?Immunization History  ?Administered Date(s) Administered  ? Td 10/18/2005  ? Tdap 10/27/2012  ?  ? ?Objective: ?Vital Signs: BP 128/79 (BP Location: Right Arm, Patient  Position: Sitting, Cuff Size: Normal)   Pulse 72   Ht '5\' 5"'$  (1.651 m)   Wt 179 lb 9.6 oz (81.5 kg)   LMP 03/03/2016 (Approximate)   BMI 29.89 kg/m?   ? ?Physical Exam ?Cardiovascular:  ?   Rate and Rhythm: Normal rate and regular rhythm.  ?Pulmonary:  ?   Effort: Pulmonary effort is normal.  ?   Breath sounds: Normal breath sounds.  ?Skin: ?   General: Skin is warm and dry.  ?   Comments: No digital pitting or nail changes ?Normal nailfold capillaroscopy appearance ?  Several rounds 5 to 20 mm diameter hyperpigmented skin patches on anterior shin no appreciable induration ?Few palpable subcutaneous nodules in abdominal wall without overlying skin discoloration  ?Neurological:  ?   Mental Status: She is alert.  ?Psychiatric:     ?   Mood and Affect: Mood normal.  ?  ? ?Musculoskeletal Exam:  ?Shoulders full ROM no tenderness or swelling ?Elbows full ROM no tenderness or swelling ?Wrists full ROM no tenderness or swelling ?Fingers full ROM no tenderness or swelling ?Knees full ROM no tenderness or swelling ?Ankles full ROM no tenderness or swelling ?MTPs full ROM no tenderness or swelling ? ? ?Investigation: ?No additional findings. ? ?Imaging: ?No results found. ? ?Recent Labs: ?Lab Results  ?Component Value Date  ? WBC 4.5 04/07/2021  ? HGB 12.6 04/07/2021  ? PLT 222.0 04/07/2021  ? NA 139 04/07/2021  ? K 3.9 04/07/2021  ? CL 104 04/07/2021  ? CO2 25 04/07/2021  ? GLUCOSE 78 04/07/2021  ? BUN 13 04/07/2021  ? CREATININE 0.86 04/07/2021  ? BILITOT 0.6 04/07/2021  ? ALKPHOS 65 04/07/2021  ? AST 15 04/07/2021  ? ALT 16 04/07/2021  ? PROT 7.3 04/07/2021  ? ALBUMIN 4.6 04/07/2021  ? CALCIUM 9.4 04/07/2021  ? GFRAA 85 09/25/2019  ? QFTBGOLD Negative 03/14/2017  ? ? ?Speciality Comments: No specialty comments available. ? ?Procedures:  ?No procedures performed ?Allergies: Other  ? ?Assessment / Plan:     ?Visit Diagnoses: Morphea - Plan: ANA, Anti-scleroderma antibody, RNP Antibody, Centromere Antibodies, RNA Polymerase  III Aby ? ?Morphea based on previous biopsy has seen to dermatology offices for this most recently [place] office.  She is not currently on any particular treatment.  By my exam I see no evidence for systemic

## 2022-01-01 ENCOUNTER — Encounter: Payer: Self-pay | Admitting: Internal Medicine

## 2022-01-01 ENCOUNTER — Ambulatory Visit (INDEPENDENT_AMBULATORY_CARE_PROVIDER_SITE_OTHER): Payer: Medicare Other | Admitting: Internal Medicine

## 2022-01-01 VITALS — BP 128/79 | HR 72 | Ht 65.0 in | Wt 179.6 lb

## 2022-01-01 DIAGNOSIS — R21 Rash and other nonspecific skin eruption: Secondary | ICD-10-CM | POA: Diagnosis not present

## 2022-01-01 DIAGNOSIS — L940 Localized scleroderma [morphea]: Secondary | ICD-10-CM | POA: Diagnosis not present

## 2022-01-01 DIAGNOSIS — Z87440 Personal history of urinary (tract) infections: Secondary | ICD-10-CM

## 2022-01-01 NOTE — Addendum Note (Signed)
Addended by: Collier Salina on: 01/01/2022 10:05 AM ? ? Modules accepted: Orders ? ?

## 2022-01-02 DIAGNOSIS — H25813 Combined forms of age-related cataract, bilateral: Secondary | ICD-10-CM | POA: Diagnosis not present

## 2022-01-02 DIAGNOSIS — H40013 Open angle with borderline findings, low risk, bilateral: Secondary | ICD-10-CM | POA: Diagnosis not present

## 2022-01-02 DIAGNOSIS — Z83511 Family history of glaucoma: Secondary | ICD-10-CM | POA: Diagnosis not present

## 2022-01-02 LAB — URINALYSIS
Bilirubin Urine: NEGATIVE
Glucose, UA: NEGATIVE
Hgb urine dipstick: NEGATIVE
Ketones, ur: NEGATIVE
Leukocytes,Ua: NEGATIVE
Nitrite: NEGATIVE
Protein, ur: NEGATIVE
Specific Gravity, Urine: 1.018 (ref 1.001–1.035)
pH: 6 (ref 5.0–8.0)

## 2022-01-03 DIAGNOSIS — R319 Hematuria, unspecified: Secondary | ICD-10-CM | POA: Diagnosis not present

## 2022-01-03 DIAGNOSIS — R3121 Asymptomatic microscopic hematuria: Secondary | ICD-10-CM | POA: Diagnosis not present

## 2022-01-04 ENCOUNTER — Telehealth: Payer: Self-pay | Admitting: Internal Medicine

## 2022-01-04 NOTE — Telephone Encounter (Signed)
Patient called the office stating she had a new patient appointment on 5/15 and Dr. Benjamine Mola was supposed to reach out with the results of her labs. Patient states she has not heard anything and requests a call with the results of her labs.

## 2022-01-04 NOTE — Telephone Encounter (Signed)
I called patient 

## 2022-01-04 NOTE — Telephone Encounter (Signed)
One lab result is still pending I was planning to wait for this. So far the results have been negative.

## 2022-01-05 ENCOUNTER — Inpatient Hospital Stay (HOSPITAL_COMMUNITY): Admission: RE | Admit: 2022-01-05 | Payer: Medicare Other | Source: Ambulatory Visit

## 2022-01-08 ENCOUNTER — Telehealth: Payer: Self-pay | Admitting: Internal Medicine

## 2022-01-08 NOTE — Telephone Encounter (Signed)
Patient called the office requesting a call back with her final lab result. Patient states she would like someone to go over her other results with her.

## 2022-01-08 NOTE — Telephone Encounter (Signed)
I called patient, labs still pending, we will call patient when all labs have resulted.

## 2022-01-12 ENCOUNTER — Telehealth (HOSPITAL_COMMUNITY): Payer: Self-pay | Admitting: Emergency Medicine

## 2022-01-12 ENCOUNTER — Telehealth: Payer: Self-pay | Admitting: Cardiology

## 2022-01-12 NOTE — Telephone Encounter (Signed)
Unable to leave vm Skila Rollins RN Navigator Cardiac [facility] [phone] Office  [phone] Cell  

## 2022-01-12 NOTE — Telephone Encounter (Signed)
[employer] called stating patient did not need a prior auth for her PET scan.  She has an open access plan and prior Josem Kaufmann is not needed.  They said if you have an question you can call the provider line at [phone].

## 2022-01-16 ENCOUNTER — Inpatient Hospital Stay (HOSPITAL_COMMUNITY): Payer: Medicare Other

## 2022-01-17 ENCOUNTER — Other Ambulatory Visit: Payer: Self-pay | Admitting: *Deleted

## 2022-01-17 ENCOUNTER — Telehealth: Payer: Self-pay | Admitting: Internal Medicine

## 2022-01-17 ENCOUNTER — Encounter: Payer: Medicare Other | Admitting: Internal Medicine

## 2022-01-17 DIAGNOSIS — L940 Localized scleroderma [morphea]: Secondary | ICD-10-CM

## 2022-01-17 LAB — CENTROMERE ANTIBODIES: Centromere Ab Screen: <1 AI

## 2022-01-17 LAB — RNP ANTIBODY: Ribonucleic Protein(ENA) Antibody, IgG: <1 AI

## 2022-01-17 LAB — ANA: Anti Nuclear Antibody (ANA): NEGATIVE

## 2022-01-17 LAB — RNA POLYMERASE III ABY: RNA Polymerase III Aby: <20 Units (ref <20)

## 2022-01-17 LAB — ANTI-SCLERODERMA ANTIBODY: Scleroderma (Scl-70) (ENA) Antibody, IgG: <1 AI

## 2022-01-17 NOTE — Telephone Encounter (Signed)
Patient called the office requesting a call back with her final lab result and to go over the rest. Patient states she has questions.

## 2022-01-17 NOTE — Telephone Encounter (Signed)
Attempted to return call left VM x1 for her to call back or we can reach out again. Her results are all negative which is great. I do not think she has systemic disease just morphea which is like scleroderma but limited to areas of skin. She can follow up with her dermatologist about treatment for this, or if they think she needs to see a scleroderma expert could refer to Dr. Sharol Roussel.

## 2022-01-17 NOTE — Telephone Encounter (Signed)
I called patient, referral placed for Dr. Sharol Roussel per patient's request.

## 2022-01-18 ENCOUNTER — Ambulatory Visit (INDEPENDENT_AMBULATORY_CARE_PROVIDER_SITE_OTHER): Payer: Medicare Other | Admitting: Internal Medicine

## 2022-01-18 DIAGNOSIS — L940 Localized scleroderma [morphea]: Secondary | ICD-10-CM | POA: Diagnosis not present

## 2022-01-18 DIAGNOSIS — Z8639 Personal history of other endocrine, nutritional and metabolic disease: Secondary | ICD-10-CM | POA: Diagnosis not present

## 2022-01-18 DIAGNOSIS — R1310 Dysphagia, unspecified: Secondary | ICD-10-CM | POA: Insufficient documentation

## 2022-01-18 DIAGNOSIS — R319 Hematuria, unspecified: Secondary | ICD-10-CM

## 2022-01-18 DIAGNOSIS — G4733 Obstructive sleep apnea (adult) (pediatric): Secondary | ICD-10-CM

## 2022-01-18 DIAGNOSIS — N63 Unspecified lump in unspecified breast: Secondary | ICD-10-CM

## 2022-01-18 DIAGNOSIS — F3131 Bipolar disorder, current episode depressed, mild: Secondary | ICD-10-CM | POA: Insufficient documentation

## 2022-01-18 DIAGNOSIS — I2510 Atherosclerotic heart disease of native coronary artery without angina pectoris: Secondary | ICD-10-CM | POA: Diagnosis not present

## 2022-01-18 HISTORY — DX: Dysphagia, unspecified: R13.10

## 2022-01-18 NOTE — Assessment & Plan Note (Signed)
Depressed mood has improved dramatically upon clarity regarding her many health conditions.  She is noticing increased energy level, is participating in life, is finding enjoyment in family activities (high school graduations) and has benefited from talk therapy with clinical psychologist Dr. Carolynne Edouard.  She wishes to transfer her care to Dr. Aloha Gell new location.  She wants to avoid mood medications "because they cause diabetes, and I do not want that.  Doing fine without medicine".  She has no SI.  PHQ-9 not completed as subjectively she is doing well. Mood is relaxed and positive today.

## 2022-01-18 NOTE — Assessment & Plan Note (Signed)
Morphea diagnosed by skin biopsy of left medial mid tibial area.  Evaluated by rheumatologist Dr. Benjamine Mola- rheumatology panel is negative for any markers of scleroderma.  She is very very relieved.  Dr. Benjamine Mola has referred her to dermatologist Dr. Sharol Roussel at [facility] for his expert opinion of her morphea.

## 2022-01-18 NOTE — Assessment & Plan Note (Signed)
Ongoing poor sleep and morning headaches.  She had unfortunately received the incorrect information that CPAP units can cause cancer.  I reassured her that this was not the case, and she will consider this again.  Appointment with neurologist up coming-she will discuss.

## 2022-01-18 NOTE — Assessment & Plan Note (Signed)
Evaluation with urologist currently underway-she is scheduled to have cystoscopy I believe.  Painless hematuria has evidently been an ongoing problem.  Fortunately evaluation is occurring.

## 2022-01-18 NOTE — Assessment & Plan Note (Signed)
Upcoming fasting lipids are scheduled with cardiologist, to whom she was referred for incidental finding of coronary atherosclerosis on imaging.

## 2022-01-18 NOTE — Progress Notes (Signed)
Doing much better now that she is getting some answers about her health regarding her morphea; no evidence of scleroderma.  She has had a number of visits with specialists (referral is not required with her insurance); rheumatologist for further clarity of her morphea (work-up revealed no systemic involvement), pulmonologist (she had been describing chest pain which was evaluated by PFTs and high-res chest CT-negative for pulmonary findings, but notable for coronary calcifications), cardiology (for ischemic evaluation upon findings of coronary calcifications), ENT (self-referral for opinion regarding any swallowing problems related to potential scleroderma-none found), GI (evaluation of dysphagia), and urology (evaluation of chronic hematuria).  Hands swell after brisk walking - reassured that this is normal when swinging our arms due to centrifugal force.  She was relieved that it wasn't related to her morphea or a sign of something serious.    [phone] new contact number  Medications reviewed-she is taking the following:  Current Outpatient Medications:    aspirin 81 MG chewable tablet, Chew 81 mg by mouth. 3 times a week, Disp: , Rfl:    BP 127/71 (BP Location: Right Arm, Patient Position: Sitting, Cuff Size: Small)   Pulse 84   Temp 98.4 F (36.9 C) (Oral)   Ht '5\' 5"'$  (1.651 m)   Wt 177 lb 3.2 oz (80.4 kg)   LMP 03/03/2016 (Approximate)   SpO2 99%   BMI 29.49 kg/m  Problem-focused PE in problem based documentation.  Nodule of skin of breast Superficial darkly pigmented firm cutaneous small nodule in outer lower R breast.  All visits have been reassuring.    OSA (obstructive sleep apnea) Ongoing poor sleep and morning headaches.  She had unfortunately received the incorrect information that CPAP units can cause cancer.  I reassured her that this was not the case, and she will consider this again.  Appointment with neurologist up coming-she will discuss.  Morphea Morphea  diagnosed by skin biopsy of left medial mid tibial area.  Evaluated by rheumatologist Dr. Benjamine Mola- rheumatology panel is negative for any markers of scleroderma.  She is very very relieved.  Dr. Benjamine Mola has referred her to dermatologist Dr. Sharol Roussel at [facility] for his expert opinion of her morphea.  History of hyperlipidemia Upcoming fasting lipids are scheduled with cardiologist, to whom she was referred for incidental finding of coronary atherosclerosis on imaging.   Hematuria of unknown etiology Evaluation with urologist currently underway-she is scheduled to have cystoscopy I believe.  Painless hematuria has evidently been an ongoing problem.  Fortunately evaluation is occurring.  Bipolar 1 disorder, depressed, mild ([place]) Depressed mood has improved dramatically upon clarity regarding her many health conditions.  She is noticing increased energy level, is participating in life, is finding enjoyment in family activities (high school graduations) and has benefited from talk therapy with clinical psychologist Dr. Carolynne Edouard.  She wishes to transfer her care to Dr. Aloha Gell new location.  She wants to avoid mood medications "because they cause diabetes, and I do not want that.  Doing fine without medicine".  She has no SI.  PHQ-9 not completed as subjectively she is doing well. Mood is relaxed and positive today.

## 2022-01-18 NOTE — Patient Instructions (Addendum)
Ms. Nicole, [name] to see you today!  I am so happy that you're doing better.  Keep up the healthy lifestyle practices, including exercise (I'm glad you're walking!).    See you in 6 months!  Dr. [name]

## 2022-01-18 NOTE — Assessment & Plan Note (Signed)
Superficial darkly pigmented firm cutaneous small nodule in outer lower R breast.  All visits have been reassuring.

## 2022-01-22 ENCOUNTER — Ambulatory Visit (INDEPENDENT_AMBULATORY_CARE_PROVIDER_SITE_OTHER): Payer: Medicare Other | Admitting: Diagnostic Neuroimaging

## 2022-01-22 ENCOUNTER — Encounter: Payer: Self-pay | Admitting: Diagnostic Neuroimaging

## 2022-01-22 VITALS — BP 115/76 | HR 73 | Ht 60.0 in | Wt 178.0 lb

## 2022-01-22 DIAGNOSIS — G4733 Obstructive sleep apnea (adult) (pediatric): Secondary | ICD-10-CM

## 2022-01-22 DIAGNOSIS — G43009 Migraine without aura, not intractable, without status migrainosus: Secondary | ICD-10-CM

## 2022-01-22 NOTE — Progress Notes (Signed)
[facility]  PATIENT: Nicole [name] DOB: 1960/09/03  REFERRING CLINICIAN: [name], MD HISTORY FROM: patient  REASON FOR VISIT: follow up   HISTORICAL  CHIEF COMPLAINT:  Chief Complaint  Patient presents with   Headache    Rm 6 alone Pt is well, has been having more frequent headaches for about two months. They are daily but come and go throughout the day.      HISTORY OF PRESENT ILLNESS:   UPDATE (01/22/22, VRP): Since last visit, doing about the same. HA are 1-2 per week. Now interested in getting CPAP treatment.  UPDATE (11/24/19, VRP): 61 year old female with headaches; more intense HA since past 1 month. More stress. Taking aspirin as needed for HA. HA are more intense, severe, with dizziness.    UPDATE 06/17/14 (VRP): Since last visit, doing well. No headaches. Not needing triptan. Stress is much improved, and this seems to be helping her headaches. Planning to get CPAP machine soon.    UPDATE 12/16/13 (LL): She returns for migraine follow up, headache are some improved, did not try triptan.  She is concerned that she has sleep apnea and that this is causing her headaches.  She has family reports that she has long apnea pauses when she sleeps, history of snoring, ESS is 20 and FSS is 46 today.     PRIOR HPI (09/14/13): 61 year old right-handed female here for evaluation of headaches. Patient does have headaches since age 52 years old, yearly headaches, with frontal, unilateral sometimes bilateral throbbing pressure sensation. Sometimes associated with stomach pain, photophobia and phonophobia. No nausea or vomiting. Over her life, in the last one to 2 years symptoms have changed. Now she has pinching and hair pulling sensation of the back of her head. These are more significant during periods of stress. From October to December 2014 patient was under higher stress with worse symptoms. In January 2015 symptoms have significantly improved. Patient has  tried oxycodone/Tylenol in the past with mild relief. She's not been officially diagnosed with migraine in the past. She's not officially taken migraine medication in the past. No family history of migraine.    REVIEW OF SYSTEMS: Full 14 system review of systems performed and negative with exception of: as per HPI.   ALLERGIES: Allergies  Allergen Reactions   Other Itching and Hives    Pain medication ordered at [facility] in 2009 - Patient does not know name of medication  Pain medication given at [facility] approximately 2009 Pain medication ordered at [facility] in 2009 - Patient does not know name of medication  Pain medication given at [facility] approximately 2009    HOME MEDICATIONS: Outpatient Medications Prior to Visit  Medication Sig Dispense Refill   aspirin 81 MG chewable tablet Chew 81 mg by mouth. 3 times a week     No facility-administered medications prior to visit.    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 Verl Blalock 2/09   Reflux esophagitis    Sleep apnea    No CPAP   Vaginal discharge 03/02/2020    PAST SURGICAL HISTORY: Past Surgical History:  Procedure Laterality Date   COLONOSCOPY     ECTOPIC PREGNANCY SURGERY     EXPLORATORY LAPAROTOMY  2000   x2 (Dr. Kendall Flack)   HERPES SIMPLEX VIRUS DFA     TUBAL LIGATION  1987    FAMILY HISTORY: Family History  Problem Relation Age of Onset   Hypertension Mother    Diabetes Mother     Heart disease Mother    Thyroid disease Mother    Glaucoma Mother    Diabetes Father    Hypertension Sister    Diabetes Sister    Diabetes Sister    Hypertension Sister    Hypertension Sister    Diabetes Sister    Hypertension Brother    Diabetes Brother    Pancreatic cancer Maternal Uncle    Heart attack Maternal Grandmother    Heart attack Maternal Grandfather    Mental illness Son    Mental illness Son    Pancreatic cancer Other        family history   Colon cancer Neg Hx     SOCIAL HISTORY: Social History   Socioeconomic History   Marital status: Single    Spouse name: Not on file   Number of children: 3   Years of education: Not on file   Highest education level: Not on file  Occupational History   Not on file  Tobacco Use   Smoking status: Every Day    Packs/day: 0.25    Years: 36.00    Pack years: 9.00    Types: Cigarettes    Last attempt to quit: 12/30/2019    Years since quitting: 2.0   Smokeless tobacco: Never   Tobacco comments:    2-4 cigs per day 12/21/21  Vaping Use   Vaping Use: Never used  Substance and Sexual Activity   Alcohol use: Yes    Alcohol/week: 0.0 standard drinks    Comment: Rare glass of wine   Drug use: Yes    Types: Marijuana    Comment: daily, smokes it   Sexual activity: Never  Other Topics Concern   Not on file  Social History Narrative   Current Social History 02/16/2021        Patient lives alone in a [place] which is 2 stories. There are 13 steps with handrails up to the entrance the patient uses.       Patient's method of transportation is city bus.      The highest level of education was high school diploma.      The patient currently disabled.      Identified important Relationships are "My mother, my sisters, children, grandchildren (69)"       Pets : None       Interests / Fun: "Read, watch TV, movies, social media, talk on phone, shopping, travel"       Current Stressors: "Simple people get on my  nerves,;drama."       Religious / Personal Beliefs: "Christian, [place], Pentecostal."       Other: "I stay away from people."      L. Ducatte, BSN, RN-BC              Social Determinants of Health   Financial Resource Strain: Not on file  Food Insecurity:  Food Insecurity Present   Worried About Charity fundraiser in the Last Year: Sometimes true   Arboriculturist in the Last Year: Sometimes true  Transportation Needs: Public librarian (Medical): No   Lack of Transportation (Non-Medical): Yes  Physical Activity: Not on file  Stress: Not on file  Social Connections: Not on file  Intimate Partner Violence: Not on file     PHYSICAL EXAM  GENERAL EXAM/CONSTITUTIONAL: Vitals:  Vitals:   01/22/22 1525  BP: 115/76  Pulse: 73  Weight: 178 lb (80.7 kg)  Height: 5' (1.524 m)   Body mass index is 34.76 kg/m. Wt Readings from Last 3 Encounters:  01/22/22 178 lb (80.7 kg)  01/18/22 177 lb 3.2 oz (80.4 kg)  01/01/22 179 lb 9.6 oz (81.5 kg)   Patient is in no distress; well developed, nourished and groomed; neck is supple  CARDIOVASCULAR: Examination of carotid arteries is normal; no carotid bruits Regular rate and rhythm, no murmurs Examination of peripheral vascular system by observation and palpation is normal  EYES: Ophthalmoscopic exam of optic discs and posterior segments is normal; no papilledema or hemorrhages No results found.  MUSCULOSKELETAL: Gait, strength, tone, movements noted in Neurologic exam below  NEUROLOGIC: MENTAL STATUS:      View : No data to display.         awake, alert, oriented to person, place and time recent and remote memory intact normal attention and concentration language fluent, comprehension intact, naming intact fund of knowledge appropriate  CRANIAL NERVE:  2nd - no papilledema on fundoscopic exam 2nd, 3rd, 4th, 6th - pupils equal and reactive to light, visual fields full to  confrontation, extraocular muscles intact, no nystagmus 5th - facial sensation symmetric 7th - facial strength symmetric 8th - hearing intact 9th - palate elevates symmetrically, uvula midline 11th - shoulder shrug symmetric 12th - tongue protrusion midline  MOTOR:  normal bulk and tone, full strength in the BUE, BLE  SENSORY:  normal and symmetric to light touch, temperature, vibration  COORDINATION:  finger-nose-finger, fine finger movements normal  REFLEXES:  deep tendon reflexes present and symmetric  GAIT/STATION:  narrow based gait     DIAGNOSTIC DATA (LABS, IMAGING, TESTING) - I reviewed patient records, labs, notes, testing and imaging myself where available.  Lab Results  Component Value Date   WBC 4.5 04/07/2021   HGB 12.6 04/07/2021   HCT 37.9 04/07/2021   MCV 93.1 04/07/2021   PLT 222.0 04/07/2021      Component Value Date/Time   NA 139 04/07/2021 1106   NA 141 09/25/2019 0913   K 3.9 04/07/2021 1106   CL 104 04/07/2021 1106   CO2 25 04/07/2021 1106   GLUCOSE 78 04/07/2021 1106   BUN 13 04/07/2021 1106   BUN 12 09/25/2019 0913   CREATININE 0.86 04/07/2021 1106   CREATININE 0.77 10/21/2018 1034   CALCIUM 9.4 04/07/2021 1106   PROT 7.3 04/07/2021 1106   PROT 6.7 12/27/2015 1124   ALBUMIN 4.6 04/07/2021 1106   ALBUMIN 4.4 12/27/2015 1124   AST 15 04/07/2021 1106   ALT 16 04/07/2021 1106   ALKPHOS 65 04/07/2021 1106   BILITOT 0.6 04/07/2021 1106   BILITOT 0.3 12/27/2015 1124   GFRNONAA 74 09/25/2019 0913   GFRNONAA 85 10/21/2018 1034   GFRAA 85 09/25/2019 0913   GFRAA 98 10/21/2018 1034   Lab Results  Component Value Date   CHOL 200 (H) 02/09/2021   HDL 61  02/09/2021   LDLCALC 128 (H) 02/09/2021   TRIG 63 02/09/2021   CHOLHDL 3.3 02/09/2021   Lab Results  Component Value Date   HGBA1C 5.8 (A) 09/01/2021   Lab Results  Component Value Date   VITAMINB12 362 12/27/2015   Lab Results  Component Value Date   TSH 0.994 02/09/2021     12/19/13 MRI brain - Normal MRI scan of the brain without contrast.   01/28/14 PSG - moderate OSA with REM sleep  12/21/19 MRI brain Unremarkable MRI brain with and without contrast.  No acute findings.   ASSESSMENT AND PLAN  61 y.o. year old female here with with migraine without aura since age 66-76 years old. Symptoms worse with stress.   Dx:  1. Migraine without aura and without status migrainosus, not intractable   2. OSA (obstructive sleep apnea)       PLAN:  MIGRAINE WITHOUT AURA - offered topiramate + rizatriptan; patient wants to hold off - continue ibuprofen as needed  SLEEP APNEA - last test in 2015; now wants to try CPAP; will setup referral  Orders Placed This Encounter  Procedures   Ambulatory referral to Sleep Studies   Return for pending if symptoms worsen or fail to improve.    Penni Bombard, MD 09/23/8248, 0:37 PM Certified in Neurology, Neurophysiology and Neuroimaging  [facility] [address], [place], [place] [address] [phone]

## 2022-01-22 NOTE — Patient Instructions (Signed)
  MIGRAINE WITHOUT AURA - offered topiramate + rizatriptan; patient wants to hold off - continue ibuprofen as needed  SLEEP APNEA - last test in 2015; now wants to try CPAP; will setup referral

## 2022-01-26 ENCOUNTER — Ambulatory Visit (HOSPITAL_COMMUNITY)
Admission: RE | Admit: 2022-01-26 | Discharge: 2022-01-26 | Disposition: A | Discharge status: Home / Self Care | Payer: Medicare Other | Source: Ambulatory Visit | Attending: Internal Medicine | Admitting: Internal Medicine

## 2022-01-26 DIAGNOSIS — R1319 Other dysphagia: Secondary | ICD-10-CM | POA: Diagnosis not present

## 2022-01-26 DIAGNOSIS — K224 Dyskinesia of esophagus: Secondary | ICD-10-CM | POA: Diagnosis not present

## 2022-01-26 DIAGNOSIS — K219 Gastro-esophageal reflux disease without esophagitis: Secondary | ICD-10-CM | POA: Diagnosis not present

## 2022-01-26 DIAGNOSIS — R1310 Dysphagia, unspecified: Secondary | ICD-10-CM | POA: Diagnosis not present

## 2022-02-12 ENCOUNTER — Telehealth (HOSPITAL_COMMUNITY): Payer: Self-pay | Admitting: Emergency Medicine

## 2022-02-12 NOTE — Telephone Encounter (Signed)
Attempted to call patient regarding upcoming cardiac PET appointment. Left message on voicemail with name and callback number Aidan Moten RN Navigator Cardiac Imaging [facility] [phone] Office [phone] Cell  

## 2022-02-13 ENCOUNTER — Encounter (HOSPITAL_COMMUNITY)
Admission: RE | Admit: 2022-02-13 | Discharge: 2022-02-13 | Disposition: A | Discharge status: Home / Self Care | Payer: Medicare Other | Source: Ambulatory Visit | Attending: Student | Admitting: Student

## 2022-02-13 ENCOUNTER — Encounter (HOSPITAL_COMMUNITY): Payer: Self-pay

## 2022-02-13 DIAGNOSIS — R0789 Other chest pain: Secondary | ICD-10-CM | POA: Insufficient documentation

## 2022-02-13 MED ORDER — REGADENOSON 0.4 MG/5ML IV SOLN
INTRAVENOUS | Status: AC
  Filled 2022-02-13: qty 5

## 2022-02-27 DIAGNOSIS — I8312 Varicose veins of left lower extremity with inflammation: Secondary | ICD-10-CM | POA: Diagnosis not present

## 2022-02-27 DIAGNOSIS — I8311 Varicose veins of right lower extremity with inflammation: Secondary | ICD-10-CM | POA: Diagnosis not present

## 2022-03-05 ENCOUNTER — Encounter: Payer: Self-pay | Admitting: Internal Medicine

## 2022-03-05 ENCOUNTER — Ambulatory Visit (AMBULATORY_SURGERY_CENTER): Payer: Medicare Other | Admitting: Internal Medicine

## 2022-03-05 VITALS — BP 96/63 | HR 55 | Temp 97.8°F | Resp 15 | Ht 65.0 in | Wt 177.0 lb

## 2022-03-05 DIAGNOSIS — R12 Heartburn: Secondary | ICD-10-CM | POA: Diagnosis not present

## 2022-03-05 DIAGNOSIS — R1310 Dysphagia, unspecified: Secondary | ICD-10-CM | POA: Diagnosis not present

## 2022-03-05 DIAGNOSIS — R1319 Other dysphagia: Secondary | ICD-10-CM | POA: Diagnosis not present

## 2022-03-05 MED ORDER — SODIUM CHLORIDE 0.9 % IV SOLN: 500.0000 mL | INTRAVENOUS | Status: DC

## 2022-03-05 NOTE — Progress Notes (Signed)
[facility] History and Physical   Primary Care Physician:  [name], MD   Reason for Procedure:   dysphagia  Plan:    EGD, possible esophageal dilation     HPI: Nicole [name] is a 61 y.o. female here for evaluation of dysphagia. Has ? 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 Verl Blalock 2/09   Reflux esophagitis    Sleep apnea    No CPAP   Vaginal discharge 03/02/2020    Past Surgical History:  Procedure Laterality Date   COLONOSCOPY     ECTOPIC PREGNANCY SURGERY     EXPLORATORY LAPAROTOMY  2000   x2 (Dr. Kendall Flack)   [place] VIRUS DFA     [place]    Prior to Admission medications   Medication Sig Start Date End Date Taking? Authorizing Provider  aspirin 81 MG chewable tablet Chew 81 mg by mouth. 3 times a week   Yes [provider]    Current Outpatient Medications  Medication Sig Dispense Refill   aspirin 81 MG chewable tablet Chew 81 mg by mouth. 3 times a week     Current Facility-Administered Medications  Medication Dose Route Frequency Provider Last Rate Last Admin   0.9 %  sodium chloride infusion  500 mL Intravenous Continuous Gatha Mayer, MD        Allergies as of 03/05/2022 - Review Complete 03/05/2022  Allergen Reaction Noted   Other Itching and Hives 04/30/2012    Family History  Problem Relation Age of Onset    Hypertension Mother    Diabetes Mother    Heart disease Mother    Thyroid disease Mother    Glaucoma Mother    Diabetes Father    Hypertension Sister    Diabetes Sister    Diabetes Sister    Hypertension Sister    Hypertension Sister    Diabetes Sister    Hypertension Brother    Diabetes Brother    Pancreatic cancer Maternal Uncle    Heart attack Maternal Grandmother    Heart attack Maternal Grandfather    Mental illness Son    Mental illness Son    Pancreatic cancer Other        family history   Colon cancer Neg Hx     Social History   Socioeconomic History   Marital status: Single    Spouse name: Not on file   Number of children: 3   Years of education: Not on file   Highest education level: Not on file  Occupational History   Not on file  Tobacco Use   Smoking status: Every Day    Packs/day: 0.25    Years: 36.00    Total pack years: 9.00    Types: Cigarettes    Last attempt to  quit: 12/30/2019    Years since quitting: 2.1   Smokeless tobacco: Never   Tobacco comments:    2-4 cigs per day 12/21/21  Vaping Use   Vaping Use: Never used  Substance and Sexual Activity   Alcohol use: Yes    Alcohol/week: 0.0 standard drinks of alcohol    Comment: Rare glass of wine   Drug use: Yes    Types: Marijuana    Comment: daily, smokes it   Sexual activity: Never  Other Topics Concern   Not on file  Social History Narrative   Current Social History 02/16/2021        Patient lives alone in a [place] which is 2 stories. There are 13 steps with handrails up to the entrance the patient uses.       Patient's method of transportation is city bus.      The highest level of education was high school diploma.      The patient currently disabled.      Identified important Relationships are "My mother, my sisters, children, grandchildren (38)"       Pets : None       Interests / Fun: "Read, watch TV, movies, social media, talk on phone, shopping, travel"       Current  Stressors: "Simple people get on my nerves,;drama."       Religious / Personal Beliefs: "Christian, [place], Pentecostal."       Other: "I stay away from people."      L. Ducatte, BSN, RN-BC              Social Determinants of Health   Financial Resource Strain: Not on file  Food Insecurity: Food Insecurity Present (08/25/2021)   Hunger Vital Sign    Worried About Running Out of Food in the Last Year: Sometimes true    Ran Out of Food in the Last Year: Sometimes true  Transportation Needs: Unmet Transportation Needs (08/25/2021)   PRAPARE - Hydrologist (Medical): No    Lack of Transportation (Non-Medical): Yes  Physical Activity: Not on file  Stress: Not on file  Social Connections: Not on file  Intimate Partner Violence: Not on file    Review of Systems:  All other review of systems negative except as mentioned in the HPI.  Physical Exam: Vital signs BP 135/75   Pulse 61   Temp 97.8 F (36.6 C) (Temporal)   Resp 16   Ht '5\' 5"'$  (1.651 m)   Wt 177 lb (80.3 kg)   LMP 03/03/2016 (Approximate)   SpO2 100%   BMI 29.45 kg/m   General:   Alert,  Well-developed, well-nourished, pleasant and cooperative in NAD Lungs:  Clear throughout to auscultation.   Heart:  Regular rate and rhythm; no murmurs, clicks, rubs,  or gallops. Abdomen:  Soft, nontender and nondistended. Normal bowel sounds.   Neuro/Psych:  Alert and cooperative. Normal mood and affect. A and O x 3   '@Braylen Staller'$  Simonne Maffucci, MD, [facility] [phone] (pager) 03/05/2022 9:59 AM@

## 2022-03-05 NOTE — Patient Instructions (Addendum)
This exam is normal. Glad to hear you do not have scleroderma.  Please follow-up as needed.  I appreciate the opportunity to care for you. Gatha Mayer, MD, FACG  YOU HAD AN ENDOSCOPIC PROCEDURE TODAY AT [facility]:   Refer to the procedure report that was given to you for any specific questions about what was found during the examination.  If the procedure report does not answer your questions, please call your gastroenterologist to clarify.  If you requested that your care partner not be given the details of your procedure findings, then the procedure report has been included in a sealed envelope for you to review at your convenience later.  YOU SHOULD EXPECT: Some feelings of bloating in the abdomen. Passage of more gas than usual.  Walking can help get rid of the air that was put into your GI tract during the procedure and reduce the bloating. If you had a lower endoscopy (such as a colonoscopy or flexible sigmoidoscopy) you may notice spotting of blood in your stool or on the toilet paper. If you underwent a bowel prep for your procedure, you may not have a normal bowel movement for a few days.  Please Note:  You might notice some irritation and congestion in your nose or some drainage.  This is from the oxygen used during your procedure.  There is no need for concern and it should clear up in a day or so.  SYMPTOMS TO REPORT IMMEDIATELY:  Following upper endoscopy (EGD)  Vomiting of blood or coffee ground material  New chest pain or pain under the shoulder blades  Painful or persistently difficult swallowing  New shortness of breath  Fever of 100F or higher  Black, tarry-looking stools  For urgent or emergent issues, a gastroenterologist can be reached at any hour by calling [phone]. Do not use MyChart messaging for urgent concerns.    DIET:  We do recommend a small meal at first, but then you may proceed to your regular diet.  Drink plenty of fluids but  you should avoid alcoholic beverages for 24 hours.  ACTIVITY:  You should plan to take it easy for the rest of today and you should NOT DRIVE or use heavy machinery until tomorrow (because of the sedation medicines used during the test).    FOLLOW UP: Our staff will call the number listed on your records the next business day following your procedure.  We will call around 7:15- 8:00 am to check on you and address any questions or concerns that you may have regarding the information given to you following your procedure. If we do not reach you, we will leave a message.  If you develop any symptoms (ie: fever, flu-like symptoms, shortness of breath, cough etc.) before then, please call [phone].  If you test positive for Covid 19 in the 2 weeks post procedure, please call and report this information to [place].    If any biopsies were taken you will be contacted by phone or by letter within the next 1-3 weeks.  Please call us at [phone] if you have not heard about the biopsies in 3 weeks.    SIGNATURES/CONFIDENTIALITY: You and/or your care partner have signed paperwork which will be entered into your electronic medical record.  These signatures attest to the fact that that the information above on your After Visit Summary has been reviewed and is understood.  Full responsibility of the confidentiality of this discharge information lies with you  and/or your care-partner.

## 2022-03-05 NOTE — Progress Notes (Signed)
Report to PACU, RN, vss, BBS= Clear.  

## 2022-03-05 NOTE — Op Note (Signed)
[facility] Patient Name: Nicole [name] Procedure Date: 03/05/2022 9:49 AM MRN: [id number] Endoscopist: Gatha Mayer , MD Age: 61 Referring MD:  Date of Birth: 1960-12-16 Gender: Female Account #: [id number] Procedure:                Upper GI endoscopy Indications:              Dysphagia, Heartburn Medicines:                Monitored Anesthesia Care Procedure:                Pre-Anesthesia Assessment:                           - Prior to the procedure, a History and Physical                            was performed, and patient medications and                            allergies were reviewed. The patient's tolerance of                            previous anesthesia was also reviewed. The risks                            and benefits of the procedure and the sedation                            options and risks were discussed with the patient.                            All questions were answered, and informed consent                            was obtained. Prior Anticoagulants: The patient has                            taken no previous anticoagulant or antiplatelet                            agents. ASA Grade Assessment: II - A patient with                            mild systemic disease. After reviewing the risks                            and benefits, the patient was deemed in                            satisfactory condition to undergo the procedure.                           After obtaining informed consent, the endoscope was  passed under direct vision. Throughout the                            procedure, the patient's blood pressure, pulse, and                            oxygen saturations were monitored continuously. The                            Endoscope was introduced through the mouth, and                            advanced to the second part of duodenum. The upper                            GI endoscopy was accomplished without  difficulty.                            The patient tolerated the procedure well. Scope In: Scope Out: Findings:                 The esophagus was normal.                           The stomach was normal.                           The examined duodenum was normal.                           The cardia and gastric fundus were normal on                            retroflexion. Complications:            No immediate complications. Estimated Blood Loss:     Estimated blood loss: none. Impression:               - Normal esophagus.                           - Normal stomach.                           - Normal examined duodenum.                           - No specimens collected. Recommendation:           - Patient has a contact number available for                            emergencies. The signs and symptoms of potential                            delayed complications were discussed with the  patient. Return to normal activities tomorrow.                            Written discharge instructions were provided to the                            patient.                           - Resume previous diet.                           - Continue present medications.                           - F/U PRN - she does not have scleroderma and has                            not had recurrent dysphagia (mild dysmotility on Ba                            swallow) Gatha Mayer, MD 03/05/2022 10:16:50 AM This report has been signed electronically.

## 2022-03-06 ENCOUNTER — Telehealth: Payer: Self-pay

## 2022-03-06 NOTE — Telephone Encounter (Signed)
No answer on follow up call. No mailbox

## 2022-03-08 ENCOUNTER — Institutional Professional Consult (permissible substitution): Payer: Medicare Other | Admitting: Neurology

## 2022-03-09 ENCOUNTER — Telehealth: Payer: Self-pay | Admitting: Internal Medicine

## 2022-03-09 DIAGNOSIS — R3121 Asymptomatic microscopic hematuria: Secondary | ICD-10-CM | POA: Diagnosis not present

## 2022-03-12 NOTE — Telephone Encounter (Signed)
Pt stated that she had pain under her shoulder blade although it has resolved now: Pt verbalized understanding with all questions answered.

## 2022-03-13 ENCOUNTER — Other Ambulatory Visit (HOSPITAL_COMMUNITY): Payer: Medicare Other

## 2022-03-20 ENCOUNTER — Ambulatory Visit (INDEPENDENT_AMBULATORY_CARE_PROVIDER_SITE_OTHER): Payer: Medicare Other | Admitting: Neurology

## 2022-03-20 ENCOUNTER — Encounter: Payer: Self-pay | Admitting: Neurology

## 2022-03-20 VITALS — BP 111/67 | HR 95 | Ht 60.0 in | Wt 173.0 lb

## 2022-03-20 DIAGNOSIS — I2510 Atherosclerotic heart disease of native coronary artery without angina pectoris: Secondary | ICD-10-CM

## 2022-03-20 DIAGNOSIS — R0683 Snoring: Secondary | ICD-10-CM | POA: Diagnosis not present

## 2022-03-20 DIAGNOSIS — R519 Headache, unspecified: Secondary | ICD-10-CM | POA: Diagnosis not present

## 2022-03-20 DIAGNOSIS — I2584 Coronary atherosclerosis due to calcified coronary lesion: Secondary | ICD-10-CM | POA: Diagnosis not present

## 2022-03-20 DIAGNOSIS — G4719 Other hypersomnia: Secondary | ICD-10-CM

## 2022-03-20 DIAGNOSIS — G4733 Obstructive sleep apnea (adult) (pediatric): Secondary | ICD-10-CM | POA: Diagnosis not present

## 2022-03-20 NOTE — Progress Notes (Signed)
Subjective:    Patient ID: Nicole [name] is a 61 y.o. female.  HPI    Star Age, MD, PhD [facility] [address] [place], [place] [address]  Dear Nicole [name],   I saw your patient, Nicole [name], upon your kind request in my sleep clinic today for initial consultation of her sleep disorder, in particular, evaluation of her prior diagnosis of obstructive sleep apnea.  The patient is unaccompanied today.  As you know, Nicole [name] is a 61 year old right-handed woman with an underlying medical history of migraine headaches, anemia, anxiety, reflux disease, hypotension, hyperlipidemia, palpitations, depression, and obesity, who reports chronic difficulty maintaining sleep and daytime somnolence.  She has had morning headaches.  She continues to snore.  She is currently not on any prescription medicine for her headaches.  She does not take anything to help her sleep at night but does report that marijuana relaxes her and she smokes it about 3 times a day.  She smokes cigarettes about one or two per day on average.  She goes to bed around 10 and while she falls asleep okay, she wakes up around midnight and is up for several hours.  She does have a TV on in her bedroom and it tends to stay on all night.  Rise time varies.  She may be out of bed by 6:30 AM.  She does not have night to night nocturia.  Her sister has sleep apnea and has a CPAP machine.  I reviewed your office note from 01/22/2022.  Her Epworth sleepiness score is 10 out of 24, fatigue severity score is 37 out of 63.  She had a baseline sleep study through our office on 01/27/2014 which showed an AHI of 6/h, REM AHI 27/h, O2 nadir 81%.  She had a subsequent titration study on 07/26/2014 at which time she did well with CPAP of 6 cm.  She did not end up starting CPAP therapy at the time.  That she did not think she would tolerate the pressure.  She does not drink caffeine daily.  She has no pets in the household, she is  single and lives alone.  She does not currently work.  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 Verl Blalock 2/09   Reflux esophagitis    Sleep apnea    No CPAP   Vaginal discharge 03/02/2020    Her Past Surgical History Is Significant For: Past Surgical History:  Procedure Laterality Date   COLONOSCOPY     ECTOPIC PREGNANCY SURGERY     EXPLORATORY LAPAROTOMY  2000   x2 (Dr. Kendall Flack)   HERPES SIMPLEX VIRUS DFA     [place]    Her Family History Is Significant For: Family History  Problem Relation Age of Onset   Hypertension Mother    Diabetes Mother    Heart  disease Mother    Thyroid disease Mother    Glaucoma Mother    Diabetes Father    Hypertension Sister    Diabetes Sister    Diabetes Sister    Hypertension Sister    Sleep apnea Sister    Hypertension Sister    Diabetes Sister    Hypertension Brother    Diabetes Brother    Pancreatic cancer Maternal Uncle    Heart attack Maternal Grandmother    Heart attack Maternal Grandfather    Mental illness Son    Mental illness Son    Pancreatic cancer Other        family history   Colon cancer Neg Hx     Her Social History Is Significant For: Social History   Socioeconomic History   Marital status: Single    Spouse name: Not on file   Number of children: 3   Years of education: Not on file   Highest education level: Not on file  Occupational History   Not on file  Tobacco Use    Smoking status: Every Day    Packs/day: 0.25    Years: 36.00    Total pack years: 9.00    Types: Cigarettes    Last attempt to quit: 12/30/2019    Years since quitting: 2.2   Smokeless tobacco: Never   Tobacco comments:    2-4 cigs per day 12/21/21  Vaping Use   Vaping Use: Never used  Substance and Sexual Activity   Alcohol use: Yes    Alcohol/week: 0.0 standard drinks of alcohol    Comment: Rare glass of wine   Drug use: Yes    Types: Marijuana    Comment: daily, smokes it   Sexual activity: Never  Other Topics Concern   Not on file  Social History Narrative   Current Social History 02/16/2021        Patient lives alone in a [place] which is 2 stories. There are 13 steps with handrails up to the entrance the patient uses.       Patient's method of transportation is city bus.      The highest level of education was high school diploma.      The patient currently disabled.      Identified important Relationships are "My mother, my sisters, children, grandchildren ([phone])"       Pets : None       Interests / Fun: "Read, watch TV, movies, social media, talk on phone, shopping, travel"       Current Stressors: "Simple people get on my nerves,;drama."       Religious / Personal Beliefs: "Christian, [place], Pentecostal."       Other: "I stay away from people."      L. [place], BSN, RN-BC              Social Determinants of Health   Financial Resource Strain: Not on file  Food Insecurity: Food Insecurity Present (08/25/2021)   Hunger Vital Sign    Worried About Running Out of Food in the Last Year: Sometimes true    Ran Out of Food in the Last Year: Sometimes true  Transportation Needs: Unmet Transportation Needs (08/25/2021)   PRAPARE - Hydrologist (Medical): No    Lack of Transportation (Non-Medical): Yes  Physical Activity: Not on file  Stress: Not on file  Social Connections: Not on file    Her Allergies Are:  Allergies   Allergen Reactions  Other Itching and Hives    Pain medication ordered at [facility] in 2009 - Patient does not know name of medication  Pain medication given at [facility] approximately 2009 Pain medication ordered at [facility] in 2009 - Patient does not know name of medication  Pain medication given at [facility] approximately 2009  :   Her Current Medications Are:  Outpatient Encounter Medications as of 03/20/2022  Medication Sig   aspirin 81 MG chewable tablet Chew 81 mg by mouth. 3 times a week   No facility-administered encounter medications on file as of 03/20/2022.  :   Review of Systems:  Out of a complete 14 point review of systems, all are reviewed and negative with the exception of these symptoms as listed below: Review of Systems  Neurological:        Pt here for sleep consult  Pt snores ,fatigue,headaches. Pt denies hypertension.,CPAP pt states that she had sleep study several years ago but didn't get a CPAP machine    ESS:10 FSS:37     Objective:  Neurological Exam  Physical Exam Physical Examination:   Vitals:   03/20/22 1243  BP: 111/67  Pulse: 95    General Examination: The patient is a very pleasant 61 y.o. female in no acute distress. She appears well-developed and well-nourished and well groomed.   HEENT: Normocephalic, atraumatic, pupils are equal, round and reactive to light, extraocular tracking is good without limitation to gaze excursion or nystagmus noted. Hearing is grossly intact. Face is symmetric with normal facial animation. Speech is clear with no dysarthria noted. There is no hypophonia. There is no lip, neck/head, jaw or voice tremor. Neck is supple with full range of passive and active motion. There are no carotid bruits on auscultation. Oropharynx exam reveals: mild mouth dryness, adequate dental hygiene and mild airway crowding, due to redundant soft palate.  Tonsils on the smaller side, Mallampati class II.  Neck  circumference 13-1/2 inches, moderate overbite.  Tongue protrudes centrally and palate elevates symmetrically.  Chest: Clear to auscultation without wheezing, rhonchi or crackles noted.  Heart: S1+S2+0, regular and normal without murmurs, rubs or gallops noted.   Abdomen: Soft, non-tender and non-distended.  Extremities: There is no pitting edema in the distal lower extremities bilaterally.   Skin: Warm and dry without trophic changes noted.   Musculoskeletal: exam reveals no obvious joint deformities.   Neurologically:  Mental status: The patient is awake, alert and oriented in all 4 spheres. Her immediate and remote memory, attention, language skills and fund of knowledge are appropriate. There is no evidence of aphasia, agnosia, apraxia or anomia. Speech is clear with normal prosody and enunciation. Thought process is linear. Mood is normal and affect is normal.  Cranial nerves II - XII are as described above under HEENT exam.  Motor exam: Normal bulk, strength and tone is noted. There is no obvious tremor.  Fine motor skills and coordination: grossly intact.  Cerebellar testing: No dysmetria or intention tremor. There is no truncal or gait ataxia.  Sensory exam: intact to light touch in the upper and lower extremities.            Assessment and Plan:  In summary, ORIT SANVILLE is a very pleasant 61 y.o.-year old female with an underlying medical history of migraine headaches, anemia, anxiety, reflux disease, hypotension, hyperlipidemia, palpitations, depression, and obesity, who presents for evaluation of her sleep apnea which was deemed in the mild range by baseline sleep testing  in 2015.  She has had some weight fluctuation.  She endorses snoring and difficulty maintaining sleep as well as morning headaches.   I talked to the patient about OSA, its prognosis and treatment options. We talked about medical/conservative treatments, surgical interventions and non-pharmacological approaches  for symptom control. I explained, in particular, the risks and ramifications of untreated moderate to severe OSA, especially with respect to developing cardiovascular disease down the road, including congestive heart failure (CHF), difficult to treat hypertension, cardiac arrhythmias (particularly A-fib), neurovascular complications including TIA, stroke and dementia. Even type 2 diabetes has, in part, been linked to untreated OSA. Symptoms of untreated OSA may include (but may not be limited to) daytime sleepiness, nocturia (i.e. frequent nighttime urination), memory problems, mood irritability and suboptimally controlled or worsening mood disorder such as depression and/or anxiety, lack of energy, lack of motivation, physical discomfort, as well as recurrent headaches, especially morning or nocturnal headaches. We talked about the importance of maintaining a healthy lifestyle and striving for healthy weight.  The importance of complete smoking cessation was also addressed.  She was advised that marijuana use can be disruptive to sleep consolidation and also may cause daytime somnolence.  In addition, we talked about the importance of striving for and maintaining good sleep hygiene. I recommended the following at this time: sleep study.  I outlined the differences between a laboratory attended sleep study which is considered more comprehensive and accurate over the option of a home sleep test (HST); the latter may lead to underestimation of sleep disordered breathing in some instances and does not help with diagnosing upper airway resistance syndrome and is not accurate enough to diagnose primary central sleep apnea typically. I explained the different sleep test procedures to the patient in detail and also outlined possible surgical and non-surgical treatment options of OSA, including the use of a pressure airway pressure (PAP) device (ie CPAP, AutoPAP/APAP or BiPAP in certain circumstances), a custom-made  dental device (aka oral appliance, which would require a referral to a specialist dentist or orthodontist typically, and is generally speaking not considered a good choice for patients with full dentures or edentulous state), upper airway surgical options, such as traditional UPPP (which is not considered a first-line treatment) or the Inspire device (hypoglossal nerve stimulator, which would involve a referral for consultation with an ENT surgeon, after careful selection, following inclusion criteria). I explained the PAP treatment option to the patient in detail, as this is generally considered first-line treatment.  The patient indicated that she would be willing to try PAP therapy, if the need arises. I explained the importance of being compliant with PAP treatment, not only for insurance purposes but primarily to improve patient's symptoms symptoms, and for the patient's long term health benefit, including to reduce Her cardiovascular risks longer-term.  I showed her a model for an AutoPap machine. We will pick up our discussion about the next steps and treatment options after testing.  We will keep her posted as to the test results by phone call and/or MyChart messaging where possible.  We will plan to follow-up in sleep clinic accordingly as well.  I answered all her questions today and the patient was in agreement.   I encouraged her to call with any interim questions, concerns, problems or updates or email [place] through [place].  Generally speaking, sleep test authorizations may take up to 2 weeks, sometimes less, sometimes longer, the patient is encouraged to get in touch with [place] if they do not hear back  from the sleep lab staff directly within the next 2 weeks.  Thank you very much for allowing me to participate in the care of this nice patient. If I can be of any further assistance to you please do not hesitate to talk to me.  Sincerely,   Star Age, MD, PhD

## 2022-03-20 NOTE — Patient Instructions (Signed)
Thank you for choosing [facility] for your sleep related care! It was nice to meet you today!   Here is what we discussed today:    Based on your symptoms and your exam I believe you may be at risk for obstructive sleep apnea (aka OSA). We should proceed with a sleep study to determine whether you do or do not have OSA and how severe it is. Even, if you have mild OSA, I may want you to consider treatment with CPAP, as treatment of even borderline or mild sleep apnea can result and improvement of symptoms such as sleep disruption, daytime sleepiness, nighttime bathroom breaks, restless leg symptoms, improvement of headache syndromes, even improved mood disorder.   As explained, an attended sleep study (meaning you get to stay overnight in the sleep lab), lets [place] monitor sleep-related behaviors such as sleep talking and leg movements in sleep, in addition to monitoring for sleep apnea.  A home sleep test is a screening tool for sleep apnea diagnosis only, but unfortunately, does not help with any other sleep-related diagnoses.  Please remember, the long-term risks and ramifications of untreated moderate to severe obstructive sleep apnea may include (but are not limited to): increased risk for cardiovascular disease, including congestive heart failure, stroke, difficult to control hypertension, treatment resistant obesity, arrhythmias, especially irregular heartbeat commonly known as A. Fib. (atrial fibrillation); even type 2 diabetes has been linked to untreated OSA.   Other correlations that untreated obstructive sleep apnea include macular edema which is swelling of the retina in the eyes, droopy eyelid syndrome, and elevated hemoglobin and hematocrit levels (often referred to as polycythemia).  Sleep apnea can cause disruption of sleep and sleep deprivation in most cases, which, in turn, can cause recurrent headaches, problems with memory, mood, concentration, focus, and vigilance.  Most people with untreated sleep apnea report excessive daytime sleepiness, which can affect their ability to drive. Please do not drive or use heavy equipment or machinery, if you feel sleepy! Patients with sleep apnea can also develop difficulty initiating and maintaining sleep (aka insomnia).   Having sleep apnea may increase your risk for other sleep disorders, including involuntary behaviors sleep such as sleep terrors, sleep talking, sleepwalking.    Having sleep apnea can also increase your risk for restless leg syndrome and leg movements at night.   Please note that untreated obstructive sleep apnea may carry additional perioperative morbidity. Patients with significant obstructive sleep apnea (typically, in the moderate to severe degree) should receive, if possible, perioperative PAP (positive airway pressure) therapy and the surgeons and particularly the anesthesiologists should be informed of the diagnosis and the severity of the sleep disordered breathing.   We will call you or email you through [place] with regards to your test results and plan a follow-up in sleep clinic accordingly. Most likely, you will hear from one of our nurses.   Our sleep lab administrative assistant will call you to schedule your sleep study and give you further instructions, regarding the check in process for the sleep study, arrival time, what to bring, when you can expect to leave after the study, etc., and to answer any other logistical questions you may have. If you don't hear back from her by about 2 weeks from now, please feel free to call her direct line at [phone] or you can call our general clinic number, or email [place] through My Chart.

## 2022-03-28 ENCOUNTER — Telehealth: Payer: Self-pay | Admitting: Neurology

## 2022-03-28 NOTE — Telephone Encounter (Signed)
[employer] medicare/medicaid NPSG (no auth req'd) Pt chose inlab.  Patient is scheduled at [facility] for 04/16/22 at 8 pm.  Mailed packet to the patient.

## 2022-04-05 NOTE — Progress Notes (Signed)
Cardiology Office Note   Date:  04/09/2022   ID:  Nicole [name], Nicole [name] Aug 03, 1961, MRN [id number]  PCP:  [name], MD  Cardiologist:   Minus Breeding, MD Referring:  [name], MD  Chief Complaint  Patient presents with   Palpitations     History of Present Illness: Nicole [name] is a 61 y.o. female who presents for evaluation of chest pain.  I saw her last in 2018 for evaluation of chest pain.  She had a negative POET ([place]ain [place] Exercise Treadmill).    She had an echocardiogram 2009 which was essentially normal.    She was having palpitations and she was to wear a monitor but she did not wear this.    She says that she continues to have symptoms.  These include palpitations.  Neither particularly frequent.  She gets some chest discomfort.  This happens with sporadic nature.  It may or may not come on with activities.  It might be sharp or dull.  She does not describe associated nausea vomiting or diaphoresis.  It happens at times when she smokes marijuana but other times when she does not.  She can be physically active without bringing this on.  There is no radiation to her neck or to her arms.  She is having no new PND or orthopnea.    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 Verl Blalock 2/09   Reflux esophagitis    Sleep apnea    No CPAP   Vaginal discharge 03/02/2020    Past Surgical History:  Procedure Laterality Date   COLONOSCOPY     ECTOPIC PREGNANCY SURGERY     EXPLORATORY  LAPAROTOMY  2000   x2 (Dr. Kendall Flack)   HERPES SIMPLEX VIRUS DFA     TUBAL LIGATION  1987    Current Outpatient Medications  Medication Sig Dispense Refill   aspirin 81 MG chewable tablet Chew 81 mg by mouth. 3 times a week     No current facility-administered medications for this visit.    Allergies:   Other    ROS:  Please see the history of present illness.   Otherwise, review of systems are positive for none.   All other systems are reviewed and negative.    PHYSICAL EXAM: VS:  BP 125/82   Pulse 65   Ht 5' (1.524 m)   Wt 171 lb 3.2 oz (77.7 kg)   LMP 03/03/2016 (Approximate)   BMI 33.44 kg/m  , BMI Body mass index is 33.44 kg/m. GENERAL:  Well appearing NECK:  No jugular venous distention, waveform within normal limits, carotid upstroke brisk and symmetric, no bruits, no thyromegaly LUNGS:  Clear to auscultation bilaterally CHEST:  Unremarkable HEART:  PMI not displaced or sustained,S1 and S2 within normal limits, no S3, no S4, no clicks, no rubs, no murmurs ABD:  Flat, positive bowel sounds normal in frequency in  pitch, no bruits, no rebound, no guarding, no midline pulsatile mass, no hepatomegaly, no splenomegaly EXT:  2 plus pulses throughout, no edema, no cyanosis no clubbing  EKG:  EKG is   ordered today. The ekg ordered today demonstrates sinus rhythm, rate 65.,  Axis within normal limits, intervals within normal limits, no acute ST-T wave changes.   Recent Labs: 11/08/2021: NT-Pro BNP 65    Lipid Panel    Component Value Date/Time   CHOL 200 (H) 02/09/2021 0928   TRIG 63 02/09/2021 0928   HDL 61 02/09/2021 0928   CHOLHDL 3.3 02/09/2021 0928   CHOLHDL 3.1 Ratio 01/04/2010 2112   VLDL 13 01/04/2010 2112   LDLCALC 128 (H) 02/09/2021 0928      Wt Readings from Last 3 Encounters:  04/09/22 171 lb 3.2 oz (77.7 kg)  03/20/22 173 lb (78.5 kg)  03/05/22 177 lb (80.3 kg)      Other studies Reviewed: Additional studies/ records that were reviewed  today include: Labs Review of the above records demonstrates:  Please see elsewhere in the note.     ASSESSMENT AND PLAN:  PALPITATIONS: The patient does occasionally have palpitations.  We were unable to get her to wear a monitor unfortunately.    She was told by a  [place] which she has yet to do.  She will look into buying this and no further imaging or treatment given her mild symptoms as indicated and so she shows me any sustained arrhythmia.    MARIJUANA USE:    She continues to use too much marijuana.  ELEVATED CORONARY CALCIUM: She was having chest discomfort as above and I tried to order a PET scan but she could not do this.  She became afraid.  She does think that she she could do a coronary CT angiogram because she does not want to take a beta blocker.  RISK REDUCTION: He has had elevated LDL at 128 with an HDL of 61.  I would like to get fasting lipid profile.  Current medicines are reviewed at length with the patient today.  The patient does not have concerns regarding medicines.  The following changes have been made:  None  Labs/ tests ordered today include: ,     Orders Placed This Encounter  Procedures   Lipid panel   Exercise Tolerance Test   EKG 12-Lead      Disposition:   FU with me as needed based on results of the results.     Signed, Minus Breeding, MD  04/09/2022 9:54 AM    [facility]

## 2022-04-09 ENCOUNTER — Encounter: Payer: Self-pay | Admitting: Cardiology

## 2022-04-09 ENCOUNTER — Ambulatory Visit (INDEPENDENT_AMBULATORY_CARE_PROVIDER_SITE_OTHER): Payer: Medicare Other | Admitting: Cardiology

## 2022-04-09 VITALS — BP 125/82 | HR 65 | Ht 60.0 in | Wt 171.2 lb

## 2022-04-09 DIAGNOSIS — R931 Abnormal findings on diagnostic imaging of heart and coronary circulation: Secondary | ICD-10-CM

## 2022-04-09 DIAGNOSIS — R0789 Other chest pain: Secondary | ICD-10-CM | POA: Diagnosis not present

## 2022-04-09 DIAGNOSIS — R002 Palpitations: Secondary | ICD-10-CM

## 2022-04-09 LAB — LIPID PANEL
Chol/HDL Ratio: 3.5 ratio (ref 0.0–4.4)
Cholesterol, Total: 231 mg/dL — ABNORMAL HIGH (ref 100–199)
HDL: 66 mg/dL (ref >39)
LDL Chol Calc (NIH): 150 mg/dL — ABNORMAL HIGH (ref 0–99)
Triglycerides: 85 mg/dL (ref 0–149)
VLDL Cholesterol Cal: 15 mg/dL (ref 5–40)

## 2022-04-09 MED ORDER — METOPROLOL TARTRATE 50 MG PO TABS: ORAL_TABLET | ORAL | 0 refills | Status: DC

## 2022-04-09 NOTE — Patient Instructions (Addendum)
  Testing/Procedures:  Your physician has requested that you have an exercise tolerance test. For further information please visit [email or web address]. Please also follow instruction sheet, as given. [facility]  Follow-Up: At [facility], you and your health needs are our priority.  As part of our continuing mission to provide you with exceptional heart care, we have created designated Provider Care Teams.  These Care Teams include your primary Cardiologist (physician) and Advanced Practice Providers (APPs -  Physician Assistants and Nurse Practitioners) who all work together to provide you with the care you need, when you need it.  We recommend signing up for the patient portal called "MyChart".  Sign up information is provided on this After Visit Summary.  MyChart is used to connect with patients for Virtual Visits (Telemedicine).  Patients are able to view lab/test results, encounter notes, upcoming appointments, etc.  Non-urgent messages can be sent to your provider as well.   To learn more about what you can do with MyChart, go to [email or web address].    Your next appointment:    As needed

## 2022-04-12 ENCOUNTER — Telehealth (HOSPITAL_COMMUNITY): Payer: Self-pay | Admitting: *Deleted

## 2022-04-12 ENCOUNTER — Ambulatory Visit (HOSPITAL_COMMUNITY)
Admission: RE | Admit: 2022-04-12 | Payer: Medicare Other | Source: Ambulatory Visit | Attending: Cardiology | Admitting: Cardiology

## 2022-04-12 NOTE — Telephone Encounter (Signed)
Close encounter 

## 2022-04-13 ENCOUNTER — Telehealth (HOSPITAL_COMMUNITY): Payer: Self-pay | Admitting: *Deleted

## 2022-04-13 NOTE — Telephone Encounter (Signed)
Close encounter 

## 2022-04-16 ENCOUNTER — Ambulatory Visit (INDEPENDENT_AMBULATORY_CARE_PROVIDER_SITE_OTHER): Payer: Medicare Other | Admitting: Neurology

## 2022-04-16 DIAGNOSIS — G4719 Other hypersomnia: Secondary | ICD-10-CM

## 2022-04-16 DIAGNOSIS — R519 Headache, unspecified: Secondary | ICD-10-CM

## 2022-04-16 DIAGNOSIS — G4733 Obstructive sleep apnea (adult) (pediatric): Secondary | ICD-10-CM

## 2022-04-16 DIAGNOSIS — R0683 Snoring: Secondary | ICD-10-CM | POA: Diagnosis not present

## 2022-04-16 DIAGNOSIS — G4720 Circadian rhythm sleep disorder, unspecified type: Secondary | ICD-10-CM

## 2022-04-17 ENCOUNTER — Other Ambulatory Visit: Payer: Self-pay | Admitting: *Deleted

## 2022-04-17 DIAGNOSIS — R931 Abnormal findings on diagnostic imaging of heart and coronary circulation: Secondary | ICD-10-CM

## 2022-04-17 DIAGNOSIS — E785 Hyperlipidemia, unspecified: Secondary | ICD-10-CM

## 2022-04-18 ENCOUNTER — Ambulatory Visit (HOSPITAL_COMMUNITY)
Admission: RE | Admit: 2022-04-18 | Discharge: 2022-04-18 | Disposition: A | Discharge status: Home / Self Care | Payer: Medicare Other | Source: Ambulatory Visit | Attending: Internal Medicine | Admitting: Internal Medicine

## 2022-04-18 DIAGNOSIS — R931 Abnormal findings on diagnostic imaging of heart and coronary circulation: Secondary | ICD-10-CM | POA: Insufficient documentation

## 2022-04-18 LAB — EXERCISE TOLERANCE TEST
Angina Index: 0
Duke Treadmill Score: 9
Estimated workload: 10.1
Exercise duration (min): 9 min
Exercise duration (sec): 0 s
MPHR: 158 {beats}/min
Peak HR: 148 {beats}/min
Percent HR: 93 %
Rest HR: 68 {beats}/min
ST Depression (mm): 0 mm

## 2022-04-24 NOTE — Progress Notes (Signed)
Nicole [name] is here for routine 66-monthfollow-up, my last visit with her being 01/18/2022.  Since that time, she has been seen by a number of specialists including urology (evaluation of hematuria, work-up underway), GI (dysphagia, EGD normal), neurology (headaches and sleep evaluation, has now undergone sleep study), cardiology (atypical CP and palpitations; ETT normal, lipid panel drawn), and dermatology second opinion at [facility](morphea diagnosis has been revised to lipodermatosclerosis).  Today she wishes to talk about recent blood test at the cardiology office revealing hyperlipidemia.  She is adamant about not be continue medication before integrating lifestyle changes (eliminating butter and other unhealthy fats, focusing more on lean meats and vegetables) and exercise.  She intends to utilize lemon and honey from her own research as a more natural way to address cholesterol. Walks 1-11/2 a day, sometimes to catch a bus, sometimes for exercise.     Brings to my attention foot problems and request referral to podiatrist.  Yesterday hold off the toenail from her left second toe and thought that there was a worm which may have crawled into her foot.  The also has experienced recurrent crops of clear blisters on the lateral sides of her feet - many years.  She attributes it to yeast overgrowth from eating bread.  Physical exam  BP 115/64 (BP Location: Left Arm, Patient Position: Sitting, Cuff Size: Small)   Pulse 68   Temp 98.6 F (37 C) (Oral)   Wt 173 lb 4.8 oz (78.6 kg)   LMP 03/03/2016 (Approximate)   SpO2 97%   BMI 33.85 kg/m   Nicely dressed and groomed.  Calm demeanor.  Speech somewhat tangential.  L 2nd toe nail absent, revealing intact dry nail bed and proximal nailfold.  No surrounding inflammation.  No other obvious onycholysis.  New firm small (2-3 mm) blisters are clustered and single on lateral surfaces of each foot; a 1 cm blister on L lateral heel has unroofed and has a dry dark  appearance.    Assessment and plan: Coronary artery calcification seen on CT scan Atypical chest discomfort and palpitations reported at cardiology visit.  04/09/2022: TC 231, HDL 66, TG 85, LDL 150.  ETT on 04/18/2022 normal.  Cardiologist Nicole [name]  OSA (obstructive sleep apnea) Patient has undergone clinical evaluation by sleep study neurologist as well as an overnight sleep test.  Dysphagia EGD normal.  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 [facility]clinical examination felt to be inconsistent with morphea.  Diagnosed with lipodermatosclerosis, a benign condition, for which there is no particular follow-up unless she has specific concerns.  Advised to wear compression stockings.    Hematuria of unknown etiology Has been seen recently (12/13/21, 03/09/22) [facility]/u of dysuria, hematuria,  and voiding difficulty; PVR 15.  CT hematuria protocol negative for GU malignancy and stone.  Declined cystoscopy at her 02/2022 visit.  Urologist Dr. BGloriann Loanfelt that full cardiac evaluation was needed before any procedure requiring anesthesia.  She had not yet completed a Cardiac MRI (which will probably not be indicated given her normal ETT).    Nonintractable migraine, unspecified migraine type Neurology appointment 01/22/2022: Topiramate and rizatriptan were offered and declined at that time; ibuprofen recommended as needed.  Interested in CPAP, sleep study was ordered.  Prediabetes A1c 5.8 08/2021; recheck every 6-12 months.  Chronic lateral foot blisters Episodic crops of small (infrequently large) clear fluid-filled vesicles on lateral surfaces of feet, not in an area  of friction, may itch or sting or not cause symptoms at all.  Ongoing for years.  Vesicles are deep-seated, not flaccid, and are most consistent with dishydrotic eczema.  She desires referral to podiatrist.  REcommended tx would include topical  corticosteroid.    Bipolar 1 disorder, depressed, mild ([place]) She requests a referral to resume therapy with  Nicole [name] who is now with [facility].  I provided her contact info as her insurance does not require a formal referral.  Hyperlipidemia Lipid panel obtained at recent cardiology visit was reviewed.  She does not wish to take a medicine unless her efforts with lifestyle change (dietary primarily) are unsuccessful. She understands that lipid management is an important element in preventing her CAD (calcifications; asymptomatic) from progressing.  SHe is quite medication-averse.    Health maintenance:  She adamantly refuses a flu shot and does not wish to be asked again.

## 2022-04-25 DIAGNOSIS — G43009 Migraine without aura, not intractable, without status migrainosus: Secondary | ICD-10-CM | POA: Insufficient documentation

## 2022-04-25 DIAGNOSIS — R7303 Prediabetes: Secondary | ICD-10-CM | POA: Insufficient documentation

## 2022-04-25 NOTE — Assessment & Plan Note (Signed)
Atypical chest discomfort and palpitations reported at cardiology visit.  04/09/2022: TC 231, HDL 66, TG 85, LDL 150.  ETT on 04/18/2022 normal.  Cardiologist Dr. Percival Spanish.

## 2022-04-25 NOTE — Assessment & Plan Note (Signed)
Has been seen recently (12/13/21, 03/09/22) at [facility] for f/u of dysuria, hematuria,  and voiding difficulty; PVR 15.  CT hematuria protocol negative for GU malignancy and stone.  Declined cystoscopy at her 02/2022 visit.  Urologist Dr. Gloriann Loan felt that full cardiac evaluation was needed before any procedure requiring anesthesia.  She had not yet completed a Cardiac MRI (which will probably not be indicated given her normal ETT).

## 2022-04-25 NOTE — Assessment & Plan Note (Signed)
EGD normal.

## 2022-04-25 NOTE — Procedures (Signed)
[facility] POLYSOMNOGRAPHY  INTERPRETATION REPORT   STUDY DATE:  04/16/2022     PATIENT NAME:  Nicole [name]         DATE OF BIRTH:  [birth year]  PATIENT ID:  [id number]    TYPE OF STUDY:  PSG  READING PHYSICIAN: Star Age, MD, PhD REFERRED BY: Dr. Leta Baptist SCORING TECHNICIAN: Richard Miu, RPSGT  HISTORY:  61 year old right-handed woman with an underlying medical history of migraine headaches, anemia, anxiety, reflux disease, hypotension, hyperlipidemia, palpitations, depression, and obesity, who reports chronic difficulty maintaining sleep and daytime somnolence.  Height: 60 in Weight: 173 lb (BMI 33) Neck Size: 14 in  MEDICATIONS: Aspirin TECHNICAL DESCRIPTION: A registered sleep technologist (RPSGT)  was in attendance for the duration of the recording.  Data collection, scoring, video monitoring, and reporting were performed in compliance with the AASM Manual for the Scoring of Sleep and Associated Events; (Hypopnea is scored based on the criteria listed in Section VIII D. 1b in the AASM Manual V2.6 using a 4% oxygen desaturation rule or Hypopnea is scored based on the criteria listed in Section VIII D. 1a in the AASM Manual V2.6 using 3% oxygen desaturation and /or arousal rule).   SLEEP CONTINUITY AND SLEEP ARCHITECTURE:  Lights-out was at 20:34: and lights-on at  05:00, with a Total recording time of 8 hours and 27 minutes. Total sleep time ( TST) was 350.0 minutes with a decreased sleep efficiency at 69.0%, which is reduced.   BODY POSITION:  TST was divided  between the following sleep positions: 19.3% supine;  80.7% lateral;  0% prone. Duration of total sleep and percent of total sleep in their respective position is as follows: supine 67 minutes (19%), non-supine 283 minutes (81%); right 59 minutes (17%), left 223 minutes (64%), and prone 00 minutes (0%).  Total supine REM sleep time was 00 minutes (0% of total REM sleep). Sleep latency was  increased at 36.5 minutes. REM sleep latency was mildly reduced at 52.0 minutes. Of the total sleep time, the percentage of stage N1 sleep was 1.9%, stage N2 sleep was 57%, which is mildly increased, stage N3 sleep was 31.4%, which is increased, and REM sleep was 9.6%, which is reduced.  There were 3 Stage R periods observed on this study night, 6 awakenings (i.e. transitions to Stage W from any sleep stage), and 35 total stage transitions. Wake after sleep onset (WASO) time accounted for 119 minutes with mild sleep fragmentation noted and difficulty going back to sleep after her bathroom break in the early morning.  RESPIRATORY MONITORING:  Based on CMS criteria (using a 4% oxygen desaturation rule for scoring hypopneas), there were 1 apneas (1 obstructive; 0 central; 0 mixed), and 4 hypopneas.  Apnea index was 0.2. Hypopnea index was 0.7. The apnea-hypopnea index was 0.9 overall (0.9 supine, 4 non-supine; 3.6 REM, 0.0 supine REM).  There were 0 respiratory effort-related arousals (RERAs).  The RERA index was 0 events/h. Total respiratory disturbance index (RDI) was 0.9 events/h. RDI results showed: supine RDI  0.9 /h; non-supine RDI 0.8 /h; REM RDI 3.6 /h, supine REM RDI 0.0 /h.   Based on AASM criteria (using a 3% oxygen desaturation and /or arousal rule for scoring hypopneas), there were 1 apneas (1 obstructive; 0 central; 0 mixed), and 4 hypopneas. Apnea index was 0.2. Hypopnea index was 0.7. The apnea-hypopnea index was 0.9/hour overall (0.9 supine, 4 non-supine; 3.6 REM, 0.0 supine REM). There were 0 respiratory effort-related arousals (RERAs).  The  RERA index was 0 events/h. Total respiratory disturbance index (RDI) was 0.9 events/h. RDI results showed: supine RDI  0.9 /h; non-supine RDI 0.8 /h; REM RDI 3.6 /h, supine REM RDI 0.0 /h.  OXIMETRY: Oxyhemoglobin Saturation Nadir during sleep was at  90%) from a mean of 94%.  Of the Total sleep time (TST)   hypoxemia (=<88%) was present for  0.0 minutes,  or 0.0% of total sleep time.  LIMB MOVEMENTS: There were 9 periodic limb movements of sleep (1.5/hr), of which 0 (0.0/hr) were associated with an arousal. AROUSAL: There were 70 arousals in total, for an arousal index of 12 arousals/hour.  Of these, 1 were identified as respiratory-related arousals (0 /h), 0 were PLM-related arousals (0 /h), and 73 were non-specific arousals (13 /h). There were 0 occurrences of Cheyne Stokes breathing.  Snoring was classified as not audible. EEG:  EEG was of normal amplitude and frequency, with symmetric manifestation of sleep stages. EKG: The electrocardiogram showed normal sinus rhythm (NSR).  The average heart rate during sleep was 60 bpm.  The heart rate during sleep varied between a minimum of Tachycardia and  a maximum of  74 bpm. AUDIO and VIDEO:  The video and audio analysis did not show any abnormal or unusual behaviors, movements, phonations or vocalizations. The paitient took one bathroom break. Post study, the patient indicated, that sleep was the same as usual. IMPRESSION: Dysfunctions associated with sleep stages or arousal from sleep  RECOMMENDATIONS: 1. This study does not demonstrate any significant obstructive or central sleep disordered breathing with an AHI of less than 5/hour. Her total AHI was 0.9/hour and O2 nadir of 90%. No significant snoring was noted. Treatment with a positive airway pressure device, such as CPAP or autoPAP is not indicated.   2. This study shows sleep fragmentation and abnormal sleep stage percentages; these are nonspecific findings and per se do not signify an intrinsic sleep disorder or a cause for the patient's sleep-related symptoms. Causes include (but are not limited to) the first night effect of the sleep study, circadian rhythm disturbances, medication effect or an underlying mood disorder or medical problem.  3. The patient should be cautioned not to drive, work at heights, or operate dangerous or heavy equipment  when tired or sleepy. Review and reiteration of good sleep hygiene measures should be pursued with any patient. 4. The patient will be advised to follow up with the referring provider, who will be notified of the test results.   I certify that I have reviewed the entire raw data recording prior to the issuance of this report in accordance with the Standards of Accreditation of the American Academy of Sleep Medicine (AASM).  Star Age,  MD, PhD

## 2022-04-25 NOTE — Assessment & Plan Note (Signed)
A1c 5.8 08/2021; recheck every 6-12 months.

## 2022-04-25 NOTE — Assessment & Plan Note (Signed)
Patient has undergone clinical evaluation by sleep study neurologist as well as an overnight sleep test.

## 2022-04-25 NOTE — Assessment & Plan Note (Signed)
Neurology appointment 01/22/2022: Topiramate and rizatriptan were offered and declined at that time; ibuprofen recommended as needed.  Interested in CPAP, sleep study was ordered.

## 2022-04-25 NOTE — Assessment & Plan Note (Addendum)
Second opinion with neurologist Dr. Sharol Roussel at [facility]-clinical examination felt to be inconsistent with morphea.  Diagnosed with lipodermatosclerosis, a benign condition, for which there is no particular follow-up unless she has specific concerns.  Advised to wear compression stockings.

## 2022-04-26 ENCOUNTER — Telehealth: Payer: Self-pay | Admitting: *Deleted

## 2022-04-26 ENCOUNTER — Ambulatory Visit (INDEPENDENT_AMBULATORY_CARE_PROVIDER_SITE_OTHER): Payer: Medicare Other | Admitting: Internal Medicine

## 2022-04-26 VITALS — BP 115/64 | HR 68 | Temp 98.6°F | Ht 60.0 in | Wt 173.3 lb

## 2022-04-26 DIAGNOSIS — Z8639 Personal history of other endocrine, nutritional and metabolic disease: Secondary | ICD-10-CM

## 2022-04-26 DIAGNOSIS — R7303 Prediabetes: Secondary | ICD-10-CM

## 2022-04-26 DIAGNOSIS — G43009 Migraine without aura, not intractable, without status migrainosus: Secondary | ICD-10-CM | POA: Diagnosis not present

## 2022-04-26 DIAGNOSIS — G4733 Obstructive sleep apnea (adult) (pediatric): Secondary | ICD-10-CM | POA: Diagnosis not present

## 2022-04-26 DIAGNOSIS — R238 Other skin changes: Secondary | ICD-10-CM

## 2022-04-26 DIAGNOSIS — R1310 Dysphagia, unspecified: Secondary | ICD-10-CM

## 2022-04-26 DIAGNOSIS — L989 Disorder of the skin and subcutaneous tissue, unspecified: Secondary | ICD-10-CM | POA: Diagnosis not present

## 2022-04-26 DIAGNOSIS — R319 Hematuria, unspecified: Secondary | ICD-10-CM

## 2022-04-26 DIAGNOSIS — L940 Localized scleroderma [morphea]: Secondary | ICD-10-CM | POA: Diagnosis not present

## 2022-04-26 DIAGNOSIS — F3131 Bipolar disorder, current episode depressed, mild: Secondary | ICD-10-CM

## 2022-04-26 DIAGNOSIS — I2510 Atherosclerotic heart disease of native coronary artery without angina pectoris: Secondary | ICD-10-CM | POA: Diagnosis not present

## 2022-04-26 NOTE — Telephone Encounter (Signed)
-----   Message from Star Age, MD sent at 04/25/2022  6:26 PM EDT ----- Patient referred by Dr. Leta Baptist, seen by me on 03/20/22 for re-eval of mild OSA in the past, diagnostic PSG on 04/16/22.   Please call and notify the patient that the recent sleep study did not show any significant obstructive sleep apnea. She does not qualify for treatment with a CPAP machine. At this juncture, she can followup with Dr. Leta [facility].  Thanks,  Star Age, MD, PhD [facility])

## 2022-04-26 NOTE — Telephone Encounter (Signed)
Called pt and LVM (ok per DPR) advising patient that her recent sleep study did not show any significant OSA. She does not qualify for CPAP. At this juncture, she can follow-up with Dr Leta Baptist. Left office number for call back if needed.

## 2022-04-26 NOTE — Patient Instructions (Addendum)
Abner Greenspan to see you today!  I'm glad that so many of these tests are behind you now.  Let's get you over to see the foot doctor to see about that skin.  In the meantime, work on your healthy food choices and your exercise to see if your LDL (bad cholesterol) comes down in response.  Take care and let's get together in about 3 months!    Dr. [name]  [phone] to call Dr. Ronelle Nigh office - she is at [facility] on [address]!

## 2022-04-27 ENCOUNTER — Encounter: Payer: Self-pay | Admitting: Internal Medicine

## 2022-04-27 NOTE — Assessment & Plan Note (Signed)
Episodic crops of small (infrequently large) clear fluid-filled vesicles on lateral surfaces of feet, not in an area of friction, may itch or sting or not cause symptoms at all.  Ongoing for years.  Vesicles are deep-seated, not flaccid, and are most consistent with dishydrotic eczema.  She desires referral to podiatrist.  REcommended tx would include topical corticosteroid.

## 2022-04-27 NOTE — Assessment & Plan Note (Signed)
She requests a referral to resume therapy with  Dr. Theodis Shove who is now with [facility].  I provided her contact info as her insurance does not require a formal referral.

## 2022-04-27 NOTE — Assessment & Plan Note (Signed)
Lipid panel obtained at recent cardiology visit was reviewed.  She does not wish to take a medicine unless her efforts with lifestyle change (dietary primarily) are unsuccessful. She understands that lipid management is an important element in preventing her CAD (calcifications; asymptomatic) from progressing.  SHe is quite medication-averse.

## 2022-05-01 ENCOUNTER — Ambulatory Visit: Payer: Medicare Other | Admitting: Podiatry

## 2022-05-14 ENCOUNTER — Ambulatory Visit (INDEPENDENT_AMBULATORY_CARE_PROVIDER_SITE_OTHER): Payer: Medicare Other | Admitting: Podiatry

## 2022-05-14 DIAGNOSIS — L989 Disorder of the skin and subcutaneous tissue, unspecified: Secondary | ICD-10-CM | POA: Diagnosis not present

## 2022-05-14 DIAGNOSIS — B353 Tinea pedis: Secondary | ICD-10-CM

## 2022-05-14 MED ORDER — CLOTRIMAZOLE-BETAMETHASONE 1-0.05 % EX CREA
1.0000 | TOPICAL_CREAM | Freq: Two times a day (BID) | CUTANEOUS | 3 refills | Status: DC
Start: 2022-05-14 — End: 2022-09-06

## 2022-05-14 NOTE — Progress Notes (Signed)
Chief Complaint  Patient presents with   Callouses    Patient is here for nail problem on left foot 2nd toe and bilateral callous, the patient states that she breaks out with bilateral  rash.   Blister   Rash    HPI: 61 y.o. female presenting today as a new patient for evaluation of bilateral foot itching and rash formation to the toes.  She also states that she developed symptomatic calluses to the bilateral feet.  She has had previous podiatrist debride her calluses in the past with relief.  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 Verl Blalock 2/09   Reflux esophagitis    Sleep apnea    No CPAP   Vaginal discharge 03/02/2020    Past Surgical History:  Procedure Laterality Date   COLONOSCOPY     ECTOPIC PREGNANCY SURGERY     EXPLORATORY LAPAROTOMY  2000   x2 (Dr. Kendall Flack)   HERPES SIMPLEX VIRUS DFA     TUBAL LIGATION  1987    Allergies  Allergen Reactions   Other Itching and Hives    Pain medication ordered at [facility] in 2009 - Patient does not know name of medication  Pain medication given at [facility] approximately 2009 Pain medication ordered at [facility] in 2009 - Patient does not know name of medication  Pain medication given at [facility] approximately 2009     Physical Exam: General: The patient is alert and oriented x3 in no acute distress.  Dermatology: Skin is warm, dry and supple bilateral lower  extremities. Negative for open lesions or macerations.  Hyperkeratotic preulcerative callus lesions noted to the fifth MTP bilateral.  There is also some superficial peeling of skin with pruritus noted to the interdigital areas especially to the right foot  Vascular: Palpable pedal pulses bilaterally. Capillary refill within normal limits.  Negative for any significant edema or erythema  Neurological: Light touch and protective threshold grossly intact  Musculoskeletal Exam: No pedal deformities noted  Assessment: 1.  Symptomatic calluses plantar fifth MTP bilateral 2.  Tinea pedis right foot   Plan of Care:  1. Patient evaluated.  2.  Excisional debridement of the hyperkeratotic callus tissue was performed to the fifth MTP bilateral without incident or bleeding.  Patient felt significant relief 3.  Prescription for Lotrisone cream provided.  Apply 2 times daily to the feet as needed 4.  Good supportive shoes and sneakers.  Advised against going barefoot  5.  Return to clinic as needed     Edrick Kins, DPM [facility]  Dr. Edrick Kins, DPM    [address]  [place], [place] [address]                Office [phone]  Fax [phone]

## 2022-05-21 ENCOUNTER — Ambulatory Visit (INDEPENDENT_AMBULATORY_CARE_PROVIDER_SITE_OTHER): Payer: Medicare Other | Admitting: Pulmonary Disease

## 2022-05-21 ENCOUNTER — Encounter: Payer: Self-pay | Admitting: Pulmonary Disease

## 2022-05-21 ENCOUNTER — Ambulatory Visit (INDEPENDENT_AMBULATORY_CARE_PROVIDER_SITE_OTHER): Payer: Medicare Other

## 2022-05-21 VITALS — BP 126/62 | HR 64 | Temp 97.9°F | Ht 60.0 in | Wt 168.0 lb

## 2022-05-21 DIAGNOSIS — G4733 Obstructive sleep apnea (adult) (pediatric): Secondary | ICD-10-CM | POA: Diagnosis not present

## 2022-05-21 DIAGNOSIS — L940 Localized scleroderma [morphea]: Secondary | ICD-10-CM

## 2022-05-21 DIAGNOSIS — R079 Chest pain, unspecified: Secondary | ICD-10-CM

## 2022-05-21 NOTE — Patient Instructions (Signed)
We will get a chest x-ray today Follow-up in 6 months 

## 2022-05-21 NOTE — Progress Notes (Signed)
Nicole [name]    [id number]    1961-01-01  Primary Care Physician:Williams, Elaina Pattee, MD  Referring Physician: [name], MD [place],  [name] [address]  Chief complaint: Follow-up for lung pain  HPI: 61 year old with history of GERD, depression, sleep apnea Complains of throat irritation, chest tightness.  This usually occurs after she smokes marijuana.  No cough, sputum production, fevers, chills Evaluated by Dr. Redmond Baseman, ENT with no abnormalities noted.  She was advised to quit smoking. Not on inhalers at present.  She has a diagnosis of sleep apnea but intolerant of CPAP. Being evaluated for rectal bleeding and scheduled for colonoscopy.  She had a skin biopsy done by [facility] for cutaneous lesions and diagnosed with limited scleroderma, morphea.  She has been referred to rheumatology for evaluation but canceled or no showed for multiple appointments.  Pets: No pets Occupation: She did odd jobs.  Currently on disability Exposures: Reports mold at home.  No hot tub, Jacuzzi or down close or comforters Smoking history: Smokes marijuana every day and an occasional cigarette Travel history: No significant travel history Relevant family history: No significant family history of lung disease  Interim history: Had CT and PFTs earlier this year which did not show any acute abnormality She has since followed up with Dr. Benjamine Mola, rheumatology with serologies sent which are negative for scleroderma.  She is seen a different dermatologist at [facility] who did not see any evidence of morphea and was given a diagnosis of Lipodermatosclerosis of both lower extremities   She tells me that she is getting worked up by primary care for a lesion below her left clavicle that is noted on x-ray this year although I do not see any evidence of that on CT imaging.  Outpatient Encounter Medications as of 05/21/2022  Medication Sig   aspirin 81 MG  chewable tablet Chew 81 mg by mouth. 3 times a week   clotrimazole-betamethasone (LOTRISONE) cream Apply 1 Application topically 2 (two) times daily.   No facility-administered encounter medications on file as of 05/21/2022.    Physical Exam: Blood pressure 126/62, pulse 64, temperature 97.9 F (36.6 C), temperature source Oral, height 5' (1.524 m), weight 168 lb (76.2 kg), last menstrual period 03/03/2016, SpO2 100 %. Gen:      No acute distress HEENT:  EOMI, sclera anicteric Neck:     No masses; no thyromegaly Lungs:    Clear to auscultation bilaterally; normal respiratory effort CV:         Regular rate and rhythm; no murmurs Abd:      + bowel sounds; soft, non-tender; no palpable masses, no distension Ext:    No edema; adequate peripheral perfusion Skin:      Warm and dry; no rash Neuro: alert and oriented x 3 Psych: normal mood and affect   Data Reviewed: Imaging: Chest x-ray 04/25/2019-no active disease.  Chest x-ray 01/14/2020-no acute cardiopulmonary disease High-resolution CT 11/16/2021-no evidence of interstitial lung disease, bland minimal scarring bilaterally.  Coronary artery disease. I have reviewed the images personally  PFTs: 12/14/2021 FVC 3.06 [112%], FEV1 2.47 [115%], F/F 81, TLC 5.13 [98%], DLCO 17.75 [85%] Normal test  Labs: CBC 10/21/2018-WBC 4.3, eos 2.7%, absolute eosinophil count 159 CBC 04/07/2021-WBC 4.5, eos 4.2%, absolute eosinophil count 189  IgE 01/14/2020-36  Hypersensitivity panel 01/14/2020-negative  Skin biopsy 06/13/2021 Sclerotic collagen with elongated fibroblasts.  Morphea.  Assessment:  Chest discomfort, evaluation for  interstitial lung disease Atypical presentation this is likely related to her ongoing marijuana smoking.  D-dimer is negative ruling out venous thromboembolism Initially had a diagnosis of morphea but work-up has been negative for scleroderma  There is no evidence of interstitial lung disease on CT or PFTs.  Continue  monitoring We will get a chest x-ray today  Coronary atherosclerosis Noted on high-res CT.  She has followed up with cardiology  OSA Now following with [facility] neurology for management of OSA  Plan/Recommendations: Chest x-ray Return in 6 months  Marshell Garfinkel MD [facility] 05/21/2022, 8:36 AM  CC: [name], MD

## 2022-06-06 ENCOUNTER — Emergency Department (HOSPITAL_COMMUNITY)
Admission: EM | Admit: 2022-06-06 | Discharge: 2022-06-06 | Disposition: A | Discharge status: Home / Self Care | Payer: Medicare Other | Attending: Emergency Medicine | Admitting: Emergency Medicine

## 2022-06-06 ENCOUNTER — Encounter (HOSPITAL_COMMUNITY): Payer: Self-pay | Admitting: Emergency Medicine

## 2022-06-06 DIAGNOSIS — Z7982 Long term (current) use of aspirin: Secondary | ICD-10-CM | POA: Insufficient documentation

## 2022-06-06 DIAGNOSIS — R109 Unspecified abdominal pain: Secondary | ICD-10-CM | POA: Diagnosis not present

## 2022-06-06 DIAGNOSIS — M25511 Pain in right shoulder: Secondary | ICD-10-CM | POA: Diagnosis not present

## 2022-06-06 DIAGNOSIS — R1084 Generalized abdominal pain: Secondary | ICD-10-CM | POA: Diagnosis not present

## 2022-06-06 DIAGNOSIS — R141 Gas pain: Secondary | ICD-10-CM | POA: Diagnosis not present

## 2022-06-06 DIAGNOSIS — Z743 Need for continuous supervision: Secondary | ICD-10-CM | POA: Diagnosis not present

## 2022-06-06 MED ORDER — ALUM & MAG HYDROXIDE-SIMETH 200-200-20 MG/5ML PO SUSP
30.0000 mL | Freq: Once | ORAL | Status: AC
  Administered 2022-06-06: 30 mL via ORAL
  Filled 2022-06-06: qty 30

## 2022-06-06 NOTE — ED Notes (Signed)
Urine in main lab

## 2022-06-06 NOTE — ED Provider Notes (Signed)
[facility] DEPT Provider Note   CSN: [id number] Arrival date & time: 06/06/22  0158     History  Chief Complaint  Patient presents with   Abdominal Pain    Nicole [name] is a 61 y.o. female.  The history is provided by the patient.  Abdominal Pain Pain location:  Generalized Pain quality: cramping   Pain radiates to:  Does not radiate Pain severity:  Moderate Onset quality:  Sudden Duration: 15 minutes. Timing:  Rare Progression:  Resolved Chronicity:  New Context: not suspicious food intake and not trauma   Relieved by:  Flatus Worsened by:  Nothing Ineffective treatments:  None tried Associated symptoms: flatus   Associated symptoms: no anorexia, no chest pain, no constipation, no diarrhea, no dysuria, no fever, no nausea and no vomiting   Risk factors: no NSAID use   Called EMS for abdominal cramping and arrived and then went to the restroom and passed large amount of gas and pain was completely relieved.       Home Medications Prior to Admission medications   Medication Sig Start Date End Date Taking? Authorizing Provider  aspirin 81 MG chewable tablet Chew 81 mg by mouth. 3 times a week    [provider]  clotrimazole-betamethasone (LOTRISONE) cream Apply 1 Application topically 2 (two) times daily. 05/14/22   Edrick Kins, DPM      Allergies    Other    Review of Systems   Review of Systems  Constitutional:  Negative for fever.  HENT:  Negative for facial swelling.   Eyes:  Negative for redness.  Respiratory:  Negative for wheezing and stridor.   Cardiovascular:  Negative for chest pain.  Gastrointestinal:  Positive for abdominal pain and flatus. Negative for anorexia, constipation, diarrhea, nausea and vomiting.  Genitourinary:  Negative for dysuria.  All other systems reviewed and are negative.   Physical Exam Updated Vital Signs BP 111/64 (BP Location: Left Arm)   Pulse 60   Temp 98.5 F (36.9 C)  (Oral)   Resp 13   LMP 03/03/2016 (Approximate)   SpO2 96%  Physical Exam Vitals and nursing note reviewed.  Constitutional:      General: She is not in acute distress.    Appearance: Normal appearance. She is well-developed.  HENT:     Head: Normocephalic and atraumatic.     Nose: Nose normal.  Eyes:     Pupils: Pupils are equal, round, and reactive to light.  Cardiovascular:     Rate and Rhythm: Normal rate and regular rhythm.     Pulses: Normal pulses.     Heart sounds: Normal heart sounds.  Pulmonary:     Effort: Pulmonary effort is normal. No respiratory distress.     Breath sounds: Normal breath sounds.  Abdominal:     General: Bowel sounds are increased. There is no distension.     Palpations: Abdomen is soft.     Tenderness: There is no abdominal tenderness. There is no right CVA tenderness, left CVA tenderness, guarding or rebound. Negative signs include Murphy's sign, Rovsing's sign and McBurney's sign.  Genitourinary:    Vagina: No vaginal discharge.  Musculoskeletal:        General: Normal range of motion.     Cervical back: Neck supple.  Skin:    General: Skin is warm and dry.     Capillary Refill: Capillary refill takes less than 2 seconds.     Findings: No erythema or rash.  Neurological:  General: No focal deficit present.     Mental Status: She is alert and oriented to person, place, and time.     Deep Tendon Reflexes: Reflexes normal.  Psychiatric:        Mood and Affect: Mood normal.        Behavior: Behavior normal.     ED Results / Procedures / Treatments   Labs (all labs ordered are listed, but only abnormal results are displayed) Labs Reviewed - No data to display  EKG None  Radiology No results found.  Procedures Procedures    Medications Ordered in ED Medications  alum & mag hydroxide-simeth (MAALOX/MYLANTA) 200-200-20 MG/5ML suspension 30 mL (has no administration in time range)    ED Course/ Medical Decision Making/ A&P                            Medical Decision Making Patient with pain and then called 911 and came to the Ed and passed large flatus and symptoms have resolved.   Amount and/or Complexity of Data Reviewed Independent Historian: EMS    Details: See above  External Data Reviewed: notes.    Details: Previous notes reviewed   Risk OTC drugs. Risk Details: Patient states she feels pain free and would like to go home.  Exam and vitals are benign and reassuring.  Strict return precautions given.      Final Clinical Impression(s) / ED Diagnoses Return for intractable cough, coughing up blood, fevers > 100.4 unrelieved by medication, shortness of breath, intractable vomiting, chest pain, shortness of breath, weakness, numbness, changes in speech, facial asymmetry, abdominal pain, passing out, Inability to tolerate liquids or food, cough, altered mental status or any concerns. No signs of systemic illness or infection. The patient is nontoxic-appearing on exam and vital signs are within normal limits.  I have reviewed the triage vital signs and the nursing notes. Pertinent labs & imaging results that were available during my care of the patient were reviewed by me and considered in my medical decision making (see chart for details). After history, exam, and medical workup I feel the patient has been appropriately medically screened and is safe for discharge home. Pertinent diagnoses were discussed with the patient. Patient was given return precautions.    Alana Dayton, MD 06/06/22 [phone]

## 2022-06-07 ENCOUNTER — Telehealth: Payer: Self-pay

## 2022-06-07 NOTE — Patient Outreach (Signed)
  Care Coordination TOC Note Transition Care Management Follow-up Telephone Call Date of discharge and from where: [facility] 06/06/22 How have you been since you were released from the hospital? "I am feeling good now" Any questions or concerns? No  Items Reviewed: Did the pt receive and understand the discharge instructions provided? Yes  Medications obtained and verified? Yes  Other? No  Any new allergies since your discharge? No  Dietary orders reviewed? No Do you have support at home? Yes   Home Care and Equipment/Supplies: Were home health services ordered? no If so, what is the name of the agency? N/a  Has the agency set up a time to come to the patient's home? no Were any new equipment or medical supplies ordered?  No What is the name of the medical supply agency? N/A Were you able to get the supplies/equipment? not applicable Do you have any questions related to the use of the equipment or supplies? No  Functional Questionnaire: (I = Independent and D = Dependent) ADLs Not discussed with patient  Bathing/Dressing- N/A  Meal Prep- N/A  Eating- N/A  Maintaining continence- N/A  Transferring/Ambulation- N/A  Managing Meds- N/A  Follow up appointments reviewed:  PCP Hospital f/u appt confirmed? No  Specialist Hospital f/u appt confirmed? No  Are transportation arrangements needed? No  If their condition worsens, is the pt aware to call PCP or go to the Emergency Dept.? Yes Was the patient provided with contact information for the PCP's office or ED? Yes Was to pt encouraged to call back with questions or concerns? Yes  SDOH assessments and interventions completed:   Yes  Care Coordination Interventions Activated:  Yes   Care Coordination Interventions:  No Care Coordination interventions needed at this time.   Encounter Outcome:  Pt. Visit Completed

## 2022-06-07 NOTE — Patient Outreach (Signed)
  Care Coordination TOC Note Transition Care Management Unsuccessful Follow-up Telephone Call  Date of discharge and from where:  [facility] 06/06/22  Attempts:  1st Attempt  Reason for unsuccessful TCM follow-up call:  No answer/busy  Johnney Killian, RN, BSN, CCM Care Management Coordinator [facility] Phone: [phone]: [phone]

## 2022-07-09 ENCOUNTER — Ambulatory Visit: Payer: Medicare Other

## 2022-07-09 ENCOUNTER — Encounter: Payer: Medicare Other | Admitting: Internal Medicine

## 2022-07-09 NOTE — Progress Notes (Deleted)
Prediabetes: Last HbA1c January 2023 fo 5.8%. Today is .  HLD: LDL 03/2022 above goal, 150. Not currently on any lipid-lowering therapy. She previously made her wishes of avoiding starting medication very clear and wishes instead to modify lifestyle factors for control of her cholesterol. Currently she exercises* and eats*. Plan:Recheck lipid panel today.

## 2022-07-10 ENCOUNTER — Encounter: Payer: Self-pay | Admitting: *Deleted

## 2022-07-17 ENCOUNTER — Ambulatory Visit: Payer: Medicare Other | Admitting: Obstetrics and Gynecology

## 2022-07-18 ENCOUNTER — Other Ambulatory Visit: Payer: Self-pay | Admitting: Internal Medicine

## 2022-07-18 DIAGNOSIS — Z1231 Encounter for screening mammogram for malignant neoplasm of breast: Secondary | ICD-10-CM

## 2022-08-22 ENCOUNTER — Ambulatory Visit (INDEPENDENT_AMBULATORY_CARE_PROVIDER_SITE_OTHER): Payer: Medicare Other | Admitting: Obstetrics and Gynecology

## 2022-08-22 ENCOUNTER — Other Ambulatory Visit (HOSPITAL_COMMUNITY)
Admission: RE | Admit: 2022-08-22 | Discharge: 2022-08-22 | Disposition: A | Discharge status: Home / Self Care | Payer: Medicare Other | Source: Ambulatory Visit | Attending: Obstetrics and Gynecology | Admitting: Obstetrics and Gynecology

## 2022-08-22 VITALS — BP 111/74 | HR 85

## 2022-08-22 DIAGNOSIS — N898 Other specified noninflammatory disorders of vagina: Secondary | ICD-10-CM

## 2022-08-22 DIAGNOSIS — Z1330 Encounter for screening examination for mental health and behavioral disorders, unspecified: Secondary | ICD-10-CM | POA: Diagnosis not present

## 2022-08-22 NOTE — Progress Notes (Signed)
GYNECOLOGY VISIT  Patient name: Nicole [name] MRN [id number]  Date of birth: 11/28/1960 Chief Complaint:   Vaginal Discharge   History:  Nicole [name] is a 62 y.o. [phone] being seen today for pelvic discomfort.  Has been having some pain with intercourse. Partner has ED, 21 yo with viagra and doesn't always engage in foreplay prior to pentration. Doe sn othave pain outside of intercourse. Pain durin gintercourse due to dyness and sesnation of teartng. Prevoiu considered vag estrogen but did not like potential risk ofvaginal bleeding no t use moisturizeer now. Will use some lubricant but not always conssistee. 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 Verl Blalock 2/09   Reflux esophagitis    Sleep apnea    No CPAP   Vaginal discharge 03/02/2020    Past Surgical History:  Procedure Laterality Date   COLONOSCOPY     ECTOPIC PREGNANCY SURGERY     EXPLORATORY LAPAROTOMY  2000   x2 (Dr. Kendall Flack)   [facility] VIRUS DFA     [place]    The following portions of the patient's history were reviewed and updated as appropriate: allergies, current medications, past family history, past medical history, past social history, past surgical history and problem list.   Health Maintenance:   Last pap 06/2020. Results were: NILM w/ HRHPV negative. H/O abnormal pap: no Last mammogram: 08/2021.  Results were: normal.    Review of Systems:  Pertinent items are noted in HPI. Comprehensive review of systems was otherwise negative.   Objective:  Physical Exam BP 111/74   Pulse 85   LMP 03/03/2016 (Approximate)    Physical Exam Vitals and nursing note reviewed. Exam conducted with a chaperone present.  Constitutional:      Appearance: Normal appearance.  HENT:     Head: Normocephalic and atraumatic.  Pulmonary:     Effort: Pulmonary effort is normal.  Abdominal:     Palpations: Abdomen is soft.  Genitourinary:    General: Normal vulva.     Comments: Left levator more thender than right, both tender Atrophic introitus. Atorphicvl vagina lc ana No lacerations noted Neurological:     Mental Status: She is alert.  Psychiatric:        Mood and Affect: Mood normal.        Behavior: Behavior normal.        Thought Content: Thought content normal.        Judgment: Judgment normal.       Assessment & Plan:   1. Vaginal discharge Vaginal swab collected. Recommend prolonged foreplay prior to penetration. Can use OTC vaginal moisturizer in addition to lubricant for penetrative intercourse. Discussed importance of discussing needs and desires with partner. Does not want to use vaginal estrogen but willing to try coconut oil,  OTC moisturizer and lubricant.  - Cervicovaginal ancillary only( [place])    Routine preventative health maintenance measures emphasized.  Darliss Cheney, MD [facility]

## 2022-08-24 LAB — CERVICOVAGINAL ANCILLARY ONLY
Bacterial Vaginitis (gardnerella): NEGATIVE
Candida Glabrata: NEGATIVE
Candida Vaginitis: POSITIVE — AB
Comment: NEGATIVE
Comment: NEGATIVE
Comment: NEGATIVE

## 2022-08-27 ENCOUNTER — Other Ambulatory Visit: Payer: Self-pay | Admitting: Obstetrics and Gynecology

## 2022-08-27 DIAGNOSIS — B3731 Acute candidiasis of vulva and vagina: Secondary | ICD-10-CM

## 2022-08-27 MED ORDER — FLUCONAZOLE 150 MG PO TABS: 150.0000 mg | ORAL_TABLET | Freq: Once | ORAL | 3 refills | Status: AC

## 2022-09-06 ENCOUNTER — Ambulatory Visit (INDEPENDENT_AMBULATORY_CARE_PROVIDER_SITE_OTHER): Payer: 59

## 2022-09-06 ENCOUNTER — Ambulatory Visit (INDEPENDENT_AMBULATORY_CARE_PROVIDER_SITE_OTHER): Payer: 59 | Admitting: Internal Medicine

## 2022-09-06 ENCOUNTER — Encounter: Payer: Self-pay | Admitting: Internal Medicine

## 2022-09-06 VITALS — BP 131/72 | HR 85 | Temp 98.3°F | Ht 60.0 in | Wt 169.2 lb

## 2022-09-06 DIAGNOSIS — E785 Hyperlipidemia, unspecified: Secondary | ICD-10-CM | POA: Diagnosis not present

## 2022-09-06 DIAGNOSIS — Z0000 Encounter for general adult medical examination without abnormal findings: Secondary | ICD-10-CM

## 2022-09-06 DIAGNOSIS — R1310 Dysphagia, unspecified: Secondary | ICD-10-CM | POA: Diagnosis not present

## 2022-09-06 DIAGNOSIS — I959 Hypotension, unspecified: Secondary | ICD-10-CM | POA: Insufficient documentation

## 2022-09-06 DIAGNOSIS — I2510 Atherosclerotic heart disease of native coronary artery without angina pectoris: Secondary | ICD-10-CM | POA: Diagnosis not present

## 2022-09-06 DIAGNOSIS — R238 Other skin changes: Secondary | ICD-10-CM

## 2022-09-06 DIAGNOSIS — M19012 Primary osteoarthritis, left shoulder: Secondary | ICD-10-CM | POA: Diagnosis not present

## 2022-09-06 DIAGNOSIS — G4733 Obstructive sleep apnea (adult) (pediatric): Secondary | ICD-10-CM | POA: Diagnosis not present

## 2022-09-06 DIAGNOSIS — Z87891 Personal history of nicotine dependence: Secondary | ICD-10-CM

## 2022-09-06 DIAGNOSIS — R7303 Prediabetes: Secondary | ICD-10-CM

## 2022-09-06 DIAGNOSIS — E7841 Elevated Lipoprotein(a): Secondary | ICD-10-CM | POA: Diagnosis not present

## 2022-09-06 DIAGNOSIS — Z8639 Personal history of other endocrine, nutritional and metabolic disease: Secondary | ICD-10-CM

## 2022-09-06 LAB — GLUCOSE, CAPILLARY: Glucose-Capillary: 93 mg/dL (ref 70–99)

## 2022-09-06 LAB — POCT GLYCOSYLATED HEMOGLOBIN (HGB A1C): Hemoglobin A1C: 5.9 % — AB (ref 4.0–5.6)

## 2022-09-06 MED ORDER — ASPIRIN 81 MG PO CHEW: 81.0000 mg | CHEWABLE_TABLET | Freq: Every day | ORAL | 3 refills | Status: DC

## 2022-09-06 NOTE — Assessment & Plan Note (Addendum)
Fasting today for recheck of her lifestyle and dietary changes, her preference to a medication.  Very high LDL will require a statin.  She remains reticent, but I'll call her with today's results and further discuss.  She follows internet celebrities and alternative medicine sites advising certain foods to "suck up the cholesterol".  Will change from veg oil to olive oil, and agrees to try leaner ground beef. Continues to enjoy mayonnaise and cheese but it trying to be mindful.

## 2022-09-06 NOTE — Progress Notes (Signed)
Subjective:   Nicole [name] is a 62 y.o. female who presents for Medicare Annual (Subsequent) preventive examination. I connected with  Kendle Turbin Wires on 09/06/22 by a  Face-To-Face  enabled telemedicine application and verified that I am speaking with the correct person using two identifiers.  Patient Location: Other:  Office/Clinic  Provider Location: Office/Clinic  I discussed the limitations of evaluation and management by telemedicine. The patient expressed understanding and agreed to proceed.  Review of Systems    Defer to PCP       Objective:    Today's Vitals   09/06/22 1126 09/06/22 1127  BP: 131/72   Pulse: 85   Temp: 98.3 F (36.8 C)   TempSrc: Oral   SpO2: 100%   Weight: 169 lb 3.2 oz (76.7 kg)   Height: 5' (1.524 m)   PainSc:  8    Body mass index is 33.04 kg/m.     09/06/2022   11:27 AM 09/06/2022    9:11 AM 04/26/2022    9:48 AM 01/18/2022    9:08 AM 12/15/2021    1:36 PM 09/01/2021    9:51 AM 07/11/2021    3:30 PM  Advanced Directives  Does Patient Have a Medical Advance Directive? No No No No No No No  Would patient like information on creating a medical advance directive? No - Patient declined No - Patient declined No - Patient declined No - Patient declined No - Patient declined No - Patient declined No - Patient declined    Current Medications (verified) Outpatient Encounter Medications as of 09/06/2022  Medication Sig   aspirin 81 MG chewable tablet Chew 1 tablet (81 mg total) by mouth daily. 3 times a week   No facility-administered encounter medications on file as of 09/06/2022.    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 Verl Blalock 2/09   Reflux esophagitis    Sleep apnea    No CPAP   Vaginal discharge 03/02/2020   Past Surgical History:  Procedure Laterality Date   COLONOSCOPY     ECTOPIC PREGNANCY SURGERY     EXPLORATORY LAPAROTOMY  2000   x2 (Dr. Kendall Flack)   HERPES SIMPLEX VIRUS DFA     TUBAL LIGATION  1987   Family History  Problem Relation Age of Onset   Hypertension Mother    Diabetes Mother    Heart disease Mother    Thyroid disease Mother    Glaucoma Mother    Diabetes Father    Hypertension Sister    Diabetes Sister    Diabetes Sister    Hypertension Sister    Sleep apnea Sister    Hypertension Sister    Diabetes Sister    Hypertension Brother    Diabetes Brother    Pancreatic cancer Maternal Uncle    Heart attack Maternal Grandmother    Heart attack Maternal Grandfather  Mental illness Son    Mental illness Son    Pancreatic cancer Other        family history   Colon cancer Neg Hx    Social History   Socioeconomic History   Marital status: Single    Spouse name: Not on file   Number of children: 3   Years of education: Not on file   Highest education level: Not on file  Occupational History   Not on file  Tobacco Use   Smoking status: Former    Packs/day: 0.25    Years: 36.00    Total pack years: 9.00    Types: Cigarettes    Quit date: 05/18/2022    Years since quitting: 0.3   Smokeless tobacco: Never  Vaping Use   Vaping Use: Never used  Substance and Sexual Activity   Alcohol use: Yes    Alcohol/week: 0.0 standard drinks of alcohol    Comment: Rare glass of wine   Drug use: Yes    Types: Marijuana    Comment: daily, smokes it   Sexual activity: Never  Other Topics Concern   Not on file  Social History Narrative   Current Social History 02/16/2021         Patient lives alone in a [place] which is 2 stories. There are 13 steps with handrails up to the entrance the patient uses.       Patient's method of transportation is city bus.      The highest level of education was high school diploma.      The patient currently disabled.      Identified important Relationships are "My mother, my sisters, children, grandchildren (60)"       Pets : None       Interests / Fun: "Read, watch TV, movies, social media, talk on phone, shopping, travel"       Current Stressors: "Simple people get on my nerves,;drama."       Religious / Personal Beliefs: "Christian, [place], Pentecostal."       Other: "I stay away from people."      L. Ducatte, BSN, RN-BC              Social Determinants of Health   Financial Resource Strain: Medium Risk (09/06/2022)   Overall Financial Resource Strain (CARDIA)    Difficulty of Paying Living Expenses: Somewhat hard  Food Insecurity: Food Insecurity Present (09/06/2022)   Hunger Vital Sign    Worried About Running Out of Food in the Last Year: Sometimes true    Ran Out of Food in the Last Year: Sometimes true  Transportation Needs: Unmet Transportation Needs (09/06/2022)   PRAPARE - Hydrologist (Medical): Yes    Lack of Transportation (Non-Medical): Yes  Physical Activity: Insufficiently Active (09/06/2022)   Exercise Vital Sign    Days of Exercise per Week: 3 days    Minutes of Exercise per Session: 40 min  Stress: No Stress Concern Present (09/06/2022)   [place]    Feeling of Stress : Only a little  Social Connections: Moderately Isolated (09/06/2022)   Social Connection and Isolation Panel [NHANES]    Frequency of Communication with Friends and Family: More than three times a week    Frequency of Social Gatherings with Friends and Family: More than three times a week    Attends Religious Services: 1 to 4 times per  year    Active  Member of Clubs or Organizations: No    Attends Music therapist: Never    Marital Status: Divorced    Tobacco Counseling Counseling given: Not Answered   Clinical Intake:  Pre-visit preparation completed: Yes  Pain : 0-10 Pain Score: 8  Pain Type: Acute pain Pain Location: Calf Pain Onset: More than a month ago Pain Frequency: Intermittent     Nutritional Risks: None Diabetes: No  How often do you need to have someone help you when you read instructions, pamphlets, or other written materials from your doctor or pharmacy?: 1 - Never What is the last grade level you completed in school?: 12th grade  Diabetic?No  Interpreter Needed?: No  Information entered by :: Corey Skains Aneli Zara,cma 09/06/22 11:27am   Activities of Daily Living    09/06/2022   11:28 AM 09/06/2022    9:11 AM  In your present state of health, do you have any difficulty performing the following activities:  Hearing? 0 0  Vision? 0 0  Difficulty concentrating or making decisions? 0 0  Walking or climbing stairs? 0 0  Dressing or bathing? 0 0  Doing errands, shopping? 0 0    Patient Care Team: [name], MD as PCP - General (Internal Medicine) Minus Breeding, MD as PCP - Cardiology (Cardiology) Aletha Halim, MD as Consulting Physician (Obstetrics and Gynecology) Marshell Garfinkel, MD as Consulting Physician (Pulmonary Disease) Gatha Mayer, MD as Consulting Physician (Gastroenterology) Leta Baptist, Earlean Polka, MD as Consulting Physician (Neurology) [facility]  Indicate any recent Medical Services you may have received from other than [facility] providers in the past year (date may be approximate).     Assessment:   This is a routine wellness examination for Aubreigh.  Hearing/Vision screen No results found.  Dietary issues and exercise activities discussed:     Goals Addressed   None   Depression Screen    09/06/2022   11:28 AM  08/22/2022    4:23 PM 04/26/2022    9:49 AM 01/18/2022   10:26 AM 09/01/2021   10:58 AM 08/25/2021   10:59 AM 07/11/2021    3:30 PM  PHQ 2/9 Scores  PHQ - 2 Score 1 1 0 0 0 1   PHQ- 9 Score '4 4    6   '$ Exception Documentation       Patient refusal    Fall Risk    09/06/2022   11:28 AM 09/06/2022    9:10 AM 04/26/2022    9:49 AM 01/18/2022    9:09 AM 09/01/2021    9:52 AM  Fall Risk   Falls in the past year? 0 0 0 1 1  Number falls in past yr: 0 0 0 0 1  Injury with Fall? 0 0 0 1 1  Risk for fall due to : No Fall Risks   Impaired balance/gait Impaired balance/gait  Follow up Falls evaluation completed;Falls prevention discussed Falls evaluation completed Falls evaluation completed Falls evaluation completed Falls evaluation completed    FALL RISK PREVENTION PERTAINING TO THE HOME:  Any stairs in or around the home? Yes  If so, are there any without handrails? No  Home free of loose throw rugs in walkways, pet beds, electrical cords, etc? Yes  Adequate lighting in your home to reduce risk of falls? Yes   ASSISTIVE DEVICES UTILIZED TO PREVENT FALLS:  Life alert? No  Use of a cane, walker or w/c? No  Grab bars in the bathroom? No  Shower chair or bench  in shower? No  Elevated toilet seat or a handicapped toilet? No   TIMED UP AND GO:  Was the test performed? Yes .  Length of time to ambulate 10 feet: 1 min .   Gait slow and steady without use of assistive device  Cognitive Function:        09/06/2022   11:28 AM  6CIT Screen  What Year? 0 points  What month? 0 points  What time? 0 points  Count back from 20 0 points  Months in reverse 0 points  Repeat phrase 0 points  Total Score 0 points    Immunizations Immunization History  Administered Date(s) Administered   Td 10/18/2005   Tdap 10/27/2012    TDAP status: Up to date  Flu Vaccine status: Up to date    Covid-19 vaccine status: Information provided on how to obtain vaccines.   Qualifies for Shingles Vaccine?  No   Zostavax completed No   Shingrix Completed?: No.    Education has been provided regarding the importance of this vaccine. Patient has been advised to call insurance company to determine out of pocket expense if they have not yet received this vaccine. Advised may also receive vaccine at local pharmacy or Health Dept. Verbalized acceptance and understanding.  Screening Tests Health Maintenance  Topic Date Due   COVID-19 Vaccine (1) Never done   Zoster Vaccines- Shingrix (1 of 2) Never done   DTaP/Tdap/Td (3 - Td or Tdap) 10/28/2022   MAMMOGRAM  08/25/2023   Medicare Annual Wellness (AWV)  09/07/2023   PAP SMEAR-Modifier  06/24/2025   COLONOSCOPY (Pts 45-33yr Insurance coverage will need to be confirmed)  01/19/2027   Hepatitis C Screening  Completed   HIV Screening  Completed   HPV VACCINES  Aged Out   INFLUENZA VACCINE  Discontinued    Health Maintenance  Health Maintenance Due  Topic Date Due   COVID-19 Vaccine (1) Never done   Zoster Vaccines- Shingrix (1 of 2) Never done    Colorectal cancer screening: Type of screening: Colonoscopy. Completed [id number] Repeat every 7 years  Mammogram status: Completed 08/24/2021. Repeat every year:Scheduled for 09/13/2022   Lung Cancer Screening: (Low Dose CT Chest recommended if Age 64-80years, 30 pack-year currently smoking OR have quit w/in 15years.) does not qualify.   Lung Cancer Screening Referral: N/A  Additional Screening:  Hepatitis C Screening: does not qualify; Completed 08/25/2021  Vision Screening: Recommended annual ophthalmology exams for early detection of glaucoma and other disorders of the eye. Is the patient up to date with their annual eye exam?  Yes  Who is the provider or what is the name of the office in which the patient attends annual eye exams? N/A If pt is not established with a provider, would they like to be referred to a provider to establish care? Yes .   Dental Screening: Recommended annual dental  exams for proper oral hygiene  Community Resource Referral / Chronic Care Management: CRR required this visit?  No   CCM required this visit?  No      Plan:     I have personally reviewed and noted the following in the patient's chart:   Medical and social history Use of alcohol, tobacco or illicit drugs  Current medications and supplements including opioid prescriptions. Patient is not currently taking opioid prescriptions. Functional ability and status Nutritional status Physical activity Advanced directives List of other physicians Hospitalizations, surgeries, and ER visits in previous 12 months Vitals Screenings to include cognitive,  depression, and falls Referrals and appointments  In addition, I have reviewed and discussed with patient certain preventive protocols, quality metrics, and best practice recommendations. A written personalized care plan for preventive services as well as general preventive health recommendations were provided to patient.     Kerin Perna, [facility]   09/06/2022   Nurse Notes: Face-To-Face Visit  Ms. Holzschuh , Thank you for taking time to come for your Medicare Wellness Visit. I appreciate your ongoing commitment to your health goals. Please review the following plan we discussed and let me know if I can assist you in the future.   These are the goals we discussed:  Goals       Exercise 3-4x per week (20-30 min per time) (pt-stated)      Goes to [employer] with Silver Sneakers  "Shoots ball, cardio, resistance"      Weight (lb) < 164 lb (74.4 kg) (pt-stated)      7% weight loss goal        This is a list of the screening recommended for you and due dates:  Health Maintenance  Topic Date Due   COVID-19 Vaccine (1) Never done   Zoster (Shingles) Vaccine (1 of 2) Never done   DTaP/Tdap/Td vaccine (3 - Td or Tdap) 10/28/2022   Mammogram  08/25/2023   Medicare Annual Wellness Visit  09/07/2023   Pap Smear  06/24/2025   Colon Cancer Screening   01/19/2027   Hepatitis C Screening: USPSTF Recommendation to screen - Ages 18-79 yo.  Completed   HIV Screening  Completed   HPV Vaccine  Aged Out   Flu Shot  Discontinued

## 2022-09-06 NOTE — Assessment & Plan Note (Signed)
131/72 today; no hx of hypertension, takes no meds which would influence BP.  "I eat salt every day to keep my BP [place], just the [facility] salt, not the iodine salt, but I sprinkle iodine on my meat to get the pus and hormones out of it before I eat it."  ?

## 2022-09-06 NOTE — Assessment & Plan Note (Signed)
She is concerned about fluctuating tenderness and swelling of the joint which she had incorrectly ascribed to a "mass", which she thought she recalled from a former visit.  A review of all office notes and imaging occurred with her today.  Reassured her that these joints can develop degeneration/arthritis similar to other joints, and that she doesn't have a mass.  On exam, her L [place] joint is more prominent than the R, with very minimal tenderness.  She is able to move her shoulder and breath without difficulty. Monitor.

## 2022-09-06 NOTE — Progress Notes (Signed)
62 year old Ms. Lein is here for routine follow-up (approx q 6 months) for prediabetes, hyperlipidemia, and a variety of other conditions for which she seeks specialist care.  Since our last visit, she completed a stress test with cardiology, a sleep study ordered by neurology (neg for OSA); a podiatry referral to address her lost toenail and chronic bumps on her feet (she was dissatisfied and plans to follow-up with a different practice), a pulmonary appointment for "lung pain", an ED visit for gas pain, and GYN appt for candidal vulvovaginitis.    Given signs of CAD on imaging (calcifications) we spoke at last visit about the need to treat her very high cholesterol.  She prefers natural remedies to medications and wished to try lifestyle/dietary changes first.  Plan was to recheck today.  She is fasting.  Hasn't been eating as well as she had intended.  Her greatest concern is for a mass she was told that she had behind her L collarbone-an area that bothers her from time to time, with swelling and tenderness.  She recalls that she was told by a cancer doctor here in 2018 that she had a mass, and is concerned about cancer.  Notes were reviewed-in 2018, she indeed was seen here in the [facility] by our resident physician who was precepted by Dr. Beryle Beams, a trained hematologist/oncologist working in our general medical clinic at the time.  Imaging including plain films and chest CT were ordered which identified degenerative changes in the left greater than right sternoclavicular joint with no other abnormalities.  Topical NSAID was suggested for arthritis in this area.  Other symptoms elicited on ROS are described in problem based documentation below.  Senses her blocked blood vessel painful, massages it, she can touch the blockages. Feels this every day.  Feet fungus - she will order something to fight the dangerous fungus which lives in her body.    Eats pink himalayan salt to keep her BP up.  Puts iodone on  her meat to get pus and hormones out of her meat. Eats mayonaise, cheeze.    BP 131/72 (BP Location: Right Arm, Patient Position: Sitting, Cuff Size: Small)   Pulse 85   Temp 98.3 F (36.8 C) (Oral)   Ht 5' (1.524 m)   Wt 169 lb 3.2 oz (76.7 kg)   LMP 03/03/2016 (Approximate)   SpO2 100%   BMI 33.04 kg/m  Pertinent exam findings noted under appropriate problem.  Assessment and plan: (not in any particular order)  Coronary artery calcification seen on CT scan Cardiology visits: ETT negative 03/2022, no further testing recommended by Dr. Percival Spanish.  She has not been taking baby aspirin (started years ago by a former provider).  When asked if she is having chest discomfort, she states "yes, it feels like a pinch, kind of dull, and I can just touch the blocked artery of my heart, I can feel it, and I massage it until it goes away".  She can "see and feel" the blockages.  Reports that this discomfort happens mostly when she is active, less frequently when still.  Low likelihood this represents angina.  We will focus on risk factor management, which at this time includes only hyperlipidemia.  OSA (obstructive sleep apnea) Sleep study revealed no OSA, no need for CPAP therapy.  Will resolve problem.  Hx of dysphagia, resolved; EGD negative 2023. No symptoms reported at this time.  Will resolve problem.   Prediabetes Recheck today and q6-12 months.  She is concerned A1c will  be elevated due to dietary indiscretions.    Chronic lateral foot blisters Referred to podiatry; dx tinea pedis, though no mention made of the lateral foot deep-seated blisters (had appearance of dishydrotic eczema).  Feet examined:  no blisters at this time, though she does have scattered areas of superficial scaling and peeling c/w tinea.  If blisters recur would treat with medium potency steroid.    Hyperlipidemia Fasting today for recheck of her lifestyle and dietary changes, her preference to a medication.  Very high  LDL will require a statin.  She remains reticent, but I'll call her with today's results and further discuss.  She follows internet celebrities and alternative medicine sites advising certain foods to "suck up the cholesterol".  Will change from veg oil to olive oil, and agrees to try leaner ground beef. Continues to enjoy mayonnaise and cheese but it trying to be mindful.  Arthritis of left sternoclavicular joint, recurrent She is concerned about fluctuating tenderness and swelling of the joint which she had incorrectly ascribed to a "mass", which she thought she recalled from a former visit.  A review of all office notes and imaging occurred with her today.  Reassured her that these joints can develop degeneration/arthritis similar to other joints, and that she doesn't have a mass.  On exam, her L [place] joint is more prominent than the R, with very minimal tenderness.  She is able to move her shoulder and breath without difficulty. Monitor.    Hypotension, self reported 131/72 today; no hx of hypertension, takes no meds which would influence BP.  "I eat salt every day to keep my BP [place], just the [facility] salt, not the iodine salt, but I sprinkle iodine on my meat to get the pus and hormones out of it before I eat it.".........?  AWV completed today

## 2022-09-06 NOTE — Assessment & Plan Note (Signed)
Recheck today and q6-12 months.  She is concerned A1c will be elevated due to dietary indiscretions.

## 2022-09-06 NOTE — Assessment & Plan Note (Signed)
No symptoms reported at this time.  Will resolve problem.

## 2022-09-06 NOTE — Assessment & Plan Note (Signed)
Sleep study revealed no OSA, no need for CPAP therapy.  Will resolve problem.

## 2022-09-06 NOTE — Assessment & Plan Note (Addendum)
Referred to podiatry; dx tinea pedis, though no mention made of the lateral foot deep-seated blisters (had appearance of dishydrotic eczema).  Feet examined:  no blisters at this time, though she does have scattered areas of superficial scaling and peeling c/w tinea.  If blisters recur would treat with medium potency steroid.

## 2022-09-06 NOTE — Assessment & Plan Note (Signed)
Cardiology visits: ETT negative/2023, no further testing recommended by Dr. Percival Spanish.  She has not been taking baby aspirin (started years ago by a former provider).  When asked if she is having chest discomfort, she states "yes, it feels like a pinch, kind of dull, and I can just touch the blocked artery of my heart, I can feel it, and I massage it until it goes away".  She can "see and feel" the blockages.  Reports that this discomfort happens mostly when she is active, less frequently when still.  Low likelihood this represents angina.  We will focus on risk factor management, which at this time includes only hyperlipidemia.

## 2022-09-07 LAB — LIPID PANEL
Chol/HDL Ratio: 3.8 ratio (ref 0.0–4.4)
Cholesterol, Total: 236 mg/dL — ABNORMAL HIGH (ref 100–199)
HDL: 62 mg/dL (ref >39)
LDL Chol Calc (NIH): 163 mg/dL — ABNORMAL HIGH (ref 0–99)
Triglycerides: 67 mg/dL (ref 0–149)
VLDL Cholesterol Cal: 11 mg/dL (ref 5–40)

## 2022-09-07 NOTE — Progress Notes (Signed)
Called patient with results.  She is adamant about keeping with dietary modification before starting medicine.  We will recheck in 11M.

## 2022-09-10 NOTE — Progress Notes (Deleted)
Office Visit Note  Patient: Nicole [name]             Date of Birth: 1960/10/02           MRN: [id number]             PCP: [name], MD Referring: [name], MD Visit Date: 09/11/2022   Subjective:  No chief complaint on file.   History of Present Illness: Nicole [name] is a 62 y.o. female here for follow up ***   Previous HPI 01/01/22  Nicole [name] is a 62 y.o. female here for morphea evaluation for possible systemic sclerosis. She has been diagnosed by dermatology biopsy of affected area on her leg. She has skin rashes in multiple areas she estimates for about 20 years in total not sure about when these have progressed to become more noticeable.  Is associated with loss of hair as well worst in bilateral temporal areas and in her distal legs.  She has a few hyperpigmented skin patches and some areas of thickening or indentations.  Some hypopigmented areas as well but smaller spots and this is also been a scattered distribution including her torso.  She has noticed tightness or catching and tendons on the top of her feet that comes and goes feels like this takes several minutes until release and get back to normal range of movement.  She notices some swelling intermittently in her hands worse when walking and standing for prolonged time.  She saw pulmonology for assessment with no significant CT abnormalities. GI workup is planned for evaluating esophageal dysmotility. Concern is also for possible esophageal stricture or scarring related to chronic GERD.     No Rheumatology ROS completed.   PMFS History:  Patient Active Problem List   Diagnosis Date Noted   Arthritis of left sternoclavicular joint 09/06/2022   Hypotension, self reported 09/06/2022   Chronic lateral foot blisters 04/26/2022   Nonintractable migraine, unspecified migraine type 04/25/2022   Prediabetes 04/25/2022   Hx of dysphagia, resolved; EGD negative 2023. 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 Verl Blalock 2/09   Reflux esophagitis    Sleep apnea    No CPAP   Vaginal discharge 03/02/2020    Family History  Problem Relation Age of Onset   Hypertension Mother  Diabetes Mother    Heart disease Mother    Thyroid disease Mother    Glaucoma Mother    Diabetes Father    Hypertension Sister    Diabetes Sister    Diabetes Sister    Hypertension Sister    Sleep apnea Sister    Hypertension Sister    Diabetes Sister    Hypertension Brother    Diabetes Brother    Pancreatic cancer Maternal Uncle    Heart attack Maternal Grandmother    Heart attack Maternal Grandfather    Mental illness Son    Mental illness Son    Pancreatic cancer Other        family history   Colon cancer Neg Hx    Past Surgical History:  Procedure Laterality Date   COLONOSCOPY     ECTOPIC PREGNANCY  SURGERY     EXPLORATORY LAPAROTOMY  2000   x2 (Dr. Kendall Flack)   [place] VIRUS DFA     TUBAL [place]   Social History   Social History Narrative   Current Social History 02/16/2021        Patient lives alone in a [place] which is 2 stories. There are 13 steps with handrails up to the entrance the patient uses.       Patient's method of transportation is city bus.      The highest level of education was high school diploma.      The patient currently disabled.      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 Go to the Rheumatology activity and complete the homunculus joint exam.  Investigation: No additional findings.  Imaging: No results found.  Recent Labs: Lab Results  Component Value Date   WBC 4.5 04/07/2021   HGB 12.6 04/07/2021   PLT 222.0 04/07/2021   NA 139 04/07/2021   K 3.9 04/07/2021   CL 104 04/07/2021   CO2 25 04/07/2021   GLUCOSE 78 04/07/2021   BUN 13 04/07/2021   CREATININE 0.86 04/07/2021   BILITOT 0.6 04/07/2021   ALKPHOS 65 04/07/2021   AST 15 04/07/2021   ALT 16 04/07/2021   PROT 7.3 04/07/2021   ALBUMIN 4.6  04/07/2021   CALCIUM 9.4 04/07/2021   GFRAA 85 09/25/2019   QFTBGOLD Negative 03/14/2017    Speciality Comments: No specialty comments available.  Procedures:  No procedures performed Allergies: Other   Assessment / Plan:     Visit Diagnoses: No diagnosis found.  ***  Orders: No orders of the  defined types were placed in this encounter.  No orders of the defined types were placed in this encounter.    Follow-Up Instructions: No follow-ups on file.   Collier Salina, MD  Note - This record has been created using [employer].  Chart creation errors have been sought, but may not always  have been located. Such creation errors do not reflect on  the standard of medical care.

## 2022-09-11 ENCOUNTER — Ambulatory Visit: Payer: 59 | Admitting: Internal Medicine

## 2022-09-11 ENCOUNTER — Ambulatory Visit: Payer: Medicare Other | Admitting: Obstetrics and Gynecology

## 2022-09-13 ENCOUNTER — Ambulatory Visit: Payer: Medicare Other

## 2022-09-20 DIAGNOSIS — L821 Other seborrheic keratosis: Secondary | ICD-10-CM | POA: Insufficient documentation

## 2022-09-20 DIAGNOSIS — M793 Panniculitis, unspecified: Secondary | ICD-10-CM | POA: Diagnosis not present

## 2022-09-20 DIAGNOSIS — L658 Other specified nonscarring hair loss: Secondary | ICD-10-CM | POA: Insufficient documentation

## 2022-09-25 ENCOUNTER — Ambulatory Visit (INDEPENDENT_AMBULATORY_CARE_PROVIDER_SITE_OTHER): Payer: 59 | Admitting: Student

## 2022-09-25 ENCOUNTER — Encounter: Payer: Self-pay | Admitting: Student

## 2022-09-25 ENCOUNTER — Telehealth: Payer: Self-pay

## 2022-09-25 ENCOUNTER — Encounter: Payer: 59 | Admitting: Student

## 2022-09-25 VITALS — BP 134/77 | HR 84 | Temp 98.5°F | Wt 171.4 lb

## 2022-09-25 DIAGNOSIS — Z8619 Personal history of other infectious and parasitic diseases: Secondary | ICD-10-CM | POA: Diagnosis not present

## 2022-09-25 DIAGNOSIS — K649 Unspecified hemorrhoids: Secondary | ICD-10-CM | POA: Diagnosis not present

## 2022-09-25 DIAGNOSIS — I959 Hypotension, unspecified: Secondary | ICD-10-CM

## 2022-09-25 MED ORDER — HYDROCORTISONE ACETATE 25 MG RE SUPP: 25.0000 mg | Freq: Two times a day (BID) | RECTAL | 0 refills | Status: DC | PRN

## 2022-09-25 NOTE — Assessment & Plan Note (Signed)
BP 134/77 today, not on any medications.

## 2022-09-25 NOTE — Telephone Encounter (Signed)
Incoming fax from pharmacy: [facility] change request  Drug not covered by patient plan please send alternative medication.

## 2022-09-25 NOTE — Assessment & Plan Note (Addendum)
Noted on DRE. It is possible that hemorrhoids could be resulting in perianal pruritus. Prescribed hydrocortisone suppository prn to help with inflammation.

## 2022-09-25 NOTE — Progress Notes (Signed)
   CC: concern for pinworm infection  HPI:  [name] is a 62 y.o. female with history listed below presenting to the [facility] for concern for pinworm infection. Please see individualized problem based charting for full HPI.  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 01/18/2022   Sensation of food items getting stuck in upper esophagus to be evaluated first by esophagram and then by EGD with Dr. Carlean Purl over the next few weeks.   Hypercholesterolemia    Hypotension    Morphea 2022   Obesity    OSA (obstructive sleep apnea) 12/27/2015   Palpitations    negative cardiac w/u per Dr. Verl Blalock 2/09   Reflux esophagitis    Sleep apnea    No CPAP   Vaginal discharge 03/02/2020    Review of Systems:  Negative aside from that listed in individualized problem based charting.  Physical Exam:  Vitals:   09/25/22 1435  BP: 134/77  Pulse: 84  Temp: 98.5 F (36.9 C)  TempSrc: Oral  SpO2: 100%  Weight: 171 lb 6.4 oz (77.7 kg)   Physical Exam Constitutional:      Appearance: Normal appearance. She is obese. She is not ill-appearing.  HENT:     Mouth/Throat:     Mouth: Mucous membranes are moist.     Pharynx: Oropharynx is clear.  Eyes:     Extraocular Movements: Extraocular movements intact.     Conjunctiva/sclera: Conjunctivae normal.     Pupils: Pupils are equal, round, and reactive to light.  Cardiovascular:     Rate and Rhythm: Normal rate and regular rhythm.     Heart sounds: Normal heart sounds. No murmur heard.    No gallop.  Pulmonary:     Effort: Pulmonary effort  is normal.     Breath sounds: Normal breath sounds. No wheezing.  Abdominal:     General: Bowel sounds are normal. There is no distension.     Palpations: Abdomen is soft.     Tenderness: There is no abdominal tenderness.  Genitourinary:    Comments: DRE: no external lesions or visible organisms in the gluteal cleft. Hemorrhoids palpated on exam. Otherwise normal DRE. Skin:    General: Skin is warm and dry.  Neurological:     General: No focal deficit present.     Mental Status: She is alert and oriented to person, place, and time.  Psychiatric:        Mood and Affect: Mood normal.        Behavior: Behavior normal.      Assessment & Plan:   See Encounters Tab for problem based charting.  Patient discussed with Dr.  [name]

## 2022-09-25 NOTE — Assessment & Plan Note (Signed)
Patient reporting history of pinworm infections when younger. States that she had a large, loose bowel movement this past Saturday and noted a white-colored straight organism in the toilet. She went to get her glasses and when she came back, the white-colored organism was in a "U" shape. She then saw it trying to crawl to the side of the toilet bowl and so she flushed it. She notes other occasions where she experiences perianal pruritus, especially at night time, and feels something crawling from her anus to her vagina. She pushes down on the organisms to kill them, but does not look to see if they are worms because she is scared to look at them.  Given her concern, we discussed how scary this can be. We agreed to perform a DRE to visualize her gluteal cleft and ensure there are no visible organisms. DRE revealed hemorrhoids but otherwise was normal. We discussed trial of hydrocortisone suppositories to see if her perianal pruritus improves with this. Provided a specimen collection cup so that if she notes any perianal pinworms in the future, she can bring them to our clinic for inspection (mainly for her peace of mind).   Plan: -hydrocortisone suppositories daily prn -specimen collection container

## 2022-09-25 NOTE — Patient Instructions (Signed)
Nicole [name],  It was a pleasure seeing you in the clinic today.   I have prescribed a suppository to help with the inflammation from your hemorrhoids. Please use this as needed if you experience itching around your anus. We have provided a collection cup. If you see a worm around your anus or coming out of your anus, please collect it in the cup and bring it in for evaluation. Please come back to see [place] in 1 month.  Please call our clinic at [phone] if you have any questions or concerns. The best time to call is Monday-Friday from 9am-4pm, but there is someone available 24/7 at the same number. If you need medication refills, please notify your pharmacy one week in advance and they will send [place] a request.   Thank you for letting us take part in your care. We look forward to seeing you next time!

## 2022-10-01 NOTE — Progress Notes (Unsigned)
Cardiology Office Note   Date:  10/02/2022   ID:  Nicole [name], DOB 1960-09-06, MRN [id number]  PCP:  [name], MD  Cardiologist:   Minus Breeding, MD Referring:  [name], MD  Chief Complaint  Patient presents with   Precordial chest pain     History of Present Illness: Nicole [name] is a 62 y.o. female who presents for evaluation of chest pain.  I saw her last in 2018 for evaluation of chest pain.  She had a negative POET (Plain Old Exercise Treadmill).    She had an echocardiogram 2009 which was essentially normal.    She was having palpitations and she was to wear a monitor but she did not wear this.    Since I saw her she continues to have chest discomfort.  She reports that this happens sporadically.  She has not been exercising.  She has been a "couch potato".  She reports that she gets this discomfort and the sternum more below.  It is left-sided.  She is not describing radiation or associated symptoms.  She is very worried that this has to do with her coronary disease.  She does still continue to get some palpitations.  She is not having any presyncope or syncope.  She had no weight gain or edema.   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 01/18/2022   Sensation of food items getting stuck in upper esophagus to be evaluated first by esophagram and then by EGD with Dr. Carlean Purl over the next few weeks.   Hypercholesterolemia    Hypotension    Morphea 2022   Obesity    OSA (obstructive sleep apnea) 12/27/2015   Palpitations    negative cardiac  w/u per Dr. Verl Blalock 2/09   Reflux esophagitis    Sleep apnea    No CPAP   Vaginal discharge 03/02/2020    Past Surgical History:  Procedure Laterality Date   COLONOSCOPY     ECTOPIC PREGNANCY SURGERY     EXPLORATORY LAPAROTOMY  2000   x2 (Dr. Kendall Flack)   HERPES SIMPLEX VIRUS DFA     TUBAL LIGATION  1987    Current Outpatient Medications  Medication Sig Dispense Refill   metoprolol tartrate (LOPRESSOR) 100 MG tablet TAKE 2 HOURS PRIOR TO CT SCAN 1 tablet 0   aspirin 81 MG chewable tablet Chew 1 tablet (81 mg total) by mouth daily. 3 times a week 90 tablet 3   hydrocortisone (ANUSOL-HC) 25 MG suppository Place 1 suppository (25 mg total) rectally 2 (two) times daily as needed for hemorrhoids or anal itching. 12 suppository 0   No current facility-administered medications for this visit.    Allergies:   Other    ROS:  Please see the history of present illness.   Otherwise, review of systems are positive for none.   All other systems are reviewed and negative.    PHYSICAL EXAM: VS:  BP 110/64 (BP Location: Left Arm, Patient Position: Sitting, Cuff Size: Normal)  Pulse 80   Ht 5' 5"$  (1.651 m)   Wt 167 lb 6.4 oz (75.9 kg)   LMP 03/03/2016 (Approximate)   SpO2 96%   BMI 27.86 kg/m  , BMI Body mass index is 27.86 kg/m. GENERAL:  Well appearing NECK:  No jugular venous distention, waveform within normal limits, carotid upstroke brisk and symmetric, no bruits, no thyromegaly LUNGS:  Clear to auscultation bilaterally CHEST:  Unremarkable HEART:  PMI not displaced or sustained,S1 and S2 within normal limits, no S3, no S4, no clicks, no rubs, no murmurs ABD:  Flat, positive bowel sounds normal in frequency in pitch, no bruits, no rebound, no guarding, no midline pulsatile mass, no hepatomegaly, no splenomegaly EXT:  2 plus pulses throughout, no edema, no cyanosis no clubbing   EKG:  EKG is not ordered today. The ekg ordered today demonstrates sinus rhythm, rate 65,  Axis  within normal limits, intervals within normal limits, no acute ST-T wave changes. 04/09/22   Recent Labs: 11/08/2021: NT-Pro BNP 65    Lipid Panel    Component Value Date/Time   CHOL 236 (H) 09/06/2022 1006   TRIG 67 09/06/2022 1006   HDL 62 09/06/2022 1006   CHOLHDL 3.8 09/06/2022 1006   CHOLHDL 3.1 Ratio 01/04/2010 2112   VLDL 13 01/04/2010 2112   LDLCALC 163 (H) 09/06/2022 1006      Wt Readings from Last 3 Encounters:  10/02/22 167 lb 6.4 oz (75.9 kg)  09/25/22 171 lb 6.4 oz (77.7 kg)  09/06/22 169 lb 3.2 oz (76.7 kg)      Other studies Reviewed: Additional studies/ records that were reviewed today include: Labs Review of the above records demonstrates:  Please see elsewhere in the note.     ASSESSMENT AND PLAN:  PALPITATIONS:  She does not want to take meds for this.  She wants to manage these conservatively.    ELEVATED CORONARY CALCIUM:   She continues to have chest pain.  She had a negative POET (Plain Old Exercise Treadmill) in July.  However, she has high risk features and the pretest probability of obstructive CAD is at least moderate.  I will order a coronary CT.    RISK REDUCTION: Her LDL was LDL was now 163.   She will not take a statin.    We talked about a plant based diet.  She wants to follow this and repeat the lipids in 3 months.   Current medicines are reviewed at length with the patient today.  The patient does not have concerns regarding medicines.  The following changes have been made:  None  Labs/ tests ordered today include: None        Disposition:   FU with me in one year or sooner based on the CT    Signed, Minus Breeding, MD  10/02/2022 5:06 PM    [name]

## 2022-10-02 ENCOUNTER — Encounter: Payer: Self-pay | Admitting: Cardiology

## 2022-10-02 ENCOUNTER — Ambulatory Visit: Payer: 59 | Attending: Cardiology | Admitting: Cardiology

## 2022-10-02 VITALS — BP 110/64 | HR 80 | Ht 65.0 in | Wt 167.4 lb

## 2022-10-02 DIAGNOSIS — R0789 Other chest pain: Secondary | ICD-10-CM | POA: Diagnosis not present

## 2022-10-02 DIAGNOSIS — E785 Hyperlipidemia, unspecified: Secondary | ICD-10-CM | POA: Diagnosis not present

## 2022-10-02 MED ORDER — METOPROLOL TARTRATE 100 MG PO TABS: ORAL_TABLET | ORAL | 0 refills | Status: DC

## 2022-10-02 NOTE — Patient Instructions (Signed)
Lab Work:  Your physician recommends that you return for lab work in: [place]  If you have labs (blood work) drawn today and your tests are completely normal, you will receive your results only by: [place] (if you have [place]) OR A paper copy in the mail If you have any lab test that is abnormal or we need to change your treatment, we will call you to review the results.   Testing/Procedures:    Your cardiac CT will be scheduled at   [facility] [place], [place] [address] [phone]    If scheduled at [facility], please arrive at the [facility] and Children's Entrance (Entrance C2) of [facility] 30 minutes prior to test start time. You can use the FREE valet parking offered at entrance C (encouraged to control the heart rate for the test)  Proceed to the [facility] Radiology Department (first floor) to check-in and test prep.  All radiology patients and guests should use entrance C2 at [facility], accessed from [facility], even though the hospital's physical address listed is [address].       Please follow these instructions carefully (unless otherwise directed):   On the Night Before the Test: Be sure to Drink plenty of water. Do not consume any caffeinated/decaffeinated beverages or chocolate 12 hours prior to your test. Do not take any antihistamines 12 hours prior to your test.   On the Day of the Test: Drink plenty of water until 1 hour prior to the test. Do not eat any food 1 hour prior to test. You may take your regular medications prior to the test.  Take metoprolol (Lopressor) two 100 MG hours prior to test. FEMALES- please wear underwire-free bra if available, avoid dresses & tight clothing  After the Test: Drink plenty of water. After receiving IV contrast, you may experience a mild flushed feeling. This is normal. On occasion, you may experience a  mild rash up to 24 hours after the test. This is not dangerous. If this occurs, you can take Benadryl 25 mg and increase your fluid intake. If you experience trouble breathing, this can be serious. If it is severe call 911 IMMEDIATELY. If it is mild, please call our office.  We will call to schedule your test 2-4 weeks out understanding that some insurance companies will need an authorization prior to the service being performed.   For non-scheduling related questions, please contact the cardiac imaging nurse navigator should you have any questions/concerns: Marchia Bond, Cardiac Imaging Nurse Navigator Gordy Clement, Cardiac Imaging Nurse Navigator [facility] Direct Office Dial: [phone]   For scheduling needs, including cancellations and rescheduling, please call [place], [phone].    Follow-Up: At [facility], you and your health needs are our priority.  As part of our continuing mission to provide you with exceptional heart care, we have created designated Provider Care Teams.  These Care Teams include your primary Cardiologist (physician) and Advanced Practice Providers (APPs -  Physician Assistants and Nurse Practitioners) who all work together to provide you with the care you need, when you need it.  We recommend signing up for the patient portal called "MyChart".  Sign up information is provided on this After Visit Summary.  MyChart is used to connect with patients for Virtual Visits (Telemedicine).  Patients are able to view lab/test results, encounter notes, upcoming appointments, etc.  Non-urgent messages  can be sent to your provider as well.   To learn more about what you can do with MyChart, go to [email or web address].    Your next appointment:   12 month(s)  Provider:   Minus Breeding, MD

## 2022-10-04 NOTE — Progress Notes (Signed)
Internal Medicine Clinic Attending  Case discussed with Dr. Allyson Sabal  At the time of the visit.  We reviewed the resident's history and exam and pertinent patient test results.  I agree with the assessment, diagnosis, and plan of care documented in the resident's note. Ms. Woodis occasionally has sensations and perceptions of organisms invading her body.  Low suspicion of parasitic infection.

## 2022-10-11 NOTE — Progress Notes (Deleted)
Office Visit Note  Patient: Nicole [name]             Date of Birth: 1960/11/26           MRN: [id number]             PCP: [name], MD Referring: [name], MD Visit Date: 10/12/2022   Subjective:  No chief complaint on file.   History of Present Illness: Nicole [name] is a 62 y.o. female here for follow up ***   Previous HPI 01/01/22 Nicole [name] is a 62 y.o. female here for morphea evaluation for possible systemic sclerosis. She has been diagnosed by dermatology biopsy of affected area on her leg. She has skin rashes in multiple areas she estimates for about 20 years in total not sure about when these have progressed to become more noticeable.  Is associated with loss of hair as well worst in bilateral temporal areas and in her distal legs.  She has a few hyperpigmented skin patches and some areas of thickening or indentations.  Some hypopigmented areas as well but smaller spots and this is also been a scattered distribution including her torso.  She has noticed tightness or catching and tendons on the top of her feet that comes and goes feels like this takes several minutes until release and get back to normal range of movement.  She notices some swelling intermittently in her hands worse when walking and standing for prolonged time.  She saw pulmonology for assessment with no significant CT abnormalities. GI workup is planned for evaluating esophageal dysmotility. Concern is also for possible esophageal stricture or scarring related to chronic GERD.    No Rheumatology ROS completed.   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 01/18/2022   Sensation of food items getting stuck in upper esophagus to be evaluated first by esophagram and then by EGD with Dr. Carlean Purl over the next few weeks.   Hypercholesterolemia    Hypotension    Morphea 2022   Obesity    OSA (obstructive sleep apnea) 12/27/2015   Palpitations    negative cardiac w/u per  Dr. Verl Blalock 2/09   Reflux esophagitis    Sleep apnea    No CPAP   Vaginal discharge 03/02/2020    Family History  Problem Relation Age of Onset   Hypertension Mother    Diabetes Mother    Heart disease Mother    Thyroid disease Mother    Glaucoma Mother    Diabetes Father    Hypertension Sister    Diabetes Sister    Diabetes Sister    Hypertension Sister    Sleep apnea Sister    Hypertension Sister    Diabetes Sister    Hypertension Brother    Diabetes Brother    Pancreatic cancer Maternal Uncle    Heart attack Maternal Grandmother    Heart attack Maternal Grandfather     Mental illness Son    Mental illness Son    Pancreatic cancer Other        family history   Colon cancer Neg Hx    Past Surgical History:  Procedure Laterality Date   COLONOSCOPY     ECTOPIC PREGNANCY SURGERY     EXPLORATORY LAPAROTOMY  2000   x2 (Dr. Kendall Flack)   [place] VIRUS DFA     [place]   Social History   Social History Narrative   Current Social History 02/16/2021        Patient lives alone in a [place] which is 2 stories. There are 13 steps with handrails up to the entrance the patient uses.       Patient's method of transportation is city bus.      The highest level of education was high school diploma.      The patient currently disabled.      Identified important Relationships are "My mother, my sisters, children, grandchildren (62)"       Pets : None       Interests / Fun: "Read, watch TV, movies, social media, talk on phone, shopping, travel"       Current Stressors: "Simple people get on my nerves,;drama."       Religious / Personal Beliefs: "Christian, [place], Pentecostal."       Other: "I stay away from people."      L. Ducatte, BSN, RN-BC              Immunization History  Administered Date(s) Administered   Td 10/18/2005   Tdap 10/27/2012     Objective: Vital Signs: LMP 03/03/2016 (Approximate)    Physical Exam   Musculoskeletal Exam: ***  CDAI Exam: CDAI Score: -- Patient Global: --; Provider Global: -- Swollen: --; Tender: -- Joint Exam 10/12/2022   No joint exam has been documented for this visit   There is currently no information documented on the homunculus. Go to the Rheumatology activity and complete the homunculus joint exam.  Investigation: No additional findings.  Imaging: No results found.  Recent Labs: Lab Results  Component Value Date   WBC 4.5 04/07/2021   HGB 12.6 04/07/2021   PLT 222.0 04/07/2021   NA 139 04/07/2021   K 3.9 04/07/2021   CL 104 04/07/2021   CO2 25  04/07/2021   GLUCOSE 78 04/07/2021   BUN 13 04/07/2021   CREATININE 0.86 04/07/2021   BILITOT 0.6 04/07/2021   ALKPHOS 65 04/07/2021   AST 15 04/07/2021   ALT 16 04/07/2021   PROT 7.3 04/07/2021   ALBUMIN 4.6 04/07/2021   CALCIUM 9.4 04/07/2021   GFRAA 85 09/25/2019   QFTBGOLD Negative  03/14/2017    Speciality Comments: No specialty comments available.  Procedures:  No procedures performed Allergies: Other   Assessment / Plan:     Visit Diagnoses: No diagnosis found.  ***  Orders: No orders of the defined types were placed in this encounter.  No orders of the defined types were placed in this encounter.    Follow-Up Instructions: No follow-ups on file.   Collier Salina, MD  Note - This record has been created using [employer].  Chart creation errors have been sought, but may not always  have been located. Such creation errors do not reflect on  the standard of medical care.

## 2022-10-12 ENCOUNTER — Ambulatory Visit: Payer: 59 | Admitting: Internal Medicine

## 2022-10-17 ENCOUNTER — Ambulatory Visit: Payer: 59 | Attending: Internal Medicine | Admitting: Internal Medicine

## 2022-10-17 ENCOUNTER — Encounter: Payer: Self-pay | Admitting: Internal Medicine

## 2022-10-17 VITALS — BP 106/70 | HR 81 | Resp 16 | Ht 65.0 in | Wt 170.0 lb

## 2022-10-17 DIAGNOSIS — S4991XA Unspecified injury of right shoulder and upper arm, initial encounter: Secondary | ICD-10-CM

## 2022-10-17 DIAGNOSIS — S4990XA Unspecified injury of shoulder and upper arm, unspecified arm, initial encounter: Secondary | ICD-10-CM | POA: Insufficient documentation

## 2022-10-17 DIAGNOSIS — L940 Localized scleroderma [morphea]: Secondary | ICD-10-CM | POA: Diagnosis not present

## 2022-10-17 DIAGNOSIS — R937 Abnormal findings on diagnostic imaging of other parts of musculoskeletal system: Secondary | ICD-10-CM | POA: Diagnosis not present

## 2022-10-17 MED ORDER — DICLOFENAC SODIUM 1 % EX GEL: CUTANEOUS | 0 refills | Status: DC

## 2022-10-17 NOTE — Patient Instructions (Signed)
Acromioclavicular Rehab Ask your health care provider which exercises are safe for you. Do exercises exactly as told by your health care provider and adjust them as directed. It is normal to feel mild stretching, pulling, tightness, or discomfort as you do these exercises. Stop right away if you feel sudden pain or your pain gets worse. Do not begin these exercises until told by your health care provider. Stretching and range-of-motion exercises These exercises warm up your muscles and joints and improve the movement and flexibility of your shoulder. The exercises also help to relieve pain and stiffness. Pendulum This is a shoulder exercise in which you let the injured arm dangle toward the floor and then swing it like a clock pendulum. Stand near a wall or a surface that you can hold onto for balance. Bend forward at the waist and let your left / right arm hang straight down. Use your other arm to keep your balance. Relax your arm and shoulder muscles, and move your hips and your trunk so your left / right arm swings freely. Your arm should swing because of the motion of your body, not because you are using your arm or shoulder muscles. Keep moving your hips and trunk so your arm swings in the following directions, as told by your health care provider: Side to side. Forward and backward. In clockwise and counterclockwise circles. Slowly return to the starting position. Repeat __________ times. Complete this exercise __________ times a day. Shoulder flexion, seated This is a form of shoulder exercise in which you raise an arm in front of your body until you feel a stretch in your injured shoulder. Sit in a stable chair and rest your left / right forearm on a flat surface. Your elbow should rest at a height that keeps your upper arm next to your body. Keeping your left / right shoulder relaxed, lean forward at the waist and slide your hand forward until you feel a stretch in your shoulder  (flexion). You can move your chair farther away from the surface to increase the stretch by bending forward more, if needed. Hold for __________ seconds. Slowly return to the starting position. Repeat __________ times. Complete this exercise __________ times a day. Strengthening exercises These exercises build strength and endurance in your shoulder. Endurance is the ability to use your muscles for a long time, even after they get tired. Scapular retraction In this exercise, the shoulder blades (scapulae) are pulled toward each other and toward the spine. Sit in a stable chair without armrests, or stand up. Secure an exercise band to a stable object in front of you so the band is at shoulder height. Hold one end of the exercise band in each hand. Squeeze your shoulder blades together (scapular retraction) and move your elbows slightly behind you. Do not shrug your shoulders upward while you do this. Hold for __________ seconds. Slowly return to the starting position. Repeat __________ times. Complete this exercise __________ times a day. Shoulder abduction In this exercise, you raise your arm and shoulder so that they move away from the center of the body (abduction), toward the outside. Sit in a stable chair without armrests, or stand up. If directed, hold a __________ lb / kg weight in your left / right hand. Start with your arms straight down. Turn your left / right hand so your palm faces in, toward your body. Slowly lift your left / right hand out to your side. Do not lift your hand above shoulder height. Keep  your arms straight. Avoid shrugging your shoulder upward while you do this movement. Keep your shoulder blade tucked down toward the middle of your back. Hold for __________ seconds. Slowly lower your arm and return to the starting position. Repeat __________ times. Complete this exercise __________ times a day. Scapular protraction, standing In this exercise, you move the  shoulder blades away from each other, and away from the spine (protraction). This is the opposite of retraction. Stand so you are facing a wall. Place your feet about one arm-length away from the wall. Place your hands on the wall in front of you and straighten your elbows. Keep your hands on the wall as you push your upper back away from the wall. You should feel your shoulder blades sliding forward around your rib cage. Keep your elbows and your head still. Hold for __________ seconds. Slowly return to the starting position. Let your muscles relax completely before you repeat this exercise. Repeat __________ times. Complete this exercise __________ times a day. This information is not intended to replace advice given to you by your health care provider. Make sure you discuss any questions you have with your health care provider. Document Revised: 09/05/2021 Document Reviewed: 09/05/2021 Elsevier Patient Education  [place].

## 2022-10-17 NOTE — Progress Notes (Signed)
Office Visit Note  Patient: Nicole [name]             Date of Birth: [birth year]           MRN: [id number]             PCP: [name], MD Referring: [name], MD Visit Date: 10/17/2022   Subjective:  Other and Pain of the Right Shoulder (stiff) and Follow-up (Patient states her right shoulder is stiff and in pain.)   History of Present Illness: Nicole [name] is a 62 y.o. female here for follow up for the previous findings of morphea and also with joint pain in several areas most very currently in the right shoulder.  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  She has been recommended to use compression socks to minimize venous stasis but not on systemic medical therapy.  Otherwise has been doing pretty well overall though she has noticed some overall worsening with her energy and health since decreased physical activity.  She has been working on increasing this again but has right shoulder pain this initially started while walking.  Now it is becoming more painful when she tries to reach across the body and it somewhat alleviated with reaching overhead.  Symptoms are not radiating anywhere she has no known history of serious injury to the shoulder. She is also concerned about bone density and interested in screening for osteoporosis.  Previous HPI 01/01/22 Nicole [name] is a 62 y.o. female here for morphea evaluation for possible systemic sclerosis. She has been diagnosed by dermatology biopsy of affected area on her leg. She has skin rashes in multiple areas she estimates for about 20 years in total not sure about when these have progressed to become more noticeable.  Is associated with loss of hair as well worst in bilateral temporal areas and in her distal legs.  She has a few hyperpigmented skin patches and some areas  of thickening or indentations.  Some hypopigmented areas as well but smaller spots and this is also been a scattered distribution including her torso.  She has noticed tightness or catching and tendons on the top of her feet that comes and goes feels like this takes several minutes until release and get back to normal range of movement.  She notices some swelling intermittently in her hands worse when walking and standing for prolonged time.  She saw pulmonology for assessment with no significant CT abnormalities. GI workup is planned for evaluating esophageal dysmotility. Concern is also for possible esophageal stricture or scarring related to chronic GERD.    Review of Systems  Constitutional:  Positive for fatigue.  HENT:  Positive for mouth dryness. Negative for mouth sores.   Eyes:  Positive for dryness.  Respiratory:  Negative for shortness of breath.   Cardiovascular:  Positive for chest pain and palpitations.  Gastrointestinal:  Positive for constipation. Negative for blood in stool and diarrhea.  Endocrine: Negative for increased urination.  Genitourinary:  Negative for involuntary urination.  Musculoskeletal:  Positive for joint pain, gait problem, joint pain, joint swelling, myalgias, muscle weakness, morning stiffness, muscle tenderness and myalgias.  Skin:  Positive for color change and hair loss. Negative for rash and sensitivity to sunlight.  Allergic/Immunologic: Negative for susceptible to infections.  Neurological:  Positive for dizziness and headaches.  Hematological:  Negative for swollen glands.  Psychiatric/Behavioral:  Positive for depressed mood and sleep disturbance. The patient is not nervous/anxious.     PMFS History:  Patient Active Problem List   Diagnosis Date Noted   Acromioclavicular (AC) joint injury 10/17/2022   Abnormal bone density screening 10/17/2022   Hemorrhoids 09/25/2022   Arthritis of left sternoclavicular joint 09/06/2022   Hypotension, self  reported 09/06/2022   Chronic lateral foot blisters 04/26/2022   Nonintractable migraine, unspecified migraine type 04/25/2022   Prediabetes 04/25/2022   Bipolar 1 disorder, depressed, mild ([place]) 01/18/2022   Coronary artery calcification seen on CT scan 01/18/2022   Sleep difficulties 09/01/2021   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 01/18/2022   Sensation of food items getting stuck in upper esophagus to be evaluated first by esophagram and then by EGD with Dr. Carlean Purl over the next few weeks.   Hypercholesterolemia    Hypotension    Morphea 2022   Obesity    OSA (obstructive sleep apnea) 12/27/2015   Palpitations    negative cardiac w/u per Dr. Verl Blalock 2/09   Reflux esophagitis    Sleep apnea    No CPAP   Vaginal discharge 03/02/2020    Family History  Problem Relation Age of Onset   Hypertension Mother    Diabetes Mother    Heart disease Mother    Thyroid disease Mother    Glaucoma Mother    Diabetes Father    Hypertension Sister    Diabetes Sister    Diabetes Sister    Hypertension Sister    Sleep  apnea Sister    Hypertension Sister    Diabetes Sister    Hypertension Brother    Diabetes Brother    Pancreatic cancer Maternal Uncle    Heart attack Maternal Grandmother    Heart attack Maternal Grandfather    Mental illness Son    Mental illness Son    Pancreatic cancer Other        family history   Colon cancer Neg Hx    Past Surgical History:  Procedure Laterality Date   COLONOSCOPY     ECTOPIC PREGNANCY SURGERY     EXPLORATORY LAPAROTOMY  2000   x2 (Dr. Kendall Flack)   [place] VIRUS DFA     [place]   Social History   Social History Narrative   Current Social History 02/16/2021        Patient lives alone in a [place] which is 2 stories. There are 13 steps with handrails up to the entrance the patient uses.       Patient's  method of transportation is city bus.      The highest level of education was high school diploma.      The patient currently disabled.      Identified important Relationships are "My mother, my sisters, children, grandchildren (55)"       Pets : None       Interests / Fun: "Read, watch TV, movies, social media, talk on phone, shopping, travel"       Current Stressors: "Simple people get on my nerves,;drama."       Religious / Personal Beliefs: "Christian, [place], Pentecostal."       Other: "I stay away from people."      L. Ducatte, BSN, RN-BC              Immunization History  Administered Date(s) Administered   Td 10/18/2005   Tdap 10/27/2012     Objective: Vital Signs: BP 106/70 (BP Location: Left Arm, Patient Position: Sitting, Cuff Size: Normal)   Pulse 81   Resp 16   Ht '5\' 5"'$  (1.651 m)   Wt 170 lb (77.1 kg)   LMP 03/03/2016 (Approximate)   BMI 28.29 kg/m    Physical Exam Eyes:     Conjunctiva/sclera: Conjunctivae normal.  Cardiovascular:     Rate and Rhythm: Normal rate and regular rhythm.  Pulmonary:     Effort: Pulmonary effort is normal.     Breath sounds: Normal breath sounds.   Neurological:     Mental Status: She is alert.  Psychiatric:        Mood and Affect: Mood normal.      Musculoskeletal Exam:  Right shoulder with focal tenderness to pressure at the [facility] joint extending a short way posterior onto the spine of the scapula, severe pain with cross arm test Elbows full ROM no tenderness or swelling Wrists full ROM no tenderness or swelling Fingers full ROM no tenderness or swelling Knees full ROM no tenderness or swelling   Investigation: No additional findings.  Imaging: No results found.  Recent Labs: Lab Results  Component Value Date   WBC 4.5 04/07/2021   HGB 12.6 04/07/2021   PLT 222.0 04/07/2021   NA 139 04/07/2021   K 3.9 04/07/2021   CL 104 04/07/2021   CO2 25 04/07/2021   GLUCOSE 78 04/07/2021   BUN 13 04/07/2021   CREATININE 0.86 04/07/2021   BILITOT 0.6 04/07/2021   ALKPHOS 65 04/07/2021   AST 15 04/07/2021   ALT 16 04/07/2021   PROT 7.3 04/07/2021   ALBUMIN 4.6 04/07/2021   CALCIUM 9.4 04/07/2021   GFRAA 85 09/25/2019   QFTBGOLD Negative 03/14/2017    Speciality Comments: No specialty comments available.  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  Skin rash stable and doing okay with 2 evaluations by dermatology now consistent and without findings of true autoimmune disease process. No additional treatment needed here.  Injury of right acromioclavicular joint, initial encounter - Plan: diclofenac Sodium (VOLTAREN) 1 % GEL  Symptoms are consistent with right AC joint pain previous x-ray from within the past 2 years did not have significant arthritis so suspect more of a strain or sprain injury.  Discussed options including physical therapy evaluation, local steroid injection.  She prefers initial conservative approach recommended use of topical diclofenac on affected area if not getting better we can refer to  physical therapy.  Abnormal bone density  screening  Request evaluation for osteoporosis I agree she would benefit from screening with no prior bone densitometry.  She is normal for risk factors and takes supplemental vitamin D regularly.  Orders: No orders of the defined types were placed in this encounter.  Meds ordered this encounter  Medications   diclofenac Sodium (VOLTAREN) 1 % GEL    Sig: Apply to affected area up to every 6 hours as needed    Dispense:  50 g    Refill:  0     Follow-Up Instructions: Return in about 6 weeks (around 11/28/2022), or if symptoms worsen or fail to improve, for R shoulder pain topical NSAID/PT.   Collier Salina, MD  Note - This record has been created using [employer].  Chart creation errors have been sought, but may not always  have been located. Such creation errors do not reflect on  the standard of medical care.

## 2022-10-18 ENCOUNTER — Other Ambulatory Visit: Payer: Self-pay | Admitting: *Deleted

## 2022-10-18 DIAGNOSIS — Z780 Asymptomatic menopausal state: Secondary | ICD-10-CM

## 2022-10-22 ENCOUNTER — Ambulatory Visit (HOSPITAL_COMMUNITY): Admission: RE | Admit: 2022-10-22 | Payer: 59 | Source: Ambulatory Visit

## 2022-10-23 ENCOUNTER — Encounter: Payer: 59 | Admitting: Internal Medicine

## 2022-10-31 DIAGNOSIS — Z87891 Personal history of nicotine dependence: Secondary | ICD-10-CM | POA: Diagnosis not present

## 2022-10-31 DIAGNOSIS — R0602 Shortness of breath: Secondary | ICD-10-CM | POA: Diagnosis not present

## 2022-10-31 DIAGNOSIS — F17200 Nicotine dependence, unspecified, uncomplicated: Secondary | ICD-10-CM | POA: Diagnosis not present

## 2022-10-31 DIAGNOSIS — J209 Acute bronchitis, unspecified: Secondary | ICD-10-CM | POA: Diagnosis not present

## 2022-10-31 DIAGNOSIS — F17210 Nicotine dependence, cigarettes, uncomplicated: Secondary | ICD-10-CM | POA: Diagnosis not present

## 2022-11-09 DIAGNOSIS — J209 Acute bronchitis, unspecified: Secondary | ICD-10-CM | POA: Diagnosis not present

## 2022-11-22 DIAGNOSIS — Z87891 Personal history of nicotine dependence: Secondary | ICD-10-CM | POA: Diagnosis not present

## 2022-11-22 DIAGNOSIS — R0602 Shortness of breath: Secondary | ICD-10-CM | POA: Diagnosis not present

## 2022-11-22 DIAGNOSIS — R310 Gross hematuria: Secondary | ICD-10-CM | POA: Diagnosis not present

## 2022-11-22 DIAGNOSIS — Z780 Asymptomatic menopausal state: Secondary | ICD-10-CM | POA: Diagnosis not present

## 2022-11-22 DIAGNOSIS — F17210 Nicotine dependence, cigarettes, uncomplicated: Secondary | ICD-10-CM | POA: Diagnosis not present

## 2022-11-26 DIAGNOSIS — Z87891 Personal history of nicotine dependence: Secondary | ICD-10-CM | POA: Diagnosis not present

## 2022-11-28 NOTE — Progress Notes (Signed)
Office Visit Note  Patient: Nicole [name]             Date of Birth: Aug 14, 1961           MRN: [id number]             PCP: Miguel Aschoff, MD Referring: Miguel Aschoff, MD Visit Date: 11/29/2022   Subjective:  Follow-up   History of Present Illness: Nicole [name] is a 62 y.o. female here for follow up with ongoing joint pain particularly bothering her at the shoulders.  Since her last visit right shoulder pain is doing better but her left shoulder is currently causing more problems.  Gets pain with lifting and pulling activities worst across the top of the shoulder area.  She did not end up using the topical Voltaren at all because she was not very confident about the correct way to do so and prefers avoiding medications is much as possible.  Otherwise she is not experiencing any radiation of symptoms no increased joint pain or swelling elsewhere.  She was scheduled for bone density testing in August of this year.   Previous HPI 10/17/22 Nicole [name] is a 62 y.o. female here for follow up for the previous findings of morphea and also with joint pain in several areas most very currently in the right shoulder.  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  She has been recommended to use compression socks to minimize venous stasis but not on systemic medical therapy.  Otherwise has been doing pretty well overall though she has noticed some overall worsening with her energy and health since decreased physical activity.  She has been working on increasing this again but has right shoulder pain this initially started while walking.  Now it is becoming more painful when she tries to reach across the body and it somewhat alleviated with reaching overhead.  Symptoms are not radiating anywhere she has no known history of serious injury to the  shoulder. She is also concerned about bone density and interested in screening for osteoporosis.   Previous HPI 01/01/22 Nicole [name] is a 62 y.o. female here for morphea evaluation for possible systemic sclerosis. She has been diagnosed by dermatology biopsy of affected area on her leg. She has skin rashes in multiple areas she estimates for about 20 years in total not sure about when these have progressed to become more noticeable.  Is associated with loss of hair as well worst in bilateral temporal areas and in her distal legs.  She has a few hyperpigmented skin patches and some areas of thickening or indentations.  Some hypopigmented areas as well but smaller spots and this is also been a scattered distribution including her torso.  She has noticed tightness or catching and tendons on the top of her feet that comes and goes feels like this takes several minutes until release and get back to normal range of movement.  She notices some swelling intermittently in her hands worse when walking and standing for prolonged time.  She saw pulmonology for assessment with no significant CT abnormalities. GI workup is planned for evaluating esophageal dysmotility. Concern is also for possible esophageal stricture or scarring related to chronic GERD.      Review of Systems  Constitutional:  Positive for fatigue.  HENT:  Positive for mouth sores and mouth dryness.   Eyes:  Positive for dryness.  Respiratory:  Negative for shortness of breath.   Cardiovascular:  Positive for chest pain. Negative for palpitations.  Gastrointestinal:  Positive for constipation. Negative for blood in stool and diarrhea.  Endocrine: Positive for increased urination.  Genitourinary:  Negative for involuntary urination.  Musculoskeletal:  Positive for joint pain, joint pain, myalgias, morning stiffness, muscle tenderness and myalgias. Negative for gait problem, joint swelling and muscle weakness.  Skin:  Positive for hair loss.  Negative for color change, rash and sensitivity to sunlight.  Allergic/Immunologic: Positive for susceptible to infections.  Neurological:  Negative for dizziness and headaches.  Hematological:  Negative for swollen glands.  Psychiatric/Behavioral:  Positive for depressed mood and sleep disturbance. The patient is not nervous/anxious.     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 01/18/2022   Sensation of food items getting stuck in upper  esophagus to be evaluated first by esophagram and then by EGD with Dr. Leone PayorGessner over the next few weeks.   Hypercholesterolemia    Hypotension    Morphea 2022   Obesity    OSA (obstructive sleep apnea) 12/27/2015   Palpitations    negative cardiac w/u per Dr. Daleen SquibbWall 2/09   Reflux esophagitis    Sleep apnea    No CPAP   Vaginal discharge 03/02/2020    Family History  Problem Relation Age of Onset   Hypertension Mother    Diabetes Mother    Heart disease Mother    Thyroid disease Mother    Glaucoma Mother    Diabetes Father    Hypertension Sister    Diabetes Sister    Diabetes Sister    Hypertension Sister    Sleep apnea Sister    Hypertension Sister    Diabetes Sister    Hypertension Brother    Diabetes Brother    Pancreatic cancer Maternal Uncle    Heart attack Maternal Grandmother    Heart attack Maternal Grandfather    Mental illness Son    Mental illness Son    Pancreatic  cancer Other        family history   Colon cancer Neg Hx    Past Surgical History:  Procedure Laterality Date   COLONOSCOPY     ECTOPIC PREGNANCY SURGERY     EXPLORATORY LAPAROTOMY  2000   x2 (Dr. Dierdre Forth)   HERPES SIMPLEX VIRUS DFA     TUBAL LIGATION  1987   Social History   Social History Narrative   Current Social History 02/16/2021        Patient lives alone in a [place] which is 2 stories. There are 13 steps with handrails up to the entrance the patient uses.       Patient's method of transportation is city bus.      The highest level of education was high school diploma.      The patient currently disabled.      Identified important Relationships are "My mother, my sisters, children, grandchildren (5)"       Pets : None       Interests / Fun: "Read, watch TV, movies, social media, talk on phone, shopping, travel"       Current Stressors: "Simple people get on my nerves,;drama."       Religious / Personal Beliefs: "Christian, [place], Pentecostal."       Other: "I  stay away from people."      L. Ducatte, BSN, RN-BC              Immunization History  Administered Date(s) Administered   Td 10/18/2005   Tdap 10/27/2012     Objective: Vital Signs: BP 119/78 (BP Location: Right Arm, Patient Position: Sitting, Cuff Size: Normal)   Pulse 80   Resp 12   Ht 5\' 5"  (1.651 m)   Wt 166 lb (75.3 kg)   LMP 03/03/2016 (Approximate)   BMI 27.62 kg/m    Physical Exam Cardiovascular:     Rate and Rhythm: Normal rate and regular rhythm.  Pulmonary:     Effort: Pulmonary effort is normal.     Breath sounds: Normal breath sounds.  Musculoskeletal:     Right lower leg: No edema.     Left lower leg: No edema.  Skin:    General: Skin is warm and dry.  Neurological:     Mental Status: She is alert.  Psychiatric:        Mood and Affect: Mood normal.      Musculoskeletal Exam:  Left shoulder pain with pressure around AC joint, also lateral shoulder pain, very painful crossarm test has a slight restriction in active and passive range of motion with full abduction Elbows full ROM no tenderness or swelling Wrists full ROM no tenderness or swelling Fingers full ROM no tenderness or swelling Knees full ROM no tenderness or swelling   Investigation: No additional findings.  Imaging: No results found.  Recent Labs: Lab Results  Component Value Date   WBC 4.5 04/07/2021   HGB 12.6 04/07/2021   PLT 222.0 04/07/2021   NA 139 04/07/2021   K 3.9 04/07/2021   CL 104 04/07/2021   CO2 25 04/07/2021   GLUCOSE 78 04/07/2021   BUN 13 04/07/2021   CREATININE 0.86 04/07/2021   BILITOT 0.6 04/07/2021   ALKPHOS 65 04/07/2021   AST 15 04/07/2021   ALT 16 04/07/2021   PROT 7.3 04/07/2021   ALBUMIN 4.6 04/07/2021   CALCIUM 9.4 04/07/2021   GFRAA 85 09/25/2019   QFTBGOLD Negative 03/14/2017    Speciality Comments: No specialty  comments available.  Procedures:  No procedures performed Allergies: Other   Assessment / Plan:     Visit Diagnoses:  Injury of right acromioclavicular joint, sequela Chronic left shoulder pain  History of right AC joint injury but also having pain seems to be varying in severity affecting both sides.  Exam is highly consistent with acromioclavicular joint arthritis pain as the tenderness localizes on exam and with severe crossarm test pain.  Discussed possible treatment options again including medications, injections, or therapy.  She prefers to avoid any invasive procedures and medications in general as much as possible so we will plan to refer to physical therapy for bilateral shoulder pain as the left is currently worse but has been having symptoms back and forth between the 2 for at least months.  Abnormal bone density screening  She is scheduled for bone density testing later this year we will follow-up about recommendations after this.   Orders: Orders Placed This Encounter  Procedures   Ambulatory referral to Physical Therapy   No orders of the defined types were placed in this encounter.    Follow-Up Instructions: Return in about 6 months (around 05/31/2023) for ?Morphea/shoulder arthritis/DEXA f/u 6mos.   Fuller Plan, MD  Note - This record has been created using [employer].  Chart creation errors have been sought, but may not always  have been located. Such creation errors do not reflect on  the standard of medical care.

## 2022-11-29 ENCOUNTER — Encounter: Payer: Self-pay | Admitting: Internal Medicine

## 2022-11-29 ENCOUNTER — Ambulatory Visit: Payer: 59 | Attending: Internal Medicine | Admitting: Internal Medicine

## 2022-11-29 VITALS — BP 119/78 | HR 80 | Resp 12 | Ht 65.0 in | Wt 166.0 lb

## 2022-11-29 DIAGNOSIS — S4991XD Unspecified injury of right shoulder and upper arm, subsequent encounter: Secondary | ICD-10-CM | POA: Diagnosis not present

## 2022-11-29 DIAGNOSIS — M25512 Pain in left shoulder: Secondary | ICD-10-CM | POA: Diagnosis not present

## 2022-11-29 DIAGNOSIS — G8929 Other chronic pain: Secondary | ICD-10-CM | POA: Diagnosis not present

## 2022-11-29 DIAGNOSIS — S4991XS Unspecified injury of right shoulder and upper arm, sequela: Secondary | ICD-10-CM

## 2022-11-29 DIAGNOSIS — R937 Abnormal findings on diagnostic imaging of other parts of musculoskeletal system: Secondary | ICD-10-CM | POA: Diagnosis not present

## 2022-11-30 ENCOUNTER — Telehealth (HOSPITAL_COMMUNITY): Payer: Self-pay | Admitting: *Deleted

## 2022-11-30 ENCOUNTER — Telehealth: Payer: Self-pay | Admitting: *Deleted

## 2022-11-30 DIAGNOSIS — D539 Nutritional anemia, unspecified: Secondary | ICD-10-CM | POA: Diagnosis not present

## 2022-11-30 DIAGNOSIS — E7800 Pure hypercholesterolemia, unspecified: Secondary | ICD-10-CM | POA: Diagnosis not present

## 2022-11-30 DIAGNOSIS — N390 Urinary tract infection, site not specified: Secondary | ICD-10-CM | POA: Diagnosis not present

## 2022-11-30 DIAGNOSIS — A499 Bacterial infection, unspecified: Secondary | ICD-10-CM | POA: Diagnosis not present

## 2022-11-30 DIAGNOSIS — R5383 Other fatigue: Secondary | ICD-10-CM | POA: Diagnosis not present

## 2022-11-30 DIAGNOSIS — Z0000 Encounter for general adult medical examination without abnormal findings: Secondary | ICD-10-CM | POA: Diagnosis not present

## 2022-11-30 DIAGNOSIS — R0602 Shortness of breath: Secondary | ICD-10-CM | POA: Diagnosis not present

## 2022-11-30 DIAGNOSIS — E559 Vitamin D deficiency, unspecified: Secondary | ICD-10-CM | POA: Diagnosis not present

## 2022-11-30 DIAGNOSIS — Z79899 Other long term (current) drug therapy: Secondary | ICD-10-CM | POA: Diagnosis not present

## 2022-11-30 DIAGNOSIS — F17210 Nicotine dependence, cigarettes, uncomplicated: Secondary | ICD-10-CM | POA: Diagnosis not present

## 2022-11-30 DIAGNOSIS — F17200 Nicotine dependence, unspecified, uncomplicated: Secondary | ICD-10-CM | POA: Diagnosis not present

## 2022-11-30 NOTE — Telephone Encounter (Signed)
-----   Message from Dorette Grate, RN sent at 11/30/2022  8:47 AM EDT ----- Dr. Antoine Poche,  This patient was scheduled for a cardiac CT on April 15. When I called her, she said she was not comfortable doing the test and we canceled the appointment.  Thanks, Christeen Douglas

## 2022-11-30 NOTE — Telephone Encounter (Signed)
Reaching out to patient to offer assistance regarding upcoming cardiac imaging study. Patient requests that we cancel the appointment. She does not want to pursue testing.  She feels uncomfortable with the test. Informed her I would let Dr. Antoine Poche know.  She verbalized understanding.  Larey Brick RN Navigator Cardiac Imaging [facility] [phone] office [phone] cell

## 2022-11-30 NOTE — Telephone Encounter (Signed)
Spoke with pt, she is fine doing the CT scan but she is not going to take anything to lower her heart rate. Explained to the patient that her heart rate is usually in the 80's and the metoprolol will not lower it too far and will not cause a problem. She said sorry but she will not do that. Aware will forward to dr hochrein to see if other testing needed.

## 2022-12-03 ENCOUNTER — Ambulatory Visit (HOSPITAL_COMMUNITY): Admission: RE | Admit: 2022-12-03 | Payer: 59 | Source: Ambulatory Visit

## 2022-12-03 NOTE — Telephone Encounter (Signed)
Spoke with pt, aware of dr hochrein's recommendations.  The patient reports she had a CT scan of her heart and lungs last week. She is going to have those results faxed to [place] prior to having any more testing. Our fax number was given to the patient and she will have them faxed.

## 2022-12-04 DIAGNOSIS — N3000 Acute cystitis without hematuria: Secondary | ICD-10-CM | POA: Diagnosis not present

## 2022-12-04 DIAGNOSIS — R3121 Asymptomatic microscopic hematuria: Secondary | ICD-10-CM | POA: Diagnosis not present

## 2022-12-07 ENCOUNTER — Telehealth: Payer: Self-pay

## 2022-12-07 DIAGNOSIS — E559 Vitamin D deficiency, unspecified: Secondary | ICD-10-CM | POA: Diagnosis not present

## 2022-12-07 DIAGNOSIS — M8589 Other specified disorders of bone density and structure, multiple sites: Secondary | ICD-10-CM | POA: Diagnosis not present

## 2022-12-07 DIAGNOSIS — R300 Dysuria: Secondary | ICD-10-CM | POA: Diagnosis not present

## 2022-12-07 DIAGNOSIS — F17200 Nicotine dependence, unspecified, uncomplicated: Secondary | ICD-10-CM | POA: Diagnosis not present

## 2022-12-07 DIAGNOSIS — R7303 Prediabetes: Secondary | ICD-10-CM | POA: Diagnosis not present

## 2022-12-07 DIAGNOSIS — E782 Mixed hyperlipidemia: Secondary | ICD-10-CM | POA: Diagnosis not present

## 2022-12-07 DIAGNOSIS — Z0130 Encounter for examination of blood pressure without abnormal findings: Secondary | ICD-10-CM | POA: Diagnosis not present

## 2022-12-07 NOTE — Telephone Encounter (Signed)
Opened in error

## 2022-12-09 NOTE — Progress Notes (Deleted)
   CC: follow up   HPI:  [name] is a 62 y.o. with medical history of  GERD, HLD, CAD, Bipolar Disorder, presenting to [facility] for a follow up.   Please see problem-based list for further details, assessments, and plans.  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 01/18/2022   Sensation of food items getting stuck in upper esophagus to be evaluated first by esophagram and then by EGD with Dr. Leone [name] over the next few weeks.   Hypercholesterolemia    Hypotension    Morphea 2022   Obesity    OSA (obstructive sleep apnea) 12/27/2015   Palpitations    negative cardiac w/u per Dr. Daleen Squibb 2/09   Reflux esophagitis    Sleep apnea    No CPAP   Vaginal discharge 03/02/2020     Current Outpatient Medications (Cardiovascular):    metoprolol tartrate (LOPRESSOR) 100 MG tablet, TAKE 2 HOURS PRIOR TO CT SCAN (Patient not taking: Reported on 11/29/2022)  Current Outpatient Medications (Respiratory):    albuterol (VENTOLIN HFA) 108 (90 Base) MCG/ACT inhaler, Inhale into the lungs every 6 (six) hours as needed for wheezing or shortness of breath.  Current Outpatient Medications (Analgesics):    aspirin 81 MG chewable tablet, Chew 1 tablet (81 mg total) by mouth daily. 3 times a week (Patient not taking: Reported on 11/29/2022)   Current Outpatient Medications (Other):    ascorbic acid (VITAMIN C) 100 MG tablet, Take by mouth. (Patient not taking: Reported on 11/29/2022)   Cholecalciferol 50 MCG (2000 UT) CAPS, Take by mouth. (Patient not taking: Reported on 11/29/2022)   diclofenac Sodium  (VOLTAREN) 1 % GEL, Apply to affected area up to every 6 hours as needed (Patient not taking: Reported on 11/29/2022)   hydrocortisone (ANUSOL-HC) 25 MG suppository, Place 1 suppository (25 mg total) rectally 2 (two) times daily as needed for hemorrhoids or anal itching. (Patient not taking: Reported on 10/17/2022)  Review of Systems:  Review of system negative unless stated in the problem list or HPI.    Physical Exam:  There were no vitals filed for this visit. Physical Exam General: NAD HENT: NCAT Lungs: CTAB, no wheeze, rhonchi or rales.  Cardiovascular: Normal heart sounds, no r/m/g, 2+ pulses in all extremities. No LE edema Abdomen: No TTP, normal bowel sounds MSK: No asymmetry or muscle atrophy.  Skin: no lesions noted on exposed skin Neuro: Alert and oriented x4. CN grossly intact Psych: Normal mood and normal affect   Assessment & Plan:   No problem-specific Assessment & Plan notes found for this encounter.   See Encounters Tab for problem based charting.  Patient Discussed with Dr. {NAMES:[id number]::"Guilloud","Hoffman","Mullen","Narendra","Vincent","Machen","Lau","Hatcher","Williams"} Gwenevere Abbot, MD Eligha Bridegroom. [facility] Internal Medicine Residency, PGY-2

## 2022-12-10 ENCOUNTER — Encounter: Payer: 59 | Admitting: Internal Medicine

## 2022-12-13 NOTE — Telephone Encounter (Signed)
Spoke with pt, we received a copy of the CT chest she had done recently. Explained to the patient this does not really tell us anything about her heart. She is reluctant to have the cardiac CTA because of the medications given to slow the heart rate. She is going to talk with someone in the medical field and then let us know if she wants the scan or not.

## 2022-12-24 ENCOUNTER — Ambulatory Visit: Payer: 59

## 2022-12-27 ENCOUNTER — Ambulatory Visit (INDEPENDENT_AMBULATORY_CARE_PROVIDER_SITE_OTHER): Payer: 59 | Admitting: Internal Medicine

## 2022-12-27 VITALS — BP 102/67 | Temp 98.0°F | Ht 65.0 in | Wt 169.8 lb

## 2022-12-27 DIAGNOSIS — B353 Tinea pedis: Secondary | ICD-10-CM

## 2022-12-27 DIAGNOSIS — Z87440 Personal history of urinary (tract) infections: Secondary | ICD-10-CM | POA: Diagnosis not present

## 2022-12-27 DIAGNOSIS — I2510 Atherosclerotic heart disease of native coronary artery without angina pectoris: Secondary | ICD-10-CM | POA: Diagnosis not present

## 2022-12-27 DIAGNOSIS — R937 Abnormal findings on diagnostic imaging of other parts of musculoskeletal system: Secondary | ICD-10-CM | POA: Diagnosis not present

## 2022-12-27 DIAGNOSIS — E785 Hyperlipidemia, unspecified: Secondary | ICD-10-CM | POA: Diagnosis not present

## 2022-12-27 DIAGNOSIS — E7841 Elevated Lipoprotein(a): Secondary | ICD-10-CM

## 2022-12-27 DIAGNOSIS — R300 Dysuria: Secondary | ICD-10-CM

## 2022-12-27 NOTE — Progress Notes (Signed)
Ms. Koltun is here for routine f/u, her last visit with me being 09/06/2022 at which time her cholesterol recheck again showed significantly high LDL, for which she wanted to have more time for lifestyle changes (not a fan of medicine).  The following month, she saw one of our IM residents for concerns about hemorrhoids; dermatologist Dr. Isaac Laud for hair loss: Cardiologist Dr. Antoine Poche for follow-up of atypical chest discomfort; and rheumatologist Dr. Dimple Casey for follow-up of prior history of morphea (subsequently ruled out).  In April she saw Dr. Dimple Casey again for L shoulder "out of position".  She has also been seen providers at [facility] recently for urinary tract infection "it was mild (odor) but they gave me Macrobid, but the Macrobid caused me to get blisters on my feet so I stopped it".  She feels that she may have not completely cleared the infection as she has episodic mild dysuria.  [name] has ordered a DEXA scan. [name] ordered A1c and lipids a couple of months ago (she saw a cardiology provider there for some reason "but my real cardiologist is at [facility]") because she wanted them checked and it was convenient.   She reports doing fairly well.  Likes to stay at home,  not a social person,  frustrated by people who "show out". Endorses walking for exercise.  Trying to eat well, though states that when she gets food dollars form insurance, she "pigs out". Continues to feel her episodic and short lived spells of L anterior CP, described as " heart pinch", for which she massages chest "over the blocked artery", and takes breaths and coughs to make it go away.   Had A1c and lipids about a month ago, wanted to check it again. Uses lemon and honey  Recently Ldl was reportedly 125 at [facility] by diet alone.  BP 102/67 (BP Location: Right Arm, Patient Position: Sitting, Cuff Size: Small)   Temp 98 F (36.7 C) (Oral)   Ht 5\' 5"  (1.651 m)   Wt 169 lb 12.8 oz (77 kg)   LMP 03/03/2016 (Approximate)    SpO2 98%   BMI 28.26 kg/m  Nicely dressed and groomed, normal mood and affect, talkative.  Paused occasionally to rub/press on her chest over the area of recurrent discomfort.  There is an area of point tenderness over L anterior mid ribcage which she states is the pain which bothers her (not positional, not worse with deep breath). Heart RRR but with occasional skipped beat (about every 5-7 nml beats).  L shoulder appears symmetric to R, though she is adamant that it is "out of position".   No tenderness along clavicle or shoulder joints. LE no edema.  Feet with scaling of soles and sides of feet. DP pulses 2+ symmetric. Nearly healed small blisters R foot noted just proximal to web of 1st and 2nd toes and over base of 1st toe.    Assessment and plan:  Ms. Carlston is doing ok. Med list reviewed and she hasn't been taking most prescriptions by choice, as she doesn't like to take medicine, preferring more natural remedies which she researches on the internet.  A few active issues ongoing but I discussed the importance of her remaining with one medical practice rather than bouncing between [place] and [facility].  She wants to keep [facility] as her primary but likes the convenience of [place].  I explained that we wouldn't repeat blood tests here just to have them on file in two places.    Coronary artery  calcification seen on CT scan Asymptomatic.  The pinched feeling in her chest today is localizing in the rib cage; she can massage it to make the discomfort resolved.  Has self-discontinued her daily low dose aspirin by choice (dislikes taking medications).   Hyperlipidemia She had her lipids checked at [facility] a couple of months ago (records not visible; she was reticent to sign a release of information document as she didn't think it was appropriate for [place] to see all of her records there).  Rather than rechecking today (which she desired) I elect to minimize resource utilization and wait for her to provide a  copy of her results.  She is using lemon and honey to naturally reduce her cholesterol.  Has cut out butter.  Still enjoying fried foods.  Still resistant to statin.  History of recurrent UTI (urinary tract infection) Recheck UA today given ongoing mild intermittent dysuria after she didn't complete Macrobid for UTI dxed at [facility] (which presented with urine odor "it was a light infxn")  Abnormal bone density screening Patient elected to have her DXA done through [facility].    Tinea pedis Declines topical as she was told that the fungus was all throughout her body and she needed to be treated from the inside.  I wonder if she means oral antifungal I.e. terbinafine or fluconazole? Despite her reluctance to take medicines generally, she stated interest in taking a pill.  Lotrisone (clotrimazole with steroid) prescribed by podiatrist per chart review - not sure if she took it. She desires a different podiatrist than the one she most recently saw; agrees to return to same practice.

## 2022-12-27 NOTE — Patient Instructions (Signed)
Thanks for coming in today Nicole [name], it was nice to see you.  We will run the tests on your urine and I'll call you with results.  I hope you have success in having [name] send your cholesterol tests to [place] so that I can keep track of your success.  Give some thought to where you'd like to have your primary care based.  It sounds as though [name] is most convenient and has some specialist access there so it's ok if you'd like to center your care there!  You can still keep your [facility] specialists in that case.  Take care and stay well,  Dr. Mayford Knife

## 2022-12-28 LAB — URINALYSIS, ROUTINE W REFLEX MICROSCOPIC
Bilirubin, UA: NEGATIVE
Glucose, UA: NEGATIVE
Ketones, UA: NEGATIVE
Leukocytes,UA: NEGATIVE
Nitrite, UA: NEGATIVE
Protein,UA: NEGATIVE
RBC, UA: NEGATIVE
Specific Gravity, UA: 1.015 (ref 1.005–1.030)
Urobilinogen, Ur: 0.2 mg/dL (ref 0.2–1.0)
pH, UA: 6.5 (ref 5.0–7.5)

## 2022-12-30 LAB — URINE CULTURE

## 2022-12-31 ENCOUNTER — Encounter: Payer: Self-pay | Admitting: Internal Medicine

## 2022-12-31 NOTE — Assessment & Plan Note (Signed)
She had her lipids checked at [facility] a couple of months ago (records not visible; she was reticent to sign a release of information document as she didn't think it was appropriate for [place] to see all of her records there).  Rather than rechecking today (which she desired) I elect to minimize resource utilization and wait for her to provide a copy of her results.  She is using lemon and honey to naturally reduce her cholesterol.  Has cut out butter.  Still enjoying fried foods.  Still resistant to statin.

## 2022-12-31 NOTE — Assessment & Plan Note (Signed)
Asymptomatic.  The pinched feeling in her chest today is localizing in the rib cage; she can massage it to make the discomfort resolved.  Has self-discontinued her daily low dose aspirin by choice (dislikes taking medications).

## 2022-12-31 NOTE — Assessment & Plan Note (Signed)
Recheck UA today given ongoing mild intermittent dysuria after she didn't complete Macrobid for UTI dxed at [facility] (which presented with urine odor "it was a light infxn")

## 2022-12-31 NOTE — Assessment & Plan Note (Signed)
Patient elected to have her DXA done through [facility].

## 2022-12-31 NOTE — Assessment & Plan Note (Addendum)
Declines topical as she was told that the fungus was all throughout her body and she needed to be treated from the inside.  I wonder if she means oral antifungal I.e. terbinafine or fluconazole? Despite her reluctance to take medicines generally, she stated interest in taking a pill.  Lotrisone (clotrimazole with steroid) prescribed by podiatrist per chart review - not sure if she took it. She desires a different podiatrist than the one she most recently saw; agrees to return to same practice.

## 2023-01-08 ENCOUNTER — Ambulatory Visit: Payer: 59 | Attending: Internal Medicine | Admitting: Physical Therapy

## 2023-01-08 ENCOUNTER — Other Ambulatory Visit: Payer: Self-pay

## 2023-01-08 ENCOUNTER — Encounter: Payer: Self-pay | Admitting: Physical Therapy

## 2023-01-08 DIAGNOSIS — M25511 Pain in right shoulder: Secondary | ICD-10-CM | POA: Insufficient documentation

## 2023-01-08 DIAGNOSIS — G8929 Other chronic pain: Secondary | ICD-10-CM | POA: Insufficient documentation

## 2023-01-08 DIAGNOSIS — M6281 Muscle weakness (generalized): Secondary | ICD-10-CM | POA: Diagnosis not present

## 2023-01-08 DIAGNOSIS — S4991XS Unspecified injury of right shoulder and upper arm, sequela: Secondary | ICD-10-CM | POA: Diagnosis not present

## 2023-01-08 DIAGNOSIS — M25512 Pain in left shoulder: Secondary | ICD-10-CM | POA: Diagnosis not present

## 2023-01-08 NOTE — Therapy (Signed)
OUTPATIENT PHYSICAL THERAPY SHOULDER EVALUATION   Patient Name: Nicole [name] MRN: [id number] DOB:December 04, 1960, 62 y.o., female Today's Date: 01/08/2023   PT End of Session - 01/08/23 1126     Visit Number 1    Number of Visits --   1-2x/week   Date for PT Re-Evaluation 03/05/23    Authorization Type [employer] MCR    Progress Note Due on Visit 10    PT Start Time 1045    PT Stop Time 1120    PT Time Calculation (min) 35 min             Past Medical History:  Diagnosis Date   Acute pain of right knee 01/04/2021   Acute pain of right shoulder 01/04/2021   Anemia    Anxiety    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 01/18/2022   Sensation of food items getting stuck in upper esophagus to be evaluated first by esophagram and then by EGD with Dr. Leone Payor over the next few weeks.   Hypercholesterolemia    Hypotension    Morphea 2022   Nodule of skin of breast 01/04/2021   Obesity    OSA (obstructive sleep apnea) 12/27/2015   Palpitations    negative cardiac w/u per Dr. Daleen Squibb 2/09   Reflux esophagitis    Sleep apnea    No CPAP   Vaginal discharge 03/02/2020   Past Surgical History:  Procedure Laterality Date   COLONOSCOPY     ECTOPIC PREGNANCY SURGERY     EXPLORATORY LAPAROTOMY  2000   x2 (Dr. Dierdre Forth)   HERPES SIMPLEX VIRUS DFA     TUBAL LIGATION  1987   Patient Active Problem List   Diagnosis Date Noted   Acromioclavicular (AC) joint injury 10/17/2022   Abnormal bone density screening 10/17/2022   Hemorrhoids 09/25/2022   Arthritis of left sternoclavicular joint 09/06/2022   Hypotension, self reported 09/06/2022   Chronic lateral foot blisters 04/26/2022   Nonintractable migraine, unspecified  migraine type 04/25/2022   Prediabetes 04/25/2022   Bipolar 1 disorder, depressed, mild (HCC) 01/18/2022   Coronary artery calcification seen on CT scan 01/18/2022   Sleep difficulties 09/01/2021   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 07/03/2021   Other personal history of psychological trauma, not elsewhere classified 03/16/2021   Hyperlipidemia 11/10/2019   History of recurrent UTI (urinary tract infection) 11/12/2017   Marijuana use 11/12/2017   Pain in left shoulder 07/20/2016   Hematuria of unknown etiology 06/01/2013   Tinea pedis 04/14/2007    PCP: Miguel Aschoff, MD  REFERRING PROVIDER: Fuller Plan, MD  THERAPY DIAG:  Chronic pain of both shoulders  Muscle weakness  REFERRING DIAG: Injury of right acromioclavicular joint, sequela [S49.91XS], Chronic left shoulder pain [M25.512, G89.29]   Rationale for Evaluation and Treatment:  Rehabilitation  SUBJECTIVE:  PERTINENT PAST HISTORY:  Bipolar 1, AC joint arthritis         PRECAUTIONS: None  WEIGHT BEARING RESTRICTIONS No  FALLS:  Has patient fallen in last 6 months? No, Number of falls: 0  MOI/History of condition:  Onset date:  >3 years  SUBJECTIVE STATEMENT  Nicole [name] is a 62  y.o. female who presents to clinic with chief complaint of L>R shoulder pain which has been occurring intermittently for > 3 year.  There are about 3 episodes a year which last for several days.  She states that she is not having the pain currently.  She denies trauma or acute injury.  She states she had an x-ray and this showed some misalignment.  I am unable to see this report.  She locates the pain around the [facility] joint.  She wants to avoid pain recurrence.    Red flags:  denies   Pain:  Are you having pain? No  Occupation: None  Assistive Device: NA  Hand Dominance: R  Patient Goals/Specific Activities: Get stronger and avoid the pain   OBJECTIVE:     DIAGNOSTIC FINDINGS:  Pt states that her shoulder x-ray showed some misalignment but I am unable to see this  GENERAL OBSERVATION:  Forward head, rounded shoulders     SENSATION:  Light touch: Appears intact   PALPATION: Minimal TTP L>R AC joint  UPPER EXTREMITY AROM:  ROM Right 01/08/2023 Left 01/08/2023  Shoulder flexion 150 150  Shoulder abduction 150 150  Shoulder internal rotation    Shoulder external rotation    Functional IR T10 T10  Functional ER T2 T2  Shoulder extension    Elbow extension    Elbow flexion     (Blank rows = not tested, N = WNL, * = concordant pain with testing)  UPPER EXTREMITY MMT:  MMT Right 01/08/2023 Left 01/08/2023  Shoulder flexion 4* 4*  Shoulder abduction (C5) 4* 4*  Shoulder ER 4 4  Shoulder IR 4 4  Middle trapezius 4 4  Lower trapezius 3+ 3+  Shoulder extension    Grip strength    Cervical flexion (C1,C2)    Cervical S/B (C3)    Shoulder shrug (C4)    Elbow flexion (C6)    Elbow ext (C7)    Thumb ext (C8)    Finger abd (T1)    Grossly     (Blank rows = not tested, score listed is out of 5 possible points.  N = WNL, D = diminished, C = clear for gross weakness with myotome testing, * = concordant pain with testing)   MUSCLE LENGTH:    Pec Major: L (-), R (-) for restriction  UPPER EXTREMITY PROM:  PROM Right 01/08/2023 Left 01/08/2023  Shoulder flexion    Shoulder abduction    Shoulder internal rotation    Shoulder external rotation    Functional IR    Functional ER    Shoulder extension    Elbow extension    Elbow flexion     (Blank rows = not tested, N = WNL, * = concordant pain with testing)  SHOULDER SPECIAL TESTS:  AC crossover (+) R, (-) L  Drop arm (-)  ER (-)  Painful arc (-)  PATIENT SURVEYS:  FOTO 53 -> 64    TODAY'S TREATMENT:  Creating, reviewing, and completing below HEP   PATIENT EDUCATION:  POC, diagnosis, prognosis, HEP, and outcome measures.  Pt educated via explanation,  demonstration, and handout (HEP).  Pt confirms understanding verbally.   HOME EXERCISE PROGRAM: Access Code: [email or web address] URL: [email or web address] Date: 01/08/2023 Prepared by: Alphonzo Severance  Exercises - Doorway Pec Stretch at 90 Degrees Abduction  - 1 x daily - 7 x weekly - 3 reps - 45 hold - Shoulder External Rotation and Scapular Retraction with Resistance  - 1 x daily - 7 x  weekly - 3 sets - 10 reps - Standing Shoulder Row with Anchored Resistance  - 1 x daily - 7 x weekly - 3 sets - 10 reps  Treatment priorities   Eval (01/08/2023)        General shoulder strengthening working toward gym HEP                                          ASSESSMENT:  CLINICAL IMPRESSION: Lizabella is a 62 y.o. female who presents to clinic with signs and sxs consistent with physician impression of [facility] pathology L>R.  (-) R/C test cluster with (+) cross arm and pt identification of AC joint as source of pain when present.  Pt with no pain at rest on exam, but does have increase in pain with cross arm and resisted flexion.    OBJECTIVE IMPAIRMENTS: Pain, shoulder ROM, shoulder strength  ACTIVITY LIMITATIONS: lifting heavy objects, particularly above shoulder height  PERSONAL FACTORS: See medical history and pertinent history   REHAB POTENTIAL: Good  CLINICAL DECISION MAKING: Stable/uncomplicated  EVALUATION COMPLEXITY: Low   GOALS:   SHORT TERM GOALS: Target date: 02/05/2023  Cayton will be >75% HEP compliant to improve carryover between sessions and facilitate independent management of condition  Evaluation (01/08/2023): ongoing Goal status: INITIAL   LONG TERM GOALS: Target date: 03/05/2023  Wanza will improve FOTO score to 64 as a proxy for functional improvement  Evaluation/Baseline (01/08/2023): 53 Goal status: INITIAL   2.  Suleyma will report confidence in self management of condition at time of discharge with advanced HEP  Evaluation/Baseline (01/08/2023): unable  to self manage Goal status: INITIAL   3.  Latiera will improve the following MMTs to >/= 4+/5 to show improvement in strength:     Evaluation/Baseline (01/08/2023):  UPPER EXTREMITY MMT:  MMT Right 01/08/2023 Left 01/08/2023  Shoulder flexion 4* 4*  Shoulder abduction (C5) 4* 4*  Shoulder ER 4 4  Shoulder IR 4 4  Middle trapezius 4 4  Lower trapezius 3+ 3+  Shoulder extension    Grip strength    Cervical flexion (C1,C2)    Cervical S/B (C3)    Shoulder shrug (C4)    Elbow flexion (C6)    Elbow ext (C7)    Thumb ext (C8)    Finger abd (T1)    Grossly     (Blank rows = not tested, score listed is out of 5 possible points.  N = WNL, D = diminished, C = clear for gross weakness with myotome testing, * = concordant pain with testing)  Goal status: INITIAL    PLAN: PT FREQUENCY: 1-2x/week  PT DURATION: 8 weeks (Ending 03/05/2023)  PLANNED INTERVENTIONS: Therapeutic exercises, Aquatic therapy, Therapeutic activity, Neuro Muscular re-education, Gait training, Patient/Family education, Joint mobilization, Dry Needling, Electrical stimulation, Spinal mobilization and/or manipulation, Moist heat, Taping, Vasopneumatic device, Ionotophoresis 4mg /ml Dexamethasone, and Manual therapy   Alphonzo Severance PT, DPT 01/08/2023, 11:27 AM

## 2023-01-17 ENCOUNTER — Encounter: Payer: Self-pay | Admitting: Podiatry

## 2023-01-17 ENCOUNTER — Ambulatory Visit (INDEPENDENT_AMBULATORY_CARE_PROVIDER_SITE_OTHER): Payer: 59 | Admitting: Podiatry

## 2023-01-17 DIAGNOSIS — D2372 Other benign neoplasm of skin of left lower limb, including hip: Secondary | ICD-10-CM | POA: Diagnosis not present

## 2023-01-17 DIAGNOSIS — D2371 Other benign neoplasm of skin of right lower limb, including hip: Secondary | ICD-10-CM | POA: Diagnosis not present

## 2023-01-17 DIAGNOSIS — B353 Tinea pedis: Secondary | ICD-10-CM

## 2023-01-17 MED ORDER — NAFTIFINE HCL 2 % EX CREA: 1.0000 [drp] | TOPICAL_CREAM | CUTANEOUS | 2 refills | Status: DC

## 2023-01-17 NOTE — Progress Notes (Signed)
She presents today chief complaint of athlete's foot as well as painful calluses beneath the head of the fifth metatarsals bilateral states that she feels like she is walking on rocks.  Objective: Vital signs stable alert oriented x 3 pulses are strong palpable.  She does have dry xerotic skin with what appears to be tinea pedis in a moccasin distribution on the plantar aspect of the bilateral foot.  Her toenails are also slightly thickened.  She does have painful benign skin lesions subfifth metatarsal heads bilaterally left greater than right.  No preulcerative lesions noted.  Assessment: Pain in limb secondary to tinea pedis and metatarsal head left foot.  Plan: Started her on Naftin cream she will apply this twice daily for the next 2 to 3 weeks or until cleared.  We debrided painful lesions today to normal skin.  No iatrogenic lesions noted.

## 2023-01-22 ENCOUNTER — Ambulatory Visit: Payer: 59 | Attending: Internal Medicine | Admitting: Physical Therapy

## 2023-01-22 ENCOUNTER — Encounter: Payer: Self-pay | Admitting: Physical Therapy

## 2023-01-22 DIAGNOSIS — M6281 Muscle weakness (generalized): Secondary | ICD-10-CM | POA: Insufficient documentation

## 2023-01-22 DIAGNOSIS — G8929 Other chronic pain: Secondary | ICD-10-CM | POA: Insufficient documentation

## 2023-01-22 DIAGNOSIS — M25511 Pain in right shoulder: Secondary | ICD-10-CM | POA: Diagnosis not present

## 2023-01-22 DIAGNOSIS — R252 Cramp and spasm: Secondary | ICD-10-CM | POA: Diagnosis not present

## 2023-01-22 DIAGNOSIS — M25512 Pain in left shoulder: Secondary | ICD-10-CM | POA: Diagnosis not present

## 2023-01-22 NOTE — Therapy (Signed)
OUTPATIENT PHYSICAL THERAPY SHOULDER EVALUATION   Patient Name: Nicole [name] MRN: [id number] DOB:1961-02-03, 62 y.o., female Today's Date: 01/22/2023   PT End of Session - 01/22/23 1000     Visit Number 2    Number of Visits --   1-2x/week   Date for PT Re-Evaluation 03/05/23    Authorization Type [employer] MCR    Progress Note Due on Visit 10    PT Start Time 1000    PT Stop Time 1041    PT Time Calculation (min) 41 min             Past Medical History:  Diagnosis Date   Acute pain of right knee 01/04/2021   Acute pain of right shoulder 01/04/2021   Anemia    Anxiety    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 01/18/2022   Sensation of food items getting stuck in upper esophagus to be evaluated first by esophagram and then by EGD with Dr. Leone Payor over the next few weeks.   Hypercholesterolemia    Hypotension    Morphea 2022   Nodule of skin of breast 01/04/2021   Obesity    OSA (obstructive sleep apnea) 12/27/2015   Palpitations    negative cardiac w/u per Dr. Daleen Squibb 2/09   Reflux esophagitis    Sleep apnea    No CPAP   Vaginal discharge 03/02/2020   Past Surgical History:  Procedure Laterality Date   COLONOSCOPY     ECTOPIC PREGNANCY SURGERY     EXPLORATORY LAPAROTOMY  2000   x2 (Dr. 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 07/03/2021   Other personal history of psychological trauma, not elsewhere classified 03/16/2021   Throat irritation 12/16/2019   Hyperlipidemia 11/10/2019   History of recurrent UTI (urinary tract infection) 11/12/2017   Marijuana use 11/12/2017   Hoarseness of voice 10/25/2016   Pain in left shoulder 07/20/2016   Hematuria of unknown etiology 06/01/2013   Tinea pedis 04/14/2007    PCP: Miguel Aschoff, MD  REFERRING PROVIDER: Miguel Aschoff, MD  THERAPY DIAG:  Chronic pain of both shoulders  Muscle weakness  Cramp and spasm  Muscle weakness (generalized)  REFERRING DIAG: Injury of right acromioclavicular joint, sequela [S49.91XS], Chronic left shoulder pain [M25.512, G89.29]   Rationale for Evaluation and Treatment:  Rehabilitation  SUBJECTIVE:  PERTINENT PAST HISTORY:  Bipolar 1, AC joint arthritis         PRECAUTIONS: None  WEIGHT BEARING RESTRICTIONS No  FALLS:  Has patient fallen in last 6 months? No, Number of falls: 0  MOI/History of condition:  Onset date:  >3 years  SUBJECTIVE STATEMENT  Nicole [name] is a 62 y.o. female who presents to clinic with chief complaint of L>R shoulder pain which has been occurring intermittently for > 3 year.  There are about 3 episodes a year which last for several days.  She states that she is not having the pain currently.  She denies trauma or acute injury.  She states she had an x-ray and this showed some misalignment.  I am unable to see this report.  She locates the pain around the [facility] joint.  She wants to avoid pain recurrence.   Pain:  Are you  having pain? No     OBJECTIVE:    DIAGNOSTIC FINDINGS:  Pt states that her shoulder x-ray showed some misalignment but I am unable to see this  GENERAL OBSERVATION:  Forward head, rounded shoulders     SENSATION:  Light touch: Appears intact   PALPATION: Minimal TTP L>R AC joint  UPPER EXTREMITY AROM:  ROM Right Left   Shoulder flexion 150 150  Shoulder abduction 150 150  Shoulder internal rotation    Shoulder external rotation    Functional IR T10 T10  Functional ER T2 T2  Shoulder extension    Elbow extension    Elbow flexion     (Blank rows = not tested, N = WNL, * = concordant pain with testing)  UPPER EXTREMITY MMT:  MMT Right  Left   Shoulder flexion 4* 4*  Shoulder abduction (C5) 4* 4*  Shoulder ER 4 4  Shoulder IR 4 4  Middle trapezius 4 4  Lower trapezius 3+ 3+  Shoulder extension    Grip strength    Cervical flexion (C1,C2)    Cervical S/B (C3)    Shoulder shrug (C4)    Elbow flexion (C6)    Elbow ext (C7)    Thumb ext (C8)    Finger abd (T1)    Grossly     (Blank rows = not tested, score listed is out of 5 possible points.  N = WNL, D = diminished, C = clear for gross weakness with myotome testing, * = concordant pain with testing)   MUSCLE LENGTH:    Pec Major: L (-), R (-) for restriction  UPPER EXTREMITY PROM:  PROM Right  Left   Shoulder flexion    Shoulder abduction    Shoulder internal rotation    Shoulder external rotation    Functional IR    Functional ER    Shoulder extension    Elbow extension    Elbow flexion     (Blank rows = not tested, N = WNL, * = concordant pain with testing)  SHOULDER SPECIAL TESTS:  AC crossover (+) R, (-) L  Drop arm (-)  ER (-)  Painful arc (-)  PATIENT SURVEYS:  FOTO 53 -> 64   PATIENT EDUCATION:  POC, diagnosis, prognosis, HEP, and outcome measures.  Pt educated via explanation, demonstration, and handout (HEP).  Pt confirms understanding verbally.   HOME EXERCISE  PROGRAM: Access Code: [email or web address] URL: [email or web address] Date: 01/22/2023 Prepared by: Alphonzo Severance  Exercises - Doorway Pec Stretch at 90 Degrees Abduction  - 1 x daily - 7 x weekly - 3 reps - 45 hold - Shoulder External Rotation and Scapular Retraction with Resistance  - 1 x daily - 7 x weekly - 3 sets - 10 reps -  Standing Shoulder Row with Anchored Resistance  - 1 x daily - 7 x weekly - 3 sets - 10 reps - Sidelying Shoulder External Rotation  - 1 x daily - 7 x weekly - 3 sets - 10 reps - Sidelying Shoulder Flexion 15 Degrees  - 1 x daily - 7 x weekly - 3 sets - 10 reps - Sidelying Shoulder Horizontal Abduction  - 1 x daily - 7 x weekly - 3 sets - 10 reps  Treatment priorities   Eval         General shoulder strengthening working toward gym HEP                                         [facility] Adult PT Treatment:                                                DATE: 01/22/23  Therapeutic Exercise: nu-step L7 66m while taking subjective and planning session with patient Shoulder rolls GTB row - 3x10 Sidelying: ER -> Flexion -> Horizontal abd - 15x ea x3 Towel slide with lift off - 3x10 Low row - 2x10 - 20# High row - 2x10 - 20# Alternating Y in prone - 3x10  ASSESSMENT:  CLINICAL IMPRESSION: Darielys tolerated session well with no adverse reaction.  Concentrated on general shoulder girdle strengthening to good effect.  Pt with fatigue, but no increase in pain.  Updated HEP.  Continue per POC.  OBJECTIVE IMPAIRMENTS: Pain, shoulder ROM, shoulder strength  ACTIVITY LIMITATIONS: lifting heavy objects, particularly above shoulder height  PERSONAL FACTORS: See medical history and pertinent history   REHAB POTENTIAL: Good  CLINICAL DECISION MAKING: Stable/uncomplicated  EVALUATION COMPLEXITY: Low   GOALS:   SHORT TERM GOALS: Target date: 02/05/23  Keenan will be >75% HEP compliant to improve carryover between sessions and facilitate independent management  of condition  Evaluation: ongoing Goal status: INITIAL   LONG TERM GOALS: Target date: 03/05/23  Thiana will improve FOTO score to 64 as a proxy for functional improvement  Evaluation/Baseline: 53 Goal status: INITIAL   2.  Milahn will report confidence in self management of condition at time of discharge with advanced HEP  Evaluation/Baseline: unable to self manage Goal status: INITIAL   3.  Kimore will improve the following MMTs to >/= 4+/5 to show improvement in strength:     Evaluation/Baseline:  UPPER EXTREMITY MMT:  MMT Right 01/08/2023 Left 01/08/2023  Shoulder flexion 4* 4*  Shoulder abduction (C5) 4* 4*  Shoulder ER 4 4  Shoulder IR 4 4  Middle trapezius 4 4  Lower trapezius 3+ 3+  Shoulder extension    Grip strength    Cervical flexion (C1,C2)    Cervical S/B (C3)    Shoulder shrug (C4)    Elbow flexion (C6)    Elbow ext (C7)    Thumb ext (C8)    Finger abd (T1)    Grossly     (Blank rows = not tested, score listed is out of 5 possible points.  N = WNL, D = diminished, C = clear for gross weakness with myotome testing, * = concordant pain with testing)  Goal status: INITIAL    PLAN: PT FREQUENCY: 1-2x/week  PT DURATION: 8 weeks  PLANNED  INTERVENTIONS: Therapeutic exercises, Aquatic therapy, Therapeutic activity, Neuro Muscular re-education, Gait training, Patient/Family education, Joint mobilization, Dry Needling, Electrical stimulation, Spinal mobilization and/or manipulation, Moist heat, Taping, Vasopneumatic device, Ionotophoresis 4mg /ml Dexamethasone, and Manual therapy   Alphonzo Severance PT, DPT 01/22/2023, 10:42 AM

## 2023-01-29 ENCOUNTER — Encounter: Payer: Self-pay | Admitting: Physical Therapy

## 2023-01-29 ENCOUNTER — Ambulatory Visit: Payer: 59 | Admitting: Physical Therapy

## 2023-01-29 DIAGNOSIS — M6281 Muscle weakness (generalized): Secondary | ICD-10-CM

## 2023-01-29 DIAGNOSIS — M25511 Pain in right shoulder: Secondary | ICD-10-CM | POA: Diagnosis not present

## 2023-01-29 DIAGNOSIS — M25512 Pain in left shoulder: Secondary | ICD-10-CM | POA: Diagnosis not present

## 2023-01-29 DIAGNOSIS — G8929 Other chronic pain: Secondary | ICD-10-CM

## 2023-01-29 DIAGNOSIS — R252 Cramp and spasm: Secondary | ICD-10-CM | POA: Diagnosis not present

## 2023-01-29 NOTE — Therapy (Addendum)
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 01/18/2022   Sensation of food items getting stuck in upper esophagus to be evaluated first by esophagram and then by EGD with Dr. Leone Payor over the next few weeks.   Hypercholesterolemia    Hypotension    Morphea 2022   Nodule of skin of breast 01/04/2021   Obesity    OSA (obstructive sleep apnea) 12/27/2015   Palpitations    negative cardiac w/u per Dr. Daleen Squibb 2/09   Reflux esophagitis    Sleep apnea    No CPAP   Vaginal discharge 03/02/2020   Past Surgical History:  Procedure Laterality Date   COLONOSCOPY     ECTOPIC PREGNANCY SURGERY     EXPLORATORY LAPAROTOMY  2000   x2 (Dr. 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 07/03/2021   Other personal history of psychological trauma, not elsewhere classified 03/16/2021   Throat irritation 12/16/2019   Hyperlipidemia 11/10/2019   History of recurrent UTI (urinary tract infection) 11/12/2017   Marijuana use 11/12/2017   Hoarseness of voice 10/25/2016   Pain in left shoulder 07/20/2016   Hematuria of unknown etiology 06/01/2013   Tinea pedis 04/14/2007    PCP: Miguel Aschoff, MD  REFERRING PROVIDER: Miguel Aschoff, MD  THERAPY DIAG:  Chronic pain of both shoulders  Muscle weakness  Muscle weakness (generalized)  REFERRING DIAG: Injury of right acromioclavicular joint, sequela [S49.91XS], Chronic left shoulder pain [M25.512, G89.29]   Rationale for Evaluation and Treatment:  Rehabilitation  SUBJECTIVE:  PERTINENT PAST HISTORY:  Bipolar 1, AC joint arthritis         PRECAUTIONS: None  WEIGHT BEARING RESTRICTIONS No  FALLS:  Has patient fallen in last 6  months? No, Number of falls: 0  MOI/History of condition:  Onset date:  >3 years  SUBJECTIVE STATEMENT  Pt reports that she had some pain ~2/10 when carrying bags over the the weekend.  She has no pain currently.  Pain:  Are you having pain? No     OBJECTIVE:    DIAGNOSTIC FINDINGS:  Pt states that her shoulder x-ray showed some misalignment but I am unable to see this  GENERAL OBSERVATION:  Forward head, rounded shoulders     SENSATION:  Light touch: Appears intact   PALPATION: Minimal TTP L>R AC joint  UPPER EXTREMITY AROM:  ROM Right Left   Shoulder flexion 150 150  Shoulder abduction 150 150  Shoulder internal rotation     Shoulder external rotation    Functional IR T10 T10  Functional ER T2 T2  Shoulder extension    Elbow extension    Elbow flexion     (Blank rows = not tested, N = WNL, * = concordant pain with testing)  UPPER EXTREMITY MMT:  MMT Right  Left   Shoulder flexion 4* 4*  Shoulder abduction (C5) 4* 4*  Shoulder ER 4 4  Shoulder IR 4 4  Middle trapezius 4 4  Lower trapezius 3+ 3+  Shoulder extension    Grip strength    Cervical flexion (C1,C2)    Cervical S/B (C3)    Shoulder shrug (C4)    Elbow flexion (C6)    Elbow ext (C7)    Thumb ext (C8)    Finger abd (T1)    Grossly     (Blank rows = not tested, score listed is out of 5 possible points.  N = WNL, D = diminished, C = clear for gross weakness with myotome testing, * = concordant pain with testing)   MUSCLE LENGTH:    Pec Major: L (-), R (-) for restriction  UPPER EXTREMITY PROM:  PROM Right  Left   Shoulder flexion    Shoulder abduction    Shoulder internal rotation    Shoulder external rotation    Functional IR    Functional ER    Shoulder extension    Elbow extension    Elbow flexion     (Blank rows = not tested, N = WNL, * = concordant pain with testing)  SHOULDER SPECIAL TESTS:  AC crossover (+) R, (-) L  Drop arm (-)  ER (-)  Painful arc (-)  PATIENT SURVEYS:  FOTO 53 -> 64   PATIENT EDUCATION:  POC, diagnosis, prognosis, HEP, and outcome measures.  Pt educated via explanation, demonstration, and handout (HEP).  Pt confirms understanding verbally.   HOME EXERCISE PROGRAM: Access Code: [email or web address] URL: [email or web address] Date: 01/22/2023 Prepared by: Alphonzo Severance  Exercises - Doorway Pec Stretch at 90 Degrees Abduction  - 1 x daily - 7 x weekly - 3 reps - 45 hold - Shoulder External Rotation and Scapular Retraction with Resistance  - 1 x daily - 7 x weekly - 3 sets - 10 reps - Standing Shoulder Row with Anchored Resistance  - 1 x daily - 7 x weekly - 3 sets - 10  reps - Sidelying Shoulder External Rotation  - 1 x daily - 7 x weekly - 3 sets - 10 reps - Sidelying Shoulder Flexion 15 Degrees  - 1 x daily - 7 x weekly - 3 sets - 10 reps - Sidelying Shoulder Horizontal Abduction  - 1 x daily - 7 x weekly - 3 sets -  10 reps  Treatment priorities   Eval  6/11       General shoulder strengthening working toward gym HEP Lifting/carrying bags        biceps                                [facility] Adult PT Treatment:                                                DATE: 01/29/23  Therapeutic Exercise: nu-step L7 22m while taking subjective and planning session with patient Shoulder rolls Paloff press - 2x ea 3# - 1 high, 1 low Farmer's carry - 10# - 185' ea Biceps curl - 4# - 3x10 ea Standing cable row - 13# - 3x10 Triceps push down - GTB - 3x10 Shoulder ext - GTB - 3x10 Sidelying: ER -> Flexion -> Horizontal abd - 15x ea x3 Towel slide with lift off - 3x10  Consider KB bottom up next visit  ASSESSMENT:  CLINICAL IMPRESSION: Aston tolerated session well with no adverse reaction.  Progressing as expected.  Pts original pain complaint is significantly improved.  She is now having more L biceps pain and we will begin increased attention on this moving forward.  OBJECTIVE IMPAIRMENTS: Pain, shoulder ROM, shoulder strength  ACTIVITY LIMITATIONS: lifting heavy objects, particularly above shoulder height  PERSONAL FACTORS: See medical history and pertinent history   REHAB POTENTIAL: Good  CLINICAL DECISION MAKING: Stable/uncomplicated  EVALUATION COMPLEXITY: Low   GOALS:   SHORT TERM GOALS: Target date: 02/05/23  Judiann will be >75% HEP compliant to improve carryover between sessions and facilitate independent management of condition  Evaluation: ongoing Goal status: INITIAL   LONG TERM GOALS: Target date: 03/05/23  Elyshia will improve FOTO score to 64 as a proxy for functional improvement  Evaluation/Baseline: 53 Goal status:  INITIAL   2.  Gwyn will report confidence in self management of condition at time of discharge with advanced HEP  Evaluation/Baseline: unable to self manage Goal status: INITIAL   3.  Annalaya will improve the following MMTs to >/= 4+/5 to show improvement in strength:     Evaluation/Baseline:  UPPER EXTREMITY MMT:  MMT Right 01/08/2023 Left 01/08/2023  Shoulder flexion 4* 4*  Shoulder abduction (C5) 4* 4*  Shoulder ER 4 4  Shoulder IR 4 4  Middle trapezius 4 4  Lower trapezius 3+ 3+  Shoulder extension    Grip strength    Cervical flexion (C1,C2)    Cervical S/B (C3)    Shoulder shrug (C4)    Elbow flexion (C6)    Elbow ext (C7)    Thumb ext (C8)    Finger abd (T1)    Grossly     (Blank rows = not tested, score listed is out of 5 possible points.  N = WNL, D = diminished, C = clear for gross weakness with myotome testing, * = concordant pain with testing)  Goal status: INITIAL    PLAN: PT FREQUENCY: 1-2x/week  PT DURATION: 8 weeks  PLANNED INTERVENTIONS: Therapeutic exercises, Aquatic therapy, Therapeutic activity, Neuro Muscular re-education, Gait training, Patient/Family education, Joint mobilization, Dry Needling, Electrical stimulation, Spinal mobilization and/or manipulation, Moist heat, Taping, Vasopneumatic device, Ionotophoresis 4mg /ml Dexamethasone, and Manual therapy   Alphonzo Severance PT, DPT 01/29/2023, 9:24 AM

## 2023-02-05 ENCOUNTER — Encounter: Payer: Self-pay | Admitting: Physical Therapy

## 2023-02-05 ENCOUNTER — Ambulatory Visit: Payer: 59 | Admitting: Physical Therapy

## 2023-02-05 DIAGNOSIS — G8929 Other chronic pain: Secondary | ICD-10-CM | POA: Diagnosis not present

## 2023-02-05 DIAGNOSIS — M25511 Pain in right shoulder: Secondary | ICD-10-CM | POA: Diagnosis not present

## 2023-02-05 DIAGNOSIS — R252 Cramp and spasm: Secondary | ICD-10-CM

## 2023-02-05 DIAGNOSIS — M6281 Muscle weakness (generalized): Secondary | ICD-10-CM

## 2023-02-05 DIAGNOSIS — M25512 Pain in left shoulder: Secondary | ICD-10-CM | POA: Diagnosis not present

## 2023-02-05 NOTE — Therapy (Signed)
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 01/18/2022   Sensation of food items getting stuck in upper esophagus to be evaluated first by esophagram and then by EGD with Dr. Leone Payor over the next few weeks.   Hypercholesterolemia    Hypotension    Morphea 2022   Nodule of skin of breast 01/04/2021   Obesity    OSA (obstructive sleep apnea) 12/27/2015   Palpitations    negative cardiac w/u per Dr. Daleen Squibb 2/09   Reflux esophagitis    Sleep apnea    No CPAP   Vaginal discharge 03/02/2020   Past Surgical History:  Procedure Laterality Date   COLONOSCOPY     ECTOPIC PREGNANCY SURGERY     EXPLORATORY LAPAROTOMY  2000   x2 (Dr. 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 07/03/2021   Other personal history of psychological trauma, not elsewhere classified 03/16/2021   Throat irritation 12/16/2019   Hyperlipidemia 11/10/2019   History of recurrent UTI (urinary tract infection) 11/12/2017   Marijuana use 11/12/2017   Hoarseness of voice 10/25/2016   Pain in left shoulder 07/20/2016   Hematuria of unknown etiology 06/01/2013   Tinea pedis 04/14/2007    PCP: Miguel Aschoff, MD  REFERRING PROVIDER: Miguel Aschoff, MD  THERAPY DIAG:  Chronic pain of both shoulders  Muscle weakness  Muscle weakness (generalized)  Cramp and spasm  REFERRING DIAG: Injury of right acromioclavicular joint, sequela [S49.91XS], Chronic left shoulder pain [M25.512, G89.29]   Rationale for Evaluation and Treatment:  Rehabilitation  SUBJECTIVE:  PERTINENT PAST HISTORY:  Bipolar 1, AC joint arthritis         PRECAUTIONS: None  WEIGHT BEARING RESTRICTIONS No  FALLS:  Has patient  fallen in last 6 months? No, Number of falls: 0  MOI/History of condition:  Onset date:  >3 years  SUBJECTIVE STATEMENT  Pt reports that she had minimal pain and has been going back to the gym.  Pain:  Are you having pain? No     OBJECTIVE:    DIAGNOSTIC FINDINGS:  Pt states that her shoulder x-ray showed some misalignment but I am unable to see this  GENERAL OBSERVATION:  Forward head, rounded shoulders     SENSATION:  Light touch: Appears intact   PALPATION: Minimal TTP L>R AC joint  UPPER EXTREMITY AROM:  ROM Right Left   Shoulder flexion 150 150  Shoulder abduction 150 150  Shoulder internal rotation    Shoulder  external rotation    Functional IR T10 T10  Functional ER T2 T2  Shoulder extension    Elbow extension    Elbow flexion     (Blank rows = not tested, N = WNL, * = concordant pain with testing)  UPPER EXTREMITY MMT:  MMT Right  Left   Shoulder flexion 4* 4*  Shoulder abduction (C5) 4* 4*  Shoulder ER 4 4  Shoulder IR 4 4  Middle trapezius 4 4  Lower trapezius 3+ 3+  Shoulder extension    Grip strength    Cervical flexion (C1,C2)    Cervical S/B (C3)    Shoulder shrug (C4)    Elbow flexion (C6)    Elbow ext (C7)    Thumb ext (C8)    Finger abd (T1)    Grossly     (Blank rows = not tested, score listed is out of 5 possible points.  N = WNL, D = diminished, C = clear for gross weakness with myotome testing, * = concordant pain with testing)   MUSCLE LENGTH:    Pec Major: L (-), R (-) for restriction  UPPER EXTREMITY PROM:  PROM Right  Left   Shoulder flexion    Shoulder abduction    Shoulder internal rotation    Shoulder external rotation    Functional IR    Functional ER    Shoulder extension    Elbow extension    Elbow flexion     (Blank rows = not tested, N = WNL, * = concordant pain with testing)  SHOULDER SPECIAL TESTS:  AC crossover (+) R, (-) L  Drop arm (-)  ER (-)  Painful arc (-)  PATIENT SURVEYS:  FOTO 53 -> 64   PATIENT EDUCATION:  POC, diagnosis, prognosis, HEP, and outcome measures.  Pt educated via explanation, demonstration, and handout (HEP).  Pt confirms understanding verbally.   HOME EXERCISE PROGRAM: Access Code: [email or web address] URL: [email or web address] Date: 01/22/2023 Prepared by: Alphonzo Severance  Exercises - Doorway Pec Stretch at 90 Degrees Abduction  - 1 x daily - 7 x weekly - 3 reps - 45 hold - Shoulder External Rotation and Scapular Retraction with Resistance  - 1 x daily - 7 x weekly - 3 sets - 10 reps - Standing Shoulder Row with Anchored Resistance  - 1 x daily - 7 x weekly - 3 sets - 10 reps - Sidelying  Shoulder External Rotation  - 1 x daily - 7 x weekly - 3 sets - 10 reps - Sidelying Shoulder Flexion 15 Degrees  - 1 x daily - 7 x weekly - 3 sets - 10 reps - Sidelying Shoulder Horizontal Abduction  - 1 x daily - 7 x weekly - 3 sets - 10 reps  Treatment priorities   Eval  6/11       General shoulder strengthening working toward gym HEP Lifting/carrying bags        biceps                                [facility] Adult PT Treatment:                                                DATE: 02/05/23  Therapeutic Exercise: nu-step L8 76m while taking subjective and planning session with patient Shoulder rolls Paloff press - x10 GTB - 1 high, 1 low Farmer's carry - 15# - 185' ea Standing cable row - 17# - over and under grip - 2x10 ea Triceps push down - GTB - 3x10 (not today) Shoulder ext - GTB - 3x10 Sidelying: ER -> Flexion -> Horizontal abd - 15x ea x2 - 1# KB bottom up - 5# - 185' 2x ea  ASSESSMENT:  CLINICAL IMPRESSION: Sharan tolerated session well with no adverse reaction.  Progressing as expected.  Will plan on D/C next visit barring any significant change in status.  OBJECTIVE IMPAIRMENTS: Pain, shoulder ROM, shoulder strength  ACTIVITY LIMITATIONS: lifting heavy objects, particularly above shoulder height  PERSONAL FACTORS: See medical history and pertinent history   REHAB POTENTIAL: Good  CLINICAL DECISION MAKING: Stable/uncomplicated  EVALUATION COMPLEXITY: Low   GOALS:   SHORT TERM GOALS: Target date: 02/05/23  Brylee will be >75% HEP compliant to improve carryover between sessions and facilitate independent management of condition  Evaluation: ongoing Goal status: MET   LONG TERM GOALS: Target date: 03/05/23  Tyleen will improve FOTO score to 64 as a proxy for functional improvement  Evaluation/Baseline: 53 Goal status: INITIAL   2.  Kimerly will report confidence in self management of condition at time of discharge with advanced HEP  Evaluation/Baseline:  unable to self manage Goal status: INITIAL   3.  Demecia will improve the following MMTs to >/= 4+/5 to show improvement in strength:     Evaluation/Baseline:  UPPER EXTREMITY MMT:  MMT Right 01/08/2023 Left 01/08/2023  Shoulder flexion 4* 4*  Shoulder abduction (C5) 4* 4*  Shoulder ER 4 4  Shoulder IR 4 4  Middle trapezius 4 4  Lower trapezius 3+ 3+  Shoulder extension    Grip strength    Cervical flexion (C1,C2)    Cervical S/B (C3)    Shoulder shrug (C4)    Elbow flexion (C6)    Elbow ext (C7)    Thumb ext (C8)    Finger abd (T1)    Grossly     (Blank rows = not tested, score listed is out of 5 possible points.  N = WNL, D = diminished, C = clear for gross weakness with myotome testing, * = concordant pain with testing)  Goal status: INITIAL    PLAN: PT FREQUENCY: 1-2x/week  PT DURATION: 8 weeks  PLANNED INTERVENTIONS: Therapeutic exercises, Aquatic therapy, Therapeutic activity, Neuro Muscular re-education, Gait training, Patient/Family education, Joint mobilization, Dry Needling, Electrical stimulation, Spinal mobilization and/or manipulation, Moist heat, Taping, Vasopneumatic device, Ionotophoresis 4mg /ml Dexamethasone, and Manual therapy   Alphonzo Severance PT, DPT 02/05/2023, 9:13 AM

## 2023-02-12 ENCOUNTER — Ambulatory Visit: Payer: 59 | Admitting: Physical Therapy

## 2023-02-12 ENCOUNTER — Emergency Department (HOSPITAL_COMMUNITY)
Admission: EM | Admit: 2023-02-12 | Discharge: 2023-02-12 | Disposition: A | Discharge status: Home / Self Care | Payer: 59 | Attending: Emergency Medicine | Admitting: Emergency Medicine

## 2023-02-12 ENCOUNTER — Emergency Department (HOSPITAL_COMMUNITY): Payer: 59

## 2023-02-12 ENCOUNTER — Encounter: Payer: Self-pay | Admitting: Physical Therapy

## 2023-02-12 DIAGNOSIS — R1032 Left lower quadrant pain: Secondary | ICD-10-CM | POA: Insufficient documentation

## 2023-02-12 DIAGNOSIS — M6281 Muscle weakness (generalized): Secondary | ICD-10-CM

## 2023-02-12 DIAGNOSIS — M25511 Pain in right shoulder: Secondary | ICD-10-CM | POA: Diagnosis not present

## 2023-02-12 DIAGNOSIS — N9489 Other specified conditions associated with female genital organs and menstrual cycle: Secondary | ICD-10-CM

## 2023-02-12 DIAGNOSIS — G8929 Other chronic pain: Secondary | ICD-10-CM

## 2023-02-12 DIAGNOSIS — M25512 Pain in left shoulder: Secondary | ICD-10-CM | POA: Diagnosis not present

## 2023-02-12 DIAGNOSIS — R252 Cramp and spasm: Secondary | ICD-10-CM | POA: Diagnosis not present

## 2023-02-12 DIAGNOSIS — R109 Unspecified abdominal pain: Secondary | ICD-10-CM | POA: Diagnosis not present

## 2023-02-12 LAB — URINALYSIS, ROUTINE W REFLEX MICROSCOPIC
Bilirubin Urine: NEGATIVE
Glucose, UA: NEGATIVE mg/dL
Hgb urine dipstick: NEGATIVE
Ketones, ur: NEGATIVE mg/dL
Nitrite: NEGATIVE
Protein, ur: NEGATIVE mg/dL
Specific Gravity, Urine: 1.026 (ref 1.005–1.030)
pH: 5 (ref 5.0–8.0)

## 2023-02-12 LAB — CBC
HCT: 35.8 % — ABNORMAL LOW (ref 36.0–46.0)
Hemoglobin: 11.6 g/dL — ABNORMAL LOW (ref 12.0–15.0)
MCH: 31.2 pg (ref 26.0–34.0)
MCHC: 32.4 g/dL (ref 30.0–36.0)
MCV: 96.2 fL (ref 80.0–100.0)
Platelets: 195 10*3/uL (ref 150–400)
RBC: 3.72 MIL/uL — ABNORMAL LOW (ref 3.87–5.11)
RDW: 13.3 % (ref 11.5–15.5)
WBC: 4.2 10*3/uL (ref 4.0–10.5)
nRBC: 0 % (ref 0.0–0.2)

## 2023-02-12 LAB — COMPREHENSIVE METABOLIC PANEL
ALT: 14 U/L (ref 0–44)
AST: 13 U/L — ABNORMAL LOW (ref 15–41)
Albumin: 4.3 g/dL (ref 3.5–5.0)
Alkaline Phosphatase: 55 U/L (ref 38–126)
Anion gap: 7 (ref 5–15)
BUN: 15 mg/dL (ref 8–23)
CO2: 27 mmol/L (ref 22–32)
Calcium: 9.2 mg/dL (ref 8.9–10.3)
Chloride: 106 mmol/L (ref 98–111)
Creatinine, Ser: 0.74 mg/dL (ref 0.44–1.00)
GFR, Estimated: >60 mL/min (ref >60)
Glucose, Bld: 96 mg/dL (ref 70–99)
Potassium: 3.9 mmol/L (ref 3.5–5.1)
Sodium: 140 mmol/L (ref 135–145)
Total Bilirubin: 0.6 mg/dL (ref 0.3–1.2)
Total Protein: 7 g/dL (ref 6.5–8.1)

## 2023-02-12 LAB — LIPASE, BLOOD: Lipase: 28 U/L (ref 11–51)

## 2023-02-12 MED ORDER — NAPROXEN 375 MG PO TABS: 375.0000 mg | ORAL_TABLET | Freq: Two times a day (BID) | ORAL | 0 refills | Status: DC

## 2023-02-12 MED ORDER — MORPHINE SULFATE (PF) 4 MG/ML IV SOLN
4.0000 mg | Freq: Once | INTRAVENOUS | Status: AC
  Administered 2023-02-12: 4 mg via INTRAVENOUS
  Filled 2023-02-12: qty 1

## 2023-02-12 MED ORDER — IOHEXOL 300 MG/ML  SOLN
100.0000 mL | Freq: Once | INTRAMUSCULAR | Status: AC | PRN
  Administered 2023-02-12: 100 mL via INTRAVENOUS

## 2023-02-12 NOTE — ED Triage Notes (Signed)
Patient here from home reporting left lower quadrant abd pain that started yesterday.

## 2023-02-12 NOTE — ED Provider Notes (Addendum)
[place] [facility] AT [facility] Provider Note   CSN: [id number] Arrival date & time: 02/12/23  1302     History  Chief Complaint  Patient presents with   Abdominal Pain    Nicole [name] is a 62 y.o. female with bipolar disorder, prediabetes, h/o UTIs, HLD, h/o ectopic pregnancy who presents with left lower quadrant abd pain that started yesterday. Acute L sided abd pain that started last night. Intermittent, but this Am becoming worse/more frequent. Rated 10/10. Sharp. No h/o similar. Has had multiple abd surgeries, including ectopic pregnancy, tubes tied, ex lap for for abd pain, umbilical hernia. Denies f/c, CP, SOB, nausea/vomiting, urinary symptoms, diarrhea/constipation, vaginal symptoms. Last BM was this AM, normal for her, mildly constipated. No hematochezia/melena.     Abdominal Pain      Home Medications Prior to Admission medications   Medication Sig Start Date End Date Taking? Authorizing Provider  albuterol (VENTOLIN HFA) 108 (90 Base) MCG/ACT inhaler Inhale into the lungs every 6 (six) hours as needed for wheezing or shortness of breath.    [provider]  alendronate (FOSAMAX) 70 MG tablet Take 70 mg by mouth once a week. 12/07/22   [provider]  Cholecalciferol 50 MCG (2000 UT) CAPS Take by mouth. Patient not taking: Reported on 11/29/2022 08/29/21   [provider]  diclofenac Sodium (VOLTAREN) 1 % GEL Apply to affected area up to every 6 hours as needed Patient not taking: Reported on 11/29/2022 10/17/22   Fuller Plan, MD  Naftifine HCl (NAFTIN) 2 % CREA Apply 1 drop topically 1 day or 1 dose. 01/17/23   Hyatt, Max T, DPM  SYMBICORT 160-4.5 MCG/ACT inhaler Inhale into the lungs. 11/02/22   [provider]      Allergies    Other    Review of Systems   Review of Systems  Gastrointestinal:  Positive for abdominal pain.   A 10 point review of systems was performed and is negative unless otherwise  reported in HPI.  Physical Exam Updated Vital Signs BP 120/67 (BP Location: Left Arm)   Pulse (!) 59   Temp 98.6 F (37 C) (Oral)   Resp 17   LMP 03/03/2016 (Approximate)   SpO2 99%  Physical Exam General: Normal appearing female, lying in bed.  HEENT: Sclera anicteric, MMM, trachea midline.  Cardiology: RRR, no murmurs/rubs/gallops. BL radial and DP pulses equal bilaterally.  Resp: Normal respiratory rate and effort. CTAB, no wheezes, rhonchi, crackles.  Abd: Soft, mildly tender in LLQ, non-distended. No rebound tenderness or guarding.  GU: Deferred. MSK: No peripheral edema or signs of trauma.  Skin: warm, dry. No rashes or lesions. Back: Mild L CVA tenderness Neuro: A&Ox4, CNs II-XII grossly intact. MAEs. Sensation grossly intact.  Psych: Normal mood and affect.   ED Results / Procedures / Treatments   Labs (all labs ordered are listed, but only abnormal results are displayed) Labs Reviewed  COMPREHENSIVE METABOLIC PANEL - Abnormal; Notable for the following components:      Result Value   AST 13 (*)    All other components within normal limits  CBC - Abnormal; Notable for the following components:   RBC 3.72 (*)    Hemoglobin 11.6 (*)    HCT 35.8 (*)    All other components within normal limits  URINALYSIS, ROUTINE W REFLEX MICROSCOPIC - Abnormal; Notable for the following components:   Leukocytes,Ua TRACE (*)    Bacteria, UA RARE (*)    All  other components within normal limits  LIPASE, BLOOD    EKG None  Radiology CT ABDOMEN PELVIS W CONTRAST  Result Date: 02/12/2023 CLINICAL DATA:  Left lower quadrant pain since yesterday. EXAM: CT ABDOMEN AND PELVIS WITH CONTRAST TECHNIQUE: Multidetector CT imaging of the abdomen and pelvis was performed using the standard protocol following bolus administration of intravenous contrast. RADIATION DOSE REDUCTION: This exam was performed according to the departmental dose-optimization program which includes automated exposure  control, adjustment of the mA and/or kV according to patient size and/or use of iterative reconstruction technique. CONTRAST:  OMNIPAQUE IOHEXOL 300 MG/ML  SOLN COMPARISON:  Esophagram 01/26/2022.  Abdomen pelvis CT 04/13/2021 FINDINGS: Lower chest: There is some linear opacity lung bases likely scar or atelectasis. No pleural effusion. Hepatobiliary: No focal liver abnormality is seen. No gallstones, gallbladder wall thickening, or biliary dilatation. Pancreas: Unremarkable. No pancreatic ductal dilatation or surrounding inflammatory changes. Spleen: Normal in size without focal abnormality. Adrenals/Urinary Tract: Adrenal glands are preserved. No enhancing renal mass or collecting system dilatation. There is small low-attenuation lesion seen along the lower pole left kidney which is too small to completely characterize but likely a benign cysts, Bosniak 2 lesion. No specific imaging follow-up. This also has been present since the prior and unchanged. The ureters have normal course and caliber down to the bladder. Preserved contours of the urinary bladder. Stomach/Bowel: No oral contrast. Normal appendix in the right lower quadrant. Scattered diffuse colonic stool identified. The large bowel has a normal course and caliber. Stomach is collapsed. Small bowel is nondilated. Vascular/Lymphatic: No significant vascular findings are present. No enlarged abdominal or pelvic lymph nodes. Prominent left-sided gonadal veins. Please correlate for any history including pelvic congestion symptoms. Unchanged from previous however. Reproductive: Slightly heterogeneous myometrium of the uterus. No separate adnexal mass. Other: No abdominal wall hernia or abnormality. No abdominopelvic ascites. Musculoskeletal: Scattered degenerative changes of the spine and pelvis. IMPRESSION: No bowel obstruction, free air or free fluid. Scattered stool. Normal appendix. Electronically Signed   By: Karen Kays M.D.   On: 02/12/2023 16:59     Procedures Procedures    Medications Ordered in ED Medications  morphine (PF) 4 MG/ML injection 4 mg (4 mg Intravenous Given 02/12/23 1653)  iohexol (OMNIPAQUE) 300 MG/ML solution 100 mL (100 mLs Intravenous Contrast Given 02/12/23 1627)    ED Course/ Medical Decision Making/ A&P                          Medical Decision Making Amount and/or Complexity of Data Reviewed Labs: ordered. Decision-making details documented in ED Course. Radiology: ordered. Decision-making details documented in ED Course.  Risk Prescription drug management.    This patient presents to the ED for concern of abd pain, this involves an extensive number of treatment options, and is a complaint that carries with it a high risk of complications and morbidity.  I considered the following differential and admission for this acute, potentially life threatening condition. Pt is overall very stable and well-appearing.  MDM:    For DDX for abdominal pain includes but is not limited to:  Abdominal exam without peritoneal signs. No evidence of acute abdomen at this time. Concern for diverticulitis, pyelonephritis, nephrolithiasis/ureterolithiasis, UTI. Low suspicion for acute hepatobiliary disease (including acute cholecystitis or cholangitis), acute pancreatitis (neg lipase), viscus perforation. Also consider ovarian torsion with L lower intermittent pain, no h/o ovarian cysts. No vaginal symptoms to indicate PID/TOA. Will began eval w/ CT abd pelvis.  Clinical Course as of 02/12/23 1718  Tue Feb 12, 2023  1513 Comprehensive metabolic panel(!) wnl [HN]  1514 Lipase: 28 neg [HN]  1514 Urinalysis, Routine w reflex microscopic -Urine, Clean Catch(!) Unremarkable in the context of this patient's presentation  [HN]  1514 WBC: 4.2 No leukocytosis   [HN]  1716 CT ABDOMEN PELVIS W CONTRAST CT scan with no acute abnormality however does read prominent gonadal veins with no adnexal mass. "Correlate for pelvic  congestion symptoms." Will obtain gyn ultrasound for further evaluation. [HN]    Clinical Course User Index [HN] Loetta Rough, MD    Labs: I Ordered, and personally interpreted labs.  The pertinent results include:  those listed aobbve  Imaging Studies ordered: I ordered imaging studies including CT abd pelvis w contrast Pending at signout  Additional history obtained from chart review.  Reevaluation: After the interventions noted above, I reevaluated the patient and found that they have :improved  Social Determinants of Health: Patient lives independently   Disposition:  Patient is signed out to the oncoming ED physician Dr. Posey Rea who is made aware of her history, presentation, exam, workup, and plan. Plan is to obtain gyn [place] w/ torsion r/o and reassess.    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 01/18/2022   Sensation of food items getting stuck in upper esophagus to be evaluated first by esophagram and then by EGD with Dr. Leone Payor over the next few weeks.   Hypercholesterolemia    Hypotension    Morphea 2022   Nodule of skin of breast 01/04/2021   Obesity    OSA (obstructive sleep apnea) 12/27/2015   Palpitations    negative cardiac w/u per Dr. Daleen Squibb 2/09   Reflux esophagitis    Sleep apnea    No CPAP   Vaginal discharge 03/02/2020     Medicines Meds ordered this encounter  Medications   morphine (PF) 4 MG/ML injection 4 mg   iohexol (OMNIPAQUE) 300 MG/ML solution 100 mL    I have reviewed the  patients home medicines and have made adjustments as needed  Problem List / ED Course: Problem List Items Addressed This Visit   None      Loetta Rough, MD 02/12/23 1718

## 2023-02-12 NOTE — ED Notes (Signed)
Pt resting comfortably

## 2023-02-12 NOTE — Discharge Instructions (Addendum)
Overview What is pelvic congestion syndrome? Pelvic congestion syndrome -- also called pelvic venous insufficiency -- is a chronic pain condition associated with blood flow problems in your pelvic veins. "Chronic" means pain lasting longer than 6 months that isn't associated with either your menstrual cycle or pregnancy. The pelvic pain associated with PCS often involves faulty veins in your ovaries and pelvis. The veins dilate (widen) and may become twisted and overfilled with blood. As a result, blood may pool in your pelvis and feel painful. Who does it affect? You're more likely to have PCS if you're between ages 11 to 10 and have given birth more than once. Other risk factors include: Varicose veins. A family history of varicose veins. Polycystic ovarian syndrome (PCOS). It's rare for people who've gone through menopause to have pelvic congestion syndrome. How common is pelvic congestion syndrome? Approximately 40% of gynecological visits involve chronic pelvic pain complaints. It's estimated that up to 30% of these are likely related to pelvic congestion syndrome.  Symptoms and Causes What are the symptoms of pelvic congestion syndrome? The pelvic pain you experience with PCS may feel dull, achy or heavy. Less commonly, the pain may feel sharp and intense. You may notice the pain on the left side only (most common), on the right side of your body or both sides. Often, pain starts during or soon after pregnancy. It may worsen with future pregnancies. The pain associated with PCS may feel worse: At the end of the day. Before and during your period. During intercourse and afterward (dyspareunia). When you stand or sit for long periods (but it feels better when you lie down). Other symptoms include: Varicose veins in your pelvis, butt, thighs, vulva and vagina. Frequent bouts of diarrhea and constipation (irritable bowel). Peeing accidentally from laughing, coughing or other movements  that stress your bladder (stress incontinence). Pain when you pee (dysuria). What causes pelvic congestion syndrome? Researchers don't know what causes pelvic congestion syndrome. Still, problems with blood flow in your ovarian veins and the veins in your pelvis play a role. Normally, blood flows upward from your pelvic veins and toward your heart via the veins in your ovaries. Structures called valves in your veins prevent blood from flowing backward. Backflow of blood is called reflux. With PCS, veins are so dilated (widened) that the valves don't prevent reflux. Blood flows backward through the veins, causing them to become overly filled and twisted. Blood pools in your pelvic veins and causes pain. The pain may result from the stretching your stressed veins must do to contain the extra blood. It's also possible that the misshapen veins touch nearby nerves in your pelvis, triggering pain. There are a few possible reasons that veins become structurally unsound with PCS: Pregnancy: One Nelva Bush is that PCS is related to blood vessel changes during pregnancy. Blood vessels expand 50% of their normal size during pregnancy to manage the extra blood flow needed to support a developing fetus. These changes may cause long-term damage to your blood vessel walls, causing veins to remain dilated even after you've had your baby. Estrogen: Pelvic congestion syndrome is uncommon among people who've experienced menopause, suggesting that estrogen may be involved. Estrogen levels decrease after menopause. Estrogen's effects on your blood vessel walls may make you more susceptible to defects that lead to PCS. PCS likely results from various interrelated factors.  Diagnosis and Tests How is pelvic congestion syndrome diagnosed? Your healthcare provider will ask about your medical history and symptoms. You'll likely have a physical  exam that includes a pelvic exam. Your provider will check for tenderness in your ovaries,  cervix and uterus to try and pinpoint where the pain originates. Imaging can help your provider rule out other conditions that cause chronic pelvic pain and identify any irregularities in your veins potentially related to PCS. Imaging procedures include: Ultrasound: Your provider will likely order an ultrasound first. An ultrasound can show vein dilation. It can help your provider spot other abnormalities that may be causing your pain. The Doppler feature on the ultrasound can show whether your blood is flowing backward. MRI or CT scan: An MRI and a CT scan can show details in your veins that an ultrasound may miss. They can show twisted veins and vein dilation in your ovaries and pelvis in greater detail than an ultrasound. They can also show irregular growths in your pelvis that may indicate other causes of chronic pain, like endometriosis. Pelvic venography: Pelvic venography is the gold standard for PCS imaging. It's more invasive, though, so it's generally used when other imaging doesn't provide enough evidence for a diagnosis. Your provider may also use pelvic venography to prepare for a procedure to operate on your veins. During pelvic venography, your provider inserts a small tube called a catheter into a vein in either your neck or your groin. They use an X-ray to guide the positioning of the catheter so that it goes into your ovarian veins on the right and left sides of your body. A safe dye is injected into the vein, making your veins more visible on the X-ray. Venography shows where the dilated and twisted veins are, how the blood is flowing and where the blood is pooling. Laparoscopy: Laparoscopy can show if your veins are dilated. Still, it may not be as helpful as other procedures for understanding problems with blood flow related to PCS. Your provider may perform a laparoscopy to rule out conditions other than PCS that may be causing your pelvic pain. During a laparoscopy, your provider performs  surgery through tiny cuts in your abdomen that allow them to view your reproductive organs. Diagnosing pelvic congestion syndrome can be challenging because many people without pelvic pain have stressed veins similar to those who do experience pain. Imaging studies have shown that people with chronic pelvic pain and those without it may have distorted blood vessels and backflow of blood. Even if imaging shows that your veins are dilated, you don't need treatment unless you're experiencing chronic pain. Pelvic congestion as a cause of pelvic pain is a diagnosis that's generally given after all other causes of pelvic pain have been excluded.  Management and Treatment Can pelvic congestion syndrome be cured? There isn't a cure for pelvic congestion syndrome, but medications and medical procedures can help ease your symptoms. What are the treatments for pelvic congestion syndrome? You may see different healthcare providers for treatment, including a gynecologist, gastroenterology (GI) specialist, pain specialist and physical therapist. Your provider or care team will likely recommend medications to manage your symptoms before recommending surgery. Medications Medications that suppress estrogen production can lessen the pain associated with pelvic congestion syndrome. They include: Medroxyprogesterone acetate (Depo-Provera) Etonogestrel implant (Implanon). GnRH agonists. Goserelin. Procedures Your provider may perform a medical procedure if medications don't ease your symptoms. These include: Ovarian vein embolization or sclerotherapy: During this procedure, your provider blocks the blood vessels causing the blood to flow backward so that blood doesn't pool. First, they place a catheter into the faulty ovarian vein and pelvic veins. Then, they  send chemicals through the catheter to irritate or inflame the veins. Tiny metal coils, glue or foam are also inserted into these veins to prevent  reflux. Laparoscopy: Your provider may perform a laparoscopy to tie off the veins, preventing the backflow of blood. Bilateral salpingo-oophorectomy: Your provider may remove your pelvic organs (uterus, fallopian tubes and ovaries) if you've finished birthing children or don't wish to give birth. This surgery is rare for PCS.  Prevention How can I prevent pelvic congestion syndrome? Pelvic congestion syndrome isn't preventable.  Outlook / Prognosis Is pelvic congestion syndrome life-threatening? Pelvic congestion syndrome isn't life-threatening. Depending on the severity of your pain, it can disrupt your life and keep you from activities you enjoy. See your healthcare provider if your pelvic pain is compromising your quality of life. What can I expect if I have pelvic congestion syndrome? Although there isn't a standard treatment for pelvic congestion syndrome, the outcomes associated with interventions to provide pain relief are excellent. For instance, 75% of people who receive ovarian vein embolization experience pain relief. Recurrence happens in only 5% of these cases.  Living With What questions should I ask my doctor? Will I need to see multiple specialists to receive a diagnosis or treatment? What medications would you recommend to relieve symptoms? At what point would you recommend a procedure to improve my symptoms? Which procedure would you recommend? What are the potential risks associated with this procedure?
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
## 2023-02-13 ENCOUNTER — Other Ambulatory Visit: Payer: Self-pay

## 2023-02-13 ENCOUNTER — Encounter: Payer: Self-pay | Admitting: Obstetrics and Gynecology

## 2023-02-13 ENCOUNTER — Ambulatory Visit: Payer: Self-pay | Admitting: Clinical

## 2023-02-13 ENCOUNTER — Ambulatory Visit (INDEPENDENT_AMBULATORY_CARE_PROVIDER_SITE_OTHER): Payer: 59 | Admitting: Obstetrics and Gynecology

## 2023-02-13 VITALS — BP 113/73 | HR 70 | Ht 65.0 in | Wt 164.0 lb

## 2023-02-13 DIAGNOSIS — Z639 Problem related to primary support group, unspecified: Secondary | ICD-10-CM

## 2023-02-13 DIAGNOSIS — M791 Myalgia, unspecified site: Secondary | ICD-10-CM | POA: Diagnosis not present

## 2023-02-13 DIAGNOSIS — Z658 Other specified problems related to psychosocial circumstances: Secondary | ICD-10-CM
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
## 2023-02-13 NOTE — Patient Instructions (Signed)
[facility] [address] [place], [place] [address] [phone] (main office) [phone] (Floyce Bujak's office)  [facility]  [address], [place], [place] [address] [phone] [email or web address]  [facility]   [place]:  [address], 2nd floor, [place], [place] [address] [phone]  Main line [phone]  [place] location **Parking is available in the [address] parking deck.  [place]:  [address], [place], [place] [address] [phone] Main line [phone]  [place] location  **Located on the backside of the [place]. Limited parking is available off of [address]  Walk-in hours: Monday -Friday 8:30am-4:30pm  [email or web address]    If immediate emergency, call 9-1-1, or [facility] 24 hour crisis hotline at: 336-273-7273  [facility] [address], [place], [place] [address] [phone] **temporary housing for victims of domestic violence  [place]:  [facility] [employer]  [address], [place], [place] [address] [phone]  http://helpincorporated.org  Monday-Friday, 8:30am-5:00pm     
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
## 2023-02-27 NOTE — BH Specialist Note (Deleted)
Integrated Behavioral Health via Telemedicine Visit  02/27/2023 Nicole [name] [id number]  Number of Integrated Behavioral Health Clinician visits: No data recorded Session Start time: No data recorded  Session End time: No data recorded Total time in minutes: No data recorded  Referring Provider: *** Patient/Family location: *** [facility] Provider location: *** All persons participating in visit: *** Types of Service: {CHL AMB TYPE OF SERVICE:[phone]}  I connected with Nelda Bucks Quackenbush and/or Nelda Bucks Steinbach's {family members:20773} via  Telephone or Video Enabled Telemedicine Application  (Video is Caregility application) and verified that I am speaking with the correct person using two identifiers. Discussed confidentiality: {YES/NO:21197}  I discussed the limitations of telemedicine and the availability of in person appointments.  Discussed there is a possibility of technology failure and discussed alternative modes of communication if that failure occurs.  I discussed that engaging in this telemedicine visit, they consent to the provision of behavioral healthcare and the services will be billed under their insurance.  Patient and/or legal guardian expressed understanding and consented to Telemedicine visit: {YES/NO:21197}  Presenting Concerns: Patient and/or family reports the following symptoms/concerns: *** Duration of problem: ***; Severity of problem: {Mild/Moderate/Severe:20260}  Patient and/or Family's Strengths/Protective Factors: {CHL AMB BH PROTECTIVE FACTORS:[phone]}  Goals Addressed: Patient will:  Reduce symptoms of: {IBH Symptoms:21014056}   Increase knowledge and/or ability of: {IBH Patient Tools:21014057}   Demonstrate ability to: {IBH Goals:21014053}  Progress towards Goals: {CHL AMB BH PROGRESS TOWARDS GOALS:[phone]}  Interventions: Interventions utilized:  {IBH Interventions:21014054} Standardized Assessments completed: {IBH Screening  Tools:21014051}  Patient and/or Family Response: ***  Assessment: Patient currently experiencing ***.   Patient may benefit from ***.  Plan: Follow up with behavioral health clinician on : *** Behavioral recommendations: *** Referral(s): {IBH Referrals:21014055}  I discussed the assessment and treatment plan with the patient and/or parent/guardian. They were provided an opportunity to ask questions and all were answered. They agreed with the plan and demonstrated an understanding of the instructions.   They were advised to call back or seek an in-person evaluation if the symptoms worsen or if the condition fails to improve as anticipated.  Valetta Close , LCSW
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
## 2023-03-13 ENCOUNTER — Institutional Professional Consult (permissible substitution): Payer: 59

## 2023-03-18 ENCOUNTER — Ambulatory Visit (INDEPENDENT_AMBULATORY_CARE_PROVIDER_SITE_OTHER): Payer: 59 | Admitting: Clinical

## 2023-03-18 ENCOUNTER — Other Ambulatory Visit: Payer: Self-pay

## 2023-03-18 DIAGNOSIS — F4322 Adjustment disorder with anxiety: Secondary | ICD-10-CM | POA: Diagnosis not present

## 2023-03-18 DIAGNOSIS — Z658 Other specified problems related to psychosocial circumstances: Secondary | ICD-10-CM

## 2023-03-18 NOTE — Patient Instructions (Signed)
[facility] [address] [place], [name] [address] [phone] (main office) [phone] (Jamie's office)   

## 2023-03-19 NOTE — BH Specialist Note (Signed)
Integrated Behavioral Health Follow Up In-Person Visit  MRN: [id number] Name: Nicole [name]  Number of Integrated Behavioral Health Clinician visits: 2- Second Visit  Session Start time: [phone]   Session End time: 1033  Total time in minutes: 37   Types of Service: Individual psychotherapy  Interpretor:No. Interpretor Name and Language: n/a  Subjective: Nicole [name] is a 62 y.o. female accompanied by  n/a Patient was referred by Lorriane Shire, MD for life stress. Patient reports the following symptoms/concerns: Anxiety regarding finding new housing quickly due to severe mold issue in the home as well as to get away from person living in home who will hopefully be incarcerated soon; holding onto hope he will leave soon helps to cope.  Duration of problem: Ongoing; Severity of problem: moderate  Objective: Mood: Anxious and Affect: Appropriate Risk of harm to self or others: No plan to harm self or others  Life Context: Family and Social: Pt lives by herself with partner (comes and goes); supportive friends in life School/Work: - Self-Care: Keeping positive and practical outlook; time with friends and time in nature for self-care Life Changes: Ongoing complicated relationship with partner  Patient and/or Family's Strengths/Protective Factors: Social connections, Sense of purpose, and Physical Health (exercise, healthy diet, medication compliance, etc.)  Goals Addressed: Patient will:  Reduce symptoms of: anxiety and stress   Increase knowledge and/or ability of: stress reduction   Demonstrate ability to: Increase healthy adjustment to current life circumstances and Increase motivation to adhere to plan of care  Progress towards Goals: Ongoing  Interventions: Interventions utilized:  Link to [employer] and Supportive Reflection Standardized Assessments completed: Not Needed  Patient and/or Family Response: Patient agrees with treatment plan.   Patient Centered  Plan: Patient is on the following Treatment Plan(s): IBH Assessment: Patient currently experiencing Adjustment disorder with anxiety; Psychosocial stress.   Patient may benefit from continued therapeutic intervention  .  Plan: Follow up with behavioral health clinician on : Two weeks Behavioral recommendations:  Sport and exercise psychologist today -Consider additional housing resources on After Visit Summary; use as needed -Continue spending time away from home with friends/in nature for ongoing self-care while waiting for time to move  Referral(s): Integrated Art gallery manager (In Clinic) and [employer] Resources:  Housing  [address] [place], [place]     03/18/2023   10:07 AM 02/13/2023    2:12 PM 12/27/2022    8:40 AM 09/25/2022    4:37 PM 09/06/2022   11:28 AM  Depression screen PHQ 2/9  Decreased Interest 1 1 0 0 1  Down, Depressed, Hopeless 0 0 0 0 0  PHQ - 2 Score 1 1 0 0 1  Altered sleeping 1 1  1 1   Tired, decreased energy 1 1  1 1   Change in appetite 1 0  0 1  Feeling bad or failure about yourself  0 1  0 0  Trouble concentrating 1 1  1  0  Moving slowly or fidgety/restless 0 0  0 0  Suicidal thoughts 0 0  0 0  PHQ-9 Score 5 5  3 4   Difficult doing work/chores    Somewhat difficult       03/18/2023   10:07 AM 02/13/2023    2:12 PM 08/22/2022    4:23 PM 08/25/2021   11:00 AM  GAD 7 : Generalized Anxiety Score  Nervous, Anxious, on Edge 1 1 1 1   Control/stop worrying 0 1 1 0  Worry too much - different things 1 1  1 0  Trouble relaxing 0 0 1 1  Restless 0 0 0 0  Easily annoyed or irritable 1 1 1 1   Afraid - awful might happen 1 0 0 0  Total GAD 7 Score 4 4 5  3

## 2023-04-01 ENCOUNTER — Other Ambulatory Visit (HOSPITAL_COMMUNITY)
Admission: RE | Admit: 2023-04-01 | Discharge: 2023-04-01 | Disposition: A | Discharge status: Home / Self Care | Payer: 59 | Source: Ambulatory Visit | Attending: Family Medicine | Admitting: Family Medicine

## 2023-04-01 ENCOUNTER — Ambulatory Visit (INDEPENDENT_AMBULATORY_CARE_PROVIDER_SITE_OTHER): Payer: 59 | Admitting: Clinical

## 2023-04-01 ENCOUNTER — Other Ambulatory Visit: Payer: Self-pay

## 2023-04-01 ENCOUNTER — Ambulatory Visit (INDEPENDENT_AMBULATORY_CARE_PROVIDER_SITE_OTHER): Payer: 59

## 2023-04-01 VITALS — BP 124/77 | HR 68 | Wt 172.0 lb

## 2023-04-01 DIAGNOSIS — F4322 Adjustment disorder with anxiety: Secondary | ICD-10-CM

## 2023-04-01 DIAGNOSIS — N898 Other specified noninflammatory disorders of vagina: Secondary | ICD-10-CM | POA: Diagnosis present

## 2023-04-01 DIAGNOSIS — R1030 Lower abdominal pain, unspecified: Secondary | ICD-10-CM

## 2023-04-01 DIAGNOSIS — Z658 Other specified problems related to psychosocial circumstances: Secondary | ICD-10-CM

## 2023-04-01 NOTE — Patient Instructions (Signed)
 Housing Resources                    [employer] (serves [place], [place], [place], [place], [place], [place], [place], [place], [place], [place], [place], [place], and [place] counties) [address] [place], [place] [address] [phone] DeveloperU.ch  **Rental assistance, Home Rehabilitation,Weatherization Assistance Program, Chief Financial Officer, Housing Voucher Program  [employer] for Housing and [employer] Studies: Proofreader Resources to residents of [place], [place], and [place] Make sure you have your documents ready, including:  (Household income verification: 2 months pay stubs, unemployment/social security award letter, statement of no income for all household members over 37) Photo ID for all household members over 18 Utility Bill/Rent Ledger/Lease: must show past due amount for utilities/rent, or the rental agreement if rent is current 2. Start your application online or by paper (in [place] or [place]) at:     [email or web address]  3. Once you have completed the online application, you will get an email confirmation message from the county. Expect to hear back by phone or email at least 6-10 weeks from submitting your application.  4. While you wait:  Call [phone] to check in on your application Let your landlord know that you've applied. Your landlord will be asked to submit documents (W-9) during this application process. Payments will be made directly to the landlord/property management company and utility company Rent or utility assistance for [employer], [place], and [place] Apply at [email or web address] Questions? Call or email Renee at [phone] or [email or web address]   Eviction Mediation Program: The HOPE Program Https://www.rebuild.BedroomRental.[email or web address].cy HOPE Progam serves low-income renters in [address] [place] counties, defined as less than or equal to 80% of the area median income for the county where the renter lives. In the following 12 counties, you should apply to your local rent and utility assistance program INSTEAD OF the HOPE Program: [place], [place], [place], [place], [place], [place], [place], [place], [place], [place], [place], [place]  If you live outside of [place], contact HOPE call center at [phone] to talk to a Program Representative Monday-Friday, 8am-5pm Note that Native American tribes also received federal funding for rent and utility assistance programs. Recognized members of the following tribes will be served by programs managed by tribal governments, including: [place], [place], [place], [place] of [place] and [facility] Eviction Mediation Coordinator, [place], [phone] [email or web address] .[employer], [place], [phone] [email or web address]   Housing Resources [facility] [place] [address] [place], [place] [address] [phone] www.gha-Park.[facility] [address] Tawny Hopping [place], [place] [address] [phone] PhoneCaptions.ch **Programs include: Hospital doctor and Housing Counseling, Healthy Radiographer, therapeutic, Homeless Prevention and Housing Assistance  Government [facility] [address], [place], [place] [address] [phone] www.PaintingEmporium.co.za **housing applications/recertification; tax payment relief/exemption under specific qualifications  [facility] [address], [place], [place] [address] www.onlinegreensboro.com/~maryshouse **transitional housing for women in recovery who have minor children or are pregnant  [facility] [address] [place], [place], [place] [address] https://johnson-smith.net/  **emergency shelter and  support services for families facing homelessness  [facility] [address] [place], [place] [address] [phone] www.youthfocus.org **transitional housing to pregnant women; emergency housing for youth who have run away, are experiencing a family crisis, are victims of abuse or neglect, or are homeless  [facility] [address], [place], [place] [address] [phone]  ircgso.org **Drop-in center for people experiencing homelessness; overnight warming center when temperature is 25 degrees or below  [facility] [employer] [address] [place], [place], [place] [address] [phone] https://reentrystaffingagency.org/ **help with affordable housing to people experiencing homelessness or unemployment due to incarceration  [facility] [address] [place], [place] [address] [phone] www.greensborourbanministry.org  **emergency and transitional housing, rent/mortgage assistance, utility assistance  [facility] [address], [name], [place] [address] [phone] www.salvationarmyofgreensboro.org **emergency and transitional housing  [employer] for [employer] [address], [place], [place] [address] [phone] Www.habitatgreensboro.[facility]   [employer] [address], [place], [place] [address] [phone] https://chshousing.org **Home Ownership/Affordable Housing Program and [facility]  [facility] [address] [place] [address], [place], [place] [address] [phone] PaidValue.com.cy **home buyer education courses, foreclosure prevention  [facility] [address] [place], [place] [address] [phone] DefMagazine.is **Environmental Exposure Assessment (investigation of homes where either children or pregnant women with a confirmed elevated blood lead level  reside)  [facility] of [facility] [address] [place], [place] [address] [phone] ShowReturn.ca **Home Expense Assistance/Repairs Program; offers home accessibility updates, such as ramps or bars in the bathroom  [facility] [address], [place], [place] [address] [phone] https://www.self-help.org/locations/[place]-branch **Offers credit-building and banking services to people unable to use traditional banking   Housing Resources [facility]  Housing Authority- [place] of [facility] [address] Cinda Quest [place], [place] [address] [phone] ChatRepair.pl   [facility] [address], [place], [place] [address] [phone]  WrestlingMonthly.pl **housing applications/recertification, emergency and transitional housing  Open [employer] of [employer] [address], [place], [place] [address] [phone] www.odm-hp.org  **emergency and permanent housing; rent/mortgage payment assistance  Habitat for [employer], [place] and [place] [address] [place], [place] [address] [phone] https://russell-walls.com/  [facility] Services of the [place], [employer] [address] [place], [place], [place] [address] www.familyservice-piedmont.org **emergency shelter for victims of domestic violence and sexual assault  [facility] [address], [place], [place] [address] [phone] www.senior-resources-[place]ilford.org **Home expense assistance/repairs for older adults  Housing Resources [facility] of [place] [address] [place], [place] [address] [phone] http://cohen-reilly.biz/  **May offer help with minor housing repairs  [facility] [address], [place], [place] [address] [phone] **emergency housing for victims of domestic  violence  Housing Resources [place], [place], [place] [facility])  [facility] [address], [place], [place] [address] [phone] www.newrha.[facility] [address] [place], [place] [address] [phone]  [facility] [address], [place], [place] [address] [phone] www.co.[place].us **Housing applications/recertification; tax payment relief/exemption w specific qualifications  [facility] Help for Homeless [address] [place], [place] [address] [phone] Http://www.rchelpforhomeless.org/HOME_PAGE.html  HELP, Incorporated [facility] [address], [place], [place] [address] [phone] 24 Hour Crisis Line [phone] Hours of Operation: Monday-Friday, 8:30am-5:00pm http://helpincorporated.org **Includes emergency housing for victims of domestic violence

## 2023-04-01 NOTE — Progress Notes (Signed)
Nicole [name] is here with concern of vaginal odor. Describes odor as "rotten." Also reports she had lower abdominal pain. Odor and pain were present 8/8-03/29/23. Used a vinegar douche on 03/29/23. Noticed on 03/30/23 that odor was no longer present. Concerned she may have had an injury during intercourse that caused gangrene; she read online that this was a possibility. Reassured her that if any severe infection was present we would expect ongoing odor, pain, and presence of a fever. Pt has noticed correlation between presence of semen in vagina and odor; unable to use condom during intercourse due to partner having ED. Expects to be in relationship only for a few more weeks.  Self swab instructions given and specimen obtained. Explained patient will be contacted with any abnormal results.   Patient requests lubricant, which was given during appt; unable to purchase until she is paid in 2 weeks. Patient escorted to [facility] to shop.  Marjo Bicker, RN 04/01/2023  11:21 AM

## 2023-04-08 ENCOUNTER — Inpatient Hospital Stay: Admission: RE | Admit: 2023-04-08 | Payer: 59 | Source: Ambulatory Visit

## 2023-04-08 NOTE — Therapy (Deleted)
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 01/18/2022   Sensation of food items getting stuck in upper esophagus to be evaluated first by esophagram and then by EGD with Dr. Leone Payor over the next few weeks.   Hypercholesterolemia    Hypotension    Morphea 2022   Nodule of skin of breast 01/04/2021   Obesity    OSA (obstructive sleep apnea) 12/27/2015   Palpitations    negative cardiac w/u per Dr. Daleen Squibb 2/09   Reflux esophagitis    Sleep apnea    No CPAP   Vaginal discharge 03/02/2020   Past Surgical History:  Procedure Laterality Date   COLONOSCOPY     ECTOPIC PREGNANCY SURGERY     EXPLORATORY LAPAROTOMY  2000   x2 (Dr. 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 07/03/2021   Other personal history of psychological trauma, not elsewhere classified 03/16/2021   Throat irritation 12/16/2019   Hyperlipidemia 11/10/2019   History of recurrent UTI (urinary tract infection) 11/12/2017   Marijuana use 11/12/2017   Hoarseness of voice 10/25/2016   Pain in left shoulder 07/20/2016   Hematuria of unknown etiology 06/01/2013   Tinea pedis 04/14/2007    PCP: Miguel Aschoff, MD  REFERRING PROVIDER: Lorriane Shire, MD   REFERRING DIAG: M79.10 (ICD-10-CM) - Myalgia   THERAPY DIAG:  No diagnosis found.  Rationale for Evaluation and Treatment: Rehabilitation  ONSET DATE: ***  SUBJECTIVE:  SUBJECTIVE STATEMENT: LLQ  pain; Pain able to reproduced on examination with palpation of inguinal ligament  Fluid intake: {Yes/No:304960894}   PAIN:  Are you having pain? {yes/no:20286} NPRS scale: ***/10 Pain location: {pelvic pain location:27098}  Pain type: {type:313116} Pain description: {PAIN DESCRIPTION:21022940}   Aggravating factors: *** Relieving factors: ***  PRECAUTIONS: None  RED FLAGS: {PT Red Flags:29287}   WEIGHT BEARING RESTRICTIONS: {Yes ***/No:24003}  FALLS:  Has patient fallen in last 6 months? {fallsyesno:27318}  LIVING ENVIRONMENT: Lives with: {OPRC lives with:25569::"lives with their family"} Lives in: {Lives in:[address]} Stairs: {opstairs:27293} Has following equipment at home: {Assistive devices:23999}  OCCUPATION: ***  PLOF: {PLOF:24004}  PATIENT GOALS: ***  PERTINENT HISTORY:  bipoloar Sexual abuse: {Yes/No:304960894}  BOWEL MOVEMENT: Pain with bowel movement: {yes/no:20286} Type of bowel movement:{PT BM type:27100} Fully  empty rectum: {Yes/No:304960894} Leakage: {Yes/No:304960894} Pads: {Yes/No:304960894} Fiber supplement: {Yes/No:304960894}  URINATION: Pain with urination: {yes/no:20286} Fully empty bladder: {Yes/No:304960894}push to pee and when bearing down Stream: {PT urination:27102} Urgency: {Yes/No:304960894} Frequency: *** Leakage: {PT leakage:27103} Pads: {Yes/No:304960894}  INTERCOURSE:pain Pain with intercourse: {pain with intercourse PA:27099} Ability to have vaginal penetration:  {Yes/No:304960894} [place]: *** Marinoff Scale: ***/3  PREGNANCY: Vaginal deliveries *** Tearing {Yes***/No:304960894} C-section deliveries *** Currently pregnant {Yes***/No:304960894}  PROLAPSE: {PT prolapse:27101}   OBJECTIVE:   DIAGNOSTIC FINDINGS:  ***  PATIENT SURVEYS:  {rehab surveys:24030}  PFIQ-7 ***  COGNITION: Overall cognitive status: {cognition:24006}     SENSATION: Light touch: {intact/deficits:24005} Proprioception: {intact/deficits:24005}  MUSCLE LENGTH: Hamstrings: Right *** deg; Left *** deg Thomas test: Right *** deg; Left *** deg  LUMBAR SPECIAL TESTS:  {lumbar special test:25242}  FUNCTIONAL TESTS:  {Functional tests:24029}  GAIT: Distance walked: *** Assistive device utilized: {Assistive devices:23999} Level of assistance: {Levels of assistance:24026} Comments: ***  POSTURE: {posture:25561}  PELVIC ALIGNMENT:  LUMBARAROM/PROM:  A/PROM A/PROM  eval  Flexion   Extension   Right lateral flexion   Left lateral flexion   Right rotation   Left rotation    (Blank rows = not tested)  LOWER EXTREMITY ROM:  {AROM/PROM:27142} ROM Right eval Left eval  Hip flexion    Hip extension    Hip abduction    Hip adduction    Hip internal rotation    Hip external rotation    Knee flexion    Knee extension    Ankle dorsiflexion    Ankle plantarflexion    Ankle inversion    Ankle eversion     (Blank rows = not tested)  LOWER EXTREMITY MMT:  MMT  Right eval Left eval  Hip flexion    Hip extension    Hip abduction    Hip adduction    Hip internal rotation    Hip external rotation    Knee flexion    Knee extension    Ankle dorsiflexion    Ankle plantarflexion    Ankle inversion    Ankle eversion     PALPATION:   General  ***                External Perineal Exam ***                             Internal Pelvic Floor ***  Patient confirms identification and approves PT to assess internal pelvic floor and treatment {yes/no:20286}  PELVIC MMT:   MMT eval  Vaginal   Internal Anal Sphincter   External Anal Sphincter   Puborectalis   Diastasis Recti   (Blank rows =  not tested)        TONE: ***  PROLAPSE: ***  TODAY'S TREATMENT:                                                                                                                              DATE: ***  EVAL ***   PATIENT EDUCATION:  Education details: *** Person educated: {Person educated:25204} Education method: {Education Method:25205} Education comprehension: {Education Comprehension:25206}  HOME EXERCISE PROGRAM: ***  ASSESSMENT:  CLINICAL IMPRESSION: Patient is a *** y.o. *** who was seen today for physical therapy evaluation and treatment for ***.   OBJECTIVE IMPAIRMENTS: {opptimpairments:25111}.   ACTIVITY LIMITATIONS: {activitylimitations:27494}  PARTICIPATION LIMITATIONS: {participationrestrictions:25113}  PERSONAL FACTORS: {Personal factors:25162} are also affecting patient's functional outcome.   REHAB POTENTIAL: {rehabpotential:25112}  CLINICAL DECISION MAKING: {clinical decision making:25114}  EVALUATION COMPLEXITY: {Evaluation complexity:25115}   GOALS: Goals reviewed with patient? {yes/no:20286}  SHORT TERM GOALS: Target date: ***  *** Baseline: Goal status: INITIAL  2.  *** Baseline:  Goal status: INITIAL  3.  *** Baseline:  Goal status: INITIAL  4.  *** Baseline:  Goal status: INITIAL  5.   *** Baseline:  Goal status: INITIAL  6.  *** Baseline:  Goal status: INITIAL  LONG TERM GOALS: Target date: ***  *** Baseline:  Goal status: INITIAL  2.  *** Baseline:  Goal status: INITIAL  3.  *** Baseline:  Goal status: INITIAL  4.  *** Baseline:  Goal status: INITIAL  5.  *** Baseline:  Goal status: INITIAL  6.  *** Baseline:  Goal status: INITIAL  PLAN:  PT FREQUENCY: {rehab frequency:25116}  PT DURATION: {rehab duration:25117}  PLANNED INTERVENTIONS: {rehab planned interventions:25118::"Therapeutic exercises","Therapeutic activity","Neuromuscular re-education","Balance training","Gait training","Patient/Family education","Self Care","Joint mobilization"}  PLAN FOR NEXT SESSION: ***   Doniven Vanpatten, PT 04/08/2023, 5:32 PM

## 2023-04-09 ENCOUNTER — Encounter: Payer: 59 | Attending: Obstetrics and Gynecology | Admitting: Physical Therapy

## 2023-04-09 NOTE — BH Specialist Note (Signed)
Integrated Behavioral Health Follow Up In-Person Visit  MRN: [id number] Name: Nicole [name]  Less than 15 minute today, in person; pt agrees to reschedule for appointment when she's not feeling so irritable after missing earlier appointment with physical therapist.     Rae Lips, LCSW      03/18/2023   10:07 AM 02/13/2023    2:12 PM 12/27/2022    8:40 AM 09/25/2022    4:37 PM 09/06/2022   11:28 AM  Depression screen PHQ 2/9  Decreased Interest 1 1 0 0 1  Down, Depressed, Hopeless 0 0 0 0 0  PHQ - 2 Score 1 1 0 0 1  Altered sleeping 1 1  1 1   Tired, decreased energy 1 1  1 1   Change in appetite 1 0  0 1  Feeling bad or failure about yourself  0 1  0 0  Trouble concentrating 1 1  1  0  Moving slowly or fidgety/restless 0 0  0 0  Suicidal thoughts 0 0  0 0  PHQ-9 Score 5 5  3 4   Difficult doing work/chores    Somewhat difficult       03/18/2023   10:07 AM 02/13/2023    2:12 PM 08/22/2022    4:23 PM 08/25/2021   11:00 AM  GAD 7 : Generalized Anxiety Score  Nervous, Anxious, on Edge 1 1 1 1   Control/stop worrying 0 1 1 0  Worry too much - different things 1 1 1  0  Trouble relaxing 0 0 1 1  Restless 0 0 0 0  Easily annoyed or irritable 1 1 1 1   Afraid - awful might happen 1 0 0 0  Total GAD 7 Score 4 4 5  3

## 2023-04-12 NOTE — Therapy (Deleted)
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 01/18/2022   Sensation of food items getting stuck in upper esophagus to be evaluated first by esophagram and then by EGD with Dr. Leone Payor over the next few weeks.   Hypercholesterolemia    Hypotension    Morphea 2022   Nodule of skin of breast 01/04/2021   Obesity    OSA (obstructive sleep apnea) 12/27/2015   Palpitations    negative cardiac w/u per Dr. Daleen Squibb 2/09   Reflux esophagitis    Sleep apnea    No CPAP   Vaginal discharge 03/02/2020   Past Surgical History:  Procedure Laterality Date   COLONOSCOPY     ECTOPIC PREGNANCY SURGERY     EXPLORATORY LAPAROTOMY  2000   x2 (Dr. 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 07/03/2021   Other personal history of psychological trauma, not elsewhere classified 03/16/2021   Throat irritation 12/16/2019   Hyperlipidemia 11/10/2019   History of recurrent UTI (urinary tract infection) 11/12/2017   Marijuana use 11/12/2017   Hoarseness of voice 10/25/2016   Pain in left shoulder 07/20/2016   Hematuria of unknown etiology 06/01/2013   Tinea pedis 04/14/2007    PCP: Miguel Aschoff, MD  REFERRING PROVIDER: Lorriane Shire, MD   REFERRING DIAG: M79.10 (ICD-10-CM) - Myalgia   THERAPY DIAG:  No diagnosis found.  Rationale for Evaluation and Treatment: Rehabilitation  ONSET DATE: ***  SUBJECTIVE:  SUBJECTIVE STATEMENT: Pain able to reproduced on examination with palpation of inguinal ligament.  Fluid intake: {Yes/No:304960894}   PAIN:  Are you having pain? {yes/no:20286} NPRS scale: ***/10 Pain location: {pelvic pain location:27098}  Pain type: {type:313116} Pain description: {PAIN DESCRIPTION:21022940}   Aggravating factors: *** Relieving factors: ***  PRECAUTIONS: None  RED FLAGS: {PT Red Flags:29287}   WEIGHT BEARING RESTRICTIONS: No  FALLS:  Has patient fallen in last 6 months? {fallsyesno:27318}  LIVING ENVIRONMENT: Lives with: {[facility] lives with:25569::"lives with their family"} Lives in: {Lives in:25570} Stairs: {opstairs:27293} Has following equipment at home: {Assistive devices:23999}  OCCUPATION: ***  PLOF: {PLOF:24004}  PATIENT GOALS: ***  PERTINENT HISTORY:  Bipolar disorder; Exploratory laparotomy Sexual abuse: {Yes/No:304960894}  BOWEL MOVEMENT: Pain with bowel movement: {yes/no:20286} Type of bowel movement:{PT BM  type:27100} Fully empty rectum: {Yes/No:304960894} Leakage: {Yes/No:304960894} Pads: {Yes/No:304960894} Fiber supplement: {Yes/No:304960894}  URINATION: Pain with urination: {yes/no:20286} Fully empty bladder: {Yes/No:304960894} Stream: {PT urination:27102} Urgency: {Yes/No:304960894} Frequency: *** Leakage: {PT leakage:27103} Pads: {Yes/No:304960894}  INTERCOURSE: Pain with intercourse: {pain with intercourse PA:27099} Ability to have vaginal penetration:  {Yes/No:304960894} Climax: *** Marinoff Scale: ***/3  PREGNANCY: Vaginal deliveries *** Tearing {Yes***/No:304960894} C-section deliveries *** Currently pregnant {Yes***/No:304960894}  PROLAPSE: {PT prolapse:27101}   OBJECTIVE:   DIAGNOSTIC FINDINGS:  ***  PATIENT SURVEYS:  {rehab surveys:24030}  PFIQ-7 ***  COGNITION: Overall cognitive status: {cognition:24006}     SENSATION: Light touch: {intact/deficits:24005} Proprioception: {intact/deficits:24005}  MUSCLE LENGTH: Hamstrings: Right *** deg; Left *** deg Thomas test: Right *** deg; Left *** deg  LUMBAR SPECIAL TESTS:  {lumbar special test:25242}  FUNCTIONAL TESTS:  {Functional tests:24029}  GAIT: Distance walked: *** Assistive device utilized: {Assistive devices:23999} Level of assistance: {Levels of assistance:24026} Comments: ***  POSTURE: {posture:25561}  PELVIC ALIGNMENT:  LUMBARAROM/PROM:  A/PROM A/PROM  eval  Flexion   Extension   Right lateral flexion   Left lateral flexion   Right rotation   Left rotation    (Blank rows = not tested)  LOWER EXTREMITY ROM:  {AROM/PROM:27142} ROM Right eval Left eval  Hip flexion    Hip extension    Hip abduction    Hip adduction    Hip internal rotation    Hip external rotation    Knee flexion    Knee extension    Ankle dorsiflexion    Ankle plantarflexion    Ankle inversion    Ankle eversion     (Blank rows = not tested)  LOWER EXTREMITY MMT:  MMT Right eval Left eval   Hip flexion    Hip extension    Hip abduction    Hip adduction    Hip internal rotation    Hip external rotation    Knee flexion    Knee extension    Ankle dorsiflexion    Ankle plantarflexion    Ankle inversion    Ankle eversion     PALPATION:   General  ***                External Perineal Exam ***                             Internal Pelvic Floor ***  Patient confirms identification and approves PT to assess internal pelvic floor and treatment {yes/no:20286}  PELVIC MMT:   MMT eval  Vaginal   Internal Anal Sphincter   External Anal Sphincter   Puborectalis   Diastasis Recti   (Blank rows = not tested)  TONE: ***  PROLAPSE: ***  TODAY'S TREATMENT:                                                                                                                              DATE: ***  EVAL ***   PATIENT EDUCATION:  Education details: *** Person educated: {Person educated:25204} Education method: {Education Method:25205} Education comprehension: {Education Comprehension:25206}  HOME EXERCISE PROGRAM: ***  ASSESSMENT:  CLINICAL IMPRESSION: Patient is a *** y.o. *** who was seen today for physical therapy evaluation and treatment for ***.   OBJECTIVE IMPAIRMENTS: {opptimpairments:25111}.   ACTIVITY LIMITATIONS: {activitylimitations:27494}  PARTICIPATION LIMITATIONS: {participationrestrictions:25113}  PERSONAL FACTORS: {Personal factors:25162} are also affecting patient's functional outcome.   REHAB POTENTIAL: {rehabpotential:25112}  CLINICAL DECISION MAKING: {clinical decision making:25114}  EVALUATION COMPLEXITY: {Evaluation complexity:25115}   GOALS: Goals reviewed with patient? {yes/no:20286}  SHORT TERM GOALS: Target date: ***  *** Baseline: Goal status: INITIAL  2.  *** Baseline:  Goal status: INITIAL  3.  *** Baseline:  Goal status: INITIAL  4.  *** Baseline:  Goal status: INITIAL  5.  *** Baseline:  Goal status:  INITIAL  6.  *** Baseline:  Goal status: INITIAL  LONG TERM GOALS: Target date: ***  *** Baseline:  Goal status: INITIAL  2.  *** Baseline:  Goal status: INITIAL  3.  *** Baseline:  Goal status: INITIAL  4.  *** Baseline:  Goal status: INITIAL  5.  *** Baseline:  Goal status: INITIAL  6.  *** Baseline:  Goal status: INITIAL  PLAN:  PT FREQUENCY: {rehab frequency:25116}  PT DURATION: {rehab duration:25117}  PLANNED INTERVENTIONS: {rehab planned interventions:25118::"Therapeutic exercises","Therapeutic activity","Neuromuscular re-education","Balance training","Gait training","Patient/Family education","Self Care","Joint mobilization"}  PLAN FOR NEXT SESSION: ***   Shogo Larkey, PT 04/12/2023, 7:56 AM

## 2023-04-15 ENCOUNTER — Ambulatory Visit: Payer: 59 | Admitting: Physical Therapy

## 2023-04-23 ENCOUNTER — Ambulatory Visit: Payer: 59 | Admitting: Advanced Practice Midwife

## 2023-04-23 ENCOUNTER — Other Ambulatory Visit: Payer: Self-pay

## 2023-04-23 ENCOUNTER — Encounter: Payer: Self-pay | Admitting: Physical Therapy

## 2023-04-23 ENCOUNTER — Encounter: Payer: 59 | Attending: Obstetrics and Gynecology | Admitting: Physical Therapy

## 2023-04-23 ENCOUNTER — Encounter: Payer: 59 | Admitting: Physical Therapy

## 2023-04-23 ENCOUNTER — Ambulatory Visit: Payer: 59 | Admitting: Clinical

## 2023-04-23 DIAGNOSIS — M791 Myalgia, unspecified site: Secondary | ICD-10-CM | POA: Insufficient documentation

## 2023-04-23 DIAGNOSIS — R102 Pelvic and perineal pain: Secondary | ICD-10-CM | POA: Insufficient documentation

## 2023-04-23 DIAGNOSIS — F4322 Adjustment disorder with anxiety: Secondary | ICD-10-CM

## 2023-04-23 DIAGNOSIS — R252 Cramp and spasm: Secondary | ICD-10-CM | POA: Insufficient documentation

## 2023-04-23 NOTE — Therapy (Unsigned)
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 01/18/2022   Sensation of food items getting stuck in upper esophagus to be evaluated first by esophagram and then by EGD with Dr. Leone Payor over the next few weeks.   Hypercholesterolemia    Hypotension    Morphea 2022   Nodule of skin of breast 01/04/2021   Obesity    Nicole (obstructive sleep apnea) 12/27/2015   Palpitations    negative cardiac w/u per Dr. Daleen Squibb 2/09   Reflux esophagitis    Sleep apnea    No CPAP   Vaginal discharge 03/02/2020   Past Surgical History:  Procedure Laterality Date   COLONOSCOPY     ECTOPIC PREGNANCY SURGERY     EXPLORATORY LAPAROTOMY  2000   x2 (Dr. 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 07/03/2021   Other personal history of psychological trauma, not elsewhere classified 03/16/2021   Throat irritation 12/16/2019   Hyperlipidemia 11/10/2019   History of recurrent UTI (urinary tract infection) 11/12/2017   Marijuana use 11/12/2017   Hoarseness of voice 10/25/2016   Pain in left shoulder 07/20/2016   Hematuria of unknown etiology 06/01/2013   Tinea pedis 04/14/2007    PCP: Miguel Aschoff, MD  REFERRING PROVIDER: Lorriane Shire, MD   REFERRING DIAG: M79.10 (ICD-10-CM) - Myalgia   THERAPY DIAG:  No diagnosis found.  Rationale for Evaluation and Treatment: Rehabilitation  ONSET DATE: 2022  SUBJECTIVE:  SUBJECTIVE STATEMENT: The pelvic floor muscles are weak.  Fluid intake: {Yes/No:304960894}   PAIN:  Are you having pain? Yes NPRS scale: 8/10 Pain location:  vagina and suprapubic area  Pain type: dull and sharp Pain description: intermittent   Aggravating factors: sex Relieving factors: no sex  PRECAUTIONS: {Therapy precautions:24002}  RED FLAGS: {PT Red Flags:29287}   WEIGHT BEARING RESTRICTIONS: {Yes ***/No:24003}  FALLS:  Has patient fallen in last 6 months? {fallsyesno:27318}  LIVING ENVIRONMENT: Lives with: {[facility] lives with:25569::"lives with their family"} Lives in: {Lives in:25570} Stairs: {opstairs:27293} Has following equipment at home: {Assistive devices:23999}  OCCUPATION: ***  PLOF: {PLOF:24004}  PATIENT GOALS: ***  PERTINENT HISTORY:  *** Sexual abuse: {Yes/No:304960894}  BOWEL MOVEMENT: Pain with bowel movement: Yes, during and after, 6/10 Type of bowel movement:Type (Bristol Stool Scale) Type 1, Frequency daily, Strain Yes, and  Splinting yes Fully empty rectum: No Leakage: No Fiber supplement: Yes: will use Miralax  URINATION: Pain with urination: No Fully empty bladder: Yes: most of the time, if not then goes back in 2 minutes Stream: Strong Urgency: No Frequency: every 3-5 hours Leakage:  none INTERCOURSE: Pain with intercourse: Initial Penetration, During Penetration, After Intercourse, and Pain Interrupts Intercourse, tearing will happen Ability to have vaginal penetration:  Yes: but can tear Climax: yes Marinoff Scale: 2/3  PREGNANCY: Vaginal deliveries 3 Tearing Yes: episiomty , tear to the rectum      OBJECTIVE:   DIAGNOSTIC FINDINGS:  ***  PATIENT SURVEYS:  {rehab surveys:24030} POPIQ-7 29 CRAIQ-7:24 PFIQ-7 52  COGNITION: Overall cognitive status: {cognition:24006}     SENSATION: Light touch: {intact/deficits:24005} Proprioception: {intact/deficits:24005}  MUSCLE LENGTH: Hamstrings: Right *** deg; Left *** deg Thomas test: Right *** deg; Left *** deg  LUMBAR SPECIAL TESTS:  {lumbar special test:25242}  FUNCTIONAL TESTS:  {Functional tests:24029}  GAIT: Distance walked: *** Assistive device utilized: {Assistive devices:23999} Level of assistance: {Levels of assistance:24026} Comments: ***  POSTURE: {posture:25561}  PELVIC ALIGNMENT:  LUMBARAROM/PROM:  A/PROM A/PROM  eval  Flexion   Extension   Right lateral flexion   Left lateral flexion   Right rotation   Left rotation    (Blank rows = not tested)  LOWER EXTREMITY ROM:  {AROM/PROM:27142} ROM Right eval Left eval  Hip flexion    Hip extension    Hip abduction    Hip adduction    Hip internal rotation    Hip external rotation    Knee flexion    Knee extension    Ankle dorsiflexion    Ankle plantarflexion    Ankle inversion    Ankle eversion     (Blank rows = not tested)  LOWER EXTREMITY MMT:  MMT Right eval Left eval  Hip flexion    Hip extension  4/5  Hip abduction  4/5  Hip  adduction  4/5  Hip internal rotation    Hip external rotation    Knee flexion    Knee extension    Ankle dorsiflexion    Ankle plantarflexion    Ankle inversion    Ankle eversion     PALPATION:   General  tenderness in the abdomen, decreased mobility of the scar aove the pubic bone                External Perineal Exam tenderness located in the first layer of pelvic floor                             Internal  Pelvic Floor ***  Patient confirms identification and approves PT to assess internal pelvic floor and treatment {yes/no:20286}  PELVIC MMT:   MMT eval  Vaginal   Internal Anal Sphincter   External Anal Sphincter   Puborectalis   Diastasis Recti   (Blank rows = not tested)        TONE: ***  PROLAPSE: ***  TODAY'S TREATMENT:                                                                                                                              DATE: ***  EVAL ***   PATIENT EDUCATION:  Education details: *** Person educated: {Person educated:25204} Education method: {Education Method:25205} Education comprehension: {Education Comprehension:25206}  HOME EXERCISE PROGRAM: ***  ASSESSMENT:  CLINICAL IMPRESSION: Patient is a *** y.o. *** who was seen today for physical therapy evaluation and treatment for ***.   OBJECTIVE IMPAIRMENTS: {opptimpairments:25111}.   ACTIVITY LIMITATIONS: {activitylimitations:27494}  PARTICIPATION LIMITATIONS: {participationrestrictions:25113}  PERSONAL FACTORS: {Personal factors:25162} are also affecting patient's functional outcome.   REHAB POTENTIAL: {rehabpotential:25112}  CLINICAL DECISION MAKING: {clinical decision making:25114}  EVALUATION COMPLEXITY: {Evaluation complexity:25115}   GOALS: Goals reviewed with patient? {yes/no:20286}  SHORT TERM GOALS: Target date: ***  *** Baseline: Goal status: INITIAL  2.  *** Baseline:  Goal status: INITIAL  3.  *** Baseline:  Goal status: INITIAL  4.   *** Baseline:  Goal status: INITIAL  5.  *** Baseline:  Goal status: INITIAL  6.  *** Baseline:  Goal status: INITIAL  LONG TERM GOALS: Target date: ***  *** Baseline:  Goal status: INITIAL  2.  *** Baseline:  Goal status: INITIAL  3.  *** Baseline:  Goal status: INITIAL  4.  *** Baseline:  Goal status: INITIAL  5.  *** Baseline:  Goal status: INITIAL  6.  *** Baseline:  Goal status: INITIAL  PLAN:  PT FREQUENCY: {rehab frequency:25116}  PT DURATION: {rehab duration:25117}  PLANNED INTERVENTIONS: {rehab planned interventions:25118::"Therapeutic exercises","Therapeutic activity","Neuromuscular re-education","Balance training","Gait training","Patient/Family education","Self Care","Joint mobilization"}  PLAN FOR NEXT SESSION: ***   Kynzlee Hucker, PT 04/23/2023, 10:17 AM

## 2023-04-23 NOTE — Therapy (Signed)
OUTPATIENT PHYSICAL THERAPY FEMALE PELVIC EVALUATION   Patient Name: Nicole [name] MRN: [id number] DOB:1960-12-05, 62 y.o., female Today's Date: 04/23/2023  END OF SESSION:  PT End of Session - 04/23/23 1100     Visit Number 1    Date for PT Re-Evaluation 06/18/23    Authorization Type [employer] medicare/ medicaid    Authorization - Visit Number 1    Authorization - Number of Visits 10    PT Start Time 1013    PT Stop Time 1035    PT Time Calculation (min) 22 min    Activity Tolerance Patient tolerated treatment well    Behavior During Therapy WFL for tasks assessed/performed             Past Medical History:  Diagnosis Date   Acute pain of right knee 01/04/2021   Acute pain of right shoulder 01/04/2021   Anemia    Anxiety    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 01/18/2022   Sensation of food items getting stuck in upper esophagus to be evaluated first by esophagram and then by EGD with Dr. Leone Payor over the next few weeks.   Hypercholesterolemia    Hypotension    Morphea 2022   Nodule of skin of breast 01/04/2021   Obesity    OSA (obstructive sleep apnea) 12/27/2015   Palpitations    negative cardiac w/u per Dr. Daleen Squibb 2/09   Reflux esophagitis    Sleep apnea    No CPAP   Vaginal discharge 03/02/2020   Past Surgical History:  Procedure Laterality Date   COLONOSCOPY     ECTOPIC PREGNANCY SURGERY     EXPLORATORY LAPAROTOMY  2000   x2 (Dr. 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 07/03/2021   Other personal history of psychological trauma, not elsewhere classified 03/16/2021   Throat irritation 12/16/2019   Hyperlipidemia 11/10/2019   History of recurrent UTI (urinary tract infection) 11/12/2017   Marijuana use 11/12/2017   Hoarseness of voice 10/25/2016   Pain in left shoulder 07/20/2016   Hematuria of unknown etiology 06/01/2013   Tinea pedis 04/14/2007    PCP: Miguel Aschoff, MD  REFERRING PROVIDER: Lorriane Shire, MD  REFERRING DIAG: M79.10 (ICD-10-CM) - Myalgia   THERAPY DIAG:  Cramp and spasm  Pelvic pain  Rationale for Evaluation and Treatment: Rehabilitation  ONSET DATE: 2022  SUBJECTIVE:  SUBJECTIVE STATEMENT: Patient reports pelvic pain for several years. She was in physical therapy in 2022 for the pain. She has pain with vaginal penetration and will tear. She has difficulty with having bowel movements.   PAIN:  Are you having pain? Yes NPRS scale: 8/10 Pain location: Vaginal and suprapubic bone  Pain type: dull and sharp Pain description: intermittent   Aggravating factors: vaginal penetration, sitting Relieving factors: no vaginal penetration, bowel movements  PRECAUTIONS: None  RED FLAGS: None   WEIGHT BEARING  RESTRICTIONS: No  FALLS:  Has patient fallen in last 6 months? No  LIVING ENVIRONMENT: Lives with: lives alone   OCCUPATION: not sure  PLOF: Independent  PATIENT GOALS: reduce pain and make it easier for bowel movements  PERTINENT HISTORY:  See above   BOWEL MOVEMENT: Pain with bowel movement: Yes Type of bowel movement:Type (Bristol Stool Scale) Type 1, Frequency 1-2 times per day, Strain Yes, and Splinting yes, vaginally Fully empty rectum: No Leakage: No Fiber supplement: Yes: Murelax  URINATION: Pain with urination: No Fully empty bladder: Yes: sometime has to go back 2 minutes later to urinate again Stream: Strong Urgency: No Frequency: 3-5 hours Leakage:  none Pads: No  INTERCOURSE: Pain with intercourse: Initial Penetration, During Penetration, After Intercourse, and Pain Interrupts Intercourse Ability to have vaginal penetration:  Yes: will get tearing Climax: yes Marinoff Scale: 2-3/3  PREGNANCY: Vaginal deliveries 3 Tearing Yes: grade 4 and episiotomy  OBJECTIVE:   DIAGNOSTIC FINDINGS:  none  PATIENT SURVEYS:  POPIQ-7 29 CRAIQ-7 24 PFIQ-7 52  COGNITION: Overall cognitive status: Within functional limits for tasks assessed     SENSATION: Light touch: Appears intact Proprioception: Appears intact    POSTURE: No Significant postural limitations  PELVIC ALIGNMENT: ASIS are equal   LOWER EXTREMITY ROM: Bilateral hip ROM is full   LOWER EXTREMITY MMT:  MMT Right eval Left eval  Hip extension 5/5 4/5  Hip abduction 5/5 4/5  Hip adduction 5/5 4/5   PALPATION:   General  abdominal area is tender; tenderness supra pubically; scar suprapubic area is limited;                 External Perineal Exam tenderness located on the perineal  body, along the ischiocavernosus, bulbocavernosus, levator ani and obturator internist                             Internal Pelvic Floor not assessed at this time  Patient confirms identification and  approves PT to assess internal pelvic floor and treatment No  PELVIC MMT:   MMT eval  Vaginal   Internal Anal Sphincter   External Anal Sphincter   Puborectalis   Diastasis Recti   (Blank rows = not tested)        TONE: increased  TODAY'S TREATMENT:  DATE: 04/23/23  EVAL see below   PATIENT EDUCATION:  Education details: educated patient on findings, what will be done in therapy at next visit and reviewed goals Person educated: Patient Education method: Explanation Education comprehension: verbalized understanding  HOME EXERCISE PROGRAM: See above  ASSESSMENT:  CLINICAL IMPRESSION: Patient is a 62 y.o. female who was seen today for physical therapy evaluation and treatment for myalgia. Patient has had pelvic pain for several years. She had physical therapy in the past and was discharged in 2022.  Patient reports pain level 7/10 vaginally, rectally, and suprapubic area. She will have increased pain with bowel movements, penile penetration vaginally and sitting. She will have to splint and strain with a bowel movement. Patient will have pain with penile penetration vaginally and the vulvar area will tear. Marinoff score is 2-3/3. She has tenderness located in the levator ani, obturator internist, ischiocavernosus, bulbocavernosus, and perineal body. She was not up to an internal assessment of the vaginal canal today. Left hip strength is 4/5. Patient will benefit from skilled therapy to reduce her pain and improve ease of bowel movements.   OBJECTIVE IMPAIRMENTS: decreased coordination, decreased endurance, decreased strength, increased fascial restrictions, increased muscle spasms, and pain.   ACTIVITY LIMITATIONS: sitting and toileting  PARTICIPATION LIMITATIONS: meal prep, cleaning, interpersonal relationship, and community activity  PERSONAL FACTORS:  Age, Fitness, Time since onset of injury/illness/exacerbation, and 1-2 comorbidities: depression, recurrent UTI  are also affecting patient's functional outcome.   REHAB POTENTIAL: Good  CLINICAL DECISION MAKING: Evolving/moderate complexity  EVALUATION COMPLEXITY: Moderate   GOALS: Goals reviewed with patient? Yes  SHORT TERM GOALS: Target date: 05/20/23  Patient independent with hip stretches to elongate the pelvic floor muscles.  Baseline: Goal status: INITIAL  2.  Patient is independent with abdominal massage to promote peristalic motion of the intestines.  Baseline:  Goal status: INITIAL  3.  Patient is educated on perineal massage to assist with elongation of the tissue to reduce the chance of tearing.  Baseline:  Goal status: INITIAL   LONG TERM GOALS: Target date: 06/18/23  Patient is independent with advanced HEP for pelvic floor lengthening.  Baseline:  Goal status: INITIAL  2.  Patient is able to have vaginal penetration with pain level >/= 2-3/10 due to the ability to relax the pelvic floor with diaphragmatic breathing.  Baseline:  Goal status: INITIAL  3.  Patient reports straining with bowel movements decreased >/= 2/10 due to the ability to relax the pelvic floor while pushing the stool out.  Baseline:  Goal status: INITIAL  4.  Patient is able to sit with discomfort </= 3/10 due to reduction of trigger points.  Baseline:  Goal status: INITIAL  5.  PFIQ-7 goes from 52 to 20 deu to decreased pain and decreased frustration.  Baseline:  Goal status: INITIAL   PLAN:  PT FREQUENCY: 1x/week  PT DURATION: 1 week  PLANNED INTERVENTIONS: Therapeutic exercises, Therapeutic activity, Neuromuscular re-education, Balance training, Patient/Family education, Joint mobilization, Dry Needling, Electrical stimulation, Cryotherapy, Moist heat, Ultrasound, Biofeedback, and Manual therapy  PLAN FOR NEXT SESSION: assess pelvic floor if patient is up to it; abdominal  massage, hip stretches, diaphragmatic breathing   [name], PT 04/23/23 1:00 PM

## 2023-04-24 NOTE — BH Specialist Note (Signed)
Integrated Behavioral Health Follow Up In-Person Visit  MRN: [id number] Name: Nicole [name]  Number of Integrated Behavioral Health Clinician visits: 3- Third Visit  Session Start time: [phone]   Session End time: [phone]  Total time in minutes: 33   Types of Service: Individual psychotherapy  Interpretor:No. Interpretor Name and Language: n/a  Subjective: Nicole [name] is a 62 y.o. female accompanied by  n/a Patient was referred by Lorriane Shire, MD for life stress. Patient reports the following symptoms/concerns: Continued life stress with anxiety and depression (mold issues in home; complicated relationship).  Duration of problem: Ongoing; Severity of problem: moderate  Objective: Mood: NA and Affect: Appropriate Risk of harm to self or others: No plan to harm self or others  Life Context: Family and Social: Pt lives by herself with partner (he's sometimes there); supportive friends  School/Work: - Self-Care: Positive outlook; problem-solving; time with friends and nature Life Changes: Ongoing housing and relationship issue(s)  Patient and/or Family's Strengths/Protective Factors: Social connections, Sense of purpose, and Physical Health (exercise, healthy diet, medication compliance, etc.)  Goals Addressed: Patient will:  Reduce symptoms of: anxiety, depression, and stress   Increase knowledge and/or ability of: stress reduction   Demonstrate ability to: Increase healthy adjustment to current life circumstances and Increase motivation to adhere to plan of care  Progress towards Goals: Ongoing  Interventions: Interventions utilized:  Supportive Reflection Standardized Assessments completed: Not Needed  Patient and/or Family Response: Patient agrees with treatment plan.   Patient Centered Plan: Patient is on the following Treatment Plan(s): IBH Assessment: Patient currently experiencing Adjustment disorder with mixed anxious and depressed mood; Psychosocial  stress.   Patient may benefit from continued therapeutic intervention  .  Plan: Follow up with behavioral health clinician on : Two weeks Behavioral recommendations:  -Continue using daily self-coping strategies (positive outlook; problem-solving skills, time with friends and in nature) as needed -Continue upcoming beach trip for peace and overall wellness -Continue working via legal means to address housing issue; continue attending upcoming medical appointments; being mindful to share concerns about mold on health with medical providers Referral(s): [facility] (In Clinic)  [address] [place], [place]     03/18/2023   10:07 AM 02/13/2023    2:12 PM 12/27/2022    8:40 AM 09/25/2022    4:37 PM 09/06/2022   11:28 AM  Depression screen PHQ 2/9  Decreased Interest 1 1 0 0 1  Down, Depressed, Hopeless 0 0 0 0 0  PHQ - 2 Score 1 1 0 0 1  Altered sleeping 1 1  1 1   Tired, decreased energy 1 1  1 1   Change in appetite 1 0  0 1  Feeling bad or failure about yourself  0 1  0 0  Trouble concentrating 1 1  1  0  Moving slowly or fidgety/restless 0 0  0 0  Suicidal thoughts 0 0  0 0  PHQ-9 Score 5 5  3 4   Difficult doing work/chores    Somewhat difficult       03/18/2023   10:07 AM 02/13/2023    2:12 PM 08/22/2022    4:23 PM 08/25/2021   11:00 AM  GAD 7 : Generalized Anxiety Score  Nervous, Anxious, on Edge 1 1 1 1   Control/stop worrying 0 1 1 0  Worry too much - different things 1 1 1  0  Trouble relaxing 0 0 1 1  Restless 0 0 0 0  Easily annoyed or irritable 1 1  1 1  Afraid - awful might happen 1 0 0 0  Total GAD 7 Score 4 4 5  3

## 2023-04-29 ENCOUNTER — Telehealth: Payer: Self-pay | Admitting: Internal Medicine

## 2023-04-29 NOTE — Telephone Encounter (Signed)
Left message for pt to call back  °

## 2023-04-29 NOTE — Telephone Encounter (Signed)
Patient called states she have been having severe abdominal pain for about a month now and is hearing noises in her stomach for about a few weeks now and would like to discuss further with a nurse.

## 2023-04-30 ENCOUNTER — Encounter: Payer: 59 | Admitting: Physical Therapy

## 2023-04-30 NOTE — Telephone Encounter (Signed)
Left message for pt to call back  °

## 2023-04-30 NOTE — Telephone Encounter (Signed)
Pt stated that she has  been having ongoing abdominal pain for several weeks now. Pt stated that she did have some abdominal bloating but has had several large soft  BM in the last several days. Pt stated that her bowel patters are very irregular and that she continues to have abdominal pain. Pt was notified that we will reach out to her on 05/03/2023 and schedule her for an office visit on 05/10/2023 at 10:00 with Hyacinth Meeker PA.  ( 7 day hold) Pt made aware.  Pt verbalized understanding with all questions answered.

## 2023-05-02 ENCOUNTER — Emergency Department (HOSPITAL_COMMUNITY): Payer: 59

## 2023-05-02 ENCOUNTER — Encounter (HOSPITAL_COMMUNITY): Payer: Self-pay

## 2023-05-02 ENCOUNTER — Emergency Department (HOSPITAL_COMMUNITY)
Admission: EM | Admit: 2023-05-02 | Discharge: 2023-05-02 | Disposition: A | Discharge status: Home / Self Care | Payer: 59 | Attending: Emergency Medicine | Admitting: Emergency Medicine

## 2023-05-02 ENCOUNTER — Other Ambulatory Visit: Payer: Self-pay

## 2023-05-02 ENCOUNTER — Telehealth: Payer: Self-pay

## 2023-05-02 DIAGNOSIS — R110 Nausea: Secondary | ICD-10-CM | POA: Diagnosis not present

## 2023-05-02 DIAGNOSIS — R112 Nausea with vomiting, unspecified: Secondary | ICD-10-CM | POA: Insufficient documentation

## 2023-05-02 DIAGNOSIS — K760 Fatty (change of) liver, not elsewhere classified: Secondary | ICD-10-CM | POA: Diagnosis not present

## 2023-05-02 DIAGNOSIS — N281 Cyst of kidney, acquired: Secondary | ICD-10-CM | POA: Diagnosis not present

## 2023-05-02 DIAGNOSIS — R197 Diarrhea, unspecified: Secondary | ICD-10-CM | POA: Insufficient documentation

## 2023-05-02 LAB — CBC
HCT: 37.6 % (ref 36.0–46.0)
Hemoglobin: 12 g/dL (ref 12.0–15.0)
MCH: 31.7 pg (ref 26.0–34.0)
MCHC: 31.9 g/dL (ref 30.0–36.0)
MCV: 99.2 fL (ref 80.0–100.0)
Platelets: 201 10*3/uL (ref 150–400)
RBC: 3.79 MIL/uL — ABNORMAL LOW (ref 3.87–5.11)
RDW: 14 % (ref 11.5–15.5)
WBC: 3.5 10*3/uL — ABNORMAL LOW (ref 4.0–10.5)
nRBC: 0 % (ref 0.0–0.2)

## 2023-05-02 LAB — URINALYSIS, ROUTINE W REFLEX MICROSCOPIC
Bilirubin Urine: NEGATIVE
Glucose, UA: NEGATIVE mg/dL
Ketones, ur: NEGATIVE mg/dL
Leukocytes,Ua: NEGATIVE
Nitrite: NEGATIVE
Protein, ur: NEGATIVE mg/dL
Specific Gravity, Urine: 1.02 (ref 1.005–1.030)
pH: 5 (ref 5.0–8.0)

## 2023-05-02 LAB — COMPREHENSIVE METABOLIC PANEL
ALT: 30 U/L (ref 0–44)
AST: 20 U/L (ref 15–41)
Albumin: 4.1 g/dL (ref 3.5–5.0)
Alkaline Phosphatase: 57 U/L (ref 38–126)
Anion gap: 6 (ref 5–15)
BUN: 14 mg/dL (ref 8–23)
CO2: 27 mmol/L (ref 22–32)
Calcium: 9.2 mg/dL (ref 8.9–10.3)
Chloride: 107 mmol/L (ref 98–111)
Creatinine, Ser: 0.73 mg/dL (ref 0.44–1.00)
GFR, Estimated: >60 mL/min (ref >60)
Glucose, Bld: 97 mg/dL (ref 70–99)
Potassium: 4.3 mmol/L (ref 3.5–5.1)
Sodium: 140 mmol/L (ref 135–145)
Total Bilirubin: 0.6 mg/dL (ref 0.3–1.2)
Total Protein: 6.8 g/dL (ref 6.5–8.1)

## 2023-05-02 LAB — LIPASE, BLOOD: Lipase: 25 U/L (ref 11–51)

## 2023-05-02 MED ORDER — ONDANSETRON HCL 4 MG PO TABS: 4.0000 mg | ORAL_TABLET | Freq: Four times a day (QID) | ORAL | 0 refills | Status: DC

## 2023-05-02 MED ORDER — FAMOTIDINE IN NACL 20-0.9 MG/50ML-% IV SOLN
20.0000 mg | Freq: Once | INTRAVENOUS | Status: AC
  Administered 2023-05-02: 20 mg via INTRAVENOUS
  Filled 2023-05-02: qty 50

## 2023-05-02 MED ORDER — SODIUM CHLORIDE 0.9 % IV BOLUS
500.0000 mL | Freq: Once | INTRAVENOUS | Status: AC
  Administered 2023-05-02: 500 mL via INTRAVENOUS

## 2023-05-02 MED ORDER — ONDANSETRON HCL 4 MG/2ML IJ SOLN
4.0000 mg | Freq: Once | INTRAMUSCULAR | Status: AC
  Administered 2023-05-02: 4 mg via INTRAVENOUS
  Filled 2023-05-02: qty 2

## 2023-05-02 MED ORDER — IOHEXOL 300 MG/ML  SOLN
100.0000 mL | Freq: Once | INTRAMUSCULAR | Status: AC | PRN
  Administered 2023-05-02: 100 mL via INTRAVENOUS

## 2023-05-02 NOTE — Telephone Encounter (Signed)
Requesting to speak with a nurse about getting an appt for diarrhea. No open slot available today or Friday. Please call pt back.

## 2023-05-02 NOTE — ED Triage Notes (Signed)
Patient has had diarrhea since Sunday. Stated she feels like she has GERD and is burping things up. It started out with a bloated stomach, she ate a bag of nuts, then she said she has had diarrhea since.

## 2023-05-02 NOTE — ED Provider Notes (Signed)
Care of patient received from prior provider at 6:40 PM, please see their note for complete H/P and care plan.  Received handoff per ED course.  Clinical Course as of 05/02/23 1840  Thu May 02, 2023  1603 Stable 34 YOF with diarrheal illness.  LLQ pain for diverticulitis rule out. Reassess. [CC]    Clinical Course User Index [CC] Glyn Ade, MD    Reassessment: Reassessed at bedside pain is better.  Patient no acute distress.  Discussed supportive care with the patient expressed understanding.  Will send with Zofran for nonspecific colitis and nausea recommend follow-up with PCP within 48 hours.     Glyn Ade, MD 05/02/23 1840

## 2023-05-02 NOTE — Telephone Encounter (Signed)
RTC to patient .  Message left on VM that Clinics had returned her call.

## 2023-05-02 NOTE — ED Provider Notes (Signed)
[place] EMERGENCY DEPARTMENT AT [facility] Provider Note   CSN: [id number] Arrival date & time: 05/02/23  1048     History  Chief Complaint  Patient presents with   Diarrhea    Nicole [name] is a 62 y.o. female.  HPI   62 year old female presents to the emergency department with concern for diarrhea since Sunday.  She is also complaining of acid reflux and GERD with a lot of burping.  She feels like her abdomen is bloated but no specific pain.  Endorses chills and decreased appetite but no documented fever.  Denies any genitourinary symptoms.  Home Medications Prior to Admission medications   Medication Sig Start Date End Date Taking? Authorizing Provider  albuterol (VENTOLIN HFA) 108 (90 Base) MCG/ACT inhaler Inhale into the lungs every 6 (six) hours as needed for wheezing or shortness of breath. Patient not taking: Reported on 02/13/2023    [provider]  amoxicillin (AMOXIL) 500 MG tablet Take 500 mg by mouth 3 (three) times daily. 03/21/23   [provider]  chlorhexidine (PERIDEX) 0.12 % solution SMARTSIG:By Mouth Patient not taking: Reported on 04/01/2023 03/08/23   [provider]  SYMBICORT 160-4.5 MCG/ACT inhaler Inhale into the lungs. Patient not taking: Reported on 02/13/2023 11/02/22   [provider]      Allergies    Other    Review of Systems   Review of Systems  Constitutional:  Positive for appetite change, chills and fatigue. Negative for fever.  Respiratory:  Negative for shortness of breath.   Cardiovascular:  Negative for chest pain.  Gastrointestinal:  Positive for abdominal pain, diarrhea and nausea. Negative for vomiting.  Genitourinary:  Negative for dysuria and flank pain.  Musculoskeletal:  Negative for back pain.  Skin:  Negative for rash.  Neurological:  Negative for headaches.    Physical Exam Updated Vital Signs BP 121/72   Pulse 60   Temp 98 F (36.7 C) (Oral)   Resp 17   Ht 5\' 5"  (1.651  m)   Wt 78 kg   LMP 03/03/2016 (Approximate)   SpO2 99%   BMI 28.62 kg/m  Physical Exam Vitals and nursing note reviewed.  Constitutional:      General: She is not in acute distress.    Appearance: Normal appearance.  HENT:     Head: Normocephalic.     Mouth/Throat:     Mouth: Mucous membranes are moist.  Cardiovascular:     Rate and Rhythm: Normal rate.  Pulmonary:     Effort: Pulmonary effort is normal. No respiratory distress.  Abdominal:     General: Bowel sounds are normal.     Palpations: Abdomen is soft.     Tenderness: There is abdominal tenderness. There is no guarding or rebound.     Comments: Lower quadrant abdominal pain, worse on the LLQ with palpation.  Skin:    General: Skin is warm.  Neurological:     Mental Status: She is alert and oriented to person, place, and time. Mental status is at baseline.  Psychiatric:        Mood and Affect: Mood normal.     ED Results / Procedures / Treatments   Labs (all labs ordered are listed, but only abnormal results are displayed) Labs Reviewed  CBC - Abnormal; Notable for the following components:      Result Value   WBC 3.5 (*)    RBC 3.79 (*)    All other components within normal limits  URINALYSIS, ROUTINE W REFLEX MICROSCOPIC - Abnormal; Notable for the following components:   Hgb urine dipstick MODERATE (*)    Bacteria, UA RARE (*)    All other components within normal limits  LIPASE, BLOOD  COMPREHENSIVE METABOLIC PANEL    EKG None  Radiology No results found.  Procedures Procedures    Medications Ordered in ED Medications  sodium chloride 0.9 % bolus 500 mL (500 mLs Intravenous New Bag/Given 05/02/23 1524)  ondansetron (ZOFRAN) injection 4 mg (4 mg Intravenous Given 05/02/23 1523)  famotidine (PEPCID) IVPB 20 mg premix (0 mg Intravenous Stopped 05/02/23 1554)  iohexol (OMNIPAQUE) 300 MG/ML solution 100 mL (100 mLs Intravenous Contrast Given 05/02/23 1558)    ED Course/ Medical Decision Making/  A&P Clinical Course as of 05/02/23 1615  Thu May 02, 2023  1603 Stable 26 YOF with diarrheal illness.  LLQ pain for diverticulitis rule out. Reassess. [CC]    Clinical Course User Index [CC] Glyn Ade, MD                                 Medical Decision Making Amount and/or Complexity of Data Reviewed Labs: ordered. Radiology: ordered.  Risk Prescription drug management.   62 year old female presents emergency department concern for nausea, diarrhea and decreased appetite.  She was tachycardic on arrival, afebrile.  Abdomen is benign but there is point tenderness in the lower abdomen, specifically the left lower quadrant.  Blood work is reassuring.  Will plan for IV medicine and CT imaging given the discomfort in the lower abdomen.  Patient signed out to Dr. Doran Durand.        Final Clinical Impression(s) / ED Diagnoses Final diagnoses:  None    Rx / DC Orders ED Discharge Orders     None         Rozelle Logan, DO 05/03/23 1657

## 2023-05-03 NOTE — Telephone Encounter (Signed)
Attempted to reach patient again x2 to confirm appointment, line busy.

## 2023-05-03 NOTE — Telephone Encounter (Signed)
Scheduled OV for 05/10/23 at 10:00 am with Victorino Dike. Attempted to reach patient x 2, line busy. Will try again at a later time.

## 2023-05-06 NOTE — Telephone Encounter (Signed)
Left message for pt to call back  °

## 2023-05-07 ENCOUNTER — Encounter: Payer: 59 | Admitting: Physical Therapy

## 2023-05-07 ENCOUNTER — Encounter: Payer: Self-pay | Admitting: Physical Therapy

## 2023-05-07 DIAGNOSIS — M791 Myalgia, unspecified site: Secondary | ICD-10-CM | POA: Diagnosis not present

## 2023-05-07 DIAGNOSIS — R102 Pelvic and perineal pain: Secondary | ICD-10-CM

## 2023-05-07 DIAGNOSIS — R252 Cramp and spasm: Secondary | ICD-10-CM | POA: Diagnosis not present

## 2023-05-07 NOTE — Therapy (Signed)
OUTPATIENT PHYSICAL THERAPY FEMALE PELVIC TREATMENT   Patient Name: Nicole [name] MRN: [id number] DOB:03-Sep-1960, 62 y.o., female Today's Date: 05/07/2023  END OF SESSION:  PT End of Session - 05/07/23 0924     Visit Number 2    Date for PT Re-Evaluation 06/18/23    Authorization Type [employer] medicare/ medicaid    Authorization - Visit Number 2    Authorization - Number of Visits 10    PT Start Time [phone]    PT Stop Time 1015    PT Time Calculation (min) 51 min    Activity Tolerance Patient tolerated treatment well    Behavior During Therapy WFL for tasks assessed/performed             Past Medical History:  Diagnosis Date   Acute pain of right knee 01/04/2021   Acute pain of right shoulder 01/04/2021   Anemia    Anxiety    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 01/18/2022   Sensation of food items getting stuck in upper esophagus to be evaluated first by esophagram and then by EGD with Dr. Leone Payor over the next few weeks.   Hypercholesterolemia    Hypotension    Morphea 2022   Nodule of skin of breast 01/04/2021   Obesity    OSA (obstructive sleep apnea) 12/27/2015   Palpitations    negative cardiac w/u per Dr. Daleen Squibb 2/09   Reflux esophagitis    Sleep apnea    No CPAP   Vaginal discharge 03/02/2020   Past Surgical History:  Procedure Laterality Date   COLONOSCOPY     ECTOPIC PREGNANCY SURGERY     EXPLORATORY LAPAROTOMY  2000   x2 (Dr. 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 07/03/2021   Other personal history of psychological trauma, not elsewhere classified 03/16/2021   Throat irritation 12/16/2019   Hyperlipidemia 11/10/2019   History of recurrent UTI (urinary tract infection) 11/12/2017   Marijuana use 11/12/2017   Hoarseness of voice 10/25/2016   Pain in left shoulder 07/20/2016   Hematuria of unknown etiology 06/01/2013   Tinea pedis 04/14/2007    PCP: Miguel Aschoff, MD  REFERRING PROVIDER: Lorriane Shire, MD  REFERRING DIAG: M79.10 (ICD-10-CM) - Myalgia   THERAPY DIAG:  Cramp and spasm  Pelvic pain  Rationale for Evaluation and Treatment: Rehabilitation  ONSET DATE: 2022  SUBJECTIVE:  SUBJECTIVE STATEMENT: I was not able to come to last visit due to having a stomach virus. I am going to the gym now.    PAIN:  Are you having pain? Yes NPRS scale: 8/10 Pain location: Vaginal and suprapubic bone  Pain type: dull and sharp Pain description: intermittent   Aggravating factors: vaginal penetration, sitting Relieving factors: no vaginal penetration, bowel movements  PRECAUTIONS: None  RED FLAGS: None   WEIGHT BEARING RESTRICTIONS: No  FALLS:  Has patient fallen in last 6 months? No  LIVING ENVIRONMENT: Lives with:  lives alone   OCCUPATION: not sure  PLOF: Independent  PATIENT GOALS: reduce pain and make it easier for bowel movements  PERTINENT HISTORY:  See above   BOWEL MOVEMENT: Pain with bowel movement: Yes Type of bowel movement:Type (Bristol Stool Scale) Type 1, Frequency 1-2 times per day, Strain Yes, and Splinting yes, vaginally Fully empty rectum: No Leakage: No Fiber supplement: Yes: Murelax  URINATION: Pain with urination: No Fully empty bladder: Yes: sometime has to go back 2 minutes later to urinate again Stream: Strong Urgency: No Frequency: 3-5 hours Leakage:  none Pads: No  INTERCOURSE: Pain with intercourse: Initial Penetration, During Penetration, After Intercourse, and Pain Interrupts Intercourse Ability to have vaginal penetration:  Yes: will get tearing Climax: yes Marinoff Scale: 2-3/3  PREGNANCY: Vaginal deliveries 3 Tearing Yes: grade 4 and episiotomy  OBJECTIVE:   DIAGNOSTIC FINDINGS:  none  PATIENT SURVEYS:  POPIQ-7 29 CRAIQ-7 24 PFIQ-7 52  COGNITION: Overall cognitive status: Within functional limits for tasks assessed     SENSATION: Light touch: Appears intact Proprioception: Appears intact    POSTURE: No Significant postural limitations  PELVIC ALIGNMENT: ASIS are equal   LOWER EXTREMITY ROM: Bilateral hip ROM is full   LOWER EXTREMITY MMT:  MMT Right eval Left eval  Hip extension 5/5 4/5  Hip abduction 5/5 4/5  Hip adduction 5/5 4/5   PALPATION:   General  abdominal area is tender; tenderness supra pubically; scar suprapubic area is limited;                 External Perineal Exam tenderness located on the perineal  body, along the ischiocavernosus, bulbocavernosus, levator ani and obturator internist                             Internal Pelvic Floor not assessed at this time  Patient confirms identification and approves PT to assess internal pelvic floor and treatment No  PELVIC MMT:   MMT eval  Vaginal    Internal Anal Sphincter   External Anal Sphincter   Puborectalis   Diastasis Recti   (Blank rows = not tested)        TONE: increased  TODAY'S TREATMENT:         05/07/23 Manual: Soft tissue mobilization: Circular massage to the abdomen to promote peristalic motion of the intestines Educated patient on how to perform abdominal massage Scar tissue mobilization: Scar massage to lower abdomen to improve tissue mobility Myofascial release: Fascial release around the lower abdomen and upper to reduce fascial restrictions to improve bowel movements.  Exercises: Stretches/mobility: Piriformis stretch sitting holding 30 sec bil.  Sitting hamstring stretch holding for 30 sec bil.  Supine trunk rotation holding 30 sec bil.  DATE: 04/23/23  EVAL see below  PATIENT EDUCATION: 05/07/23 Education details: Access Code: [email or web address] Person educated: Patient Education method: Explanation, Demonstration, Tactile cues, Verbal cues, and Handouts Education comprehension: verbalized understanding, returned demonstration, verbal cues required, tactile cues required, and needs further education   HOME EXERCISE PROGRAM: 05/07/23 Access Code: [email or web address] URL: [email or web address] Date: 05/07/2023 Prepared by: [name]  Exercises - Seated Piriformis Stretch with Trunk Bend  - 1 x daily - 7 x weekly - 1 sets - 2 reps - 30 sec hold - Seated Hamstring Stretch  - 1 x daily - 7 x weekly - 1 sets - 2 reps - 30 sec hold - Supine Piriformis Stretch with Leg Straight  - 1 x daily - 7 x weekly - 1 sets - 2 reps - 30 sec hold - Happy Baby with Pelvic Floor Lengthening  - 1 x daily - 7 x weekly - 1 sets - 1 reps - 30 sec hold - Supine Abdominal Wall Massage  - 1 x daily - 7 x weekly - 3 sets - 10 reps  ASSESSMENT:  CLINICAL IMPRESSION: Patient is a 62 y.o. female who was  seen today for physical therapy  treatment for myalgia. Patient has had pelvic pain for several years. Patient had an intestinal virus last week so we did not assess the pelvic floor strength. Patient has restrictions in the abdomen with some tenderness. She understands how to perform manual work to the abdomen.  Patient will benefit from skilled therapy to reduce her pain and improve ease of bowel movements.   OBJECTIVE IMPAIRMENTS: decreased coordination, decreased endurance, decreased strength, increased fascial restrictions, increased muscle spasms, and pain.   ACTIVITY LIMITATIONS: sitting and toileting  PARTICIPATION LIMITATIONS: meal prep, cleaning, interpersonal relationship, and community activity  PERSONAL FACTORS: Age, Fitness, Time since onset of injury/illness/exacerbation, and 1-2 comorbidities: depression, recurrent UTI  are also affecting patient's functional outcome.   REHAB POTENTIAL: Good  CLINICAL DECISION MAKING: Evolving/moderate complexity  EVALUATION COMPLEXITY: Moderate   GOALS: Goals reviewed with patient? Yes  SHORT TERM GOALS: Target date: 05/20/23  Patient independent with hip stretches to elongate the pelvic floor muscles.  Baseline: Goal status: INITIAL  2.  Patient is independent with abdominal massage to promote peristalic motion of the intestines.  Baseline:  Goal status: INITIAL  3.  Patient is educated on perineal massage to assist with elongation of the tissue to reduce the chance of tearing.  Baseline:  Goal status: INITIAL   LONG TERM GOALS: Target date: 06/18/23  Patient is independent with advanced HEP for pelvic floor lengthening.  Baseline:  Goal status: INITIAL  2.  Patient is able to have vaginal penetration with pain level >/= 2-3/10 due to the ability to relax the pelvic floor with diaphragmatic breathing.  Baseline:  Goal status: INITIAL  3.  Patient reports straining with bowel movements decreased >/= 2/10 due to the ability  to relax the pelvic floor while pushing the stool out.  Baseline:  Goal status: INITIAL  4.  Patient is able to sit with discomfort </= 3/10 due to reduction of trigger points.  Baseline:  Goal status: INITIAL  5.  PFIQ-7 goes from 52 to 20 deu to decreased pain and decreased frustration.  Baseline:  Goal status: INITIAL   PLAN:  PT FREQUENCY: 1x/week  PT DURATION: 1 week  PLANNED INTERVENTIONS: Therapeutic exercises, Therapeutic activity, Neuromuscular re-education, Balance training, Patient/Family education, Joint mobilization, Dry Needling, Electrical stimulation, Cryotherapy, Moist heat, Ultrasound, Biofeedback, and Manual therapy  PLAN FOR NEXT SESSION: assess pelvic floor if patient is up to it; review abdominal massage,  diaphragmatic breathing   [name], PT 05/07/23 10:20 AM

## 2023-05-07 NOTE — Telephone Encounter (Signed)
Left message for pt to call back  °

## 2023-05-08 ENCOUNTER — Ambulatory Visit (INDEPENDENT_AMBULATORY_CARE_PROVIDER_SITE_OTHER): Payer: 59 | Admitting: Clinical

## 2023-05-08 DIAGNOSIS — F4323 Adjustment disorder with mixed anxiety and depressed mood: Secondary | ICD-10-CM

## 2023-05-08 DIAGNOSIS — Z658 Other specified problems related to psychosocial circumstances: Secondary | ICD-10-CM

## 2023-05-08 NOTE — Telephone Encounter (Signed)
Left Message for pt to call back  Unable to reach pt after multiple unsuccessful attempts. Appointment has been canceled.

## 2023-05-08 NOTE — Telephone Encounter (Signed)
noted 

## 2023-05-08 NOTE — Telephone Encounter (Signed)
PT returned call. I advised her of cancelled appointment. She wanted [place] to know that the ED had helped her situation so the appointment is not needed

## 2023-05-10 ENCOUNTER — Ambulatory Visit: Payer: 59 | Admitting: Physician Assistant

## 2023-05-13 NOTE — Progress Notes (Deleted)
Office Visit Note  Patient: Nicole [name]             Date of Birth: [birth year]           MRN: [id number]             PCP: Miguel Aschoff, MD Referring: Miguel Aschoff, MD Visit Date: 05/24/2023   Subjective:  No chief complaint on file.   History of Present Illness: Nicole [name] is a 62 y.o. female here for follow up with ongoing joint pain particularly bothering her at the shoulders.    Previous HPI 11/29/2022 Nicole [name] is a 62 y.o. female here for follow up with ongoing joint pain particularly bothering her at the shoulders.  Since her last visit right shoulder pain is doing better but her left shoulder is currently causing more problems.  Gets pain with lifting and pulling activities worst across the top of the shoulder area.  She did not end up using the topical Voltaren at all because she was not very confident about the correct way to do so and prefers avoiding medications is much as possible.  Otherwise she is not experiencing any radiation of symptoms no increased joint pain or swelling elsewhere.  She was scheduled for bone density testing in August of this year.     Previous HPI 10/17/22 Nicole [name] is a 62 y.o. female here for follow up for the previous findings of morphea and also with joint pain in several areas most very currently in the right shoulder.  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  She has been recommended to use compression socks to minimize venous stasis but not on systemic medical therapy.  Otherwise has been doing pretty well overall though she has noticed some overall worsening with her energy and health since decreased physical activity.  She has been working on increasing this again but has right shoulder pain this initially started while walking.  Now it is becoming more painful when she  tries to reach across the body and it somewhat alleviated with reaching overhead.  Symptoms are not radiating anywhere she has no known history of serious injury to the shoulder. She is also concerned about bone density and interested in screening for osteoporosis.   Previous HPI 01/01/22 Nicole [name] is a 63 y.o. female here for morphea evaluation for possible systemic sclerosis. She has been diagnosed by dermatology biopsy of affected area on her leg. She has skin rashes in multiple areas she estimates for about 20 years in total not sure about when these have progressed to become more noticeable.  Is associated with loss of hair as well worst in bilateral temporal areas and in her distal legs.  She has a few hyperpigmented skin patches and some areas of thickening or indentations.  Some hypopigmented areas as well but smaller spots and this is also been a scattered distribution including her torso.  She has noticed tightness or catching and tendons on the top of her feet that comes and goes feels like this takes several minutes until release and get back to normal range of movement.  She notices some swelling intermittently in her hands worse when walking and standing for prolonged time.  She saw pulmonology for assessment with no significant CT abnormalities. GI workup is planned for evaluating esophageal dysmotility. Concern is also for possible esophageal  stricture or scarring related to chronic GERD.    No Rheumatology ROS completed.   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 01/18/2022   Sensation of food items getting stuck in upper esophagus to be evaluated first by esophagram and then by EGD with Dr. Leone Payor over the next few weeks.   Hypercholesterolemia    Hypotension    Morphea 2022   Nodule of skin of breast 01/04/2021   Obesity    OSA (obstructive sleep apnea) 12/27/2015   Palpitations    negative cardiac w/u per Dr. Daleen Squibb 2/09   Reflux esophagitis    Sleep apnea    No CPAP   Vaginal discharge 03/02/2020    Family History  Problem Relation Age of Onset   Hypertension Mother    Diabetes Mother    Heart disease Mother    Thyroid disease Mother    Glaucoma Mother    Diabetes Father    Hypertension Sister    Diabetes Sister    Diabetes Sister    Hypertension Sister    Sleep apnea Sister    Hypertension Sister    Diabetes Sister    Hypertension  Brother    Diabetes Brother    Pancreatic cancer Maternal Uncle    Heart attack Maternal Grandmother    Heart attack Maternal Grandfather    Mental illness Son    Mental illness Son    Pancreatic cancer Other        family history   Colon cancer Neg Hx    Past Surgical History:  Procedure Laterality Date   COLONOSCOPY     ECTOPIC PREGNANCY SURGERY     EXPLORATORY LAPAROTOMY  2000   x2 (Dr. Dierdre Forth)   HERPES SIMPLEX VIRUS DFA     TUBAL LIGATION  1987   Social History   Social History Narrative   Current Social History 02/16/2021        Patient lives alone in a [place] which is 2  stories. There are 13 steps with handrails up to the entrance the patient uses.       Patient's method of transportation is city bus.      The highest level of education was high school diploma.      The patient currently disabled.      Identified important Relationships are "My mother, my sisters, children, grandchildren (5)"       Pets : None       Interests / Fun: "Read, watch TV, movies, social media, talk on phone, shopping, travel"       Current Stressors: "Simple people get on my nerves,;drama."       Religious / Personal Beliefs: "Christian, [place], Pentecostal."       Other: "I stay away from people."      L. Ducatte, BSN, RN-BC              Immunization History  Administered Date(s) Administered   Td 10/18/2005   Tdap 10/27/2012     Objective: Vital Signs: LMP 03/03/2016 (Approximate)    Physical Exam   Musculoskeletal Exam: ***  CDAI Exam: CDAI Score: -- Patient Global: --; Provider Global: -- Swollen: --; Tender: -- Joint Exam 05/24/2023   No joint exam has been documented for this visit   There is currently no information documented on the homunculus. Go to the Rheumatology activity and complete the homunculus joint exam.  Investigation: No additional findings.  Imaging: CT ABDOMEN PELVIS W CONTRAST  Result Date: 05/02/2023 CLINICAL DATA:   Diarrhea since Sunday. EXAM: CT ABDOMEN AND PELVIS WITH CONTRAST TECHNIQUE: Multidetector CT imaging of the abdomen and pelvis was performed using the standard protocol following bolus administration of intravenous contrast. RADIATION DOSE REDUCTION: This exam was performed according to the departmental dose-optimization program which includes automated exposure control, adjustment of the mA and/or kV according to patient size and/or use of iterative reconstruction technique. CONTRAST:  OMNIPAQUE IOHEXOL 300 MG/ML  SOLN COMPARISON:  02/12/2023 CT and ultrasound pelvis FINDINGS: Lower chest: There is some linear opacity lung bases likely scar or atelectasis. No pleural effusion. Hepatobiliary: Mild fatty liver infiltration. No space-occupying liver lesion. Gallbladder is nondilated. Patent portal vein. Pancreas: Unremarkable. No pancreatic ductal dilatation or surrounding inflammatory changes. Spleen: Normal in size without focal abnormality. Adrenals/Urinary Tract: Adrenal glands are preserved. No enhancing renal mass. Lower pole tiny left-sided renal cyst is stable, Bosniak 1 lesion. No imaging follow-up. The ureters have normal course and caliber extending down to the urinary bladder. Preserved contours of the urinary bladder. Stomach/Bowel: On this non oral contrast exam large bowel is of normal course and caliber. There is scattered colonic stool. Normal appendix. The stomach and small bowel are nondilated. Vascular/Lymphatic: No significant vascular findings are present. No enlarged abdominal or pelvic lymph nodes. Reproductive: Uterus and bilateral adnexa are unremarkable. Other: No abdominal wall hernia or abnormality. No abdominopelvic ascites. Musculoskeletal: Curvature of the spine. Scattered degenerative changes. Degenerative changes of the pelvis. IMPRESSION: No bowel obstruction, free air or free fluid. Normal appendix. Scattered stool. Fatty liver infiltration. Electronically Signed   By: Karen Kays M.D.   On: 05/02/2023 17:53    Recent Labs: Lab Results  Component Value Date   WBC 3.5 (L) 05/02/2023   HGB 12.0 05/02/2023   PLT 201 05/02/2023   NA 140 05/02/2023   K 4.3 05/02/2023   CL 107 05/02/2023   CO2 27 05/02/2023   GLUCOSE 97 05/02/2023   BUN 14 05/02/2023   CREATININE 0.73  05/02/2023   BILITOT 0.6 05/02/2023   ALKPHOS 57 05/02/2023   AST 20 05/02/2023   ALT 30 05/02/2023   PROT 6.8 05/02/2023   ALBUMIN 4.1 05/02/2023   CALCIUM 9.2 05/02/2023   GFRAA 85 09/25/2019   QFTBGOLD Negative 03/14/2017    Speciality Comments: No specialty comments available.  Procedures:  No procedures performed Allergies: Other   Assessment / Plan:     Visit Diagnoses: No diagnosis found.  ***  Orders: No orders of the defined types were placed in this encounter.  No orders of the defined types were placed in this encounter.    Follow-Up Instructions: No follow-ups on file.   Metta Clines, RT  Note - This record has been created using [employer].  Chart creation errors have been sought, but may not always  have been located. Such creation errors do not reflect on  the standard of medical care.

## 2023-05-13 NOTE — BH Specialist Note (Deleted)
Integrated Behavioral Health Follow Up In-Person Visit  MRN: [id number] Name: Nicole [name]  Number of Integrated Behavioral Health Clinician visits: 3- Third Visit  Session Start time: [phone]   Session End time: [phone]  Total time in minutes: 33   Types of Service: Individual psychotherapy  Interpretor:No. Interpretor Name and Language: n/a  Subjective: Nicole [name] is a 62 y.o. female accompanied by {Patient accompanied by:[phone]} Patient was referred by *** for ***. Patient reports the following symptoms/concerns: *** Duration of problem: ***; Severity of problem: {Mild/Moderate/Severe:20260}  Objective: Mood: {BHH MOOD:22306} and Affect: {BHH AFFECT:22307} Risk of harm to self or others: {CHL AMB BH Suicide Current Mental Status:21022748}  Life Context: Family and Social: *** School/Work: *** Self-Care: *** Life Changes: ***  Patient and/or Family's Strengths/Protective Factors: {CHL AMB BH PROTECTIVE FACTORS:[phone]}  Goals Addressed: Patient will:  Reduce symptoms of: {IBH Symptoms:21014056}   Increase knowledge and/or ability of: {IBH Patient Tools:21014057}   Demonstrate ability to: {IBH Goals:21014053}  Progress towards Goals: {CHL AMB BH PROGRESS TOWARDS GOALS:[phone]}  Interventions: Interventions utilized:  {IBH Interventions:21014054} Standardized Assessments completed: {IBH Screening Tools:21014051}  Patient and/or Family Response: Patient agrees with treatment plan.   Patient Centered Plan: Patient is on the following Treatment Plan(s): IBH Assessment: Patient currently experiencing ***.   Patient may benefit from continued therapeutic intervention *** .  Plan: Follow up with behavioral health clinician on : *** Behavioral recommendations:  -*** -*** Referral(s): {IBH Referrals:21014055}  Rae Lips, LCSW     03/18/2023   10:07 AM 02/13/2023    2:12 PM 12/27/2022    8:40 AM 09/25/2022    4:37 PM 09/06/2022   11:28 AM   Depression screen PHQ 2/9  Decreased Interest 1 1 0 0 1  Down, Depressed, Hopeless 0 0 0 0 0  PHQ - 2 Score 1 1 0 0 1  Altered sleeping 1 1  1 1   Tired, decreased energy 1 1  1 1   Change in appetite 1 0  0 1  Feeling bad or failure about yourself  0 1  0 0  Trouble concentrating 1 1  1  0  Moving slowly or fidgety/restless 0 0  0 0  Suicidal thoughts 0 0  0 0  PHQ-9 Score 5 5  3 4   Difficult doing work/chores    Somewhat difficult       03/18/2023   10:07 AM 02/13/2023    2:12 PM 08/22/2022    4:23 PM 08/25/2021   11:00 AM  GAD 7 : Generalized Anxiety Score  Nervous, Anxious, on Edge 1 1 1 1   Control/stop worrying 0 1 1 0  Worry too much - different things 1 1 1  0  Trouble relaxing 0 0 1 1  Restless 0 0 0 0  Easily annoyed or irritable 1 1 1 1   Afraid - awful might happen 1 0 0 0  Total GAD 7 Score 4 4 5  3

## 2023-05-16 IMAGING — CR DG SHOULDER 2+V*R*
3 series · 3 of 3 positions shown · non-contrast
Comparison: None.

CLINICAL DATA: Right shoulder pain. Patient fell a few times this
year.

EXAM:
RIGHT SHOULDER - 2+ VIEW

[shoulder grashey]
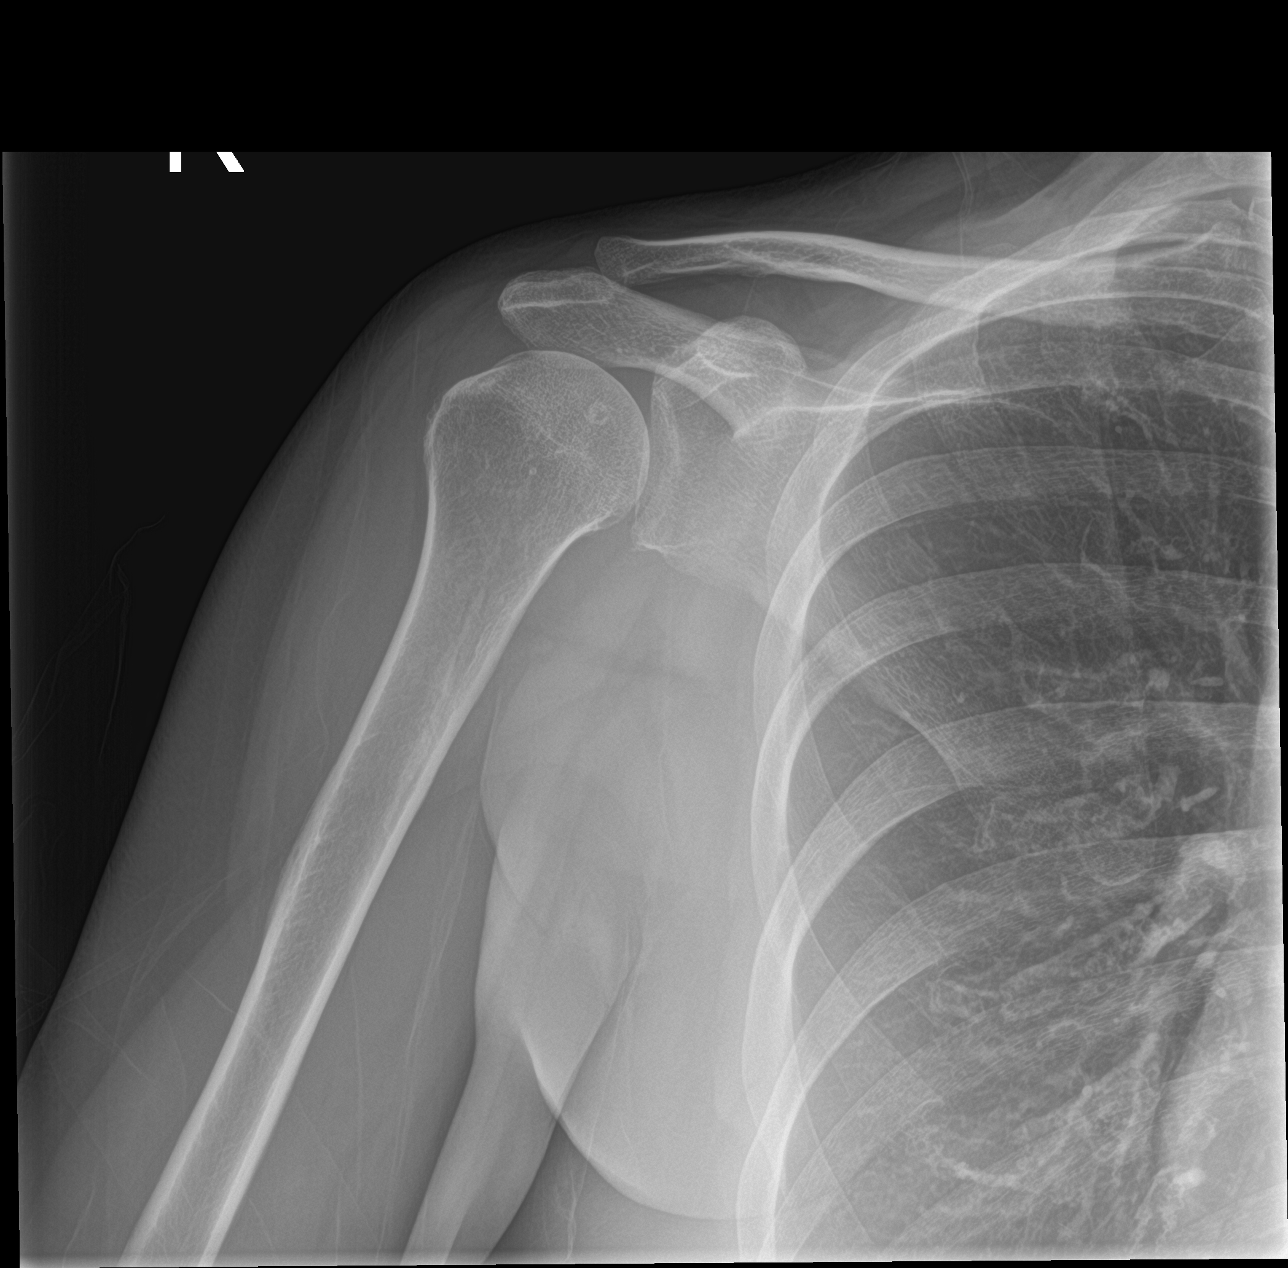

[shoulder y view]
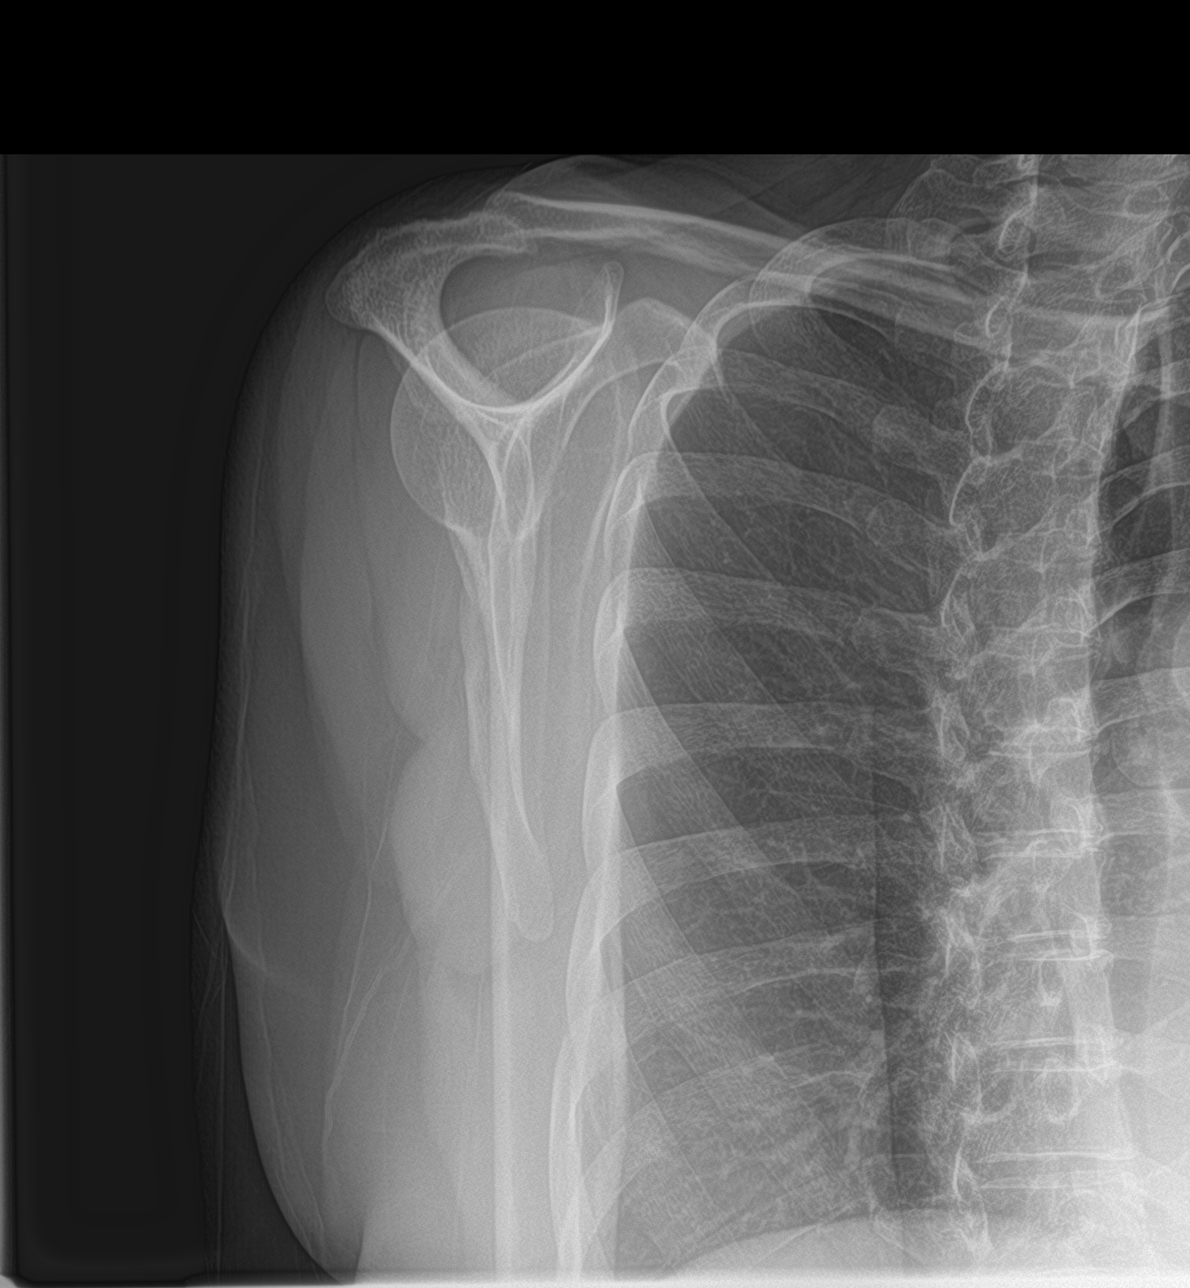

[shoulder axillary]
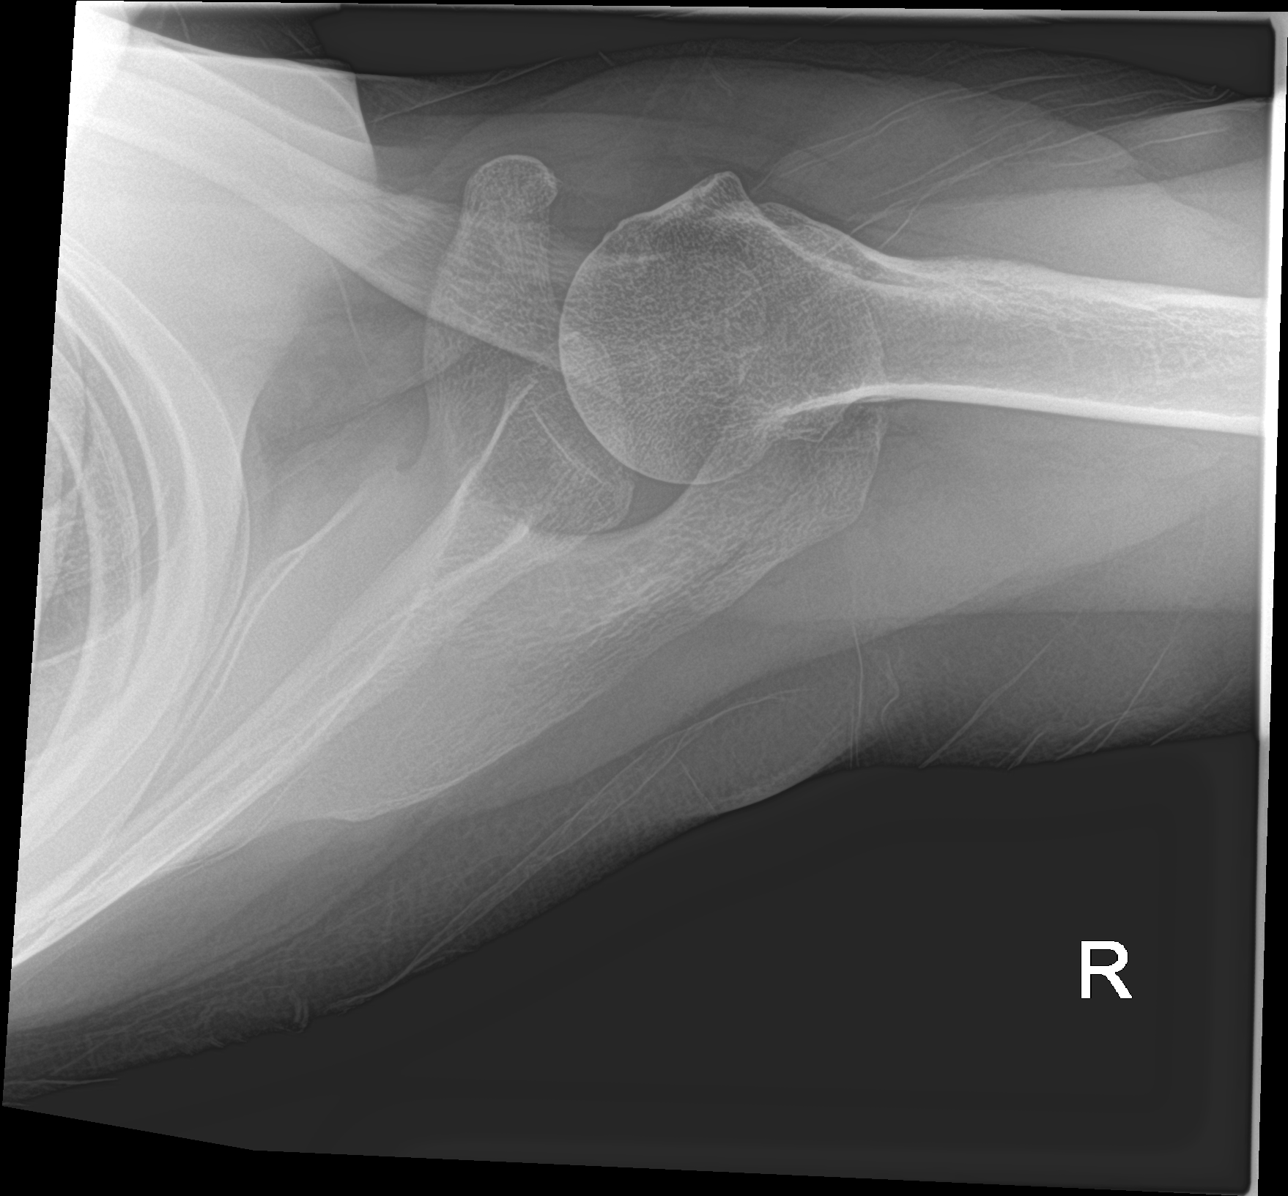

[3 of 3 positions shown; findings below may reference images not displayed]

FINDINGS: There is no evidence of fracture or dislocation. There is no
evidence of arthropathy or other focal bone abnormality. Soft
tissues are unremarkable.
IMPRESSION: Negative.

## 2023-05-17 ENCOUNTER — Telehealth: Payer: Self-pay

## 2023-05-17 NOTE — Telephone Encounter (Signed)
Patient contacted the office to inquire the date and time of her next appointment. Advised the patient her appointment is on 05/24/2023 and 8:00 am. Patient verbalized understanding.

## 2023-05-24 ENCOUNTER — Ambulatory Visit: Payer: 59 | Admitting: Internal Medicine

## 2023-05-24 DIAGNOSIS — S4991XS Unspecified injury of right shoulder and upper arm, sequela: Secondary | ICD-10-CM

## 2023-05-24 DIAGNOSIS — R937 Abnormal findings on diagnostic imaging of other parts of musculoskeletal system: Secondary | ICD-10-CM

## 2023-05-24 DIAGNOSIS — G8929 Other chronic pain: Secondary | ICD-10-CM

## 2023-05-27 ENCOUNTER — Ambulatory Visit: Payer: 59

## 2023-05-31 ENCOUNTER — Ambulatory Visit: Payer: 59 | Admitting: Internal Medicine

## 2023-05-31 ENCOUNTER — Telehealth: Payer: Self-pay

## 2023-05-31 NOTE — Telephone Encounter (Signed)
Transition Care Management Follow-up Telephone Call Date of discharge and from where: 05/02/2023 [facility] How have you been since you were released from the hospital? Patient stated she is feeling much better and is very grateful for the excellent care she received in the ED. Any questions or concerns? No  Items Reviewed: Did the pt receive and understand the discharge instructions provided? Yes  Medications obtained and verified? Yes  Other? No  Any new allergies since your discharge? No  Dietary orders reviewed? Yes Do you have support at home? Yes   Follow up appointments reviewed:  PCP Hospital f/u appt confirmed? No  Scheduled to see  on  @ . Specialist Hospital f/u appt confirmed? Yes  Scheduled to see [name], PT on 05/07/2023 @  Outpatient [facility] at [facility]. Are transportation arrangements needed? No  If their condition worsens, is the pt aware to call PCP or go to the Emergency Dept.? Yes Was the patient provided with contact information for the PCP's office or ED? Yes Was to pt encouraged to call back with questions or concerns? Yes   Nicole [name] [facility]  [facility], [facility] Guide Direct Dial: [phone]  Website: [email or web address]

## 2023-06-10 DIAGNOSIS — E559 Vitamin D deficiency, unspecified: Secondary | ICD-10-CM | POA: Diagnosis not present

## 2023-06-10 DIAGNOSIS — W57XXXA Bitten or stung by nonvenomous insect and other nonvenomous arthropods, initial encounter: Secondary | ICD-10-CM | POA: Diagnosis not present

## 2023-06-10 DIAGNOSIS — F17200 Nicotine dependence, unspecified, uncomplicated: Secondary | ICD-10-CM | POA: Diagnosis not present

## 2023-06-10 DIAGNOSIS — E782 Mixed hyperlipidemia: Secondary | ICD-10-CM | POA: Diagnosis not present

## 2023-06-10 DIAGNOSIS — R7303 Prediabetes: Secondary | ICD-10-CM | POA: Diagnosis not present

## 2023-06-10 DIAGNOSIS — Z0130 Encounter for examination of blood pressure without abnormal findings: Secondary | ICD-10-CM | POA: Diagnosis not present

## 2023-06-10 DIAGNOSIS — M8589 Other specified disorders of bone density and structure, multiple sites: Secondary | ICD-10-CM | POA: Diagnosis not present

## 2023-06-13 ENCOUNTER — Ambulatory Visit: Payer: 59 | Admitting: Pulmonary Disease

## 2023-06-14 ENCOUNTER — Ambulatory Visit (INDEPENDENT_AMBULATORY_CARE_PROVIDER_SITE_OTHER): Payer: 59 | Admitting: Podiatry

## 2023-06-14 ENCOUNTER — Encounter: Payer: Self-pay | Admitting: Podiatry

## 2023-06-14 DIAGNOSIS — M216X9 Other acquired deformities of unspecified foot: Secondary | ICD-10-CM | POA: Diagnosis not present

## 2023-06-14 DIAGNOSIS — D2371 Other benign neoplasm of skin of right lower limb, including hip: Secondary | ICD-10-CM

## 2023-06-14 NOTE — Progress Notes (Signed)
This patient present to the office  with chief complaint of callus developing under the outside of ball of both feet.  She says this callus has become painful walking and wearing her shoes.  She presents to the office for treatment of her painful callus.  Vascular  Dorsalis pedis and posterior tibial pulses are palpable  B/L.  Capillary return  WNL.  Temperature gradient is  WNL.  Skin turgor  WNL  Sensorium  Senn Weinstein monofilament wire  WNL. Normal tactile sensation.  Nail Exam  Patient has normal nails with no evidence of bacterial or fungal infection.  Orthopedic  Exam  Muscle tone and muscle strength  WNL.  No limitations of motion feet  B/L.  No crepitus or joint effusion noted.  Foot type is unremarkable and digits show no abnormalities.  Bony prominences are unremarkable.  Plantar flexed fifth metatarsal  B/L.  Skin  No open lesions.  Normal skin texture and turgor.  Callus/porokeratosis  sub 5th  B/L  Porokeratosis secondary plantar flexed fifth metatarsal  B/L  Debride callus/porokeratosis.  Discussed condition with patient.  RTC  3 months   Helane Gunther DPM

## 2023-06-16 ENCOUNTER — Emergency Department (HOSPITAL_BASED_OUTPATIENT_CLINIC_OR_DEPARTMENT_OTHER): Payer: 59

## 2023-06-16 ENCOUNTER — Emergency Department (HOSPITAL_COMMUNITY)
Admission: EM | Admit: 2023-06-16 | Discharge: 2023-06-16 | Disposition: A | Discharge status: Home / Self Care | Payer: 59 | Attending: Student | Admitting: Student

## 2023-06-16 ENCOUNTER — Emergency Department (HOSPITAL_COMMUNITY): Payer: 59

## 2023-06-16 ENCOUNTER — Other Ambulatory Visit: Payer: Self-pay

## 2023-06-16 DIAGNOSIS — D649 Anemia, unspecified: Secondary | ICD-10-CM | POA: Insufficient documentation

## 2023-06-16 DIAGNOSIS — M79661 Pain in right lower leg: Secondary | ICD-10-CM | POA: Diagnosis not present

## 2023-06-16 DIAGNOSIS — M79604 Pain in right leg: Secondary | ICD-10-CM

## 2023-06-16 DIAGNOSIS — D72819 Decreased white blood cell count, unspecified: Secondary | ICD-10-CM | POA: Diagnosis not present

## 2023-06-16 DIAGNOSIS — M79601 Pain in right arm: Secondary | ICD-10-CM | POA: Diagnosis not present

## 2023-06-16 DIAGNOSIS — R079 Chest pain, unspecified: Secondary | ICD-10-CM | POA: Diagnosis not present

## 2023-06-16 DIAGNOSIS — R0789 Other chest pain: Secondary | ICD-10-CM | POA: Diagnosis not present

## 2023-06-16 LAB — BASIC METABOLIC PANEL
Anion gap: 6 (ref 5–15)
BUN: 11 mg/dL (ref 8–23)
CO2: 26 mmol/L (ref 22–32)
Calcium: 9.2 mg/dL (ref 8.9–10.3)
Chloride: 108 mmol/L (ref 98–111)
Creatinine, Ser: 0.69 mg/dL (ref 0.44–1.00)
GFR, Estimated: >60 mL/min (ref >60)
Glucose, Bld: 103 mg/dL — ABNORMAL HIGH (ref 70–99)
Potassium: 3.7 mmol/L (ref 3.5–5.1)
Sodium: 140 mmol/L (ref 135–145)

## 2023-06-16 LAB — CBC WITH DIFFERENTIAL/PLATELET
Abs Immature Granulocytes: 0.01 10*3/uL (ref 0.00–0.07)
Basophils Absolute: 0 10*3/uL (ref 0.0–0.1)
Basophils Relative: 1 %
Eosinophils Absolute: 0.2 10*3/uL (ref 0.0–0.5)
Eosinophils Relative: 5 %
HCT: 34.4 % — ABNORMAL LOW (ref 36.0–46.0)
Hemoglobin: 11.3 g/dL — ABNORMAL LOW (ref 12.0–15.0)
Immature Granulocytes: 0 %
Lymphocytes Relative: 50 %
Lymphs Abs: 1.9 10*3/uL (ref 0.7–4.0)
MCH: 31.3 pg (ref 26.0–34.0)
MCHC: 32.8 g/dL (ref 30.0–36.0)
MCV: 95.3 fL (ref 80.0–100.0)
Monocytes Absolute: 0.4 10*3/uL (ref 0.1–1.0)
Monocytes Relative: 10 %
Neutro Abs: 1.3 10*3/uL — ABNORMAL LOW (ref 1.7–7.7)
Neutrophils Relative %: 34 %
Platelets: 181 10*3/uL (ref 150–400)
RBC: 3.61 MIL/uL — ABNORMAL LOW (ref 3.87–5.11)
RDW: 13.2 % (ref 11.5–15.5)
WBC: 3.9 10*3/uL — ABNORMAL LOW (ref 4.0–10.5)
nRBC: 0 % (ref 0.0–0.2)

## 2023-06-16 LAB — TROPONIN I (HIGH SENSITIVITY): Troponin I (High Sensitivity): 4 ng/L (ref <18)

## 2023-06-16 NOTE — Progress Notes (Signed)
Right upper ext venous  has been completed. Refer to [facility] under chart review to view preliminary results.   06/16/2023  9:25 AM Nicole [name], Gerarda Gunther

## 2023-06-16 NOTE — ED Triage Notes (Signed)
Pt BIB [facility] from home d/t right arm pain and right leg pain.  No injury noted - worried about a blood clot.

## 2023-06-16 NOTE — Discharge Instructions (Signed)
You were seen in the emergency department for leg and arm pain.  As we discussed your blood work and chest x-ray looked reassuring.  The ultrasound of your arm showed no evidence of a blood clot.  I recommend following up with your primary doctor regarding the ongoing pain in your leg in case they want to schedule another ultrasound.  I recommend taking some over-the-counter allergy medication such as Zyrtec, Claritin, or Allegra, to help with some of the congestion and cough that you are having as this may be more of an allergy to mold rather than an infection.  Continue to monitor how you're doing and return to the ER for new or worsening symptoms.

## 2023-06-16 NOTE — ED Provider Notes (Signed)
Accepted handoff at shift change from [facility]. Please see prior provider note for full HPI.  Briefly: Patient is a 62 y.o. female who presents to the ER for R calf aching since yesterday. Hx of superficial thrombophlebitis, patient concerned about blood clot. No hx of blood clots.   DDX/Plan: Pending DVT study.   Physical Exam  BP 113/77   Pulse (!) 58   Temp 98.6 F (37 C) (Oral)   Resp 18   Ht 5\' 5"  (1.651 m)   Wt 78 kg   LMP 03/03/2016 (Approximate)   SpO2 97%   BMI 28.62 kg/m   Physical Exam Vitals and nursing note reviewed.  Constitutional:      Appearance: Normal appearance.  HENT:     Head: Normocephalic and atraumatic.  Eyes:     Conjunctiva/sclera: Conjunctivae normal.  Pulmonary:     Effort: Pulmonary effort is normal. No respiratory distress.  Skin:    General: Skin is warm and dry.  Neurological:     Mental Status: She is alert.  Psychiatric:        Mood and Affect: Mood normal.        Behavior: Behavior normal.    ED Course / MDM    Medical Decision Making Amount and/or Complexity of Data Reviewed Labs: ordered. Radiology: ordered.   When [place] tech came to evaluate patient, patient was complaining of her right arm bothering her more than her leg so DVT study was changed. UE DVT showed no evidence of DVT.   Discussed results with patient. She still has some concerns about her right leg but plans to call her primary doctor to schedule an evaluation and possible imaging.  She is not complaining of any chest pain or shortness of breath, normal vital signs, and I think it is clinically not very likely that she has a DVT, so my concern for PE is low.  Patient also had concern that she has "mold in her lungs" but I discussed with her that her chest x-ray and lab work all look reassuring.  She plans to follow-up with her PCP about this as well.  Advised her to take some over-the-counter medications to help with some of symptoms she is having that sound  more like allergies.  Patient stable for discharge home, and all questions answered.   Tiphanie Vo T, PA-C 06/16/23 1000    Horton, Clabe Seal, DO 06/16/23 1600

## 2023-06-16 NOTE — ED Provider Notes (Signed)
Nicole [name] EMERGENCY DEPARTMENT AT [facility] Provider Note   CSN: [id number] Arrival date & time: 06/16/23  0151     History  Chief Complaint  Patient presents with   Pain    Nicole [name] is a 62 y.o. female who presents with intermittent aching in the right calf, history of superficial blood clot in the leg.  Patient concerned about possible blood clot again today.  No history of DVT or PE, never on intake anticoagulation.  Also endorses chest pain though this occurred primarily when she was most upset about the idea of a blood clot.  I reviewed her medical records.  History of hyperlipidemia, bipolar.   HPI     Home Medications Prior to Admission medications   Medication Sig Start Date End Date Taking? Authorizing Provider  albuterol (VENTOLIN HFA) 108 (90 Base) MCG/ACT inhaler Inhale into the lungs every 6 (six) hours as needed for wheezing or shortness of breath.   Yes [provider]  alendronate (FOSAMAX) 70 MG tablet Take 70 mg by mouth once a week. 06/10/23  Yes [provider]  ondansetron (ZOFRAN) 4 MG tablet Take 1 tablet (4 mg total) by mouth every 6 (six) hours. 05/02/23  Yes Glyn Ade, MD      Allergies    Other    Review of Systems   Review of Systems  Cardiovascular:        Right calf pain    Physical Exam Updated Vital Signs BP 125/77 (BP Location: Left Arm)   Pulse 61   Temp 98.1 F (36.7 C) (Oral)   Resp 16   Ht 5\' 5"  (1.651 m)   Wt 78 kg   LMP 03/03/2016 (Approximate)   SpO2 98%   BMI 28.62 kg/m  Physical Exam Vitals and nursing note reviewed.  Constitutional:      Appearance: She is not ill-appearing or toxic-appearing.  HENT:     Head: Normocephalic and atraumatic.     Mouth/Throat:     Mouth: Mucous membranes are moist.     Pharynx: No oropharyngeal exudate or posterior oropharyngeal erythema.  Eyes:     General:        Right eye: No discharge.        Left eye: No discharge.      Conjunctiva/sclera: Conjunctivae normal.  Cardiovascular:     Rate and Rhythm: Normal rate and regular rhythm.     Pulses: Normal pulses.     Heart sounds: Normal heart sounds. No murmur heard.    Comments: Mild right calf TTP. Pulmonary:     Effort: Pulmonary effort is normal. No respiratory distress.     Breath sounds: Normal breath sounds. No wheezing or rales.  Abdominal:     General: Bowel sounds are normal. There is no distension.     Palpations: Abdomen is soft.     Tenderness: There is no abdominal tenderness. There is no guarding or rebound.  Musculoskeletal:        General: No deformity.     Cervical back: Neck supple.  Skin:    General: Skin is warm and dry.     Capillary Refill: Capillary refill takes less than 2 seconds.  Neurological:     General: No focal deficit present.     Mental Status: She is alert and oriented to person, place, and time. Mental status is at baseline.  Psychiatric:        Mood and Affect: Mood normal.     ED  Results / Procedures / Treatments   Labs (all labs ordered are listed, but only abnormal results are displayed) Labs Reviewed  CBC WITH DIFFERENTIAL/PLATELET - Abnormal; Notable for the following components:      Result Value   WBC 3.9 (*)    RBC 3.61 (*)    Hemoglobin 11.3 (*)    HCT 34.4 (*)    Neutro Abs 1.3 (*)    All other components within normal limits  BASIC METABOLIC PANEL - Abnormal; Notable for the following components:   Glucose, Bld 103 (*)    All other components within normal limits  TROPONIN I (HIGH SENSITIVITY)  TROPONIN I (HIGH SENSITIVITY)    EKG None  Radiology DG Chest 2 View  Result Date: 06/16/2023 CLINICAL DATA:  62 year old female with history of chest pain. EXAM: CHEST - 2 VIEW COMPARISON:  Chest x-ray 05/21/2022. FINDINGS: Lung volumes are normal. No consolidative airspace disease. No pleural effusions. No pneumothorax. No pulmonary nodule or mass noted. Pulmonary vasculature and the  cardiomediastinal silhouette are within normal limits. IMPRESSION: No radiographic evidence of acute cardiopulmonary disease. Electronically Signed   By: Trudie Reed M.D.   On: 06/16/2023 05:45    Procedures Procedures    Medications Ordered in ED Medications - No data to display  ED Course/ Medical Decision Making/ A&P                                 Medical Decision Making 62 year old female with chest pain and right calf pain  Normal vitals on intake.  Cardiopulmonary sounds normal, abdominal exam is benign  Mild right calf tenderness palpation.  Normal cardiopulmonary exam.  Amount and/or Complexity of Data Reviewed Labs: ordered.    Details: CBC with leukopenia at patient's baseline, mild anemia with hemoglobin 11.3 near patient's baseline.  BMP unremarkable, troponin negative.  Radiology: ordered.    Details:  Chest x-ray negative for acute cardiopulmonary disease ECG/medicine tests:     Details:  EKG with normal sinus rhythm.   Care of this patient signed out to oncoming ED provider L. Roemhildt, PA-C at time of shift change. All pertinent HPI, physical exam, and laboratory findings were discussed with them prior to my departure. Disposition of patient pending completion of workup, reevaluation, and clinical judgement of oncoming ED provider.   This chart was dictated using voice recognition software, Dragon. Despite the best efforts of this provider to proofread and correct errors, errors may still occur which can change documentation meaning.         Final Clinical Impression(s) / ED Diagnoses Final diagnoses:  None    Rx / DC Orders ED Discharge Orders     None         Sherrilee Gilles 06/16/23 0721    Glendora Score, MD 06/17/23 [phone]

## 2023-06-17 ENCOUNTER — Emergency Department (HOSPITAL_COMMUNITY): Payer: 59

## 2023-06-17 ENCOUNTER — Encounter (HOSPITAL_COMMUNITY): Payer: Self-pay | Admitting: Emergency Medicine

## 2023-06-17 ENCOUNTER — Emergency Department (HOSPITAL_COMMUNITY)
Admission: EM | Admit: 2023-06-17 | Discharge: 2023-06-18 | Disposition: A | Discharge status: Home / Self Care | Payer: 59 | Attending: Emergency Medicine | Admitting: Emergency Medicine

## 2023-06-17 DIAGNOSIS — R0789 Other chest pain: Secondary | ICD-10-CM | POA: Diagnosis not present

## 2023-06-17 DIAGNOSIS — R079 Chest pain, unspecified: Secondary | ICD-10-CM | POA: Insufficient documentation

## 2023-06-17 DIAGNOSIS — I2510 Atherosclerotic heart disease of native coronary artery without angina pectoris: Secondary | ICD-10-CM | POA: Diagnosis not present

## 2023-06-17 DIAGNOSIS — R55 Syncope and collapse: Secondary | ICD-10-CM | POA: Diagnosis not present

## 2023-06-17 LAB — CBC
HCT: 36.5 % (ref 36.0–46.0)
Hemoglobin: 12.2 g/dL (ref 12.0–15.0)
MCH: 31.4 pg (ref 26.0–34.0)
MCHC: 33.4 g/dL (ref 30.0–36.0)
MCV: 94.1 fL (ref 80.0–100.0)
Platelets: 222 10*3/uL (ref 150–400)
RBC: 3.88 MIL/uL (ref 3.87–5.11)
RDW: 13.1 % (ref 11.5–15.5)
WBC: 4.5 10*3/uL (ref 4.0–10.5)
nRBC: 0 % (ref 0.0–0.2)

## 2023-06-17 LAB — BASIC METABOLIC PANEL
Anion gap: 9 (ref 5–15)
BUN: 14 mg/dL (ref 8–23)
CO2: 25 mmol/L (ref 22–32)
Calcium: 9.7 mg/dL (ref 8.9–10.3)
Chloride: 106 mmol/L (ref 98–111)
Creatinine, Ser: 0.86 mg/dL (ref 0.44–1.00)
GFR, Estimated: >60 mL/min (ref >60)
Glucose, Bld: 122 mg/dL — ABNORMAL HIGH (ref 70–99)
Potassium: 3.6 mmol/L (ref 3.5–5.1)
Sodium: 140 mmol/L (ref 135–145)

## 2023-06-17 LAB — TROPONIN I (HIGH SENSITIVITY): Troponin I (High Sensitivity): 5 ng/L (ref <18)

## 2023-06-17 NOTE — ED Notes (Signed)
Pt here from home with some cp, no sob no n/v

## 2023-06-18 ENCOUNTER — Emergency Department (HOSPITAL_COMMUNITY): Payer: 59

## 2023-06-18 DIAGNOSIS — R079 Chest pain, unspecified: Secondary | ICD-10-CM | POA: Diagnosis not present

## 2023-06-18 DIAGNOSIS — I2510 Atherosclerotic heart disease of native coronary artery without angina pectoris: Secondary | ICD-10-CM | POA: Diagnosis not present

## 2023-06-18 DIAGNOSIS — R55 Syncope and collapse: Secondary | ICD-10-CM | POA: Diagnosis not present

## 2023-06-18 LAB — TROPONIN I (HIGH SENSITIVITY): Troponin I (High Sensitivity): 3 ng/L (ref <18)

## 2023-06-18 MED ORDER — IOHEXOL 350 MG/ML SOLN
75.0000 mL | Freq: Once | INTRAVENOUS | Status: AC | PRN
  Administered 2023-06-18: 75 mL via INTRAVENOUS

## 2023-06-18 NOTE — Progress Notes (Signed)
Lower extremity venous duplex completed. Please see CV Procedures for preliminary results.  Shona Simpson, RVT 06/18/23 9:16 AM

## 2023-06-18 NOTE — ED Notes (Signed)
Pt name called 3x at 0429, pt did not answer.

## 2023-06-18 NOTE — ED Provider Notes (Signed)
EMERGENCY DEPARTMENT AT [facility] Provider Note   CSN: [id number] Arrival date & time: 06/17/23  1754     History  Chief Complaint  Patient presents with   Chest Pain    Nicole [name] is a 62 y.o. female presenting to ED with chest pain.  Patient reports yesterday having palpitations and chest pressure.  These have resolved.  On 06/16/23 she went to [facility] ED concerned about a bump in her right arm and pain in her right calf.  She had DVT ultrasound right UPPER extremity but refused lower extremity ultrasound because she "wasn't concerned about that issue."  Now she is concerned she may have a clot in her lower extremity.  Sees Dr Antoine Poche cardiology - was scheduled for coronary CTA in February due to CV risk factors, but refused scan because she was nervous about needing premedication with beta blocker.  She also reports history of aborted cardiac cath attempt because her IV was not functioning well and she became nervous about the procedure on the table, calling it off.  Per cardiology records she suffers from chronic chest pain and was referred to see them due to chest pain  Note the patient did not respond to triage call last night for rooming, and therefore had a 13 hour wait in waiting room overnight prior to being roomed and seen by myself this morning at 0700.  HPI     Home Medications Prior to Admission medications   Medication Sig Start Date End Date Taking? Authorizing Provider  albuterol (VENTOLIN HFA) 108 (90 Base) MCG/ACT inhaler Inhale into the lungs every 6 (six) hours as needed for wheezing or shortness of breath.    [provider]  alendronate (FOSAMAX) 70 MG tablet Take 70 mg by mouth once a week. 06/10/23   [provider]  ondansetron (ZOFRAN) 4 MG tablet Take 1 tablet (4 mg total) by mouth every 6 (six) hours. 05/02/23   Glyn Ade, MD      Allergies    Other    Review of Systems   Review of Systems  Physical  Exam Updated Vital Signs BP 132/73   Pulse 65   Temp 97.9 F (36.6 C) (Oral)   Resp 19   LMP 03/03/2016 (Approximate)   SpO2 100%  Physical Exam Constitutional:      General: She is not in acute distress. HENT:     Head: Normocephalic and atraumatic.  Eyes:     Conjunctiva/sclera: Conjunctivae normal.     Pupils: Pupils are equal, round, and reactive to light.  Cardiovascular:     Rate and Rhythm: Normal rate and regular rhythm.  Pulmonary:     Effort: Pulmonary effort is normal. No respiratory distress.  Abdominal:     General: There is no distension.     Tenderness: There is no abdominal tenderness.  Skin:    General: Skin is warm and dry.  Neurological:     General: No focal deficit present.     Mental Status: She is alert. Mental status is at baseline.  Psychiatric:        Mood and Affect: Mood normal.        Behavior: Behavior normal.     ED Results / Procedures / Treatments   Labs (all labs ordered are listed, but only abnormal results are displayed) Labs Reviewed  BASIC METABOLIC PANEL - Abnormal; Notable for the following components:      Result Value   Glucose, Bld 122 (*)  All other components within normal limits  CBC  TROPONIN I (HIGH SENSITIVITY)  TROPONIN I (HIGH SENSITIVITY)    EKG EKG Interpretation Date/Time:  Tuesday June 18 2023 07:15:20 EDT Ventricular Rate:  66 PR Interval:  134 QRS Duration:  87 QT Interval:  405 QTC Calculation: 425 R Axis:   50  Text Interpretation: Sinus rhythm Confirmed by Alvester Chou [phone]) on 06/18/2023 7:38:33 AM  Radiology VAS [place] LOWER EXTREMITY VENOUS (DVT) (ONLY [facility] & [facility])  Result Date: 06/18/2023  Lower Venous DVT Study Patient Name:  Nicole [name]  Date of Exam:   06/18/2023 Medical Rec #: [id number]    Accession #:    [id number] Date of Birth: 1960-12-10   Patient Gender: F Patient Age:   18 years Exam Location:  [facility] Procedure:      VAS [place] LOWER EXTREMITY VENOUS (DVT) Referring  Phys: Detroit Frieden --------------------------------------------------------------------------------  Indications: Pain.  Risk Factors: Past pregnancy. Comparison Study: No prior study Performing Technologist: Shona Simpson  Examination Guidelines: A complete evaluation includes B-mode imaging, spectral Doppler, color Doppler, and power Doppler as needed of all accessible portions of each vessel. Bilateral testing is considered an integral part of a complete examination. Limited examinations for reoccurring indications may be performed as noted. The reflux portion of the exam is performed with the patient in reverse Trendelenburg.  +---------+---------------+---------+-----------+----------+--------------+ RIGHT    CompressibilityPhasicitySpontaneityPropertiesThrombus Aging +---------+---------------+---------+-----------+----------+--------------+ CFV      Full           Yes      Yes                                 +---------+---------------+---------+-----------+----------+--------------+ SFJ      Full                                                        +---------+---------------+---------+-----------+----------+--------------+ FV Prox  Full                                                        +---------+---------------+---------+-----------+----------+--------------+ FV Mid   Full                                                        +---------+---------------+---------+-----------+----------+--------------+ FV DistalFull                                                        +---------+---------------+---------+-----------+----------+--------------+ PFV      Full                                                        +---------+---------------+---------+-----------+----------+--------------+  POP      Full           Yes      Yes                                 +---------+---------------+---------+-----------+----------+--------------+ PTV      Full                                                         +---------+---------------+---------+-----------+----------+--------------+ PERO     Full                                                        +---------+---------------+---------+-----------+----------+--------------+   +----+---------------+---------+-----------+----------+--------------+ LEFTCompressibilityPhasicitySpontaneityPropertiesThrombus Aging +----+---------------+---------+-----------+----------+--------------+ CFV Full           Yes      Yes                                 +----+---------------+---------+-----------+----------+--------------+     Summary: RIGHT: - There is no evidence of deep vein thrombosis in the lower extremity.  - No cystic structure found in the popliteal fossa.  LEFT: - No evidence of common femoral vein obstruction.   *See table(s) above for measurements and observations.    Preliminary    CT Angio Chest PE W and/or Wo Contrast  Result Date: 06/18/2023 CLINICAL DATA:  62 year old female with history of syncope. EXAM: CT ANGIOGRAPHY CHEST WITH CONTRAST TECHNIQUE: Multidetector CT imaging of the chest was performed using the standard protocol during bolus administration of intravenous contrast. Multiplanar CT image reconstructions and MIPs were obtained to evaluate the vascular anatomy. RADIATION DOSE REDUCTION: This exam was performed according to the departmental dose-optimization program which includes automated exposure control, adjustment of the mA and/or kV according to patient size and/or use of iterative reconstruction technique. CONTRAST:  75mL OMNIPAQUE IOHEXOL 350 MG/ML SOLN COMPARISON:  No priors. FINDINGS: Cardiovascular: No filling defects are noted in the pulmonary arterial tree to suggest pulmonary embolism. Heart size is normal. There is no significant pericardial fluid, thickening or pericardial calcification. Atherosclerotic calcifications are noted in the left anterior  descending coronary artery. Mediastinum/Nodes: No pathologically enlarged mediastinal or hilar lymph nodes. Esophagus is unremarkable in appearance. No axillary lymphadenopathy. Lungs/Pleura: No suspicious appearing pulmonary nodules or masses are noted. No acute consolidative airspace disease. No pleural effusions. Upper Abdomen: Unremarkable. Musculoskeletal: There are no aggressive appearing lytic or blastic lesions noted in the visualized portions of the skeleton. Review of the MIP images confirms the above findings. IMPRESSION: 1. No acute findings are noted in the thorax to account for the patient's symptoms. Specifically, no evidence of pulmonary embolism. 2. Atherosclerotic calcifications in the left anterior descending coronary artery. Please note that although the presence of coronary artery calcium documents the presence of coronary artery disease, the severity of this disease and any potential stenosis cannot be assessed on this non-gated CT examination. Assessment for potential risk factor modification, dietary therapy or pharmacologic therapy may be warranted, if clinically indicated. Electronically Signed   By: Brayton Mars.D.  On: 06/18/2023 06:48   DG Chest 2 View  Result Date: 06/17/2023 CLINICAL DATA:  Worsening chest pain EXAM: CHEST - 2 VIEW COMPARISON:  06/16/2023 FINDINGS: The heart size and mediastinal contours are within normal limits. Both lungs are clear. The visualized skeletal structures are unremarkable. IMPRESSION: No active cardiopulmonary disease. Electronically Signed   By: Jasmine Pang M.D.   On: 06/17/2023 21:59    Procedures Procedures    Medications Ordered in ED Medications  iohexol (OMNIPAQUE) 350 MG/ML injection 75 mL (75 mLs Intravenous Contrast Given 06/18/23 1610)    ED Course/ Medical Decision Making/ A&P                                 Medical Decision Making Amount and/or Complexity of Data Reviewed Labs: ordered. Radiology:  ordered.  Risk Prescription drug management.   This patient presents to the Emergency Department with complaint of chest pain. This involves an extensive number of treatment options, and is a complaint that carries with it a high risk of complications and morbidity, given the patient's comorbidity, including high cholesterol.The differential diagnosis includes ACS vs Pneumothorax vs Reflux/Gastritis vs MSK pain vs Pneumonia vs other.  I ordered, reviewed, and interpreted labs.  Pertinent results include no emergent findings.  Delta troponin is negative I ordered imaging studies which included x-ray of the chest, CT PE I independently visualized and interpreted imaging which showed no emergent findings and the monitor tracing which showed sinus rhythm. I agree with the radiologist interpretation External records obtained and reviewed showing cardiology office records from February I personally reviewed the patients ECG which showed sinus rhythm with no acute ischemic finding.  Repeat EKG also shows no acute ischemic findings and is in sinus rhythm  After the interventions stated above, I reevaluated the patient and found that they were stable with no significant or worsening chest pain or discomfort.  No evidence of arrhythmia on patient's telemetry monitoring.  Based on the patient's clinical exam, vital signs, risk factors, and ED testing, I felt that the patient's overall risk of life-threatening emergency such as ACS, PE, sepsis, or infection was low.  At this time, I felt the patient's presentation was most clinically consistent with nonspecific chest pain, but explained to the patient that this evaluation was not a definitive diagnostic workup.  I strongly urged the patient to follow-up with her cardiologist.  She appears more willing to undergo cardiac testing including cardiac CT at this time, after we had a discussion about these medications and imaging process.  She will call her  cardiologist office.  Return precautions were discussed.  At this time I do not believe she is requiring hospitalization as her symptoms are minimal to gone, her vital signs are stable, and she has been observed for nearly 15 hours overnight in the hospital into the ED with no evidence of developing ischemia or other medical emergency.        Final Clinical Impression(s) / ED Diagnoses Final diagnoses:  Chest pain, unspecified type    Rx / DC Orders ED Discharge Orders     None         Terald Sleeper, MD 06/18/23 [phone]

## 2023-06-19 ENCOUNTER — Encounter: Payer: Self-pay | Admitting: Cardiology

## 2023-06-19 ENCOUNTER — Ambulatory Visit: Payer: 59 | Attending: Cardiology | Admitting: Cardiology

## 2023-06-19 VITALS — BP 108/62 | HR 83 | Ht 65.5 in | Wt 169.2 lb

## 2023-06-19 DIAGNOSIS — R072 Precordial pain: Secondary | ICD-10-CM

## 2023-06-19 DIAGNOSIS — I2510 Atherosclerotic heart disease of native coronary artery without angina pectoris: Secondary | ICD-10-CM | POA: Diagnosis not present

## 2023-06-19 MED ORDER — METOPROLOL TARTRATE 50 MG PO TABS: ORAL_TABLET | ORAL | 0 refills | Status: DC

## 2023-06-19 NOTE — Patient Instructions (Addendum)
Medication Instructions:  Continue same medications *If you need a refill on your cardiac medications before your next appointment, please call your pharmacy*   Lab Work: Have a fasting Lipid panel and done this week    Testing/Procedures: Coronary CT will be scheduled after approved by insurance   Follow instructions below   Follow-Up: At [facility], you and your health needs are our priority.  As part of our continuing mission to provide you with exceptional heart care, we have created designated Provider Care Teams.  These Care Teams include your primary Cardiologist (physician) and Advanced Practice Providers (APPs -  Physician Assistants and Nurse Practitioners) who all work together to provide you with the care you need, when you need it.  We recommend signing up for the patient portal called "MyChart".  Sign up information is provided on this After Visit Summary.  MyChart is used to connect with patients for Virtual Visits (Telemedicine).  Patients are able to view lab/test results, encounter notes, upcoming appointments, etc.  Non-urgent messages can be sent to your provider as well.   To learn more about what you can do with MyChart, go to [email or web address].    Your next appointment: 10 months     Provider:  Dr.Hochrein's PA      Your cardiac CT will be scheduled at one of the below locations:   [facility] [address] [place], [place] [address] [phone]  OR  [facility] [address] [place], [place] [address] [phone]  OR   [facility] [address] [place], [place] [address] [phone]  If scheduled at [facility], please arrive at the [facility] En[facility]ance (Entrance C2) of [facility] 30 minutes prior to test start time. You can use the FREE valet parking offered at entrance C (encouraged to control the heart rate for  the test)  Proceed to the [facility] Radiology Department (first floor) to check-in and test prep.  All radiology patients and guests should use entrance C2 at [facility], accessed from [facility], even though the hospital's physical address listed is [address]    If scheduled at [facility] or [facility], please arrive 15 mins early for check-in and test prep.  There is spacious parking and easy access to the radiology department from the [facility] entrance. Please enter here and check-in with the desk attendant.   Please follow these instructions carefully (unless otherwise directed):  An IV will be required for this test and Nitroglycerin will be given.  Hold all erectile dysfunction medications at least 3 days (72 hrs) prior to test. (Ie viagra, cialis, sildenafil, tadalafil, etc)   On the Night Before the Test: Be sure to Drink plenty of water. Do not consume any caffeinated/decaffeinated beverages or chocolate 12 hours prior to your test. Do not take any antihistamines 12 hours prior to your test.   On the Day of the Test: Drink plenty of water until 1 hour prior to the test. Do not eat any food 1 hour prior to test. You may take your regular medications prior to the test.  Take metoprolol 50 mg two hours prior to test. If you take Furosemide/Hydrochlorothiazide/Spironolactone, please HOLD on the morning of the test. FEMALES- please wear underwire-free bra if available, avoid dresses & tight clothing        After the Test: Drink plenty  of water. After receiving IV contrast, you may experience a mild flushed feeling. This is normal. On occasion, you may experience a mild rash up to 24 hours after the test. This is not dangerous. If this occurs, you can take Benadryl 25 mg and increase your fluid intake. If you experience trouble breathing, this can be serious. If it is severe  call 911 IMMEDIATELY. If it is mild, please call our office.  We will call to schedule your test 2-4 weeks out understanding that some insurance companies will need an authorization prior to the service being performed.   For more information and frequently asked questions, please visit our website : [email or web address]  For non-scheduling related questions, please contact the cardiac imaging nurse navigator should you have any questions/concerns: Cardiac Imaging Nurse Navigators Direct Office Dial: [phone]   For scheduling needs, including cancellations and rescheduling, please call [place], [phone].

## 2023-06-19 NOTE — Progress Notes (Signed)
Cardiology Office Note:   Date:  06/19/2023  ID:  Nicole [name], DOB [birth year], MRN [id number] PCP: Miguel Aschoff, MD  [facility] Providers Cardiologist:  Rollene Rotunda, MD {  History of Present Illness:   Nicole [name] is a 62 y.o. female who presents for evaluation of chest pain.  I saw her in 2018 for evaluation of chest pain.  She had a negative POET (Plain Old Exercise Treadmill).    She had an echocardiogram 2009 which was essentially normal.    She was having palpitations and she was to wear a monitor but she did not wear this.  I saw her last in 2023 and she had chest pain.  She had negative POET (Plain Old Exercise Treadmill).  She was at the beach not long [place] when she had some pain in her foot.  She thought she might have a blood clot.  She went to the emergency room about 4 days ago at [facility] .  She was not thought to have anything that needed further studies and was sent home.  However, she was again in the emergency room 2 days ago for chest pain.  I reviewed these records for this visit.  There was no objective evidence of ischemia.  She had a CT to rule out PE and this was negative.  However, there was coronary calcium noted.  This had been noted previously and I had suggested a coronary CTA but this did not happen.  She had previously refused PET scanning.  I did set her up for the CT scan and she eventually did not want to have that.  She says that she still getting this discomfort.  Seems to be fleeting.  She has a hard time quantifying her qualifying it.  She has not been exercising as much.  She wants to get back in the gym.  She is not having any new shortness of breath, PND or orthopnea.  ROS: As stated in the HPI and negative for all other systems.  Studies Reviewed:    EKG:   Sinus rhythm, rate 66, axis within normal limits, intervals within normal limits, no acute ST-T wave changes.  Risk Assessment/Calculations:       Physical Exam:   VS:  BP  108/62   Pulse 83   Ht 5' 5.5" (1.664 m)   Wt 169 lb 3.2 oz (76.7 kg)   LMP 03/03/2016 (Approximate)   SpO2 95%   BMI 27.73 kg/m    Wt Readings from Last 3 Encounters:  06/19/23 169 lb 3.2 oz (76.7 kg)  06/16/23 171 lb 15.3 oz (78 kg)  05/02/23 172 lb (78 kg)     GEN: Well nourished, well developed in no acute distress NECK: No JVD; No carotid bruits CARDIAC: RRR, no murmurs, rubs, gallops RESPIRATORY:  Clear to auscultation without rales, wheezing or rhonchi  ABDOMEN: Soft, non-tender, non-distended EXTREMITIES:  No edema; No deformity   ASSESSMENT AND PLAN:   Precordial chest pain:  Her chest pain has anginal nonanginal features.  She does have known coronary disease.  I think the pretest probability of obstructive coronary disease is moderate.  She had previously refused a coronary CT but she says she would consent to this now.  We will arrange this.  Dyslipidemia: She has not wanted to take a statin.  Unfortunately her LDL was 163.  She says she has changed her diet wants to have her lipids repeated.  We will order a fasting lipid  profile.     Follow up with APP in 10 months.   Signed, Rollene Rotunda, MD

## 2023-06-20 ENCOUNTER — Other Ambulatory Visit: Payer: Self-pay | Admitting: Cardiology

## 2023-06-25 ENCOUNTER — Ambulatory Visit: Payer: 59 | Admitting: Internal Medicine

## 2023-06-25 ENCOUNTER — Ambulatory Visit: Payer: 59 | Admitting: Physical Therapy

## 2023-06-25 NOTE — Progress Notes (Deleted)
Office Visit Note  Patient: Nicole [name]             Date of Birth: [birth year]           MRN: [id number]             PCP: Miguel Aschoff, MD Referring: Miguel Aschoff, MD Visit Date: 06/25/2023   Subjective:  No chief complaint on file.   History of Present Illness: Nicole [name] is a 62 y.o. female here for follow up ***   Previous HPI 11/29/22 Nicole [name] is a 62 y.o. female here for follow up with ongoing joint pain particularly bothering her at the shoulders.  Since her last visit right shoulder pain is doing better but her left shoulder is currently causing more problems.  Gets pain with lifting and pulling activities worst across the top of the shoulder area.  She did not end up using the topical Voltaren at all because she was not very confident about the correct way to do so and prefers avoiding medications is much as possible.  Otherwise she is not experiencing any radiation of symptoms no increased joint pain or swelling elsewhere.  She was scheduled for bone density testing in August of this year.     Previous HPI 10/17/22 [name] is a 62 y.o. female here for follow up for the previous findings of morphea and also with joint pain in several areas most very currently in the right shoulder.  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  She has been recommended to use compression socks to minimize venous stasis but not on systemic medical therapy.  Otherwise has been doing pretty well overall though she has noticed some overall worsening with her energy and health since decreased physical activity.  She has been working on increasing this again but has right shoulder pain this initially started while walking.  Now it is becoming more painful when she tries to reach across the body and it somewhat alleviated with  reaching overhead.  Symptoms are not radiating anywhere she has no known history of serious injury to the shoulder. She is also concerned about bone density and interested in screening for osteoporosis.   Previous HPI 01/01/22 Nicole [name] is a 62 y.o. female here for morphea evaluation for possible systemic sclerosis. She has been diagnosed by dermatology biopsy of affected area on her leg. She has skin rashes in multiple areas she estimates for about 20 years in total not sure about when these have progressed to become more noticeable.  Is associated with loss of hair as well worst in bilateral temporal areas and in her distal legs.  She has a few hyperpigmented skin patches and some areas of thickening or indentations.  Some hypopigmented areas as well but smaller spots and this is also been a scattered distribution including her torso.  She has noticed tightness or catching and tendons on the top of her feet that comes and goes feels like this takes several minutes until release and get back to normal range of movement.  She notices some swelling intermittently in her hands worse when walking and standing for prolonged time.  She saw pulmonology for assessment with no significant CT abnormalities. GI workup is planned for evaluating esophageal dysmotility. Concern is also for possible esophageal stricture or scarring related to chronic GERD.  No Rheumatology ROS completed.   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 01/18/2022   Sensation of food items getting stuck in upper esophagus to be evaluated first by esophagram and then by EGD with Dr. Leone Payor over the next few weeks.   Hypercholesterolemia    Morphea 2022   Nodule of skin of breast 01/04/2021   Obesity    OSA (obstructive sleep apnea) 12/27/2015   Palpitations    negative cardiac w/u per Dr. Daleen Squibb 2/09   Reflux esophagitis    Sleep apnea    No CPAP   Vaginal discharge 03/02/2020    Family History  Problem Relation Age of Onset   Hypertension Mother    Diabetes Mother    Heart disease Mother    Thyroid disease Mother    Glaucoma Mother    Diabetes Father    Hypertension Sister    Diabetes Sister    Diabetes Sister    Hypertension Sister    Sleep apnea Sister    Hypertension Sister    Diabetes Sister    Hypertension Brother    Diabetes Brother    Pancreatic cancer Maternal Uncle    Heart attack Maternal Grandmother    Heart attack Maternal Grandfather    Mental illness  Son    Mental illness Son    Pancreatic cancer Other        family history   Colon cancer Neg Hx    Past Surgical History:  Procedure Laterality Date   COLONOSCOPY     ECTOPIC PREGNANCY SURGERY     EXPLORATORY LAPAROTOMY  2000   x2 (Dr. Dierdre Forth)   HERPES SIMPLEX VIRUS DFA     TUBAL LIGATION  1987   Social History   Social History Narrative   Current Social History 02/16/2021        Patient lives alone in a [place] which is 2 stories. There are 13 steps with handrails up to the entrance the patient uses.       Patient's method of transportation is city bus.  The highest level of education was high school diploma.      The patient currently disabled.      Identified important Relationships are "My mother, my sisters, children, grandchildren (5)"       Pets : None       Interests / Fun: "Read, watch TV, movies, social media, talk on phone, shopping, travel"       Current Stressors: "Simple people get on my nerves,;drama."       Religious / Personal Beliefs: "Christian, [place], Pentecostal."       Other: "I stay away from people."      L. Ducatte, BSN, RN-BC              Immunization History  Administered Date(s) Administered   Td 10/18/2005   Tdap 10/27/2012     Objective: Vital Signs: LMP 03/03/2016 (Approximate)    Physical Exam   Musculoskeletal Exam: ***  CDAI Exam: CDAI Score: -- Patient Global: --; Provider Global: -- Swollen: --; Tender: -- Joint Exam 06/25/2023   No joint exam has been documented for this visit   There is currently no information documented on the homunculus. Go to the Rheumatology activity and complete the homunculus joint exam.  Investigation: No additional findings.  Imaging: VAS [place] LOWER EXTREMITY VENOUS (DVT) (ONLY [facility] & [facility])  Result Date: 06/18/2023  Lower Venous DVT Study Patient Name:  MONTOYA WATKIN Bong  Date of Exam:   06/18/2023 Medical Rec #: [id number]    Accession #:    [id number] Date of Birth:  10-Aug-1961   Patient Gender: F Patient Age:   77 years Exam Location:  [facility] Procedure:      VAS [place] LOWER EXTREMITY VENOUS (DVT) Referring Phys: MATTHEW TRIFAN --------------------------------------------------------------------------------  Indications: Pain.  Risk Factors: Past pregnancy. Comparison Study: No prior study Performing Technologist: Shona Simpson  Examination Guidelines: A complete evaluation includes B-mode imaging, spectral Doppler, color Doppler, and power Doppler as needed of all accessible portions of each vessel. Bilateral testing is considered an integral part of a complete examination. Limited examinations for reoccurring indications may be performed as noted. The reflux portion of the exam is performed with the patient in reverse Trendelenburg.  +---------+---------------+---------+-----------+----------+--------------+ RIGHT    CompressibilityPhasicitySpontaneityPropertiesThrombus Aging +---------+---------------+---------+-----------+----------+--------------+ CFV      Full           Yes      Yes                                 +---------+---------------+---------+-----------+----------+--------------+ SFJ      Full                                                        +---------+---------------+---------+-----------+----------+--------------+ FV Prox  Full                                                        +---------+---------------+---------+-----------+----------+--------------+ FV Mid   Full                                                        +---------+---------------+---------+-----------+----------+--------------+  FV DistalFull                                                        +---------+---------------+---------+-----------+----------+--------------+ PFV      Full                                                        +---------+---------------+---------+-----------+----------+--------------+ POP      Full            Yes      Yes                                 +---------+---------------+---------+-----------+----------+--------------+ PTV      Full                                                        +---------+---------------+---------+-----------+----------+--------------+ PERO     Full                                                        +---------+---------------+---------+-----------+----------+--------------+   +----+---------------+---------+-----------+----------+--------------+ LEFTCompressibilityPhasicitySpontaneityPropertiesThrombus Aging +----+---------------+---------+-----------+----------+--------------+ CFV Full           Yes      Yes                                 +----+---------------+---------+-----------+----------+--------------+     Summary: RIGHT: - There is no evidence of deep vein thrombosis in the lower extremity.  - No cystic structure found in the popliteal fossa.  LEFT: - No evidence of common femoral vein obstruction.   *See table(s) above for measurements and observations. Electronically signed by Carolynn Sayers on 06/18/2023 at 12:33:52 PM.    Final    CT Angio Chest PE W and/or Wo Contrast  Result Date: 06/18/2023 CLINICAL DATA:  62 year old female with history of syncope. EXAM: CT ANGIOGRAPHY CHEST WITH CONTRAST TECHNIQUE: Multidetector CT imaging of the chest was performed using the standard protocol during bolus administration of intravenous contrast. Multiplanar CT image reconstructions and MIPs were obtained to evaluate the vascular anatomy. RADIATION DOSE REDUCTION: This exam was performed according to the departmental dose-optimization program which includes automated exposure control, adjustment of the mA and/or kV according to patient size and/or use of iterative reconstruction technique. CONTRAST:  75mL OMNIPAQUE IOHEXOL 350 MG/ML SOLN COMPARISON:  No priors. FINDINGS: Cardiovascular: No filling defects are noted in the pulmonary  arterial tree to suggest pulmonary embolism. Heart size is normal. There is no significant pericardial fluid, thickening or pericardial calcification. Atherosclerotic calcifications are noted in the left anterior descending coronary artery. Mediastinum/Nodes: No pathologically enlarged mediastinal or hilar lymph nodes. Esophagus is unremarkable in appearance. No axillary lymphadenopathy. Lungs/Pleura: No suspicious appearing pulmonary nodules or masses are  noted. No acute consolidative airspace disease. No pleural effusions. Upper Abdomen: Unremarkable. Musculoskeletal: There are no aggressive appearing lytic or blastic lesions noted in the visualized portions of the skeleton. Review of the MIP images confirms the above findings. IMPRESSION: 1. No acute findings are noted in the thorax to account for the patient's symptoms. Specifically, no evidence of pulmonary embolism. 2. Atherosclerotic calcifications in the left anterior descending coronary artery. Please note that although the presence of coronary artery calcium documents the presence of coronary artery disease, the severity of this disease and any potential stenosis cannot be assessed on this non-gated CT examination. Assessment for potential risk factor modification, dietary therapy or pharmacologic therapy may be warranted, if clinically indicated. Electronically Signed   By: Trudie Reed M.D.   On: 06/18/2023 06:48   DG Chest 2 View  Result Date: 06/17/2023 CLINICAL DATA:  Worsening chest pain EXAM: CHEST - 2 VIEW COMPARISON:  06/16/2023 FINDINGS: The heart size and mediastinal contours are within normal limits. Both lungs are clear. The visualized skeletal structures are unremarkable. IMPRESSION: No active cardiopulmonary disease. Electronically Signed   By: Jasmine Pang M.D.   On: 06/17/2023 21:59   VAS [place] UPPER EXTREMITY VENOUS DUPLEX  Result Date: 06/16/2023 UPPER VENOUS STUDY  Patient Name:  BROOKLYNN BRANDENBURG Fata  Date of Exam:   06/16/2023  Medical Rec #: [id number]    Accession #:    [id number] Date of Birth: [birth year]   Patient Gender: F Patient Age:   73 years Exam Location:  [facility] Procedure:      VAS [place] UPPER EXTREMITY VENOUS DUPLEX Referring Phys: [facility] SPONSELLER --------------------------------------------------------------------------------  Indications: Patient presents with pain in lateral upper arm for 2 days Comparison Study: No priors. Performing Technologist: Marilynne Halsted RDMS, RVT  Examination Guidelines: A complete evaluation includes B-mode imaging, spectral Doppler, color Doppler, and power Doppler as needed of all accessible portions of each vessel. Bilateral testing is considered an integral part of a complete examination. Limited examinations for reoccurring indications may be performed as noted.  Right Findings: +----------+------------+---------+-----------+----------+-------+ RIGHT     CompressiblePhasicitySpontaneousPropertiesSummary +----------+------------+---------+-----------+----------+-------+ IJV           Full       Yes       Yes                      +----------+------------+---------+-----------+----------+-------+ Subclavian    Full       Yes       Yes                      +----------+------------+---------+-----------+----------+-------+ Axillary      Full       Yes       Yes                      +----------+------------+---------+-----------+----------+-------+ Brachial      Full                                          +----------+------------+---------+-----------+----------+-------+ Radial        Full                                          +----------+------------+---------+-----------+----------+-------+ Ulnar  Full                                          +----------+------------+---------+-----------+----------+-------+ Cephalic      Full                                           +----------+------------+---------+-----------+----------+-------+ Basilic       Full                                          +----------+------------+---------+-----------+----------+-------+  Left Findings: +----------+------------+---------+-----------+----------+-------+ LEFT      CompressiblePhasicitySpontaneousPropertiesSummary +----------+------------+---------+-----------+----------+-------+ Subclavian               Yes       Yes                      +----------+------------+---------+-----------+----------+-------+  Summary:  Right: No evidence of deep vein thrombosis in the upper extremity. No evidence of superficial vein thrombosis in the upper extremity.  Left: No evidence of thrombosis in the subclavian.  *See table(s) above for measurements and observations.  Diagnosing physician: Gerarda Fraction Electronically signed by Gerarda Fraction on 06/16/2023 at 9:40:58 AM.    Final    DG Chest 2 View  Result Date: 06/16/2023 CLINICAL DATA:  62 year old female with history of chest pain. EXAM: CHEST - 2 VIEW COMPARISON:  Chest x-ray 05/21/2022. FINDINGS: Lung volumes are normal. No consolidative airspace disease. No pleural effusions. No pneumothorax. No pulmonary nodule or mass noted. Pulmonary vasculature and the cardiomediastinal silhouette are within normal limits. IMPRESSION: No radiographic evidence of acute cardiopulmonary disease. Electronically Signed   By: Trudie Reed M.D.   On: 06/16/2023 05:45    Recent Labs: Lab Results  Component Value Date   WBC 4.5 06/17/2023   HGB 12.2 06/17/2023   PLT 222 06/17/2023   NA 140 06/17/2023   K 3.6 06/17/2023   CL 106 06/17/2023   CO2 25 06/17/2023   GLUCOSE 122 (H) 06/17/2023   BUN 14 06/17/2023   CREATININE 0.86 06/17/2023   BILITOT 0.6 05/02/2023   ALKPHOS 57 05/02/2023   AST 20 05/02/2023   ALT 30 05/02/2023   PROT 6.8 05/02/2023   ALBUMIN 4.1 05/02/2023   CALCIUM 9.7 06/17/2023   GFRAA 85 09/25/2019   QFTBGOLD  Negative 03/14/2017    Speciality Comments: No specialty comments available.  Procedures:  No procedures performed Allergies: Other   Assessment / Plan:     Visit Diagnoses: No diagnosis found.  ***  Orders: No orders of the defined types were placed in this encounter.  No orders of the defined types were placed in this encounter.    Follow-Up Instructions: No follow-ups on file.   Fuller Plan, MD  Note - This record has been created using [employer].  Chart creation errors have been sought, but may not always  have been located. Such creation errors do not reflect on  the standard of medical care.

## 2023-06-27 ENCOUNTER — Encounter: Payer: Self-pay | Admitting: Physical Therapy

## 2023-06-27 ENCOUNTER — Encounter: Payer: 59 | Attending: Obstetrics and Gynecology | Admitting: Physical Therapy

## 2023-06-27 DIAGNOSIS — R252 Cramp and spasm: Secondary | ICD-10-CM | POA: Diagnosis not present

## 2023-06-27 DIAGNOSIS — R102 Pelvic and perineal pain: Secondary | ICD-10-CM | POA: Insufficient documentation

## 2023-06-27 NOTE — Therapy (Signed)
OUTPATIENT PHYSICAL THERAPY FEMALE PELVIC TREATMENT   Patient Name: Nicole [name] MRN: [id number] DOB:26-Oct-1960, 62 y.o., female Today's Date: 06/27/2023  END OF SESSION:  PT End of Session - 06/27/23 1128     Visit Number 3    Date for PT Re-Evaluation 08/13/23    Authorization Type [employer] medicare/ medicaid    Authorization - Visit Number 3    Authorization - Number of Visits 10    PT Start Time 1125    PT Stop Time 1215    PT Time Calculation (min) 50 min    Activity Tolerance Patient tolerated treatment well    Behavior During Therapy WFL for tasks assessed/performed             Past Medical History:  Diagnosis Date   Anemia    Anxiety    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 01/18/2022   Sensation of food items getting stuck in upper esophagus to be evaluated first by esophagram and then by EGD with Dr. Leone Payor over the next few weeks.   Hypercholesterolemia    Morphea 2022   Nodule of skin of breast 01/04/2021   Obesity    OSA (obstructive sleep apnea) 12/27/2015   Palpitations    negative cardiac w/u per Dr. Daleen Squibb 2/09   Reflux esophagitis    Sleep apnea    No CPAP   Vaginal discharge 03/02/2020   Past Surgical History:  Procedure Laterality Date   COLONOSCOPY     ECTOPIC PREGNANCY SURGERY     EXPLORATORY LAPAROTOMY  2000   x2 (Dr. 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 07/03/2021   Other personal history of psychological trauma, not elsewhere classified 03/16/2021   Throat irritation 12/16/2019   Hyperlipidemia 11/10/2019   History of recurrent UTI (urinary tract infection) 11/12/2017   Marijuana use 11/12/2017   Hoarseness of voice 10/25/2016   Pain in left shoulder 07/20/2016   Hematuria of unknown etiology 06/01/2013   Tinea pedis 04/14/2007    PCP: Miguel Aschoff, MD  REFERRING PROVIDER: Lorriane Shire, MD  REFERRING DIAG: M79.10 (ICD-10-CM) - Myalgia   THERAPY DIAG:  Cramp and spasm - Plan: PT plan of care cert/re-cert  Pelvic pain - Plan: PT plan of care cert/re-cert  Rationale for Evaluation and Treatment: Rehabilitation  ONSET DATE: 2022  SUBJECTIVE:  SUBJECTIVE STATEMENT: I am still having trouble to push the stool out and needs to splint.  I was not able to come to last visit due to having a stomach virus. I am going to the gym now.    PAIN:  Are you having pain? Yes NPRS scale: 8/10 Pain location: Vaginal and suprapubic bone  Pain type: dull and sharp Pain description: intermittent   Aggravating factors: vaginal penetration, sitting Relieving factors: no vaginal penetration, bowel movements  PRECAUTIONS: None  RED FLAGS: None   WEIGHT BEARING RESTRICTIONS: No  FALLS:  Has patient fallen in last 6  months? No  LIVING ENVIRONMENT: Lives with: lives alone   OCCUPATION: not sure  PLOF: Independent  PATIENT GOALS: reduce pain and make it easier for bowel movements  PERTINENT HISTORY:  See above   BOWEL MOVEMENT: Pain with bowel movement: Yes Type of bowel movement:Type (Bristol Stool Scale) Type 1, Frequency 1-2 times per day, Strain Yes, and Splinting yes, vaginally Fully empty rectum: No Leakage: No Fiber supplement: Yes: Murelax  URINATION: Pain with urination: No Fully empty bladder: Yes: sometime has to go back 2 minutes later to urinate again Stream: Strong Urgency: No Frequency: 3-5 hours Leakage:  none Pads: No  INTERCOURSE: Pain with intercourse: Initial Penetration, During Penetration, After Intercourse, and Pain Interrupts Intercourse Ability to have vaginal penetration:  Yes: will get tearing Climax: yes Marinoff Scale: 2-3/3  PREGNANCY: Vaginal deliveries 3 Tearing Yes: grade 4 and episiotomy  OBJECTIVE:   DIAGNOSTIC FINDINGS:  none  PATIENT SURVEYS:  POPIQ-7 29 CRAIQ-7 24 PFIQ-7 52 06/27/23 POPIQ-7 29; CRAIQ-7 24; PFIQ-7 52   COGNITION: Overall cognitive status: Within functional limits for tasks assessed     SENSATION: Light touch: Appears intact Proprioception: Appears intact    POSTURE: No Significant postural limitations  PELVIC ALIGNMENT: ASIS are equal   LOWER EXTREMITY ROM: Bilateral hip ROM is full   LOWER EXTREMITY MMT:  MMT Right eval Left eval Left  06/27/23  Hip extension 5/5 4/5 4/5  Hip abduction 5/5 4/5 4/5  Hip adduction 5/5 4/5 5/5   PALPATION:   General  abdominal area is tender; tenderness supra pubically; scar suprapubic area is limited;                 External Perineal Exam tenderness located on the perineal  body, along the ischiocavernosus, bulbocavernosus, levator ani and obturator internist                             Internal Pelvic Floor not assessed at this time  Patient confirms  identification and approves PT to assess internal pelvic floor and treatment No 06/27/23: patient has informed therapist she will not be comfortable with the internal assessment of the pelvic floor and will not be part of the treartment.          TONE: increased  TODAY'S TREATMENT:       06/27/23 Manual: Soft tissue mobilization: Circular massage to the abdomen to promote peristalic motion of the intestines Scar tissue mobilization: Scar massage to lower abdomen to improve tissue mobility Myofascial release: Fascial release around the lower abdomen and upper to reduce fascial restrictions to improve bowel movements.  Neuromuscular re-education: Down training: Diaphragmatic breathing to relax the pelvic floor and work on toileting Exercises: Stretches/mobility: Sitting piriformis stretch holding 30 sec bil.  Hamstring stretch in sitting holding 30 sec bil.  Therapeutic activities: Functional strengthening activities: Siting with diaphragmatic breathing,  knees above hips, breathing into fist to generate pressure to push stool out      05/07/23 Manual: Soft tissue mobilization: Circular massage to the abdomen to promote peristalic motion of the intestines Educated patient on how to perform abdominal massage Scar tissue mobilization: Scar massage to lower abdomen to improve tissue mobility Myofascial release: Fascial release around the lower abdomen and upper to reduce fascial restrictions to improve bowel movements.  Exercises: Stretches/mobility: Piriformis stretch sitting holding 30 sec bil.  Sitting hamstring stretch holding for 30 sec bil.  Supine trunk rotation holding 30 sec bil.                                                                                                                          DATE: 04/23/23  EVAL see below  PATIENT EDUCATION: 06/27/23 Education details: Access Code: [email or web address], ways to sit on commode to have a bowel movement Person educated:  Patient Education method: Explanation, Demonstration, Tactile cues, Verbal cues, and Handouts Education comprehension: verbalized understanding, returned demonstration, verbal cues required, tactile cues required, and needs further education   HOME EXERCISE PROGRAM: 06/27/23 Access Code: [email or web address] URL: [email or web address] Date: 06/27/2023 Prepared by: [name]  Exercises - Seated Piriformis Stretch with Trunk Bend  - 1 x daily - 7 x weekly - 1 sets - 2 reps - 30 sec hold - Seated Hamstring Stretch  - 1 x daily - 7 x weekly - 1 sets - 2 reps - 30 sec hold - Supine Piriformis Stretch with Leg Straight  - 1 x daily - 7 x weekly - 1 sets - 2 reps - 30 sec hold - Happy Baby with Pelvic Floor Lengthening  - 1 x daily - 7 x weekly - 1 sets - 1 reps - 30 sec hold - Supine Abdominal Wall Massage  - 1 x daily - 7 x weekly - 3 sets - 10 reps - Seated Pelvic Floor Contraction  - 3 x daily - 7 x weekly - 1 sets - 10 reps - 10 sec hold  ASSESSMENT:  CLINICAL IMPRESSION: Patient is a 62 y.o. female who was seen today for physical therapy  treatment for myalgia. Patient has had difficulty getting into therapy due to therapist schedule is limited.  Patient reports she has been in and out of the hospital and depressed and sick since she has been in therapy.  Patient understands how to sit on the commode to have a bowel movement and relax the pelvic floor. Patient is not comfortable with internal pelvic floor assessment and will not be part of treatment. She has only been to the evaluation and this is her second treatment. She has not progressed yet due to being sick and therapist not having as much availability to come to therapy. Patient will benefit from skilled therapy to reduce her pain and improve ease of bowel movements.   OBJECTIVE IMPAIRMENTS: decreased coordination, decreased endurance, decreased strength, increased fascial restrictions, increased muscle spasms, and pain.  ACTIVITY LIMITATIONS: sitting and toileting  PARTICIPATION LIMITATIONS: meal prep, cleaning, interpersonal relationship, and community activity  PERSONAL FACTORS: Age, Fitness, Time since onset of injury/illness/exacerbation, and 1-2 comorbidities: depression, recurrent UTI  are also affecting patient's functional outcome.   REHAB POTENTIAL: Good  CLINICAL DECISION MAKING: Evolving/moderate complexity  EVALUATION COMPLEXITY: Moderate   GOALS: Goals reviewed with patient? Yes  SHORT TERM GOALS: Target date: 05/20/23  Patient independent with hip stretches to elongate the pelvic floor muscles.  Baseline: Goal status: Met 06/27/23  2.  Patient is independent with abdominal massage to promote peristalic motion of the intestines.  Baseline:  Goal status: Met 06/27/23  3.  Patient is educated on perineal massage to assist with elongation of the tissue to reduce the chance of tearing.  Baseline:  Goal status: ongoing 06/27/23   LONG TERM GOALS: Target date: 08/13/23 Patient is independent with advanced HEP for pelvic floor lengthening.  Baseline:  Goal status: ongoing 06/27/23  2.  Patient is able to have vaginal penetration with pain level >/= 2-3/10 due to the ability to relax the pelvic floor with diaphragmatic breathing.  Baseline:  Goal status: ongoing ll/7/24  3.  Patient reports straining with bowel movements decreased >/= 2/10 due to the ability to relax the pelvic floor while pushing the stool out.  Baseline:  Goal status: ongoing 06/27/23  4.  Patient is able to sit with discomfort </= 3/10 due to reduction of trigger points.  Baseline:  Goal status: ongoing 06/27/23  5.  PFIQ-7 goes from 52 to 20 deu to decreased pain and decreased frustration.  Baseline:  Goal status: ongoing 06/27/23   PLAN:  PT FREQUENCY: 1x/week  PT DURATION: 1 week  PLANNED INTERVENTIONS: Therapeutic exercises, Therapeutic activity, Neuromuscular re-education, Balance training,  Patient/Family education, Joint mobilization, Dry Needling, Electrical stimulation, Cryotherapy, Moist heat, Ultrasound, Biofeedback, and Manual therapy  PLAN FOR NEXT SESSION:  review abdominal massage,  diaphragmatic breathing, see how toileting is going, sit on foam roll to massage the pelvic floor, RUSI on the suprapelvic area   [name], PT 06/27/23 3:26 PM

## 2023-07-03 ENCOUNTER — Encounter: Payer: Self-pay | Admitting: Physical Therapy

## 2023-07-03 ENCOUNTER — Ambulatory Visit: Payer: 59 | Attending: Obstetrics and Gynecology | Admitting: Physical Therapy

## 2023-07-03 DIAGNOSIS — M791 Myalgia, unspecified site: Secondary | ICD-10-CM | POA: Diagnosis not present

## 2023-07-03 DIAGNOSIS — R252 Cramp and spasm: Secondary | ICD-10-CM | POA: Diagnosis not present

## 2023-07-03 DIAGNOSIS — R102 Pelvic and perineal pain: Secondary | ICD-10-CM | POA: Insufficient documentation

## 2023-07-03 NOTE — Patient Instructions (Signed)
Moisturizers They are used in the vagina to hydrate the mucous membrane that make up the vaginal canal. Designed to keep a more normal acid balance (ph) Once placed in the vagina, it will last between two to three days.  Use 2-3 times per week at bedtime  Ingredients to avoid is glycerin and fragrance, can increase chance of infection Should not be used just before sex due to causing irritation Most are gels administered either in a tampon-shaped applicator or as a vaginal suppository. They are non-hormonal.   Types of Moisturizers(internal use)  Vitamin E vaginal suppositories- Whole foods, Amazon Moist Again Julva- (Do no use if taking  Tamoxifen) amazon Yes moisturizer- amazon NeuEve Silk , NeuEve Silver for menopausal or over 65 (if have severe vaginal atrophy or cancer treatments use NeuEve Silk for  1 month than move to [employer])- [employer], [place].[employer] Olive and Bee intimate cream- [email or web address] Mae vaginal moisturizer- Amazon Aloe Good Clean Love Hyaluronic acid Hyalofemme Reveree hyaluronic acid inserts   Creams to use externally on the Vulva area daily at  night Vulva Balm/ V-magic cream by medicine mama- amazon Julva-amazon Vital "V Wild Yam salve ( help moisturize and help with thinning vulvar area, does have Beeswax MoodMaid Botanical Pro-Meno Wild Yam Cream- [employer] Desert Harvest Gele Cleo by Zane Herald labial moisturizer (Amazon),  aloe Good Clean Love Enchanted Rose by intimate rose Honey pot at [employer]  Things to avoid in the vaginal area Do not use things to irritate the vulvar area No lotions just specialized creams for the vulva area- Neogyn, V-magic,  No soaps; can use Aveeno or Calendula cleanser, unscented Dove if needed. Must be gentle No deodorants No douches Good to sleep without underwear to let the vaginal area to air out No scrubbing: spread the lips to let warm water rinse over labias and pat dry   [facility] [address], [address] [place], [place] [address] Phone # [phone] Fax [phone]

## 2023-07-03 NOTE — Therapy (Signed)
OUTPATIENT PHYSICAL THERAPY FEMALE PELVIC TREATMENT   Patient Name: Nicole [name] MRN: [id number] DOB:Aug 01, 1961, 62 y.o., female Today's Date: 07/03/2023  END OF SESSION:  PT End of Session - 07/03/23 0845     Visit Number 4    Date for PT Re-Evaluation 08/13/23    Authorization Type [employer] medicare/ medicaid    Authorization - Visit Number 4    Authorization - Number of Visits 10    PT Start Time 0845    PT Stop Time 0925    PT Time Calculation (min) 40 min    Activity Tolerance Patient tolerated treatment well    Behavior During Therapy WFL for tasks assessed/performed             Past Medical History:  Diagnosis Date   Anemia    Anxiety    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 01/18/2022   Sensation of food items getting stuck in upper esophagus to be evaluated first by esophagram and then by EGD with Dr. Leone Payor over the next few weeks.   Hypercholesterolemia    Morphea 2022   Nodule of skin of breast 01/04/2021   Obesity    OSA (obstructive sleep apnea) 12/27/2015   Palpitations    negative cardiac w/u per Dr. Daleen Squibb 2/09   Reflux esophagitis    Sleep apnea    No CPAP   Vaginal discharge 03/02/2020   Past Surgical History:  Procedure Laterality Date   COLONOSCOPY     ECTOPIC PREGNANCY SURGERY     EXPLORATORY LAPAROTOMY  2000   x2 (Dr. 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 07/03/2021   Other personal history of psychological trauma, not elsewhere classified 03/16/2021   Throat irritation 12/16/2019   Hyperlipidemia 11/10/2019   History of recurrent UTI (urinary tract infection) 11/12/2017   Marijuana use 11/12/2017   Hoarseness of voice 10/25/2016   Pain in left shoulder 07/20/2016   Hematuria of unknown etiology 06/01/2013   Tinea pedis 04/14/2007    PCP: Miguel Aschoff, MD  REFERRING PROVIDER: Lorriane Shire, MD  REFERRING DIAG: M79.10 (ICD-10-CM) - Myalgia   THERAPY DIAG:  Cramp and spasm  Pelvic pain  Rationale for Evaluation and Treatment: Rehabilitation  ONSET DATE: 2022  SUBJECTIVE:  SUBJECTIVE STATEMENT: I have had pain in the left lower quadrant. The vaginal pain comes less often.    PAIN:  Are you having pain? Yes NPRS scale: 8/10 Pain location: Vaginal and suprapubic bone  Pain type: dull and sharp Pain description: intermittent   Aggravating factors: vaginal penetration, sitting Relieving factors: no vaginal penetration, bowel movements  PRECAUTIONS: None  RED FLAGS: None   WEIGHT BEARING RESTRICTIONS: No  FALLS:  Has patient fallen in last 6 months? No  LIVING ENVIRONMENT: Lives with: lives alone   OCCUPATION: not sure  PLOF: Independent  PATIENT GOALS: reduce pain and make it easier for bowel  movements  PERTINENT HISTORY:  See above   BOWEL MOVEMENT: Pain with bowel movement: Yes Type of bowel movement:Type (Bristol Stool Scale) Type 1, Frequency 1-2 times per day, Strain Yes, and Splinting yes, vaginally Fully empty rectum: No Leakage: No Fiber supplement: Yes: Murelax  URINATION: Pain with urination: No Fully empty bladder: Yes: sometime has to go back 2 minutes later to urinate again Stream: Strong Urgency: No Frequency: 3-5 hours Leakage:  none Pads: No  INTERCOURSE: Pain with intercourse: Initial Penetration, During Penetration, After Intercourse, and Pain Interrupts Intercourse Ability to have vaginal penetration:  Yes: will get tearing Climax: yes Marinoff Scale: 2-3/3  PREGNANCY: Vaginal deliveries 3 Tearing Yes: grade 4 and episiotomy  OBJECTIVE:   DIAGNOSTIC FINDINGS:  none  PATIENT SURVEYS:  POPIQ-7 29 CRAIQ-7 24 PFIQ-7 52 06/27/23 POPIQ-7 29; CRAIQ-7 24; PFIQ-7 52   COGNITION: Overall cognitive status: Within functional limits for tasks assessed     SENSATION: Light touch: Appears intact Proprioception: Appears intact    POSTURE: No Significant postural limitations  PELVIC ALIGNMENT: ASIS are equal   LOWER EXTREMITY ROM: Bilateral hip ROM is full   LOWER EXTREMITY MMT:  MMT Right eval Left eval Left  06/27/23  Hip extension 5/5 4/5 4/5  Hip abduction 5/5 4/5 4/5  Hip adduction 5/5 4/5 5/5   PALPATION:   General  abdominal area is tender; tenderness supra pubically; scar suprapubic area is limited;                 External Perineal Exam tenderness located on the perineal  body, along the ischiocavernosus, bulbocavernosus, levator ani and obturator internist                             Internal Pelvic Floor not assessed at this time  Patient confirms identification and approves PT to assess internal pelvic floor and treatment No 06/27/23: patient has informed therapist she will not be comfortable with the internal  assessment of the pelvic floor and will not be part of the treartment.          TONE: increased  TODAY'S TREATMENT:     07/03/23 Neuromuscular re-education: Pelvic floor contraction training: Using the RUSI using the pelvic floor setting supapubic area working on contracting the pelvic floor and bulging without using the gluteals Down training: Exercises: Stretches/mobility: Educated patient on using vaginal moisturizers and how it improves the health and elasticity of the tissue to reduce the tearing of the vaginal tissue Educated patient to massage the vulva tissue to improve the blood flow  Strengthening: Sitting pelvic floor contraction without using the buttocks holding 10 sec 10 x      06/27/23 Manual: Soft tissue mobilization: Circular massage to the abdomen to promote peristalic motion of the intestines Scar tissue mobilization: Scar massage to lower abdomen  to improve tissue mobility Myofascial release: Fascial release around the lower abdomen and upper to reduce fascial restrictions to improve bowel movements.  Neuromuscular re-education: Down training: Diaphragmatic breathing to relax the pelvic floor and work on toileting Exercises: Stretches/mobility: Sitting piriformis stretch holding 30 sec bil.  Hamstring stretch in sitting holding 30 sec bil.  Therapeutic activities: Functional strengthening activities: Siting with diaphragmatic breathing, knees above hips, breathing into fist to generate pressure to push stool out      05/07/23 Manual: Soft tissue mobilization: Circular massage to the abdomen to promote peristalic motion of the intestines Educated patient on how to perform abdominal massage Scar tissue mobilization: Scar massage to lower abdomen to improve tissue mobility Myofascial release: Fascial release around the lower abdomen and upper to reduce fascial restrictions to improve bowel movements.  Exercises: Stretches/mobility: Piriformis stretch  sitting holding 30 sec bil.  Sitting hamstring stretch holding for 30 sec bil.  Supine trunk rotation holding 30 sec bil.     PATIENT EDUCATION: 07/03/23 Education details: Access Code: [email or web address], ways to sit on commode to have a bowel movement Person educated: Patient Education method: Explanation, Demonstration, Tactile cues, Verbal cues, and Handouts Education comprehension: verbalized understanding, returned demonstration, verbal cues required, tactile cues required, and needs further education   HOME EXERCISE PROGRAM: 07/03/23 Access Code: [email or web address] URL: [email or web address] Date: 06/27/2023 Prepared by: [name]  Exercises - Seated Piriformis Stretch with Trunk Bend  - 1 x daily - 7 x weekly - 1 sets - 2 reps - 30 sec hold - Seated Hamstring Stretch  - 1 x daily - 7 x weekly - 1 sets - 2 reps - 30 sec hold - Supine Piriformis Stretch with Leg Straight  - 1 x daily - 7 x weekly - 1 sets - 2 reps - 30 sec hold - Happy Baby with Pelvic Floor Lengthening  - 1 x daily - 7 x weekly - 1 sets - 1 reps - 30 sec hold - Supine Abdominal Wall Massage  - 1 x daily - 7 x weekly - 3 sets - 10 reps - Seated Pelvic Floor Contraction  - 3 x daily - 7 x weekly - 1 sets - 10 reps - 10 sec hold  ASSESSMENT:  CLINICAL IMPRESSION: Patient is a 62 y.o. female who was seen today for physical therapy  treatment for myalgia. Patient is now getting her vaginal pain 3 times per month instead of 3 times per week. Patient does not strain when her stool soft and she eats almonds. Patient still tears with penile penetration vaginally.  Patient has learned how to use vaginal moisturizers and massage the perineal area to reduce the tearing of the area. Patient understands how to contract the pelvic floor without contracting the buttocks. She understands how to bulge the pelvic floor. Patient will benefit from skilled therapy to reduce her pain and improve ease of bowel movements.   OBJECTIVE  IMPAIRMENTS: decreased coordination, decreased endurance, decreased strength, increased fascial restrictions, increased muscle spasms, and pain.   ACTIVITY LIMITATIONS: sitting and toileting  PARTICIPATION LIMITATIONS: meal prep, cleaning, interpersonal relationship, and community activity  PERSONAL FACTORS: Age, Fitness, Time since onset of injury/illness/exacerbation, and 1-2 comorbidities: depression, recurrent UTI  are also affecting patient's functional outcome.   REHAB POTENTIAL: Good  CLINICAL DECISION MAKING: Evolving/moderate complexity  EVALUATION COMPLEXITY: Moderate   GOALS: Goals reviewed with patient? Yes  SHORT TERM GOALS: Target date: 05/20/23  Patient independent with hip stretches to elongate the pelvic floor muscles.  Baseline: Goal status: Met 06/27/23  2.  Patient is independent with abdominal massage to promote peristalic motion of the intestines.  Baseline:  Goal status: Met 06/27/23  3.  Patient is educated on perineal massage to assist with elongation of the tissue to reduce the chance of tearing.  Baseline:  Goal status: ongoing 06/27/23   LONG TERM GOALS: Target date: 08/13/23 Patient is independent with advanced HEP for pelvic floor lengthening.  Baseline:  Goal status: ongoing 06/27/23  2.  Patient is able to have vaginal penetration with pain level >/= 2-3/10 due to the ability to relax the pelvic floor with diaphragmatic breathing.  Baseline:  Goal status: ongoing ll/7/24  3.  Patient reports straining with bowel movements decreased >/= 2/10 due to the ability to relax the pelvic floor while pushing the stool out.  Baseline:  Goal status: ongoing 06/27/23  4.  Patient is able to sit with discomfort </= 3/10 due to reduction of trigger points.  Baseline:  Goal status: ongoing 06/27/23  5.  PFIQ-7 goes from 52 to 20 deu to decreased pain and decreased frustration.  Baseline:  Goal status: ongoing 06/27/23   PLAN:  PT FREQUENCY:  1x/week  PT DURATION: 1 week  PLANNED INTERVENTIONS: Therapeutic exercises, Therapeutic activity, Neuromuscular re-education, Balance training, Patient/Family education, Joint mobilization, Dry Needling, Electrical stimulation, Cryotherapy, Moist heat, Ultrasound, Biofeedback, and Manual therapy  PLAN FOR NEXT SESSION:  review abdominal massage,  diaphragmatic breathing, s, sit on foam roll to massage the pelvic floor, update HEP, next visit is 08/27/23 but trying get her in sooner.   [name], PT 07/03/23 9:30 AM

## 2023-07-09 ENCOUNTER — Encounter: Payer: Self-pay | Admitting: Physical Therapy

## 2023-07-09 ENCOUNTER — Encounter: Payer: 59 | Admitting: Physical Therapy

## 2023-07-09 DIAGNOSIS — R252 Cramp and spasm: Secondary | ICD-10-CM

## 2023-07-09 DIAGNOSIS — R102 Pelvic and perineal pain: Secondary | ICD-10-CM

## 2023-07-09 NOTE — Patient Instructions (Addendum)
How To Poop Better:  What are Good Poops? There is no one exact normal, but they should be REGULAR.  This varies from person to person and ranges from up to 3x/day or as little as 3-4/week.  This should stay consistent for you.  They should be formed and ideally one solid mass that doesn't fall apart or dissolve in the water and is brown in color.  Lifestyle Tips:  Fiber: Eat 25-31 grams per day Do not hold it.  If you need to go, GO! Try to go every day around the same time Walk and move more Probiotics for more healthy gut bacteria Water and fluids: half of your healthy body weight in ounces  Toileting Tips:  Posture: knees above hips, back flat, look straight ahead, RELAX Relax all the muscles from your face down to your toes Breathe: slow deep breaths into your belly and pelvic floor is RELAXED Blow: Tighten belly and blow like blowing up a balloon, make "SH" sound, make a vowel sound with a deep voice Do NOT sit more than 10 minutes After you are finished, tighten the muscles to reset pelvic floor back to normal    Moisturizers They are used in the vagina to hydrate the mucous membrane that make up the vaginal canal. Designed to keep a more normal acid balance (ph) Once placed in the vagina, it will last between two to three days.  Use 2-3 times per week at bedtime  Ingredients to avoid is glycerin and fragrance, can increase chance of infection Should not be used just before sex due to causing irritation Most are gels administered either in a tampon-shaped applicator or as a vaginal suppository. They are non-hormonal.   Types of Moisturizers(internal use)  Vitamin E vaginal suppositories- Whole foods, Amazon Moist Again Coconut oil- can break down condoms, any grocery store (prefer organic) Julva- (Do no use if taking  Tamoxifen) amazon Yes moisturizer- amazon NeuEve Silk , NeuEve Silver for menopausal or over 65 (if have severe vaginal atrophy or cancer treatments use  NeuEve Silk for  1 month than move to [employer])- [employer], [place].com Olive and Bee intimate cream- [email or web address].au Mae vaginal moisturizer- Amazon Aloe Good Clean Love Hyaluronic acid Hyalofemme Reveree hyaluronic acid inserts   Creams to use externally on the Vulva area Marathon Oil (good for for cancer patients that had radiation to the area)- [place] or [email or web address] Vulva Balm/ V-magic cream by medicine mama- amazon Julva-amazon Vital "V Wild Yam salve ( help moisturize and help with thinning vulvar area, does have Beeswax MoodMaid Botanical Pro-Meno Wild Yam Cream- Amazon Desert Harvest Gele Cleo by Zane Herald labial moisturizer (Amazon),  Coconut or olive oil aloe Good Clean Love Enchanted Rose by intimate rose  Things to avoid in the vaginal area Do not use things to irritate the vulvar area No lotions just specialized creams for the vulva area- Neogyn, V-magic,  No soaps; can use Aveeno or Calendula cleanser, unscented Dove if needed. Must be gentle No deodorants No douches Good to sleep without underwear to let the vaginal area to air out No scrubbing: spread the lips to let warm water rinse over labias and pat dry   Nicole [name], PT [facility] [address] [place], [place] [address] W: [phone] [name]

## 2023-07-09 NOTE — Therapy (Signed)
OUTPATIENT PHYSICAL THERAPY FEMALE PELVIC TREATMENT   Patient Name: Nicole [name] MRN: [id number] DOB:12/04/1960, 62 y.o., female Today's Date: 07/09/2023  END OF SESSION:  PT End of Session - 07/09/23 1504     Visit Number 5    Date for PT Re-Evaluation 08/13/23    Authorization Type [employer] medicare/ medicaid    Authorization - Visit Number 5    Authorization - Number of Visits 10    PT Start Time 1500    PT Stop Time 1545    PT Time Calculation (min) 45 min    Activity Tolerance Patient tolerated treatment well    Behavior During Therapy WFL for tasks assessed/performed             Past Medical History:  Diagnosis Date   Anemia    Anxiety    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 01/18/2022   Sensation of food items getting stuck in upper esophagus to be evaluated first by esophagram and then by EGD with Dr. Leone Payor over the next few weeks.   Hypercholesterolemia    Morphea 2022   Nodule of skin of breast 01/04/2021   Obesity    OSA (obstructive sleep apnea) 12/27/2015   Palpitations    negative cardiac w/u per Dr. Daleen Squibb 2/09   Reflux esophagitis    Sleep apnea    No CPAP   Vaginal discharge 03/02/2020   Past Surgical History:  Procedure Laterality Date   COLONOSCOPY     ECTOPIC PREGNANCY SURGERY     EXPLORATORY LAPAROTOMY  2000   x2 (Dr. 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 07/03/2021   Other personal history of psychological trauma, not elsewhere classified 03/16/2021   Throat irritation 12/16/2019   Hyperlipidemia 11/10/2019   History of recurrent UTI (urinary tract infection) 11/12/2017   Marijuana use 11/12/2017   Hoarseness of voice 10/25/2016   Pain in left shoulder 07/20/2016   Hematuria of unknown etiology 06/01/2013   Tinea pedis 04/14/2007    PCP: Miguel Aschoff, MD  REFERRING PROVIDER: Lorriane Shire, MD  REFERRING DIAG: M79.10 (ICD-10-CM) - Myalgia   THERAPY DIAG:  Cramp and spasm  Pelvic pain  Rationale for Evaluation and Treatment: Rehabilitation  ONSET DATE: 2022  SUBJECTIVE:  SUBJECTIVE STATEMENT: I had sharp pain after the last visit in the rectal area.     PAIN:  Are you having pain? Yes NPRS scale: 8/10 Pain location: Vaginal and suprapubic bone  Pain type: dull and sharp Pain description: intermittent   Aggravating factors: vaginal penetration, sitting Relieving factors: no vaginal penetration, bowel movements  PRECAUTIONS: None  RED FLAGS: None   WEIGHT BEARING RESTRICTIONS: No  FALLS:  Has patient fallen in last 6 months? No  LIVING ENVIRONMENT: Lives with: lives alone   OCCUPATION: not sure  PLOF: Independent  PATIENT GOALS: reduce pain and make it easier for bowel  movements  PERTINENT HISTORY:  See above   BOWEL MOVEMENT: Pain with bowel movement: Yes Type of bowel movement:Type (Bristol Stool Scale) Type 1, Frequency 1-2 times per day, Strain Yes, and Splinting yes, vaginally Fully empty rectum: No Leakage: No Fiber supplement: Yes: Murelax  URINATION: Pain with urination: No Fully empty bladder: Yes: sometime has to go back 2 minutes later to urinate again Stream: Strong Urgency: No Frequency: 3-5 hours Leakage:  none Pads: No  INTERCOURSE: Pain with intercourse: Initial Penetration, During Penetration, After Intercourse, and Pain Interrupts Intercourse Ability to have vaginal penetration:  Yes: will get tearing Climax: yes Marinoff Scale: 2-3/3  PREGNANCY: Vaginal deliveries 3 Tearing Yes: grade 4 and episiotomy  OBJECTIVE:   DIAGNOSTIC FINDINGS:  none  PATIENT SURVEYS:  POPIQ-7 29 CRAIQ-7 24 PFIQ-7 52 06/27/23 POPIQ-7 29; CRAIQ-7 24; PFIQ-7 52   COGNITION: Overall cognitive status: Within functional limits for tasks assessed     SENSATION: Light touch: Appears intact Proprioception: Appears intact    POSTURE: No Significant postural limitations  PELVIC ALIGNMENT: ASIS are equal   LOWER EXTREMITY ROM: Bilateral hip ROM is full   LOWER EXTREMITY MMT:  MMT Right eval Left eval Left  06/27/23  Hip extension 5/5 4/5 4/5  Hip abduction 5/5 4/5 4/5  Hip adduction 5/5 4/5 5/5   PALPATION:   General  abdominal area is tender; tenderness supra pubically; scar suprapubic area is limited;                 External Perineal Exam tenderness located on the perineal  body, along the ischiocavernosus, bulbocavernosus, levator ani and obturator internist                             Internal Pelvic Floor not assessed at this time  Patient confirms identification and approves PT to assess internal pelvic floor and treatment No 06/27/23: patient has informed therapist she will not be comfortable with the internal  assessment of the pelvic floor and will not be part of the treartment.          TONE: increased  TODAY'S TREATMENT:     07/09/23 Neuromuscular re-education: Down training: Diaphragmatic breathing to relax the pelvic floor Exercises: Stretches/mobility: Educated patient on using vaginal moisturizers, how to apply to the vaginal canal and vulva to improve the health of the tissue Educated patient on importance of using a vaginal lubricant and how it helps with reduction of tearing of the vaginal and vulvar area Educated patient on how to use tennis ball to massage the pelvic floor and used a ball in therapy to return demonstration Therapeutic activities: Functional strengthening activities: Educated patient on how to have a bowel movement with her feet on a stool, diaphragmatic breathing, breathing out to push stool out.     07/03/23 Neuromuscular re-education: Pelvic  floor contraction training: Using the RUSI using the pelvic floor setting supapubic area working on contracting the pelvic floor and bulging without using the gluteals Down training: Exercises: Stretches/mobility: Educated patient on using vaginal moisturizers and how it improves the health and elasticity of the tissue to reduce the tearing of the vaginal tissue Educated patient to massage the vulva tissue to improve the blood flow  Strengthening: Sitting pelvic floor contraction without using the buttocks holding 10 sec 10 x      06/27/23 Manual: Soft tissue mobilization: Circular massage to the abdomen to promote peristalic motion of the intestines Scar tissue mobilization: Scar massage to lower abdomen to improve tissue mobility Myofascial release: Fascial release around the lower abdomen and upper to reduce fascial restrictions to improve bowel movements.  Neuromuscular re-education: Down training: Diaphragmatic breathing to relax the pelvic floor and work on  toileting Exercises: Stretches/mobility: Sitting piriformis stretch holding 30 sec bil.  Hamstring stretch in sitting holding 30 sec bil.  Therapeutic activities: Functional strengthening activities: Siting with diaphragmatic breathing, knees above hips, breathing into fist to generate pressure to push stool out   PATIENT EDUCATION: 07/09/23 Education details: Access Code: [email or web address], ways to sit on commode to have a bowel movement, how to massage the vulvar area and place into the vaginal canal Person educated: Patient Education method: Explanation, Demonstration, Tactile cues, Verbal cues, and Handouts Education comprehension: verbalized understanding, returned demonstration, verbal cues required, tactile cues required, and needs further education   HOME EXERCISE PROGRAM: 07/03/23 Access Code: [email or web address] URL: [email or web address] Date: 06/27/2023 Prepared by: [name]  Exercises - Seated Piriformis Stretch with Trunk Bend  - 1 x daily - 7 x weekly - 1 sets - 2 reps - 30 sec hold - Seated Hamstring Stretch  - 1 x daily - 7 x weekly - 1 sets - 2 reps - 30 sec hold - Supine Piriformis Stretch with Leg Straight  - 1 x daily - 7 x weekly - 1 sets - 2 reps - 30 sec hold - Happy Baby with Pelvic Floor Lengthening  - 1 x daily - 7 x weekly - 1 sets - 1 reps - 30 sec hold - Supine Abdominal Wall Massage  - 1 x daily - 7 x weekly - 3 sets - 10 reps - Seated Pelvic Floor Contraction  - 3 x daily - 7 x weekly - 1 sets - 10 reps - 10 sec hold  ASSESSMENT:  CLINICAL IMPRESSION: Patient is a 62 y.o. female who was seen today for physical therapy  treatment for myalgia. Patient is now getting her vaginal pain 3 times per month instead of 3 times per week. Patient does not strain when her stool soft and she eats almonds. Patient still tears with penile penetration vaginally.  Patient has learned how to use vaginal moisturizers and massage the perineal area to reduce the tearing of  the area.  She understands how to bulge the pelvic floor. Patient will benefit from skilled therapy to reduce her pain and improve ease of bowel movements.   OBJECTIVE IMPAIRMENTS: decreased coordination, decreased endurance, decreased strength, increased fascial restrictions, increased muscle spasms, and pain.   ACTIVITY LIMITATIONS: sitting and toileting  PARTICIPATION LIMITATIONS: meal prep, cleaning, interpersonal relationship, and community activity  PERSONAL FACTORS: Age, Fitness, Time since onset of injury/illness/exacerbation, and 1-2 comorbidities: depression, recurrent UTI  are also affecting patient's functional outcome.   REHAB POTENTIAL: Good  CLINICAL DECISION MAKING: Evolving/moderate complexity  EVALUATION COMPLEXITY: Moderate   GOALS: Goals reviewed  with patient? Yes  SHORT TERM GOALS: Target date: 05/20/23  Patient independent with hip stretches to elongate the pelvic floor muscles.  Baseline: Goal status: Met 06/27/23  2.  Patient is independent with abdominal massage to promote peristalic motion of the intestines.  Baseline:  Goal status: Met 06/27/23  3.  Patient is educated on perineal massage to assist with elongation of the tissue to reduce the chance of tearing.  Baseline:  Goal status: Met 07/09/23   LONG TERM GOALS: Target date: 08/13/23 Patient is independent with advanced HEP for pelvic floor lengthening.  Baseline:  Goal status: ongoing 06/27/23  2.  Patient is able to have vaginal penetration with pain level >/= 2-3/10 due to the ability to relax the pelvic floor with diaphragmatic breathing.  Baseline:  Goal status: ongoing ll/7/24  3.  Patient reports straining with bowel movements decreased >/= 2/10 due to the ability to relax the pelvic floor while pushing the stool out.  Baseline:  Goal status: ongoing 06/27/23  4.  Patient is able to sit with discomfort </= 3/10 due to reduction of trigger points.  Baseline:  Goal status: ongoing  06/27/23  5.  PFIQ-7 goes from 52 to 20 due to decreased pain and decreased frustration.  Baseline:  Goal status: ongoing 06/27/23   PLAN:  PT FREQUENCY: 1x/week  PT DURATION: 1 week  PLANNED INTERVENTIONS: Therapeutic exercises, Therapeutic activity, Neuromuscular re-education, Balance training, Patient/Family education, Joint mobilization, Dry Needling, Electrical stimulation, Cryotherapy, Moist heat, Ultrasound, Biofeedback, and Manual therapy  PLAN FOR NEXT SESSION:  review abdominal massage,  diaphragmatic breathing, s,  update HEP, next visit is 08/13/23 but trying get her in sooner.   [name], PT 07/09/23 3:47 PM

## 2023-07-16 ENCOUNTER — Ambulatory Visit (HOSPITAL_COMMUNITY): Payer: 59

## 2023-07-26 ENCOUNTER — Telehealth (HOSPITAL_COMMUNITY): Payer: Self-pay | Admitting: *Deleted

## 2023-07-26 NOTE — Telephone Encounter (Signed)
Reaching out to patient to offer assistance regarding upcoming cardiac imaging study; pt verbalizes understanding of appt date/time, parking situation and where to check in, pre-test NPO status and medications ordered, and verified current allergies; name and call back number provided for further questions should they arise  Nicole Brick RN Navigator Cardiac Imaging [facility] [phone] office [phone] cell  Patient to take 50mg  metoprolol tartrate two hours prior to her cardiac CT scan. She is aware to arrive at 1:30 PM.

## 2023-07-29 ENCOUNTER — Ambulatory Visit (HOSPITAL_COMMUNITY)
Admission: RE | Admit: 2023-07-29 | Discharge: 2023-07-29 | Disposition: A | Discharge status: Home / Self Care | Payer: 59 | Source: Ambulatory Visit | Attending: Cardiology | Admitting: Cardiology

## 2023-07-29 ENCOUNTER — Encounter: Payer: Self-pay | Admitting: Physical Therapy

## 2023-07-29 DIAGNOSIS — I2510 Atherosclerotic heart disease of native coronary artery without angina pectoris: Secondary | ICD-10-CM | POA: Diagnosis not present

## 2023-07-29 DIAGNOSIS — R072 Precordial pain: Secondary | ICD-10-CM | POA: Diagnosis not present

## 2023-07-29 MED ORDER — NITROGLYCERIN 0.4 MG SL SUBL
SUBLINGUAL_TABLET | SUBLINGUAL | Status: AC
  Filled 2023-07-29: qty 2

## 2023-07-29 MED ORDER — IOHEXOL 350 MG/ML SOLN
100.0000 mL | Freq: Once | INTRAVENOUS | Status: AC | PRN
  Administered 2023-07-29: 100 mL via INTRAVENOUS

## 2023-07-29 MED ORDER — NITROGLYCERIN 0.4 MG SL SUBL
0.8000 mg | SUBLINGUAL_TABLET | Freq: Once | SUBLINGUAL | Status: AC
  Administered 2023-07-29: 0.8 mg via SUBLINGUAL

## 2023-07-30 ENCOUNTER — Encounter: Payer: 59 | Attending: Obstetrics and Gynecology | Admitting: Physical Therapy

## 2023-07-30 ENCOUNTER — Encounter: Payer: Self-pay | Admitting: Physical Therapy

## 2023-07-30 DIAGNOSIS — R102 Pelvic and perineal pain: Secondary | ICD-10-CM | POA: Insufficient documentation

## 2023-07-30 DIAGNOSIS — R252 Cramp and spasm: Secondary | ICD-10-CM | POA: Insufficient documentation

## 2023-07-30 NOTE — Therapy (Addendum)
OUTPATIENT PHYSICAL THERAPY FEMALE PELVIC TREATMENT   Patient Name: Nicole [name] MRN: [id number] DOB:[birth year], 62 y.o., female Today's Date: 07/30/2023  END OF SESSION:  PT End of Session - 07/30/23 1510     Visit Number 6    Date for PT Re-Evaluation 08/13/23    Authorization Type [employer] medicare/ medicaid    Authorization - Visit Number 6    Authorization - Number of Visits 10    PT Start Time 1500    PT Stop Time 1545    PT Time Calculation (min) 45 min    Activity Tolerance Patient tolerated treatment well    Behavior During Therapy WFL for tasks assessed/performed             Past Medical History:  Diagnosis Date   Anemia    Anxiety    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 01/18/2022   Sensation of food items getting stuck in upper esophagus to be evaluated first by esophagram and then by EGD with Dr. Leone Payor over the next few weeks.   Hypercholesterolemia    Morphea 2022   Nodule of skin of breast 01/04/2021   Obesity    OSA (obstructive sleep apnea) 12/27/2015   Palpitations    negative cardiac w/u per Dr. Daleen Squibb 2/09   Reflux esophagitis    Sleep apnea    No CPAP   Vaginal discharge 03/02/2020   Past Surgical History:  Procedure Laterality Date   COLONOSCOPY     ECTOPIC PREGNANCY SURGERY     EXPLORATORY LAPAROTOMY  2000   x2 (Dr. 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 07/03/2021   Other personal history of psychological trauma, not elsewhere classified 03/16/2021   Throat irritation 12/16/2019   Hyperlipidemia 11/10/2019   History of recurrent UTI (urinary tract infection) 11/12/2017   Marijuana use 11/12/2017   Hoarseness of voice 10/25/2016   Pain in left shoulder 07/20/2016   Hematuria of unknown etiology 06/01/2013   Tinea pedis 04/14/2007    PCP: Miguel Aschoff, MD  REFERRING PROVIDER: Lorriane Shire, MD  REFERRING DIAG: M79.10 (ICD-10-CM) - Myalgia   THERAPY DIAG:  Cramp and spasm  Pelvic pain  Rationale for Evaluation and Treatment: Rehabilitation  ONSET DATE: 2022  SUBJECTIVE:  SUBJECTIVE STATEMENT: I feel better. I have been using the lubrication. I have been using the moisturizers on the vaginal area and it helps. I have not been getting vaginal pain while sitting.     PAIN:  Are you having pain? Yes NPRS scale: 8/10 Pain location: Vaginal and suprapubic bone  Pain type: dull and sharp Pain description: intermittent   Aggravating factors: vaginal penetration, sitting Relieving factors: no vaginal penetration, bowel movements  PRECAUTIONS: None  RED FLAGS: None   WEIGHT BEARING RESTRICTIONS: No  FALLS:  Has patient fallen in last 6 months? No  LIVING ENVIRONMENT: Lives with: lives  alone   OCCUPATION: not sure  PLOF: Independent  PATIENT GOALS: reduce pain and make it easier for bowel movements  PERTINENT HISTORY:  See above   BOWEL MOVEMENT: Pain with bowel movement: Yes Type of bowel movement:Type (Bristol Stool Scale) Type 1, Frequency 1-2 times per day, Strain Yes, and Splinting yes, vaginally Fully empty rectum: No Leakage: No Fiber supplement: Yes: Murelax  URINATION: Pain with urination: No Fully empty bladder: Yes: sometime has to go back 2 minutes later to urinate again Stream: Strong Urgency: No Frequency: 3-5 hours Leakage:  none Pads: No  INTERCOURSE: Pain with intercourse: Initial Penetration, During Penetration, After Intercourse, and Pain Interrupts Intercourse Ability to have vaginal penetration:  Yes: will get tearing Climax: yes Marinoff Scale: 2-3/3  PREGNANCY: Vaginal deliveries 3 Tearing Yes: grade 4 and episiotomy  OBJECTIVE:   DIAGNOSTIC FINDINGS:  none  PATIENT SURVEYS:  POPIQ-7 29 CRAIQ-7 24 PFIQ-7 52 06/27/23 POPIQ-7 29; CRAIQ-7 24; PFIQ-7 52   COGNITION: Overall cognitive status: Within functional limits for tasks assessed     SENSATION: Light touch: Appears intact Proprioception: Appears intact    POSTURE: No Significant postural limitations  PELVIC ALIGNMENT: ASIS are equal   LOWER EXTREMITY ROM: Bilateral hip ROM is full   LOWER EXTREMITY MMT:  MMT Right eval Left eval Left  06/27/23  Hip extension 5/5 4/5 4/5  Hip abduction 5/5 4/5 4/5  Hip adduction 5/5 4/5 5/5   PALPATION:   General  abdominal area is tender; tenderness supra pubically; scar suprapubic area is limited;                 External Perineal Exam tenderness located on the perineal  body, along the ischiocavernosus, bulbocavernosus, levator ani and obturator internist                             Internal Pelvic Floor not assessed at this time  Patient confirms identification and approves PT to assess internal pelvic  floor and treatment No 06/27/23: patient has informed therapist she will not be comfortable with the internal assessment of the pelvic floor and will not be part of the treartment.          TONE: increased  TODAY'S TREATMENT:     07/30/23 Exercises: Stretches/mobility: Piriformis stretch in sitting holding 30 sec each Sitting hamstring stretch holding 30 sec bil.  Happy baby holding 30 sec in sitting Diaphragmatic breathing with tactile cues and verbal cues with some difficulty Strengthening: Supine hip flexor isometric 20 x Supine diagonal hip flexor isometric 20 x  Bridges 20 x    07/09/23 Neuromuscular re-education: Down training: Diaphragmatic breathing to relax the pelvic floor Exercises: Stretches/mobility: Educated patient on using vaginal moisturizers, how to apply to the vaginal canal and vulva to improve the health of the tissue Educated patient on importance  of using a vaginal lubricant and how it helps with reduction of tearing of the vaginal and vulvar area Educated patient on how to use tennis ball to massage the pelvic floor and used a ball in therapy to return demonstration Therapeutic activities: Functional strengthening activities: Educated patient on how to have a bowel movement with her feet on a stool, diaphragmatic breathing, breathing out to push stool out.     07/03/23 Neuromuscular re-education: Pelvic floor contraction training: Using the RUSI using the pelvic floor setting supapubic area working on contracting the pelvic floor and bulging without using the gluteals Down training: Exercises: Stretches/mobility: Educated patient on using vaginal moisturizers and how it improves the health and elasticity of the tissue to reduce the tearing of the vaginal tissue Educated patient to massage the vulva tissue to improve the blood flow  Strengthening: Sitting pelvic floor contraction without using the buttocks holding 10 sec 10 x      PATIENT  EDUCATION: 07/09/23 Education details: Access Code: [email or web address], ways to sit on commode to have a bowel movement, how to massage the vulvar area and place into the vaginal canal Person educated: Patient Education method: Explanation, Demonstration, Tactile cues, Verbal cues, and Handouts Education comprehension: verbalized understanding, returned demonstration, verbal cues required, tactile cues required, and needs further education   HOME EXERCISE PROGRAM: 07/03/23 Access Code: [email or web address] URL: [email or web address] Date: 06/27/2023 Prepared by: [name]  Exercises - Seated Piriformis Stretch with Trunk Bend  - 1 x daily - 7 x weekly - 1 sets - 2 reps - 30 sec hold - Seated Hamstring Stretch  - 1 x daily - 7 x weekly - 1 sets - 2 reps - 30 sec hold - Supine Piriformis Stretch with Leg Straight  - 1 x daily - 7 x weekly - 1 sets - 2 reps - 30 sec hold - Happy Baby with Pelvic Floor Lengthening  - 1 x daily - 7 x weekly - 1 sets - 1 reps - 30 sec hold - Supine Abdominal Wall Massage  - 1 x daily - 7 x weekly - 3 sets - 10 reps - Seated Pelvic Floor Contraction  - 3 x daily - 7 x weekly - 1 sets - 10 reps - 10 sec hold  ASSESSMENT:  CLINICAL IMPRESSION: Patient is a 62 y.o. female who was seen today for physical therapy  treatment for myalgia. Patient is not having pain with sitting.  Patient does not strain when her stool soft and she eats almonds. Patient still tears with penile penetration vaginally.  Patient has learned how to use vaginal moisturizers and massage the perineal area to reduce the tearing of the area. Patient is able to push her stool out with 20% greater ease. She is not using her hand as much to press on the rectum to assist stool. Patient still has to void urine 2 times.  Patient had to rest after exercises due to feeling tired and winded. She understands how to bulge the pelvic floor. Patient will benefit from skilled therapy to reduce her pain and improve  ease of bowel movements.   OBJECTIVE IMPAIRMENTS: decreased coordination, decreased endurance, decreased strength, increased fascial restrictions, increased muscle spasms, and pain.   ACTIVITY LIMITATIONS: sitting and toileting  PARTICIPATION LIMITATIONS: meal prep, cleaning, interpersonal relationship, and community activity  PERSONAL FACTORS: Age, Fitness, Time since onset of injury/illness/exacerbation, and 1-2 comorbidities: depression, recurrent UTI  are also affecting patient's functional outcome.   REHAB POTENTIAL: Good  CLINICAL DECISION MAKING: Evolving/moderate  complexity  EVALUATION COMPLEXITY: Moderate   GOALS: Goals reviewed with patient? Yes  SHORT TERM GOALS: Target date: 05/20/23  Patient independent with hip stretches to elongate the pelvic floor muscles.  Baseline: Goal status: Met 06/27/23  2.  Patient is independent with abdominal massage to promote peristalic motion of the intestines.  Baseline:  Goal status: Met 06/27/23  3.  Patient is educated on perineal massage to assist with elongation of the tissue to reduce the chance of tearing.  Baseline:  Goal status: Met 07/09/23   LONG TERM GOALS: Target date: 08/13/23 Patient is independent with advanced HEP for pelvic floor lengthening.  Baseline:  Goal status: ongoing 06/27/23  2.  Patient is able to have vaginal penetration with pain level >/= 2-3/10 due to the ability to relax the pelvic floor with diaphragmatic breathing.  Baseline:  Goal status: ongoing ll/7/24  3.  Patient reports straining with bowel movements decreased >/= 2/10 due to the ability to relax the pelvic floor while pushing the stool out.  Baseline: improved by 20% Goal status: ongoing 06/30/23  4.  Patient is able to sit with discomfort </= 3/10 due to reduction of trigger points.  Baseline: no pain Goal status: met 07/30/23  5.  PFIQ-7 goes from 52 to 20 due to decreased pain and decreased frustration.  Baseline:  Goal status:  ongoing 06/27/23   PLAN:  PT FREQUENCY: 1x/week  PT DURATION: 1 week  PLANNED INTERVENTIONS: Therapeutic exercises, Therapeutic activity, Neuromuscular re-education, Balance training, Patient/Family education, Joint mobilization, Dry Needling, Electrical stimulation, Cryotherapy, Moist heat, Ultrasound, Biofeedback, and Manual therapy  PLAN FOR NEXT SESSION:  review abdominal massage,  update HEP, renewal to continue  [name], PT 07/30/23 3:50 PM   PHYSICAL THERAPY DISCHARGE SUMMARY  Visits from Start of Care: 6  Current functional level related to goals / functional outcomes: See above. Unable to assess patient due to her not returning to therapy for final assessment.    Remaining deficits: See above.    Education / Equipment: HEP   Patient agrees to discharge. Patient goals were not met. Patient is being discharged due to not returning since the last visit. Thank you for the referral.   [name], PT 09/23/23 4:23 PM

## 2023-07-31 ENCOUNTER — Telehealth: Payer: Self-pay | Admitting: Cardiology

## 2023-07-31 NOTE — Telephone Encounter (Signed)
Called and spoke to patient. Below results relayed per Dr Adrian Lions. Patient verbalized understanding and no further questions.  Rollene Rotunda, MD 07/31/2023  7:56 AM EST   There is mid LAD plaque that is less than 50% stenosed and mild plaque elsewhere.   No change in therapy or further testing.  I am waiting for the radiology over read.    Call Ms. Bialecki with the results and send results to Miguel Aschoff, MD

## 2023-07-31 NOTE — Telephone Encounter (Signed)
Patient is calling to follow up on CT Coronary Morph results. Please advise.

## 2023-08-13 ENCOUNTER — Ambulatory Visit: Payer: 59 | Admitting: Physical Therapy

## 2023-08-13 ENCOUNTER — Encounter: Payer: Self-pay | Admitting: Physical Therapy

## 2023-08-20 ENCOUNTER — Ambulatory Visit (INDEPENDENT_AMBULATORY_CARE_PROVIDER_SITE_OTHER): Payer: 59 | Admitting: Podiatry

## 2023-08-20 ENCOUNTER — Encounter: Payer: Self-pay | Admitting: Podiatry

## 2023-08-20 DIAGNOSIS — D2371 Other benign neoplasm of skin of right lower limb, including hip: Secondary | ICD-10-CM

## 2023-08-20 NOTE — Progress Notes (Signed)
 She presents today chief complaint of painful lesions to the plantar aspect of the fifth metatarsals bilaterally states that her feet are tingling because she ate a whole box of chocolates that she got after Christmas within 3 days.  Objective: Vital signs are stable she is alert oriented x 3 pulses are palpable.  There is no erythema edema salines drainage odor benign skin lesions plantar aspect of the fifth metatarsals bilateral.  Circulating blood sugar was 106 mg/dL.  Assessment: Pain limb secondary to plantarflexed fifth metatarsals benign skin lesions.  Plan: Debridement of these lesions on an as-needed basis.

## 2023-08-27 ENCOUNTER — Encounter: Payer: 59 | Admitting: Physical Therapy

## 2023-09-11 ENCOUNTER — Ambulatory Visit: Payer: 59

## 2023-09-11 VITALS — Ht 65.5 in | Wt 185.0 lb

## 2023-09-11 DIAGNOSIS — Z0000 Encounter for general adult medical examination without abnormal findings: Secondary | ICD-10-CM | POA: Diagnosis not present

## 2023-09-11 NOTE — Patient Instructions (Signed)
Nicole [name] , Thank you for taking time to come for your Medicare Wellness Visit. I appreciate your ongoing commitment to your health goals. Please review the following plan we discussed and let me know if I can assist you in the future.   Referrals/Orders/Follow-Ups/Clinician Recommendations: Yes; Keep maintaining your health by keeping your appointments with Dr. Mayford Knife and any specialists that you may see.  Call us if you need anything.  Have a great year!!!!  This is a list of the screening recommended for you and due dates:  Health Maintenance  Topic Date Due   COVID-19 Vaccine (1) Never done   Pneumococcal Vaccination (1 of 2 - PCV) Never done   Zoster (Shingles) Vaccine (1 of 2) Never done   DTaP/Tdap/Td vaccine (3 - Td or Tdap) 10/28/2022   Mammogram  08/25/2023   Medicare Annual Wellness Visit  09/10/2024   Pap with HPV screening  06/24/2025   Colon Cancer Screening  01/19/2027   Hepatitis C Screening  Completed   HIV Screening  Completed   HPV Vaccine  Aged Out   Flu Shot  Discontinued    Advanced directives: (Declined) Advance directive discussed with you today. Even though you declined this today, please call our office should you change your mind, and we can give you the proper paperwork for you to fill out.  Next Medicare Annual Wellness Visit scheduled for next year: No

## 2023-09-11 NOTE — Progress Notes (Signed)
Subjective:   Nicole [name] is a 63 y.o. female who presents for Medicare Annual (Subsequent) preventive examination.  Visit Complete: Virtual I connected with  Nicole [name] on 09/11/23 by a audio enabled telemedicine application and verified that I am speaking with the correct person using two identifiers.  Patient Location: Home  Provider Location: Home Office  I discussed the limitations of evaluation and management by telemedicine. The patient expressed understanding and agreed to proceed.  Vital Signs: Because this visit was a virtual/telehealth visit, some criteria may be missing or patient reported. Any vitals not documented were not able to be obtained and vitals that have been documented are patient reported.  This patient declined Interactive audio and Acupuncturist. Therefore the visit was completed with audio only.  Cardiac Risk Factors include: dyslipidemia;family history of premature cardiovascular disease;obesity (BMI >30kg/m2)     Objective:    Today's Vitals   09/11/23 1042  Weight: 185 lb (83.9 kg)  Height: 5' 5.5" (1.664 m)  PainSc: 0-No pain   Body mass index is 30.32 kg/m.     09/11/2023   10:44 AM 06/16/2023    2:19 AM 05/02/2023   10:54 AM 04/23/2023   11:00 AM 02/12/2023    1:17 PM 01/08/2023   10:45 AM 12/27/2022    8:35 AM  Advanced Directives  Does Patient Have a Medical Advance Directive? No No No No No No No  Would patient like information on creating a medical advance directive? No - Patient declined  No - Patient declined No - Patient declined   No - Patient declined    Current Medications (verified) Outpatient Encounter Medications as of 09/11/2023  Medication Sig   albuterol (VENTOLIN HFA) 108 (90 Base) MCG/ACT inhaler Inhale into the lungs every 6 (six) hours as needed for wheezing or shortness of breath.   alendronate (FOSAMAX) 70 MG tablet Take 70 mg by mouth once a week.   clotrimazole-betamethasone (LOTRISONE) cream Apply 1  Application topically 2 (two) times daily.   metoprolol tartrate (LOPRESSOR) 50 MG tablet Take 50 mg 2 hours before Coronary CT   ondansetron (ZOFRAN) 4 MG tablet Take 1 tablet (4 mg total) by mouth every 6 (six) hours.   No facility-administered encounter medications on file as of 09/11/2023.    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 01/18/2022   Sensation of food items getting stuck in upper esophagus to be evaluated first by esophagram and then by EGD with Dr. Leone Payor over the next few weeks.   Hypercholesterolemia    Morphea 2022   Nodule of skin of breast 01/04/2021   Obesity    OSA (obstructive sleep apnea) 12/27/2015   Palpitations    negative cardiac w/u per Dr. Daleen Squibb 2/09   Reflux esophagitis    Sleep apnea    No CPAP   Vaginal discharge 03/02/2020   Past Surgical History:  Procedure Laterality Date   COLONOSCOPY     ECTOPIC PREGNANCY SURGERY     EXPLORATORY LAPAROTOMY  2000   x2 (Dr. Dierdre Forth)   HERPES SIMPLEX VIRUS DFA     TUBAL LIGATION  63   Family History  Problem Relation Age of Onset   Hypertension Mother    Diabetes Mother    Heart disease Mother    Thyroid disease Mother    Glaucoma Mother    Diabetes Father    Hypertension Sister    Diabetes Sister    Diabetes Sister    Hypertension Sister    Sleep apnea Sister    Hypertension Sister    Diabetes Sister    Hypertension Brother    Diabetes Brother    Pancreatic cancer Maternal Uncle    Heart attack Maternal Grandmother    Heart attack Maternal Grandfather    Mental illness Son    Mental illness Son    Pancreatic cancer Other        family history   Colon  cancer Neg Hx    Social History   Socioeconomic History   Marital status: Single    Spouse name: Not on file   Number of children: 3   Years of education: Not on file   Highest education level: Not on file  Occupational History   Not on file  Tobacco Use   Smoking status: Former    Current packs/day: 0.00    Average packs/day: 0.3 packs/day for 36.0 years (9.0 ttl pk-yrs)    Types: Cigarettes    Start date: 05/18/1986    Quit date: 05/18/2022    Years since quitting: 1.3   Smokeless tobacco: Never  Vaping Use   Vaping status: Never Used  Substance and Sexual Activity   Alcohol use: Yes    Comment: mixed drink once a month   Drug use: Yes    Types: Marijuana    Comment: daily, smokes it   Sexual activity: Never  Other Topics Concern   Not on file  Social History Narrative   Current Social History 02/16/2021        Patient lives alone in a [place] which is 2 stories. There are 13 steps with handrails up to the entrance the patient uses.       Patient's method of transportation is city bus.      The highest level of education was high school diploma.      The patient currently disabled.      Identified important Relationships are "My mother, my sisters, children, grandchildren (5)"       Pets : None       Interests / Fun: "Read, watch TV, movies, social media, talk on phone, shopping, travel"       Current Stressors: "Simple people get on my nerves,;drama."       Religious / Personal Beliefs: "Christian, [employer], Pentecostal."       Other: "I stay away from people."      L. Ducatte, BSN, RN-BC              Social Drivers of Health   Financial Resource Strain: Low Risk  (09/11/2023)   Overall Financial Resource Strain (CARDIA)    Difficulty of Paying Living Expenses: Not very hard  Food Insecurity: No Food Insecurity (09/11/2023)   Hunger Vital Sign    Worried About Running Out of Food in the Last Year: Never true    Ran Out of Food in the Last Year: Never  true  Transportation Needs: No Transportation Needs (09/11/2023)   PRAPARE - Administrator, Civil Service (Medical): No    Lack of Transportation (Non-Medical): No  Physical Activity: Insufficiently Active (09/11/2023)  Exercise Vital Sign    Days of Exercise per Week: 4 days    Minutes of Exercise per Session: 20 min  Stress: No Stress Concern Present (09/11/2023)   [employer] of Occupational Health - Occupational Stress Questionnaire    Feeling of Stress : Only a little  Social Connections: Moderately Isolated (09/11/2023)   Social Connection and Isolation Panel [NHANES]    Frequency of Communication with Friends and Family: More than three times a week    Frequency of Social Gatherings with Friends and Family: More than three times a week    Attends Religious Services: 1 to 4 times per year    Active Member of [employer] or Organizations: No    Attends Engineer, structural: Never    Marital Status: Divorced    Tobacco Counseling Counseling given: Not Answered   Clinical Intake:  Pre-visit preparation completed: Yes  Pain : No/denies pain Pain Score: 0-No pain     BMI - recorded: 30.32 Nutritional Status: BMI > 30  Obese Nutritional Risks: None Diabetes: No  How often do you need to have someone help you when you read instructions, pamphlets, or other written materials from your doctor or pharmacy?: 1 - Never What is the last grade level you completed in school?: HSG; SOME COLLEGE  Interpreter Needed?: No  Information entered by :: Madilyn Cephas N. Kayla Deshaies, LPN.   Activities of Daily Living    09/11/2023   10:47 AM 12/27/2022    8:41 AM  In your present state of health, do you have any difficulty performing the following activities:  Hearing? 0 0  Vision? 0 0  Difficulty concentrating or making decisions? 1 0  Comment concentrating issues   Walking or climbing stairs? 0 0  Dressing or bathing? 0 0  Doing errands, shopping? 0 0  Preparing Food  and eating ? N   Using the Toilet? N   In the past six months, have you accidently leaked urine? N   Do you have problems with loss of bowel control? N   Managing your Medications? N   Managing your Finances? N   Housekeeping or managing your Housekeeping? N     Patient Care Team: Miguel Aschoff, MD as PCP - General (Internal Medicine) Rollene Rotunda, MD as PCP - Cardiology (Cardiology) Dwight Bing, MD as Consulting Physician (Obstetrics and Gynecology) Chilton Greathouse, MD as Consulting Physician (Pulmonary Disease) Iva Boop, MD as Consulting Physician (Gastroenterology) Suanne Marker, MD as Consulting Physician (Neurology) [facility] Galen Manila, MD as Referring Physician (Ophthalmology)  Indicate any recent Medical Services you may have received from other than [facility] providers in the past year (date may be approximate).     Assessment:   This is a routine wellness examination for Nicole [name].  Hearing/Vision screen Hearing Screening - Comments:: Some hearing difficulties. No hearing aids.  Vision Screening - Comments:: Wears rx glasses, has cataracts - up to date with routine eye exams with Lonia Blood, [facility] at [facility]    Goals Addressed             This Visit's Progress    My goal for 2025 is to lose weight and get back into the gym.        Depression Screen    09/11/2023   10:48 AM 03/18/2023   10:07 AM 02/13/2023    2:12 PM 12/27/2022    8:40 AM 09/25/2022    4:37 PM 09/06/2022   11:28 AM 08/22/2022  4:23 PM  PHQ 2/9 Scores  PHQ - 2 Score 0 1 1 0 0 1 1  PHQ- 9 Score 0 5 5  3 4 4     Fall Risk    09/11/2023   10:46 AM 12/27/2022    8:41 AM 09/25/2022    4:36 PM 09/06/2022   11:28 AM 09/06/2022    9:10 AM  Fall Risk   Falls in the past year? 0 0 0 0 0  Number falls in past yr: 0 0 0 0 0  Injury with Fall? 0 0 0 0 0  Risk for fall due to : No Fall Risks  No Fall Risks No Fall Risks   Follow  up Falls prevention discussed Falls evaluation completed  Falls evaluation completed;Falls prevention discussed Falls evaluation completed    MEDICARE RISK AT HOME: Medicare Risk at Home Any stairs in or around the home?: Yes If so, are there any without handrails?: No Home free of loose throw rugs in walkways, pet beds, electrical cords, etc?: Yes Adequate lighting in your home to reduce risk of falls?: Yes Life alert?: No Use of a cane, walker or w/c?: No Grab bars in the bathroom?: No Shower chair or bench in shower?: No Elevated toilet seat or a handicapped toilet?: No  TIMED UP AND GO:  Was the test performed?  No    Cognitive Function:    09/11/2023   10:47 AM  MMSE - Mini Mental State Exam  Not completed: Unable to complete        09/11/2023   10:47 AM 09/06/2022   11:28 AM  6CIT Screen  What Year? 0 points 0 points  What month? 0 points 0 points  What time? 0 points 0 points  Count back from 20 0 points 0 points  Months in reverse 0 points 0 points  Repeat phrase 0 points 0 points  Total Score 0 points 0 points    Immunizations Immunization History  Administered Date(s) Administered   Td 10/18/2005   Tdap 10/27/2012    TDAP status: Due, Education has been provided regarding the importance of this vaccine. Advised may receive this vaccine at local pharmacy or Health Dept. Aware to provide a copy of the vaccination record if obtained from local pharmacy or Health Dept. Verbalized acceptance and understanding.  Flu Vaccine status: Declined, Education has been provided regarding the importance of this vaccine but patient still declined. Advised may receive this vaccine at local pharmacy or Health Dept. Aware to provide a copy of the vaccination record if obtained from local pharmacy or Health Dept. Verbalized acceptance and understanding.  Pneumococcal vaccine status: Declined,  Education has been provided regarding the importance of this vaccine but patient still  declined. Advised may receive this vaccine at local pharmacy or Health Dept. Aware to provide a copy of the vaccination record if obtained from local pharmacy or Health Dept. Verbalized acceptance and understanding.   Covid-19 vaccine status: Declined, Education has been provided regarding the importance of this vaccine but patient still declined. Advised may receive this vaccine at local pharmacy or Health Dept.or vaccine clinic. Aware to provide a copy of the vaccination record if obtained from local pharmacy or Health Dept. Verbalized acceptance and understanding.  Qualifies for Shingles Vaccine? Yes   Zostavax completed No   Shingrix Completed?: No.    Education has been provided regarding the importance of this vaccine. Patient has been advised to call insurance company to determine out of pocket expense if they  have not yet received this vaccine. Advised may also receive vaccine at local pharmacy or Health Dept. Verbalized acceptance and understanding.  Screening Tests Health Maintenance  Topic Date Due   COVID-19 Vaccine (1) Never done   Pneumococcal Vaccine 47-47 Years old (1 of 2 - PCV) Never done   Zoster Vaccines- Shingrix (1 of 2) Never done   DTaP/Tdap/Td (3 - Td or Tdap) 10/28/2022   MAMMOGRAM  08/25/2023   Medicare Annual Wellness (AWV)  09/10/2024   Cervical Cancer Screening (HPV/Pap Cotest)  06/24/2025   Colonoscopy  01/19/2027   Hepatitis C Screening  Completed   HIV Screening  Completed   HPV VACCINES  Aged Out   INFLUENZA VACCINE  Discontinued    Health Maintenance  Health Maintenance Due  Topic Date Due   COVID-19 Vaccine (1) Never done   Pneumococcal Vaccine 53-8 Years old (1 of 2 - PCV) Never done   Zoster Vaccines- Shingrix (1 of 2) Never done   DTaP/Tdap/Td (3 - Td or Tdap) 10/28/2022   MAMMOGRAM  08/25/2023    Colorectal cancer screening: Type of screening: Colonoscopy. Completed 01/19/2020. Repeat every 7 years  Mammogram status: Completed  08/24/2021-patient declined. Repeat every year  Bone Density status: Never done  Lung Cancer Screening: (Low Dose CT Chest recommended if Age 68-80 years, 20 pack-year currently smoking OR have quit w/in 15years.) does not qualify.   Lung Cancer Screening Referral: no  Additional Screening:  Hepatitis C Screening: does qualify; Completed 08/25/2021  Vision Screening: Recommended annual ophthalmology exams for early detection of glaucoma and other disorders of the eye. Is the patient up to date with their annual eye exam?  Yes  Who is the provider or what is the name of the office in which the patient attends annual eye exams? William L. Porfilio, MD If pt is not established with a provider, would they like to be referred to a provider to establish care? No .   Dental Screening: Recommended annual dental exams for proper oral hygiene.  Community Resource Referral / Chronic Care Management: CRR required this visit?  No   CCM required this visit?  No     Plan:     I have personally reviewed and noted the following in the patient's chart:   Medical and social history Use of alcohol, tobacco or illicit drugs  Current medications and supplements including opioid prescriptions. Patient is not currently taking opioid prescriptions. Functional ability and status Nutritional status Physical activity Advanced directives List of other physicians Hospitalizations, surgeries, and ER visits in previous 12 months Vitals Screenings to include cognitive, depression, and falls Referrals and appointments  In addition, I have reviewed and discussed with patient certain preventive protocols, quality metrics, and best practice recommendations. A written personalized care plan for preventive services as well as general preventive health recommendations were provided to patient.     Mickeal Needy, LPN   11/26/8117   After Visit Summary: (Declined) Due to this being a telephonic visit, with  patients personalized plan was offered to patient but patient Declined AVS at this time   Nurse Notes: Patient declined all vaccines and screening mammogram.

## 2023-09-16 DIAGNOSIS — H40003 Preglaucoma, unspecified, bilateral: Secondary | ICD-10-CM | POA: Diagnosis not present

## 2023-09-16 DIAGNOSIS — H2511 Age-related nuclear cataract, right eye: Secondary | ICD-10-CM | POA: Diagnosis not present

## 2023-09-16 DIAGNOSIS — H2512 Age-related nuclear cataract, left eye: Secondary | ICD-10-CM | POA: Diagnosis not present

## 2023-09-16 DIAGNOSIS — M3501 Sicca syndrome with keratoconjunctivitis: Secondary | ICD-10-CM | POA: Diagnosis not present

## 2023-09-25 DIAGNOSIS — R030 Elevated blood-pressure reading, without diagnosis of hypertension: Secondary | ICD-10-CM | POA: Diagnosis not present

## 2023-09-25 DIAGNOSIS — R7303 Prediabetes: Secondary | ICD-10-CM | POA: Diagnosis not present

## 2023-09-25 DIAGNOSIS — Z0000 Encounter for general adult medical examination without abnormal findings: Secondary | ICD-10-CM | POA: Diagnosis not present

## 2023-09-25 DIAGNOSIS — R5383 Other fatigue: Secondary | ICD-10-CM | POA: Diagnosis not present

## 2023-09-25 DIAGNOSIS — E559 Vitamin D deficiency, unspecified: Secondary | ICD-10-CM | POA: Diagnosis not present

## 2023-09-25 DIAGNOSIS — M8589 Other specified disorders of bone density and structure, multiple sites: Secondary | ICD-10-CM | POA: Diagnosis not present

## 2023-09-25 DIAGNOSIS — D539 Nutritional anemia, unspecified: Secondary | ICD-10-CM | POA: Diagnosis not present

## 2023-09-25 DIAGNOSIS — E7800 Pure hypercholesterolemia, unspecified: Secondary | ICD-10-CM | POA: Diagnosis not present

## 2023-09-25 NOTE — Progress Notes (Signed)
 63 y.o.  Nicole [name] is here for routine visit, her last with me being in 12/2022.  At that time we discussed the incidental finding of coronary artery calcification seen on  CT scan in context of  a pinched discomfort localized in the rib cage which responded to massage.  She had stopped her daily aspirin  by choice as she dislikes taking medications.  She had desired to have her lipids checked at that time, though had had the labs done a couple of months prior at [facility].  I am not able to see these results and she was hesitant to sign a release of information as she did not think it was appropriate for us  to see her records there.  I elected to minimize resource utilization and waited for her to provide a copy of her results.  She had elected to have her DEXA scan done through [facility].  I advised her that she should choose 1 primary care practice for care going forward though she did not appreciate the need to choose.  Since our visit in May of last year, she was evaluated on 5/21 for pain in her right shoulder for which she was referred to outpatient therapy.  She was evaluated by the podiatrist on 5/30 for tinea pedis.  She was seen in the emergency room on 6/25 with left lower quadrant pain and was noted to have gonadal vein congestion on imaging for which a pelvic ultrasound was planned.  She was referred to gynecologist the following day.  Pain was reproduced on palpation of the inguinal ligament and conservative measures were advised.  Referral was placed to pelvic floor PT.  Concerns for relationship safety were identified and she had an opportunity to speak with a behavioral health specialist during the encounter.  She met with integrated behavioral health specialist Ms. McManus for several sessions thereafter.  She reached out to GI Dr. Darilyn practice with concerns about abdominal pain and gurgling noises which were evaluated on 9/12 in the emergency department.  She was evaluated by the  podiatrist on 10/25.  On 10/27 she presented to the emergency room with right arm pain, and followed the next day on 10/28 with ED visit for chest pain.  She was seen in the cardiologist office on 10/30 for follow-up of the chest discomfort.  Concern was adequate enough for anginal features that she was scheduled to have a coronary CT which showed a mid LAD plaque less than 50% stenosed and mild plaque elsewhere for which no change in therapy was needed.  She was evaluated by the podiatrist again on 08/20/2023. She had reported developing burning pain of her feet after eating large amt of chocolate; her feet had begun exuding an ammonia order as well.   Feet have been burning every day, seems to be worse with sweets. Notices both day and night, including when she doesn't have shoes or socks on.  Feels burning on plantar surfaces, symmetric both feet.    Has a kidney or a bladder problem, urine has odor, many blood tests done at [facility] yesterday.  Sees urologist on 10/15/23 to f/u hx of hematuria.  She is ready to complete a previously recommended cystoscopy.  Watched a show about this problem, and is concerned that she has cancer.     Eating bacon to keep her BP up with the salt.  When asked about her cholesterol, she claimed that she would immediately stop eating the bacon and start eating healthy (similar previous  conversations over time).  Endorses being sedentary but desires to get active again.  Yesterday and day before got out to walk.  Declines medications for lipids.    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   She remains convinced that she has morphea and that it is affecting her vision.  She states that she has seen an eye doctor that diagnosed eye morphea.  Her symptoms are eye pain with directional gaze.   Telephone AWV in 08/2023 notable for declining  of all vaccines and mammogram.   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 07/03/2021   Other personal history of psychological trauma, not elsewhere classified 03/16/2021   Hyperlipidemia 11/10/2019   History of recurrent UTI (urinary tract infection) 11/12/2017   Marijuana use 11/12/2017   Hematuria of unknown etiology 06/01/2013   Tinea pedis 04/14/2007    Current Outpatient Medications:    alendronate (FOSAMAX) 70 MG tablet, Take 70 mg by mouth once a week., Disp: , Rfl:   Functional Status: Independent in all ADLs and IADLs.  Objective BP 123/71 (BP Location: Right Arm, Patient Position: Sitting, Cuff Size: Small)   Pulse 80   Temp 97.8 F (36.6 C) (Oral)   Ht 5' 5 (1.651 m)   Wt 176 lb 6.4 oz (80 kg)   LMP 03/03/2016 (Approximate)   SpO2 100%   BMI 29.35 kg/m  Nicely dressed and groomed, very pleasant in interaction.  Quite talkative and tangential though speech is not pressured.  She maintains a stable mood.  Foot exam is documented under problem list below.  No skin findings suggestive of morphea noted about her eyes or face.  Problems addressed today:   Coronary artery disease, non-occlusive Coronary CT on 08/10/2023: Calcium score 93, 88th percentile for age, sex.  Dr. Lavona noted  mid LAD plaque less than 50% stenosed with mild plaque elsewhere.  No anginal symptoms.  She had sometime ago stopped her aspirin  as she does not like taking medications.  Her cholesterol is very high, she is currently eating foods such as bacon (for the salt, to keep my blood pressure up), and recommits, as she has many times in the past, to improving diet and lifestyle.    Hyperlipidemia Most recent lipid panel in our system is 08/2022, TC 236 and LDL 163.  Does not wish to consider medication (dislikes medication generally) and does not see the correlation with cholesterol and CAD, nor with aspirin  and CAD secondary prevention.  She indicated she would be having her lipids rechecked at a cardiology follow-up.  She also has labs done at [facility].  Hematuria of unknown etiology Overdue follow-up appointment with urology scheduled for later this month.  Cystoscopy is planned.  She equates urine  odor with infection and it sounds as though she may have been treated for same UTIs on this basis at [facility].  Urinalysis today is negative.  Feels aching pain in the right flank, no dysuria, and has not noticed recent gross hematuria.  Burning sensation of feet Acute onset after eating large amount of sweets.  Burning on plantar surfaces, symmetric both feet.  No sensation of numbness.  She is not diabetic (she thinks she was tested again yesterday at [facility] and will request that those labs to be sent to us ).  Exam with no abnormal findings of skin or joints.  DP pulses 1+.  Sensation intact to monofilament testing.  Vibratory sense and position sense are intact bilaterally.  Strong ankle dorsiflexion and plantar flexion.  This presentation would be atypical for a B12 deficiency, and she has no signs or symptoms of hypothyroidism.  She does not drink alcohol.  No obvious etiology.  Will monitor.  Bipolar 1 disorder, depressed, mild (HCC) She is currently seeing a behavioral health therapist organized through her  gynecologist.  Declines pharmacotherapy.  She has significant stressors in her personal life.  I am awaiting copies of her recent lab results from [facility].  She wishes to include any abnormalities on those results on her FL2 for home care application.  It isn't clear what her care needs are, as she is independent.

## 2023-09-26 ENCOUNTER — Ambulatory Visit: Payer: 59 | Admitting: Internal Medicine

## 2023-09-26 VITALS — BP 123/71 | HR 80 | Temp 97.8°F | Ht 65.0 in | Wt 176.4 lb

## 2023-09-26 DIAGNOSIS — E7841 Elevated Lipoprotein(a): Secondary | ICD-10-CM

## 2023-09-26 DIAGNOSIS — G629 Polyneuropathy, unspecified: Secondary | ICD-10-CM

## 2023-09-26 DIAGNOSIS — R208 Other disturbances of skin sensation: Secondary | ICD-10-CM | POA: Diagnosis not present

## 2023-09-26 DIAGNOSIS — R399 Unspecified symptoms and signs involving the genitourinary system: Secondary | ICD-10-CM

## 2023-09-26 DIAGNOSIS — F3131 Bipolar disorder, current episode depressed, mild: Secondary | ICD-10-CM

## 2023-09-26 DIAGNOSIS — I2510 Atherosclerotic heart disease of native coronary artery without angina pectoris: Secondary | ICD-10-CM

## 2023-09-26 DIAGNOSIS — E785 Hyperlipidemia, unspecified: Secondary | ICD-10-CM | POA: Diagnosis not present

## 2023-09-26 DIAGNOSIS — R319 Hematuria, unspecified: Secondary | ICD-10-CM | POA: Diagnosis not present

## 2023-09-26 DIAGNOSIS — F418 Other specified anxiety disorders: Secondary | ICD-10-CM | POA: Insufficient documentation

## 2023-09-26 DIAGNOSIS — Z0000 Encounter for general adult medical examination without abnormal findings: Secondary | ICD-10-CM | POA: Diagnosis not present

## 2023-09-26 LAB — POCT URINALYSIS DIPSTICK
Bilirubin, UA: NEGATIVE
Glucose, UA: NEGATIVE
Ketones, UA: NEGATIVE
Leukocytes, UA: NEGATIVE
Nitrite, UA: NEGATIVE
Protein, UA: NEGATIVE
Spec Grav, UA: >=1.03 — AB (ref 1.010–1.025)
Urobilinogen, UA: 0.2 U/dL
pH, UA: 6 (ref 5.0–8.0)

## 2023-09-26 NOTE — Assessment & Plan Note (Signed)
 Acute onset after eating large amount of sweets.  Burning on plantar surfaces, symmetric both feet.  No sensation of numbness.  She is not diabetic (she thinks she was tested again yesterday at [facility] and will request that those labs to be sent to us ).  Exam with no abnormal findings of skin or joints.  DP pulses 1+.  Sensation intact to monofilament testing.  Vibratory sense and position sense are intact bilaterally.  Strong ankle dorsiflexion and plantar flexion.  This presentation would be atypical for a B12 deficiency, and she has no signs or symptoms of hypothyroidism.  She does not drink alcohol.  No obvious etiology.  Will monitor.

## 2023-09-26 NOTE — Assessment & Plan Note (Signed)
 Overdue follow-up appointment with urology scheduled for later this month.  Cystoscopy is planned.  She equates urine odor with infection and it sounds as though she may have been treated for same UTIs on this basis at [facility].  Urinalysis today is negative.  Feels aching pain in the right flank, no dysuria, and has not noticed recent gross hematuria.

## 2023-09-26 NOTE — Assessment & Plan Note (Signed)
 Coronary CT on 08/10/2023: Calcium score 93, 88th percentile for age, sex.  Dr. Lavona noted mid LAD plaque less than 50% stenosed with mild plaque elsewhere.  No anginal symptoms.  She had sometime ago stopped her aspirin  as she does not like taking medications.  Her cholesterol is very high, she is currently eating foods such as bacon (for the salt, to keep my blood pressure up), and recommits, as she has many times in the past, to improving diet and lifestyle.

## 2023-09-26 NOTE — Patient Instructions (Signed)
 Ms. Kynnadi, Dicenso to see you again!  I'm glad you will be seeing the urologist soon.  We discussed your burning sensation in your feet.  I'm not sure how to explain this, but perhaps the blood tests done at [facility] yesterday will shed some light.  Thank you for checking if Heather can send us  your blood test results from yesterday.    Take care and stay well!  I'll hold onto your FL2 form until I hear back from you.  Dr. Trudy

## 2023-09-26 NOTE — Assessment & Plan Note (Signed)
 Most recent lipid panel in our system is 08/2022, TC 236 and LDL 163.  Does not wish to consider medication (dislikes medication generally) and does not see the correlation with cholesterol and CAD, nor with aspirin  and CAD secondary prevention.  She indicated she would be having her lipids rechecked at a cardiology follow-up.  She also has labs done at [facility].

## 2023-09-26 NOTE — Assessment & Plan Note (Signed)
 She is currently seeing a behavioral health therapist organized through her gynecologist.  Declines pharmacotherapy.  She has significant stressors in her personal life.

## 2023-09-27 ENCOUNTER — Telehealth: Payer: Self-pay | Admitting: *Deleted

## 2023-09-27 DIAGNOSIS — E559 Vitamin D deficiency, unspecified: Secondary | ICD-10-CM | POA: Diagnosis not present

## 2023-09-27 DIAGNOSIS — E782 Mixed hyperlipidemia: Secondary | ICD-10-CM | POA: Diagnosis not present

## 2023-09-27 DIAGNOSIS — R7303 Prediabetes: Secondary | ICD-10-CM | POA: Diagnosis not present

## 2023-09-27 NOTE — Telephone Encounter (Signed)
 Pt's here at the office - stated she saw Dr Trudy yesterday. And [name] suppose to be faxing over test results about her kidneys. Front office faxed the ROI form to [place] this am and received confirmation - this was told to the pt. I also informed pt we might receive her records today, but mostly likely next week. Pt stated she will wait (in the lobby) until we close to see if her records come.

## 2023-09-27 NOTE — Telephone Encounter (Signed)
 Pt has left the office.

## 2023-09-27 NOTE — Telephone Encounter (Signed)
 No we do not have her records yet.

## 2023-09-30 ENCOUNTER — Telehealth: Payer: Self-pay

## 2023-09-30 NOTE — Telephone Encounter (Signed)
 Pt  called to send a heartfelt thank you to her PCP  for doing her part with her  .Aaron Aas

## 2023-09-30 NOTE — Telephone Encounter (Signed)
 Pt's here - stated she picked up the lab results from [facility] herself which was given to Dr Broadus Canes. Pt stated she will wait on paperwork to be completed - Dr Broadus Canes made aware.Aaron Aas

## 2023-10-15 DIAGNOSIS — R3121 Asymptomatic microscopic hematuria: Secondary | ICD-10-CM | POA: Diagnosis not present

## 2023-12-30 ENCOUNTER — Telehealth: Payer: Self-pay | Admitting: Internal Medicine

## 2023-12-30 ENCOUNTER — Telehealth: Payer: Self-pay | Admitting: *Deleted

## 2023-12-30 NOTE — Telephone Encounter (Signed)
 Patient called requesting the name and phone number for the dermatologist at [facility] in [place] that Dr. Rodell Citrin referred her to.

## 2023-12-30 NOTE — Telephone Encounter (Signed)
 I called patient with provider name and phone number, Dr. Jorizzo.

## 2023-12-30 NOTE — Telephone Encounter (Signed)
 RTC to [facility] patient's insurance concerning patient being on Statins.  Patient in note stated tat she refuses to start Statins.

## 2024-01-22 NOTE — Progress Notes (Signed)
 Office Visit Note  Patient: Nicole [name]             Date of Birth: [birth year]           MRN: [id number]             PCP: Trudy Mliss Dragon, MD Referring: Trudy Mliss Dragon, MD Visit Date: 02/05/2024   Subjective:  Follow-up   Discussed the use of AI scribe software for clinical note transcription with the patient, who gave verbal consent to proceed.  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 Nicole [name] is a 63 y.o. female here for follow up with ongoing joint pain particularly bothering her at the shoulders.   She has a history of shoulder pain, primarily on the right side, which was previously severe but has since improved. The left shoulder pain has also improved. She manages the pain with self-healing methods, anti-inflammatory foods, and occasionally uses topical treatments and heat.  She has been diagnosed with localized scleroderma, also known as morphea, which has caused skin changes including hair loss and indentations on her legs.  She saw Dr. Jorizzo in [place] and subsequently Greig Blower in [place] as specialists for the scleroderma.  But she was never able to follow-up since initial visit in February 2024.  She experiences redness and occasional pain in the tips of her fingers, which she associates with a past COVID-19 vaccine. The redness sometimes turns pale and cold, and she has developed rashes on her fingers. She does not take blood pressure medication and reports having low blood pressure. She states concern because her father had similar symptoms and eventually had his hands and feet amputated.     Previous HPI 11/29/2022 Nicole [name] is a 63 y.o. female here for follow up with ongoing joint pain particularly bothering her at the shoulders.  Since her last visit right shoulder pain is doing better but her left shoulder is currently causing  more problems.  Gets pain with lifting and pulling activities worst across the top of the shoulder area.  She did not end up using the topical Voltaren  at all because she was not very confident about the correct way to do so and prefers avoiding medications is much as possible.  Otherwise she is not experiencing any radiation of symptoms no increased joint pain or swelling elsewhere.  She was scheduled for bone density testing in August of this year.     Previous HPI 10/17/22 Nicole [name] is a 63 y.o. female here for follow up for the previous findings of morphea and also with joint pain in several areas most very currently in the right shoulder.  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  She has been recommended to use compression socks to minimize venous stasis but not on systemic medical therapy.  Otherwise has been doing pretty well overall though she has noticed some overall worsening with her energy and health since decreased physical activity.  She has been working on increasing this again but has right shoulder pain this initially started while walking.  Now it is becoming more painful when she tries to reach across the body and it somewhat alleviated with reaching overhead.  Symptoms are not radiating anywhere she has no known  history of serious injury to the shoulder. She is also concerned about bone density and interested in screening for osteoporosis.   Previous HPI 01/01/22 Nicole [name] is a 63 y.o. female here for morphea evaluation for possible systemic sclerosis. She has been diagnosed by dermatology biopsy of affected area on her leg. She has skin rashes in multiple areas she estimates for about 20 years in total not sure about when these have progressed to become more noticeable.  Is associated with loss of hair as well worst in  bilateral temporal areas and in her distal legs.  She has a few hyperpigmented skin patches and some areas of thickening or indentations.  Some hypopigmented areas as well but smaller spots and this is also been a scattered distribution including her torso.  She has noticed tightness or catching and tendons on the top of her feet that comes and goes feels like this takes several minutes until release and get back to normal range of movement.  She notices some swelling intermittently in her hands worse when walking and standing for prolonged time.  She saw pulmonology for assessment with no significant CT abnormalities. GI workup is planned for evaluating esophageal dysmotility. Concern is also for possible esophageal stricture or scarring related to chronic GERD.    Review of Systems  Constitutional:  Positive for fatigue.  HENT:  Positive for mouth sores and mouth dryness.   Eyes:  Positive for dryness.  Respiratory:  Positive for shortness of breath.   Cardiovascular:  Positive for chest pain. Negative for palpitations.  Gastrointestinal:  Positive for constipation. Negative for blood in stool and diarrhea.  Endocrine: Negative for increased urination.  Genitourinary:  Negative for involuntary urination.  Musculoskeletal:  Positive for gait problem, muscle weakness and morning stiffness. Negative for joint pain, joint pain, joint swelling, myalgias, muscle tenderness and myalgias.  Skin:  Positive for hair loss and sensitivity to sunlight. Negative for color change and rash.  Allergic/Immunologic: Positive for susceptible to infections.  Neurological:  Positive for headaches. Negative for dizziness.  Hematological:  Positive for swollen glands.  Psychiatric/Behavioral:  Positive for depressed mood and sleep disturbance. The patient is nervous/anxious.     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 01/18/2022   Sensation of food items getting stuck in upper esophagus to be evaluated first by esophagram and then by EGD with Dr. Avram over the next few weeks.   Hypercholesterolemia    Morphea 2022   Nodule of skin of breast 01/04/2021   Obesity    OSA (obstructive sleep apnea) 12/27/2015   Palpitations    negative cardiac w/u per Dr. Edith 2/09   Reflux esophagitis    Sleep apnea    No CPAP   Vaginal discharge 03/02/2020    Family History  Problem Relation Age of Onset   Hypertension Mother    Diabetes Mother    Heart disease Mother     Thyroid  disease Mother    Glaucoma Mother    Diabetes Father    Hypertension Sister    Diabetes Sister    Diabetes Sister    Hypertension Sister    Sleep apnea Sister    Hypertension Sister    Diabetes Sister    Hypertension Brother    Diabetes Brother    Pancreatic cancer Maternal Uncle    Heart attack Maternal Grandmother    Heart attack Maternal Grandfather    Mental illness Son    Mental illness Son    Pancreatic cancer Other        family history   Colon cancer Neg Hx    Past Surgical History:  Procedure Laterality Date   COLONOSCOPY     ECTOPIC PREGNANCY SURGERY     EXPLORATORY LAPAROTOMY  2000   x2 (Dr. Shanda Muscat)   HERPES SIMPLEX VIRUS DFA     TUBAL LIGATION  1987   Social History   Social History Narrative   Current Social History 02/16/2021        Patient lives alone in a [employer] which is 2 stories. There are 13 steps with handrails up to the entrance the patient uses.       Patient's method of transportation is city bus.      The highest level of education was high school diploma.      The patient currently disabled.      Identified important Relationships are My mother, my sisters, children, grandchildren (5)       Pets : None       Interests / Fun: Read, watch TV, movies, social media, talk on phone, shopping, travel       Current Stressors: Simple people get on my nerves,;drama.       Religious / Personal Beliefs: Christian, [place], Pentecostal.       Other: I stay away from people.      FREDRIK Alcide, BSN, RN-BC              Immunization History  Administered Date(s) Administered   Td 10/18/2005   Tdap 10/27/2012     Objective: Vital Signs: BP 94/63 (BP Location: Left Arm, Patient Position: Sitting, Cuff Size: Large)   Pulse 77   Resp 14   Ht 5' 5 (1.651 m)   Wt 166 lb (75.3 kg)   LMP 03/03/2016 (Approximate)   BMI 27.62 kg/m    Physical Exam  Eyes:     Conjunctiva/sclera: Conjunctivae normal.     Cardiovascular:     Rate and Rhythm: Normal rate and regular rhythm.  Pulmonary:     Effort: Pulmonary effort  is normal.     Breath sounds: Normal breath sounds.   Musculoskeletal:     Right lower leg: No edema.     Left lower leg: No edema.  Lymphadenopathy:     Cervical: No cervical adenopathy.   Skin:    General: Skin is warm and dry.   Neurological:     Mental Status: She is alert.   Psychiatric:        Mood and Affect: Mood normal.      Musculoskeletal Exam:  Right shoulder pain with active movement especially shrugging and reaching overhead, AC joint tenderness to pressure without palpable swelling or instability Elbows full ROM no tenderness or swelling Wrists full ROM no tenderness or swelling Fingers full ROM no tenderness or swelling Knees full ROM no tenderness or swelling Ankles full ROM no tenderness or swelling    Investigation: No additional findings.  Imaging: No results found.  Recent Labs: Lab Results  Component Value Date   WBC 4.5 06/17/2023   HGB 12.2 06/17/2023   PLT 222 06/17/2023   NA 140 06/17/2023   K 3.6 06/17/2023   CL 106 06/17/2023   CO2 25 06/17/2023   GLUCOSE 122 (H) 06/17/2023   BUN 14 06/17/2023   CREATININE 0.86 06/17/2023   BILITOT 0.6 05/02/2023   ALKPHOS 57 05/02/2023   AST 20 05/02/2023   ALT 30 05/02/2023   PROT 6.8 05/02/2023   ALBUMIN 4.1 05/02/2023   CALCIUM 9.7 06/17/2023   GFRAA 85 09/25/2019   QFTBGOLD Negative 03/14/2017    Speciality Comments: No specialty comments available.  Procedures:  No procedures performed Allergies: Other   Assessment / Plan:     Visit Diagnoses: Injury of right acromioclavicular joint, sequela AC joint pain Intermittent AC joint pain, particularly on the right side, with improvement over time. Pain is provoked by certain shoulder movements. - Recommended exercises to strengthen muscles around the [facility] joint to alleviate stress on the joint.  - Discussed options of  local steroid injection or physical therapy referral which she defers at this time    Localized scleroderma (morphea) Localized scleroderma with skin changes, including hair loss and indentation on the legs. No systemic sclerosis. Raynaud's syndrome symptoms present, common in scleroderma. - Provided information about Raynaud's syndrome and its association with scleroderma. - Monitor for signs of worsening circulation, such as pitting or blistering on fingertips. - Recommend she call to make follow-up appointment with Dr. Mollie as she should still be a current patient 16 months since last visit for skin and hair evaluation.  Raynaud's syndrome Symptoms include red and sometimes pale fingertips, possibly related to peripheral neuropathy. No current need for medication as symptoms are mild. Explained that 98% of scleroderma patients experience Raynaud's symptoms. - Provided information about Raynaud's syndrome and lifestyle modifications to prevent exacerbation. - Monitor symptoms, especially during winter, to assess if further intervention is needed.  Osteoporosis Osteoporosis diagnosed based on bone density test. She has not yet started Fosamax but acknowledges the need for it. Explained that without medication, bone density tends to decline over time, increasing fracture risk. - Encourage starting Fosamax once weekly for osteoporosis management which was previously prescribed. - Discussed the importance of medication adherence to prevent fractures. - Educated on the limitations of calcium and vitamin D  alone in improving bone density.   Orders: No orders of the defined types were placed in this encounter.  No orders of the defined types were placed in this encounter.    Follow-Up Instructions: Return  in about 6 months (around 08/06/2024) for Morphea/AC arthritis/?raynaud f/u 6mos.   Lonni LELON Ester, MD  Note - This record has been created using [employer].  Chart  creation errors have been sought, but may not always  have been located. Such creation errors do not reflect on  the standard of medical care.

## 2024-02-05 ENCOUNTER — Encounter: Payer: Self-pay | Admitting: Internal Medicine

## 2024-02-05 ENCOUNTER — Ambulatory Visit: Attending: Internal Medicine | Admitting: Internal Medicine

## 2024-02-05 VITALS — BP 94/63 | HR 77 | Resp 14 | Ht 65.0 in | Wt 166.0 lb

## 2024-02-05 DIAGNOSIS — M25512 Pain in left shoulder: Secondary | ICD-10-CM

## 2024-02-05 DIAGNOSIS — R937 Abnormal findings on diagnostic imaging of other parts of musculoskeletal system: Secondary | ICD-10-CM | POA: Diagnosis not present

## 2024-02-05 DIAGNOSIS — L940 Localized scleroderma [morphea]: Secondary | ICD-10-CM

## 2024-02-05 DIAGNOSIS — G8929 Other chronic pain: Secondary | ICD-10-CM | POA: Diagnosis not present

## 2024-02-05 DIAGNOSIS — S4991XD Unspecified injury of right shoulder and upper arm, subsequent encounter: Secondary | ICD-10-CM | POA: Diagnosis not present

## 2024-02-05 DIAGNOSIS — M810 Age-related osteoporosis without current pathological fracture: Secondary | ICD-10-CM

## 2024-02-05 DIAGNOSIS — S4991XS Unspecified injury of right shoulder and upper arm, sequela: Secondary | ICD-10-CM

## 2024-02-05 NOTE — Patient Instructions (Signed)
 You previously saw Dr. Darlen Eglin in February 2024. She is a good expert, you can try to contact their office since you should still be an active patient seeing her within 2 years.   Stretching and range-of-motion exercises These exercises warm up your muscles and joints and improve the movement and flexibility of your shoulder. The exercises also help to relieve pain and stiffness. Shoulder flexion, seated This is a form of shoulder exercise in which you raise an arm in front of your body until you feel a stretch in your injured shoulder. Sit in a stable chair and rest your left / right forearm on a flat surface. Your elbow should rest at a height that keeps your upper arm next to your body. Keeping your left / right shoulder relaxed, lean forward at the waist and slide your hand forward until you feel a stretch in your shoulder (flexion). You can move your chair farther away from the surface to increase the stretch by bending forward more, if needed. Hold for __________ seconds. Slowly return to the starting position. Repeat __________ times. Complete this exercise __________ times a day. Strengthening exercises These exercises build strength and endurance in your shoulder. Endurance is the ability to use your muscles for a long time, even after they get tired. Scapular retraction In this exercise, the shoulder blades (scapulae) are pulled toward each other and toward the spine. Sit in a stable chair without armrests, or stand up. Secure an exercise band to a stable object in front of you so the band is at shoulder height. Hold one end of the exercise band in each hand. Squeeze your shoulder blades together (scapular retraction) and move your elbows slightly behind you. Do not shrug your shoulders upward while you do this. Hold for __________ seconds. Slowly return to the starting position. Repeat __________ times. Complete this exercise __________ times a day. Shoulder abduction In  this exercise, you raise your arm and shoulder so that they move away from the center of the body (abduction), toward the outside. Sit in a stable chair without armrests, or stand up. If directed, hold a __________ lb / kg weight in your left / right hand. Start with your arms straight down. Turn your left / right hand so your palm faces in, toward your body. Slowly lift your left / right hand out to your side. Do not lift your hand above shoulder height. Keep your arms straight. Avoid shrugging your shoulder upward while you do this movement. Keep your shoulder blade tucked down toward the middle of your back. Hold for __________ seconds. Slowly lower your arm and return to the starting position. Repeat __________ times. Complete this exercise __________ times a day. Scapular protraction, standing In this exercise, you move the shoulder blades away from each other, and away from the spine (protraction). This is the opposite of retraction. Stand so you are facing a wall. Place your feet about one arm-length away from the wall. Place your hands on the wall in front of you and straighten your elbows. Keep your hands on the wall as you push your upper back away from the wall. You should feel your shoulder blades sliding forward around your rib cage. Keep your elbows and your head still. Hold for __________ seconds. Slowly return to the starting position. Let your muscles relax completely before you repeat this exercise. Repeat __________ times. Complete this exercise __________ times a day. This information is not intended to replace advice given to you by your  health care provider. Make sure you discuss any questions you have with your health care provider.

## 2024-02-10 ENCOUNTER — Ambulatory Visit: Payer: Self-pay | Admitting: Internal Medicine

## 2024-02-10 NOTE — Telephone Encounter (Signed)
 FYI Only or Action Required?: FYI only for provider.  Patient was last seen in primary care on 09/26/2023 by Trudy Mliss Dragon, MD. Called Nurse Triage reporting Eye Strain. Symptoms began several months ago. Symptoms are: unchanged.  Triage Disposition: See Physician Within 24 Hours (overriding See PCP Within 2 Weeks)  Patient/caregiver understands and will follow disposition?: Yes                            Copied from CRM [id number]. Topic: Clinical - Red Word Triage >> Feb 10, 2024  8:25 AM Carmell SAUNDERS wrote: Red Word that prompted transfer to Nurse Triage: Right eye blood vessel burst. Pt says she smoked some marijuana and got angry with someone and then saw that it burst.  Also patient is supposed to capture any worms that pass in her stool but has not, but did capture something black that came out of her urine. She captured it 2 months ago and since then it has grew. She did pass a few yesterday and did not capture them. Pt says she feels stuff crawling inside of her, underneath her skin, and around her rectum. Feels it in her eyes too. Wants to make an appointment asap. Reason for Disposition  All other urine symptoms  Answer Assessment - Initial Assessment Questions Patient scheduled for first available appointment in office tomorrow, 6/24, with a different provider as no availability with PCP in office. Patient states she will bring her black bug specimen. This RN educated pt on new-worsening symptoms and when to call back/seek emergent care. Pt verbalized understanding and agrees to plan.          Patient states yesterday a blood vessel broke in her right eye; pt called paramedics and educated her on what symptoms to look out for to go to ED  Pt states a few months ago she let Dr. Trudy know she was passing parasites. Pt states she was given a specimen cup to catch these worms. Pt states when she peed she got a black something that was moving. Pt  kept it in the specimen jar and put some water in it. Pt states she passed a few of these black things yesterday. Pt went to get her specimen cup and the small black thing that was the size of a pencil head is now the size of a tick. Pt states its not a worm but is some type of bug.   Black bugs in urine noticed 3 months ago- intermittent, not every time pt urinates Worms in poop noticed 5 months ago  Denies pain with urination or bowel movements, fever, bug bites  Itching around naval and can feel them crawling under skin  Pt states her pelvis hurts   Pt has an appt with urologist on Wednesday. Pt wants to bring specimen to office.  Protocols used: Urinary Symptoms-A-AH

## 2024-02-11 ENCOUNTER — Ambulatory Visit (INDEPENDENT_AMBULATORY_CARE_PROVIDER_SITE_OTHER): Admitting: Student

## 2024-02-11 VITALS — BP 121/80 | HR 73 | Wt 174.8 lb

## 2024-02-11 DIAGNOSIS — R399 Unspecified symptoms and signs involving the genitourinary system: Secondary | ICD-10-CM

## 2024-02-11 DIAGNOSIS — B839 Helminthiasis, unspecified: Secondary | ICD-10-CM

## 2024-02-11 DIAGNOSIS — Z1231 Encounter for screening mammogram for malignant neoplasm of breast: Secondary | ICD-10-CM

## 2024-02-11 DIAGNOSIS — R319 Hematuria, unspecified: Secondary | ICD-10-CM

## 2024-02-11 LAB — POCT URINALYSIS DIPSTICK
Bilirubin, UA: NEGATIVE
Glucose, UA: NEGATIVE
Ketones, UA: NEGATIVE
Leukocytes, UA: NEGATIVE
Nitrite, UA: NEGATIVE
Protein, UA: NEGATIVE
Spec Grav, UA: >=1.03 — AB (ref 1.010–1.025)
Urobilinogen, UA: 0.2 U/dL
pH, UA: 5.5 (ref 5.0–8.0)

## 2024-02-11 NOTE — Assessment & Plan Note (Addendum)
 Presents today with persistent concerns of passing parasites in her urine over the last several months.  Notes that this occurs about every 2 weeks and does not have any associated symptoms including dysuria, hematuria, suprapubic pain, increased urinary frequency, or fevers.  She does have some seemingly unrelated left groin pain that is not consistent with kidney stone pain or PID.  Her urine dipstick today was positive for blood but otherwise normal.  She has a follow-up with urology tomorrow. - Send urine for formal UA with microscopy and reflex culture - Follow-up with urology

## 2024-02-11 NOTE — Assessment & Plan Note (Addendum)
 Patient reports persistent concerns of passing worms in her stool.  She notes intermittent rectal itching but denies any dark stools or bloody stools.  She also describes feeling things crawling inside her abdomen and is concerned about parasites or worms.  She has some intermittent left groin pain but otherwise is not having any abdominal pain, nausea, vomiting, or other associated symptoms.  She has lost about 10 pounds over the last few months but has been trying to lose weight and denies any B symptoms.  She is overdue for a mammogram but is otherwise up-to-date on age-appropriate cancer screening.  She is deferring her mammogram until an issue with some blood vessel in her breast is resolved. - Order for stool parasites and ova

## 2024-02-11 NOTE — Progress Notes (Signed)
 Internal Medicine Clinic Attending  Case discussed with the resident at the time of the visit.  We reviewed the resident's history and exam and pertinent patient test results.  I agree with the assessment, diagnosis, and plan of care documented in the resident's note.

## 2024-02-11 NOTE — Patient Instructions (Addendum)
 Thank you, Ms.Glenys HERO Hollifield, for allowing us  to provide your care today. Today we discussed . . .  > Urinary symptoms       - Your urine sample today did not show any significant signs of infection going on right now but there is a small amount of blood so we can send it off for a formal urinalysis with microscopy.  I think following up with urology and getting their input will be very important to understand what is causing your symptoms. > Blood vessel in your eye       - I do not think that there is anything immediately dangerous going on with your eye which is good but I do think getting seen by an eye doctor in the next couple weeks will be good.  If you have any pain when you move your eye, change in your vision, or other concerning symptoms before then please do not hesitate to give us  a call.  I will send an order for a stool test as well so you are able to bring in a stool sample before your next visit please do this.  We will see you in about 2 to 3 months but please let us  know if there is anything we can do before then.  I will also send a referral for mammogram today since you are overdue for your annual breast cancer screening.  I have ordered the following labs for you:  Lab Orders         Ova and parasite examination         UA/M w/rflx Culture, Routine         POCT Urinalysis Dipstick ([id number])       Follow up: 3 months    Remember:  Should you have any questions or concerns please call the internal medicine clinic at [phone].     Fairy Pool, DO [facility]

## 2024-02-11 NOTE — Progress Notes (Signed)
   CC: Acute Concern of parasites in urine  HPI:  Nicole [name] is a 63 y.o. 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 Please see assessment and plan below for further details.  Medications: Current Outpatient Medications  Medication Instructions   alendronate (FOSAMAX) 70 mg, Weekly     Review of Systems:   Pertinent items noted in HPI and/or A&P.  Physical Exam:  Vitals:   02/11/24 0826  BP: 121/80  Pulse: 73  SpO2: 93%  Weight: 174 lb 12.8 oz (79.3 kg)    Constitutional: Anxious but well-appearing middle-aged female. In no acute distress. HEENT: Normocephalic, atraumatic, Sclera non-icteric with mild subconjunctival hemorrhage along the lateral inferior border of the right iris, PERRL, EOM intact without pain Cardio:Regular rate and rhythm. 2+ bilateral radial pulses. Pulm:Normal work of breathing on room air. Abdomen: Soft, non-tender, non-distended, positive bowel sounds. FDX:Wzhjupcz for extremity edema. Skin:Warm and dry. Neuro:Alert and oriented x3. No focal deficit noted.   Assessment & Plan:   UTI symptoms Presents today with persistent concerns of passing parasites in her urine over the last several months.  Notes that this occurs about every 2 weeks and does not have any associated symptoms including dysuria, hematuria, suprapubic pain, increased urinary frequency, or fevers.  She does have some seemingly unrelated left groin pain that is not consistent with kidney stone pain or PID.  Her urine dipstick today was positive for blood but otherwise normal.  She has a follow-up with urology tomorrow. - Send urine for formal UA with microscopy and reflex culture - Follow-up with urology  Worms in stool Patient reports persistent concerns of passing worms in her stool.  She notes intermittent rectal itching but denies  any dark stools or bloody stools.  She also describes feeling things crawling inside her abdomen and is concerned about parasites or worms.  She has some intermittent left groin pain but otherwise is not having any abdominal pain, nausea, vomiting, or other associated symptoms.  She has lost about 10 pounds over the last few months but has been trying to lose weight and denies any B symptoms.  She is overdue for a mammogram but is otherwise up-to-date on age-appropriate cancer screening.  She is deferring her mammogram until an issue with some blood vessel in her breast is resolved. - Order for stool parasites and ova    Patient discussed with Dr. Layman Bee  Fairy Pool, DO [facility] Internal Medicine Resident PGY-2 Clinic Phone: [phone] Please contact the on call pager at [phone] for any urgent or emergent needs.

## 2024-02-12 ENCOUNTER — Ambulatory Visit: Payer: Self-pay

## 2024-02-12 DIAGNOSIS — R3121 Asymptomatic microscopic hematuria: Secondary | ICD-10-CM | POA: Diagnosis not present

## 2024-02-12 LAB — UA/M W/RFLX CULTURE, ROUTINE
Bilirubin, UA: NEGATIVE
Glucose, UA: NEGATIVE
Ketones, UA: NEGATIVE
Leukocytes,UA: NEGATIVE
Nitrite, UA: NEGATIVE
Protein,UA: NEGATIVE
RBC, UA: NEGATIVE
Specific Gravity, UA: >=1.03 — AB (ref 1.005–1.030)
Urobilinogen, Ur: 0.2 mg/dL (ref 0.2–1.0)
pH, UA: 5.5 (ref 5.0–7.5)

## 2024-02-12 LAB — MICROSCOPIC EXAMINATION
Bacteria, UA: NONE SEEN
Casts: NONE SEEN /LPF
WBC, UA: NONE SEEN /HPF (ref 0–5)

## 2024-02-12 NOTE — Telephone Encounter (Signed)
 NFN. Pt went to the E.D. Pulmonary does not treat sore throats. This is u/c or pcp.

## 2024-02-12 NOTE — Telephone Encounter (Signed)
 Copied from CRM [id number]. Topic: Clinical - Red Word Triage >> Feb 12, 2024 11:50 AM Russell PARAS wrote: Red Word that prompted transfer to Nurse Triage:   Pt reports she smoked cigarette yesterday, which she has not done in awhile Had sore throat after smoking Gargled salt water and using peppermint Throat is still very irritated Excessive phlegm present No fever, headache   FYI Only or Action Required?: FYI only for provider.  Last seen on 05/21/2022 by Mannam, Praveen, MD. Called Nurse Triage reporting Sore Throat. Symptoms began yesterday. Interventions attempted: Other: salt water gargle and peppermint. Symptoms are: gradually worsening.  Triage Disposition: Home Care  Patient/caregiver understands and will follow disposition?: No, refuses disposition   Patient advised of next available appointment and stated she would just go to the ED and ended the call.    Reason for Disposition  [1] Sore throat is the only symptom AND [2] sore throat present < 48 hours  Answer Assessment - Initial Assessment Questions 1. ONSET: When did the throat start hurting? (Hours or days ago)      Yesterday  2. SEVERITY: How bad is the sore throat? (Scale 1-10; mild, moderate or severe)   - MILD (1-3):  Doesn't interfere with eating or normal activities.   - MODERATE (4-7): Interferes with eating some solids and normal activities.   - SEVERE (8-10):  Excruciating pain, interferes with most normal activities.   - SEVERE WITH DYSPHAGIA (10): Can't swallow liquids, drooling.     Mild to moderate  3. STREP EXPOSURE: Has there been any exposure to strep within the past week? If Yes, ask: What type of contact occurred?      No 4.  VIRAL SYMPTOMS: Are there any symptoms of a cold, such as a runny nose, cough, hoarse voice or red eyes?      No 5. FEVER: Do you have a fever? If Yes, ask: What is your temperature, how was it measured, and when did it start?     No 6. PUS ON THE TONSILS: Is  there pus on the tonsils in the back of your throat?     No 7. OTHER SYMPTOMS: Do you have any other symptoms? (e.g., difficulty breathing, headache, rash)     No  Protocols used: Sore Throat-A-AH

## 2024-02-17 ENCOUNTER — Ambulatory Visit: Admitting: Student

## 2024-02-17 ENCOUNTER — Other Ambulatory Visit: Payer: Self-pay

## 2024-02-17 ENCOUNTER — Encounter: Payer: Self-pay | Admitting: Student

## 2024-02-17 ENCOUNTER — Ambulatory Visit: Payer: Self-pay

## 2024-02-17 VITALS — BP 111/66 | HR 71 | Temp 97.6°F | Wt 172.8 lb

## 2024-02-17 DIAGNOSIS — J069 Acute upper respiratory infection, unspecified: Secondary | ICD-10-CM | POA: Diagnosis not present

## 2024-02-17 DIAGNOSIS — U071 COVID-19: Secondary | ICD-10-CM

## 2024-02-17 NOTE — Patient Instructions (Signed)
 Thank you so much for coming to the clinic today!   I will give you a call to follow up the results! Glad you are feeling better!  If you have any questions please feel free to the call the clinic at anytime at [phone]. It was a pleasure seeing you!  Best, Dr. Mayuri Staples

## 2024-02-17 NOTE — Telephone Encounter (Signed)
 FYI Only or Action Required?: FYI only for provider.  Patient was last seen in primary care on 02/11/2024 by Jolaine Pac, DO. Called Nurse Triage reporting Virus. Symptoms began several days ago. Interventions attempted: Nothing. Symptoms are: gradually worsening.  Triage Disposition: See HCP Within 4 Hours (Or PCP Triage)  Patient/caregiver understands and will follow disposition?: Yes      Copied from CRM [id number]. Topic: Clinical - Red Word Triage >> Feb 17, 2024  8:07 AM Susanna ORN wrote: Red Word that prompted transfer to Nurse Triage: Patient states the ambulance came to her home over the weekend to take her to hospital but she declined since vitals were good. She's wanting to see a doctor today for stomach virus. States she currently has no fever but had a low grade fever a few days ago. Reason for Disposition  Nursing judgment or information in reference  Answer Assessment - Initial Assessment Questions Patient called in to make an appt after being seen by EMS and told to follow up with PCP asap about potential covid and/or virus. Patient states she is having random sweating episodes, prior sore throat, and headache. She was feeling short of breath when she called the ambulance but vitals were stable. Patient wanted to confirm and take the first available appt this morning. Patient states to inform office she will be calling [facility] and will be there asap.    Denies chest pain, vomiting, arm/neck/jaw pain, shortness of breath  Protocols used: No Guideline Available-A-AH

## 2024-02-17 NOTE — Progress Notes (Signed)
 CC: URTI  HPI:  [name] is a 63 y.o. female living with a history stated below and presents today for an upper respiratory tract infection. Please see problem based assessment and plan for additional details.  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 01/18/2022   Sensation of food items getting stuck in upper esophagus to be evaluated first by esophagram and then by EGD with Dr. Avram over the next few weeks.   Hypercholesterolemia    Morphea 2022   Nodule of skin of breast 01/04/2021   Obesity    OSA (obstructive sleep apnea) 12/27/2015   Palpitations    negative cardiac w/u per Dr. Edith 2/09   Reflux esophagitis    Sleep apnea    No CPAP   Vaginal discharge 03/02/2020    Current Outpatient Medications on File Prior to Visit  Medication Sig Dispense Refill   alendronate (FOSAMAX) 70 MG tablet Take 70 mg by mouth once a week. (Patient not taking: Reported on 02/05/2024)     No current facility-administered medications on file prior to visit.    Family History  Problem Relation Age of Onset   Hypertension Mother    Diabetes Mother    Heart disease Mother    Thyroid  disease Mother    Glaucoma Mother    Diabetes Father    Hypertension Sister    Diabetes Sister    Diabetes Sister    Hypertension Sister    Sleep apnea Sister    Hypertension Sister    Diabetes Sister    Hypertension Brother    Diabetes Brother    Pancreatic cancer Maternal Uncle    Heart attack Maternal Grandmother    Heart attack Maternal Grandfather    Mental illness Son    Mental illness Son    Pancreatic cancer Other        family history   Colon cancer Neg Hx     Social History    Socioeconomic History   Marital status: Single    Spouse name: Not on file   Number of children: 3   Years of education: Not on file   Highest education level: Not on file  Occupational History   Not on file  Tobacco Use   Smoking status: Former    Current packs/day: 0.00    Average packs/day: 0.3 packs/day for 36.0 years (9.0 ttl pk-yrs)    Types: Cigarettes    Start date: 05/18/1986    Quit date: 05/18/2022    Years since quitting: 1.7   Smokeless tobacco: Never  Vaping Use   Vaping status: Never Used  Substance and Sexual Activity   Alcohol use: Yes    Comment: rarely   Drug use: Yes    Types: Marijuana    Comment: daily, smokes it   Sexual activity: Never  Other Topics Concern   Not on file  Social History Narrative   Current Social History 02/16/2021        Patient lives alone in a [place] which is 2 stories. There are 13 steps with handrails up to the entrance the patient uses.       Patient's method  of transportation is city bus.      The highest level of education was high school diploma.      The patient currently disabled.      Identified important Relationships are My mother, my sisters, children, grandchildren (5)       Pets : None       Interests / Fun: Read, watch TV, movies, social media, talk on phone, shopping, travel       Current Stressors: Simple people get on my nerves,;drama.       Religious / Personal Beliefs: Christian, [place], Pentecostal.       Other: I stay away from people.      Nicole [name], BSN, RN-BC              Social Drivers of Health   Financial Resource Strain: Low Risk  (09/11/2023)   Overall Financial Resource Strain (CARDIA)    Difficulty of Paying Living Expenses: Not very hard  Food Insecurity: No Food Insecurity (09/11/2023)   Hunger Vital Sign    Worried About Running Out of Food in the Last Year: Never true    Ran Out of Food in the Last Year: Never true  Transportation Needs: No Transportation  Needs (09/11/2023)   PRAPARE - Administrator, Civil Service (Medical): No    Lack of Transportation (Non-Medical): No  Physical Activity: Insufficiently Active (09/11/2023)   Exercise Vital Sign    Days of Exercise per Week: 4 days    Minutes of Exercise per Session: 20 min  Stress: No Stress Concern Present (09/11/2023)   Nicole [name] of Occupational Health - Occupational Stress Questionnaire    Feeling of Stress : Only a little  Social Connections: Moderately Isolated (09/11/2023)   Social Connection and Isolation Panel    Frequency of Communication with Friends and Family: More than three times a week    Frequency of Social Gatherings with Friends and Family: More than three times a week    Attends Religious Services: 1 to 4 times per year    Active Member of [employer] or Organizations: No    Attends Banker Meetings: Never    Marital Status: Divorced  Catering manager Violence: Not At Risk (09/11/2023)   Humiliation, Afraid, Rape, and Kick questionnaire    Fear of Current or Ex-Partner: No    Emotionally Abused: No    Physically Abused: No    Sexually Abused: No    Review of Systems: ROS negative except for what is noted on the assessment and plan.  Vitals:   02/17/24 0920  BP: 111/66  Pulse: 71  Temp: 97.6 F (36.4 C)  TempSrc: Oral  SpO2: 99%  Weight: 172 lb 12.8 oz (78.4 kg)    Physical Exam: Constitutional: well-appearing female in no acute distress HENT: normocephalic atraumatic, mucous membranes moist, no pharyngeal erythema, no sinus tenderness, no nasal erythema or drainage Eyes: conjunctiva non-erythematous Neck: supple, no lymph nodes palpated Cardiovascular: regular rate and rhythm, no m/r/g Pulmonary/Chest: normal work of breathing on room air, lungs clear to auscultation bilaterally Skin: warm and dry   Assessment & Plan:   COVID-19 Patient presents for follow-up regarding upper respiratory tract infection symptoms.  She was  recently seen in our clinic on June 24, when she got back she noticed some throat irritation, nasal congestion, and mild headaches.  She took a COVID test on June 28 which was positive, however patient stated that the test was 72 to 65 years old.  She then proceeded to call emergency medical services, who came to her house took her vitals and did not take her to the hospital.  In clinic today, she is asymptomatic and is not having any nasal congestion or cough.  On my physical exam her lungs are clear to auscultation, and I do not appreciate any nasal or pharyngeal erythema.   I did discuss with her that that test was positive that she took, it likely means that she did have COVID.  However she is adamant about wanting a COVID test today, so we will do a swab for COVID today.  Reassuringly she is not symptomatic.  Plan: - COVID swab today   Patient discussed with Dr. Machen  Evann Erazo, M.D. [facility] Internal Medicine, PGY-3 Pager: [phone] Date 02/17/2024 Time 9:57 AM

## 2024-02-17 NOTE — Assessment & Plan Note (Signed)
 Patient presents for follow-up regarding upper respiratory tract infection symptoms.  She was recently seen in our clinic on June 24, when she got back she noticed some throat irritation, nasal congestion, and mild headaches.  She took a COVID test on June 28 which was positive, however patient stated that the test was 49 to 65 years old.  She then proceeded to call emergency medical services, who came to her house took her vitals and did not take her to the hospital.  In clinic today, she is asymptomatic and is not having any nasal congestion or cough.  On my physical exam her lungs are clear to auscultation, and I do not appreciate any nasal or pharyngeal erythema.   I did discuss with her that that test was positive that she took, it likely means that she did have COVID.  However she is adamant about wanting a COVID test today, so we will do a swab for COVID today.  Reassuringly she is not symptomatic.  Plan: - COVID swab today

## 2024-02-20 ENCOUNTER — Ambulatory Visit: Payer: Self-pay | Admitting: Student

## 2024-02-20 LAB — COVID-19, FLU A+B AND RSV
Influenza A, NAA: NOT DETECTED
Influenza B, NAA: NOT DETECTED
RSV, NAA: NOT DETECTED
SARS-CoV-2, NAA: DETECTED — AB

## 2024-02-28 ENCOUNTER — Ambulatory Visit: Payer: Self-pay

## 2024-02-28 NOTE — Telephone Encounter (Signed)
 FYI Only or Action Required?: FYI only for provider.  Patient is followed in Pulmonology for Shortness of breath, last seen on 05/21/2022 by Mannam, Praveen, MD.  Called Nurse Triage reporting No chief complaint on file..  Symptoms began several months ago.  Interventions attempted: Nothing.  Symptoms are: unchanged.  Triage Disposition: See PCP Within 2 Weeks  Patient/caregiver understands and will follow disposition?: Unsure   Copied from CRM [id number]. Topic: Clinical - Red Word Triage >> Feb 28, 2024  2:06 PM Rilla B wrote: Red Word that prompted transfer to Nurse Triage: Coughing up phlem, Did test positive from COVID. Patient has been in ED and wants to be seen. Currently negative, but having issues.   ----------------------------------------------------------------------- From previous Reason for Contact - Scheduling: Patient/patient representative is calling to schedule an appointment. Refer to attachments for appointment information. Reason for Disposition  Requesting regular office appointment  Protocols used: Information Only Call - No Triage-A-AH S

## 2024-03-02 NOTE — Progress Notes (Signed)
 Internal Medicine Clinic Attending  Case discussed with the resident at the time of the visit.  We reviewed the resident's history and exam and pertinent patient test results.  I agree with the assessment, diagnosis, and plan of care documented in the resident's note.

## 2024-03-03 ENCOUNTER — Telehealth: Payer: Self-pay | Admitting: Clinical

## 2024-03-03 NOTE — Telephone Encounter (Signed)
 Patient called and stated she would like to receive a call back from [facility] at this number [phone] .

## 2024-03-09 ENCOUNTER — Encounter (HOSPITAL_COMMUNITY): Payer: Self-pay | Admitting: Emergency Medicine

## 2024-03-09 ENCOUNTER — Other Ambulatory Visit: Payer: Self-pay

## 2024-03-09 ENCOUNTER — Emergency Department (HOSPITAL_COMMUNITY)
Admission: EM | Admit: 2024-03-09 | Discharge: 2024-03-10 | Disposition: A | Discharge status: Home / Self Care | Attending: Emergency Medicine | Admitting: Emergency Medicine

## 2024-03-09 DIAGNOSIS — R109 Unspecified abdominal pain: Secondary | ICD-10-CM

## 2024-03-09 DIAGNOSIS — R319 Hematuria, unspecified: Secondary | ICD-10-CM | POA: Diagnosis not present

## 2024-03-09 DIAGNOSIS — R1032 Left lower quadrant pain: Secondary | ICD-10-CM | POA: Diagnosis not present

## 2024-03-09 LAB — URINALYSIS, ROUTINE W REFLEX MICROSCOPIC
Bilirubin Urine: NEGATIVE
Glucose, UA: NEGATIVE mg/dL
Hgb urine dipstick: NEGATIVE
Ketones, ur: NEGATIVE mg/dL
Leukocytes,Ua: NEGATIVE
Nitrite: NEGATIVE
Protein, ur: NEGATIVE mg/dL
Specific Gravity, Urine: 1.021 (ref 1.005–1.030)
pH: 5 (ref 5.0–8.0)

## 2024-03-09 LAB — COMPREHENSIVE METABOLIC PANEL WITH GFR
ALT: 11 U/L (ref 0–44)
AST: 12 U/L — ABNORMAL LOW (ref 15–41)
Albumin: 4 g/dL (ref 3.5–5.0)
Alkaline Phosphatase: 56 U/L (ref 38–126)
Anion gap: 9 (ref 5–15)
BUN: 13 mg/dL (ref 8–23)
CO2: 22 mmol/L (ref 22–32)
Calcium: 8.9 mg/dL (ref 8.9–10.3)
Chloride: 107 mmol/L (ref 98–111)
Creatinine, Ser: 0.81 mg/dL (ref 0.44–1.00)
GFR, Estimated: >60 mL/min (ref >60)
Glucose, Bld: 167 mg/dL — ABNORMAL HIGH (ref 70–99)
Potassium: 3.7 mmol/L (ref 3.5–5.1)
Sodium: 138 mmol/L (ref 135–145)
Total Bilirubin: 0.9 mg/dL (ref 0.0–1.2)
Total Protein: 6.8 g/dL (ref 6.5–8.1)

## 2024-03-09 LAB — CBC
HCT: 34.9 % — ABNORMAL LOW (ref 36.0–46.0)
Hemoglobin: 11.5 g/dL — ABNORMAL LOW (ref 12.0–15.0)
MCH: 31.5 pg (ref 26.0–34.0)
MCHC: 33 g/dL (ref 30.0–36.0)
MCV: 95.6 fL (ref 80.0–100.0)
Platelets: 215 K/uL (ref 150–400)
RBC: 3.65 MIL/uL — ABNORMAL LOW (ref 3.87–5.11)
RDW: 13.3 % (ref 11.5–15.5)
WBC: 5.2 K/uL (ref 4.0–10.5)
nRBC: 0 % (ref 0.0–0.2)

## 2024-03-09 LAB — LIPASE, BLOOD: Lipase: 26 U/L (ref 11–51)

## 2024-03-09 NOTE — ED Triage Notes (Signed)
 Patient c/o abdominal pain x 1 day. Patient report worsening LLQ abdominal pain. Patient report taking PRN OTC medication without relief. Patient denies SOB and Chest pain.  Patient denies N/V/D. Patient denies fever.

## 2024-03-10 ENCOUNTER — Emergency Department (HOSPITAL_COMMUNITY)

## 2024-03-10 ENCOUNTER — Encounter (HOSPITAL_COMMUNITY): Payer: Self-pay

## 2024-03-10 DIAGNOSIS — N3289 Other specified disorders of bladder: Secondary | ICD-10-CM | POA: Diagnosis not present

## 2024-03-10 DIAGNOSIS — N281 Cyst of kidney, acquired: Secondary | ICD-10-CM | POA: Diagnosis not present

## 2024-03-10 DIAGNOSIS — K760 Fatty (change of) liver, not elsewhere classified: Secondary | ICD-10-CM | POA: Diagnosis not present

## 2024-03-10 DIAGNOSIS — R1032 Left lower quadrant pain: Secondary | ICD-10-CM | POA: Diagnosis not present

## 2024-03-10 MED ORDER — IOHEXOL 300 MG/ML  SOLN
100.0000 mL | Freq: Once | INTRAMUSCULAR | Status: AC | PRN
  Administered 2024-03-10: 100 mL via INTRAVENOUS

## 2024-03-10 MED ORDER — OXYCODONE-ACETAMINOPHEN 5-325 MG PO TABS
1.0000 | ORAL_TABLET | Freq: Three times a day (TID) | ORAL | 0 refills | Status: AC | PRN
Start: 2024-03-10 — End: 2024-03-13

## 2024-03-10 NOTE — Discharge Instructions (Addendum)
 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 If this area intermittently obstructs, this could be a source of the patient's pain  Alternatively, your pain could be due to muscle strain from recent exercise.  You should follow-up with a surgeon in the office regarding your known hernias.  Telephone number is below.  Return to the emergency department for any new or worsening symptoms of concern.

## 2024-03-10 NOTE — ED Provider Notes (Signed)
 [place] EMERGENCY DEPARTMENT AT [facility] Provider Note   CSN: [id number] Arrival date & time: 03/09/24  2124     Patient presents with: Abdominal Pain   Nicole [name] is a 63 y.o. female.    Abdominal Pain Patient presents for left flank pain.  Medical history includes anxiety, depression, anemia, HLD, OSA, GERD, bipolar disorder.  She will occasionally have very short episodes of left sided abdominal/flank pain.  Today she had a similar episode which was persistent.  Her last bowel movement was earlier today.  She has not had any nausea or vomiting.  She has not had any recent urine symptoms.  She has been told in the past that she has microscopic hematuria.  Current pain is mild.     Prior to Admission medications   Medication Sig Start Date End Date Taking? Authorizing Provider  alendronate (FOSAMAX) 70 MG tablet Take 70 mg by mouth once a week. Patient not taking: Reported on 02/05/2024 06/10/23   [provider]    Allergies: Other    Review of Systems  Gastrointestinal:  Positive for abdominal pain.  All other systems reviewed and are negative.   Updated Vital Signs BP 122/71 (BP Location: Right Arm)   Pulse 85   Temp 98 F (36.7 C) (Oral)   Resp 18   Ht 5' 5 (1.651 m)   Wt 78.4 kg   LMP 03/03/2016 (Approximate)   SpO2 97%   BMI 28.76 kg/m   Physical Exam Vitals and nursing note reviewed.  Constitutional:      General: She is not in acute distress.    Appearance: She is well-developed. She is not ill-appearing, toxic-appearing or diaphoretic.  HENT:     Head: Normocephalic and atraumatic.  Eyes:     Extraocular Movements: Extraocular movements intact.     Conjunctiva/sclera: Conjunctivae normal.  Cardiovascular:     Rate and Rhythm: Normal rate and regular rhythm.  Pulmonary:     Effort: Pulmonary effort is normal. No respiratory distress.     Breath sounds: Normal breath sounds.  Abdominal:     Palpations: Abdomen is soft.      Tenderness: There is abdominal tenderness.     Comments: Left flank tenderness  Musculoskeletal:        General: No swelling.     Cervical back: Neck supple.  Skin:    General: Skin is warm and dry.  Neurological:     General: No focal deficit present.     Mental Status: She is alert and oriented to person, place, and time.  Psychiatric:        Mood and Affect: Mood normal.        Behavior: Behavior normal.     (all labs ordered are listed, but only abnormal results are displayed) Labs Reviewed  COMPREHENSIVE METABOLIC PANEL WITH GFR - Abnormal; Notable for the following components:      Result Value   Glucose, Bld 167 (*)    AST 12 (*)    All other components within normal limits  CBC - Abnormal; Notable for the following components:   RBC 3.65 (*)    Hemoglobin 11.5 (*)    HCT 34.9 (*)    All other components within normal limits  LIPASE, BLOOD  URINALYSIS, ROUTINE W REFLEX MICROSCOPIC    EKG: None  Radiology: CT ABDOMEN PELVIS W CONTRAST Result Date: 03/10/2024 EXAM: CT ABDOMEN AND PELVIS WITH CONTRAST 03/10/2024 04:09:11 AM TECHNIQUE: CT of the abdomen and pelvis  was performed with the administration of intravenous contrast. Multiplanar reformatted images are provided for review. Automated exposure control, iterative reconstruction, and/or weight based adjustment of the mA/kV was utilized to reduce the radiation dose to as low as reasonably achievable. COMPARISON: CT abdomen and pelvis 05/02/2023 CLINICAL HISTORY: Abdominal/flank pain, stone suspected. Abdominal pain x 1 day. Patient report worsening LLQ abdominal pain, GFR>60; 100 ml omni 300; Pt did not want to bring her arms up due to hx of clots in them FINDINGS: LOWER CHEST: No acute abnormality. LIVER: Mild fatty infiltration of the liver is again noted. No discrete lesions are present. GALLBLADDER AND BILE DUCTS: Gallbladder is unremarkable. No biliary ductal dilatation. SPLEEN: No acute abnormality. PANCREAS: No  acute abnormality. ADRENAL GLANDS: No acute abnormality. KIDNEYS, URETERS AND BLADDER: An 8 mm simple cyst is present in the left kidney. Per consensus, no follow-up is needed for simple Bosniak type 1 and 2 renal cysts, unless the patient has a malignancy history or risk factors. No stones in the kidneys or ureters. No hydronephrosis. No perinephric or periureteral stranding. Urinary bladder is unremarkable. GI AND BOWEL: Several loops of small bowel are located posteriorly in the left abdomen. This pattern has been consistent over multiple exams and is consistent with an internal hernia. No obstruction is present. No bowel wall thickening. The more distal small bowel is within normal limits. The appendix is visualized and normal. The colon is unremarkable. PERITONEUM AND RETROPERITONEUM: No ascites. No free air. VASCULATURE: Aorta is normal in caliber. LYMPH NODES: No lymphadenopathy. REPRODUCTIVE ORGANS: No acute abnormality. BONES AND SOFT TISSUES: Mild facet degenerative changes are present in the lower lumbar spine. No acute osseous abnormality. No focal soft tissue abnormality. IMPRESSION: 1. No evidence of nephrolithiasis or hydronephrosis. 2. Internal hernia in the left abdomen, consistent over multiple exams, without obstruction. If this area intermittently obstructs, this could be a source of the patient's pain. Electronically signed by: Lonni Necessary MD 03/10/2024 04:20 AM EDT RP Workstation: [id number]     Procedures   Medications Ordered in the ED  iohexol  (OMNIPAQUE ) 300 MG/ML solution 100 mL (100 mLs Intravenous Contrast Given 03/10/24 0345)                                    Medical Decision Making Amount and/or Complexity of Data Reviewed Labs: ordered. Radiology: ordered.  Risk Prescription drug management.   This patient presents to the ED for concern of left flank pain, this involves an extensive number of treatment options, and is a complaint that carries with it a  high risk of complications and morbidity.  The differential diagnosis includes nephrolithiasis, colitis, constipation, bowel obstruction, musculoskeletal etiology   Co morbidities / Chronic conditions that complicate the patient evaluation  anxiety, depression, anemia, HLD, OSA, GERD, bipolar disorder   Additional history obtained:  Additional history obtained from EMR External records from outside source obtained and reviewed including N/A   Lab Tests:  I Ordered, and personally interpreted labs.  The pertinent results include: Baseline hemoglobin, no leukocytosis, normal kidney function, normal electrolytes, no evidence of UTI or hematuria   Imaging Studies ordered:  I ordered imaging studies including CT of abdomen and pelvis I independently visualized and interpreted imaging which showed redemonstration of known internal hernia left abdomen.  No acute findings I agree with the radiologist interpretation   Cardiac Monitoring: / EKG:  The patient was maintained on a cardiac monitor.  I personally viewed and interpreted the cardiac monitored which showed an underlying rhythm of: Sinus rhythm   Problem List / ED Course / Critical interventions / Medication management  Patient presents for left-sided flank/abdominal pain.  On arrival in the ED, vital signs are normal.  Patient is well-appearing on exam.  Her abdomen and flank are soft.  She does have some mild tenderness to the palpation.  She declines any pain medication at this time.  Workup was initiated.  Patient is lab work was unremarkable.  There is no evidence of urine infection or hematuria.  She did undergo CT scan of abdomen and pelvis.  This showed redemonstration of the previously identified internal hernia on the left side of her abdomen.  This could be the source of her pain.  Alternatively, pain could musculoskeletal in nature.  She has been doing more intensive exercises lately.  CT scan showed no evidence of  obstruction.  Patient was informed of CT scan results and advised to follow-up with general surgery.  She was discharged in stable condition.  Social Determinants of Health:  Lives independently      Final diagnoses:  Left flank pain    ED Discharge Orders     None          Melvenia Motto, MD 03/10/24 [phone]

## 2024-03-10 NOTE — ED Notes (Signed)
 Patient transported to CT

## 2024-03-11 ENCOUNTER — Telehealth: Payer: Self-pay | Admitting: Cardiology

## 2024-03-11 DIAGNOSIS — M79661 Pain in right lower leg: Secondary | ICD-10-CM | POA: Diagnosis not present

## 2024-03-11 DIAGNOSIS — I872 Venous insufficiency (chronic) (peripheral): Secondary | ICD-10-CM | POA: Diagnosis not present

## 2024-03-11 DIAGNOSIS — R252 Cramp and spasm: Secondary | ICD-10-CM | POA: Diagnosis not present

## 2024-03-11 DIAGNOSIS — I83893 Varicose veins of bilateral lower extremities with other complications: Secondary | ICD-10-CM | POA: Diagnosis not present

## 2024-03-11 DIAGNOSIS — M79662 Pain in left lower leg: Secondary | ICD-10-CM | POA: Diagnosis not present

## 2024-03-11 NOTE — Telephone Encounter (Signed)
 Pt reports chest pain/discomfort on the left side, left center.  Non radiating, coming frequently.  Occurred 4 days in row last week, none since then.  I have blockages, my heart is fine but I have blockages.    Denies SOB, wt gain/edema, dizziness/light headedness.  Has not experienced any chest pain since last week. She does report Covid 2 weeks ago, states she self medicated with lemon, avacado and lime.  Aware will forward to provider for advisement as I cannot find an appt within the next month, and she is requesting to be seen. Pt aware office will call once MD reviews.  Pt agreeable to plan.

## 2024-03-11 NOTE — Telephone Encounter (Signed)
   Pt c/o of Chest Pain: STAT if active CP, including tightness, pressure, jaw pain, radiating pain to shoulder/upper arm/back, CP unrelieved by Nitro. Symptoms reported of SOB, nausea, vomiting, sweating.  1. Are you having CP right now? Chest pains, not at this time    2. Are you experiencing any other symptoms (ex. SOB, nausea, vomiting, sweating)?  Sometimes she says she sweat   3. Is your CP continuous or coming and going?  Comes and goes, but constantly coming and going   4. Have you taken Nitroglycerin ? no   5. How long have you been experiencing CP? started last week  - patient wants to be seen   6. If NO CP at time of call then end call with telling Pt to call back or call 911 if Chest pain returns prior to return call from triage team.

## 2024-03-12 NOTE — Telephone Encounter (Signed)
 Spoke with the patient and scheduled her for an appointment with DOD on Monday 7/28.

## 2024-03-13 DIAGNOSIS — J309 Allergic rhinitis, unspecified: Secondary | ICD-10-CM | POA: Diagnosis not present

## 2024-03-13 DIAGNOSIS — K219 Gastro-esophageal reflux disease without esophagitis: Secondary | ICD-10-CM | POA: Diagnosis not present

## 2024-03-13 DIAGNOSIS — H93293 Other abnormal auditory perceptions, bilateral: Secondary | ICD-10-CM | POA: Diagnosis not present

## 2024-03-16 ENCOUNTER — Ambulatory Visit: Attending: Internal Medicine | Admitting: Internal Medicine

## 2024-03-16 ENCOUNTER — Ambulatory Visit: Admitting: Cardiology

## 2024-03-16 ENCOUNTER — Encounter: Payer: Self-pay | Admitting: Internal Medicine

## 2024-03-16 VITALS — BP 113/78 | HR 72 | Ht 65.0 in | Wt 172.0 lb

## 2024-03-16 DIAGNOSIS — R072 Precordial pain: Secondary | ICD-10-CM

## 2024-03-16 DIAGNOSIS — E785 Hyperlipidemia, unspecified: Secondary | ICD-10-CM

## 2024-03-16 DIAGNOSIS — R931 Abnormal findings on diagnostic imaging of heart and coronary circulation: Secondary | ICD-10-CM

## 2024-03-16 DIAGNOSIS — R002 Palpitations: Secondary | ICD-10-CM | POA: Diagnosis not present

## 2024-03-16 DIAGNOSIS — Z720 Tobacco use: Secondary | ICD-10-CM | POA: Diagnosis not present

## 2024-03-16 DIAGNOSIS — I2510 Atherosclerotic heart disease of native coronary artery without angina pectoris: Secondary | ICD-10-CM

## 2024-03-16 NOTE — Progress Notes (Signed)
 Cardiology Office Note:  .    Date:  03/16/2024  ID:  Nicole [name], DOB 15-Mar-1961, MRN [id number] PCP: Trudy Mliss Dragon, MD  [facility] Providers Cardiologist:  Lynwood Schilling, MD     CC: DOD: Post Covid-19 CP  History of Present Illness: .    Nicole [name] is a 63 y.o. female with a history of bipolar disorder and anxiety who experiences chest pain associated with consuming foods high in cholesterol and smoking. She has been eating more cholesterol-rich foods than usual and has not been using her usual natural remedies, such as lemon and honey, which she believes help manage her cholesterol levels. The chest pain occurs when she smokes, although she has since reduced her smoking due to concerns about fentanyl and other substances.  She has a history of coronary artery disease, specifically non-obstructive coronary artery disease, and has undergone multiple evaluations since 2009, including ultrasounds and heart monitors. A past LDL level was 163 mg/dL and total cholesterol was 236 mg/dL in January 2024, with a previous high of 280 mg/dL. She has not been taking aspirin  regularly.  Her medical history includes obstructive sleep apnea, bipolar disorder, anxiety, depression, and hyperlipidemia. 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 She describes symptoms such as indentations on her leg and sharp eye pain, which she attributes to morphea.  Socially, she has a history of smoking, including occasional tobacco use, and previously smoked marijuana but has stopped due to safety concerns. She is cautious about medications and prefers natural remedies.  Discussed the use of AI scribe software for clinical note transcription with the patient, who gave verbal consent to proceed.   Relevant histories: .  Social  - [facility] Patient; does not take medications ROS: As per HPI.   Studies  Reviewed: .     Cardiac Studies & Procedures   ______________________________________________________________________________________________   STRESS TESTS  EXERCISE TOLERANCE TEST (ETT) 04/18/2022  Interpretation Summary   Patient exercised for 9 minutes, 10.1 METS.  No chest pain.  Normal blood pressure response.   There was no electrocardiographic evidence of ischemia.  Normal treadmill test.  [facility] treadmill score was 9, low risk.        CT SCANS  CT CORONARY MORPH W/CTA COR W/SCORE 07/29/2023  Addendum 08/10/2023  1:13 PM ADDENDUM REPORT: 08/10/2023 13:11  EXAM: OVER-READ INTERPRETATION  CT CHEST  The following report is an over-read performed by radiologist Dr. Marcey Diones [facility] on 08/10/2023. This over-read does not include interpretation of cardiac or coronary anatomy or pathology. The coronary CTA interpretation by the cardiologist is attached.  COMPARISON:  CTA of the chest on 06/18/2023  FINDINGS: The heart size is within normal limits. No pericardial fluid is identified. Visualized segments of the thoracic aorta and central pulmonary arteries are normal in caliber. Visualized mediastinum and hilar regions demonstrate no lymphadenopathy or masses. Stable bibasilar pulmonary scarring. Visualized lungs show no evidence of pulmonary edema, consolidation, pneumothorax, nodule or pleural fluid. Visualized upper abdomen and bony structures are unremarkable.  IMPRESSION: Stable bibasilar pulmonary scarring. No acute findings.   Electronically Signed By: Marcey Moan M.D. On: 08/10/2023 13:11  Narrative HISTORY: Chest pain, nonspecific chest pain  EXAM: Cardiac/Coronary  CT  TECHNIQUE: The patient was scanned on a [employer].  PROTOCOL: A 120 kV prospective scan was triggered in the descending thoracic aorta at 111 HU's. Axial non-contrast 3  mm slices were carried out through the heart. The data set was analyzed  on a dedicated work station and scored using the Agatston method. Gantry rotation speed was 250 msecs and collimation was .6 mm. Beta blockade and 0.8 mg of sl NTG was given. The 3D data set was reconstructed in 5% intervals of the 35-75 % of the R-R cycle. Systolic and diastolic phases were analyzed on a dedicated work station using MPR, MIP and VRT modes. The patient received contrast: 100mL OMNIPAQUE  IOHEXOL  350 MG/ML SOLN.  FINDINGS: Image quality: Good  Noise artifact is: Limited  Coronary calcium score is 93, which places the patient in the 88th percentile for age and sex matched control.  Coronary arteries: Normal coronary origins.  Right dominance.  Right Coronary Artery: Mild tubular stenosis of mid RCA, 25-49% stenosis.  Left Main Coronary Artery: No detectable plaque or stenosis.  Left Anterior Descending Coronary Artery: Mild mixed plaque, mid LAD, 25-49% stenosis. Ostial small caliber first diagonal has at least mild mixed plaque, 25-49% stenosis. Minimal mixed plaque ostial second diagonal, <25% stenosis, and mild mixed plaque in the mid portion of second diagonal 25-49% stenosis.  Left Circumflex Artery: Minimal plaque mid LCx, <25% stenosis.  Aorta: Normal size, 26 mm at the mid ascending aorta (level of the PA bifurcation) measured double oblique.  Aortic Valve: No calcifications.  Other findings:  Normal pulmonary vein drainage into the left atrium.  Normal left atrial appendage without thrombus.  Normal size of the pulmonary artery.  Redundant atrial septum.  Moderate left atrial dilation.  Mild right atrial dilation.  Please see separate report from [facility] for non-cardiac findings.  IMPRESSION: 1. Mild CAD, 25-49% stenosis, CADRADS 2.  2. Total plaque volume 138 mm3 which is 57th percentile for age- and sex-matched controls (calcified plaque 17 mm3; non-calcified plaque 121 mm3). TPV is moderate.  3. Coronary calcium score  of 93. This was 88th percentile for age and sex matched control.  4. Normal coronary origins with right dominance.  RECOMMENDATIONS: CAD-RADS 2. Mild non-obstructive CAD (25-49%). Consider non-atherosclerotic causes of chest pain. Consider preventive therapy and risk factor modification.  Electronically Signed: By: Soyla Merck M.D. On: 07/29/2023 21:32     ______________________________________________________________________________________________       Physical Exam:    VS:  BP 113/78 (BP Location: Left Arm)   Pulse 72   Ht 5' 5 (1.651 m)   Wt 172 lb (78 kg)   LMP 03/03/2016 (Approximate)   SpO2 96%   BMI 28.62 kg/m    Wt Readings from Last 3 Encounters:  03/16/24 172 lb (78 kg)  03/09/24 172 lb 13.5 oz (78.4 kg)  02/17/24 172 lb 12.8 oz (78.4 kg)    Gen: no distress  Neck: No JVD Cardiac: No Rubs or Gallops, no murmur, RRR +2 radial pulses Respiratory: Clear to auscultation bilaterally, normal effort, normal  respiratory rate GI: Soft, nontender, non-distended  MS: No  edema;  moves all extremities Integument: Skin feels warm Neuro:  At time of evaluation, alert and oriented to person/place/time/situation  Psych: Normal affect, patient feels ok   ASSESSMENT AND PLAN: .    Chest pain Intermittent chest pain possibly related to recent URI; atypical chest pain for CAD.She is hesitant about ischemic testing (deferred MPI) - Encourage smoking cessation to reduce cardiovascular risk  - Order pulmonary function test to assess for obstructive lung disease. - if she reconsiders off PET MPI  Nonobstructive coronary artery disease Nonobstructive coronary artery disease with previous  normal stress tests. She is considering aspirin  therapy but is concerned about bleeding risks. Plans to take aspirin  three times a week instead of daily. - Discuss aspirin  therapy and its benefits in reducing cardiovascular risk (secondary prevention)  Tobacco use Active tobacco  use with occasional cigarette smoking. She expresses desire to quit smoking due to health concerns, including potential impact on cardiovascular health.  Hyperlipidemia Hyperlipidemia with previous elevated LDL and total cholesterol levels. She prefers natural remedies and dietary changes over medication. Plans to reassess cholesterol levels in August after dietary modifications. - Encourage dietary changes to manage cholesterol levels. - Plan to reassess cholesterol levels in August.  Winter f/u with Dr. Denver team  Stanly Leavens, MD FASE [facility] Cardiologist [facility]  [address] [place], [address] [place], [place] [address] [phone]  10:31 AM

## 2024-03-16 NOTE — Patient Instructions (Signed)
 Medication Instructions:  Your physician recommends that you continue on your current medications as directed. Please refer to the Current Medication list given to you today.  *If you need a refill on your cardiac medications before your next appointment, please call your pharmacy*  Lab Work: NONE  If you have labs (blood work) drawn today and your tests are completely normal, you will receive your results only by: MyChart Message (if you have MyChart) OR A paper copy in the mail If you have any lab test that is abnormal or we need to change your treatment, we will call you to review the results.  Testing/Procedures: Your physician has recommended that you have a pulmonary function test. Pulmonary Function Tests are a group of tests that measure how well air moves in and out of your lungs.  Patient Instructions for Pulmonary Function Test  Do not smoke within at least 1 hour before the test. Do not consume Caffeine 4 hours prior to the test. Do not consume Alcohol within 4 hours prior to testing. Do not perform any Vigorous Exercise within 30 minutes before the test. Do not wear clothes that restrict the chest area or abdomen. Do not use albuterol  or Xopenex 3 hours before the test or any other nebulizer medications or inhalers.   Follow-Up: At [facility], you and your health needs are our priority.  As part of our continuing mission to provide you with exceptional heart care, our providers are all part of one team.  This team includes your primary Cardiologist (physician) and Advanced Practice Providers or APPs (Physician Assistants and Nurse Practitioners) who all work together to provide you with the care you need, when you need it.  Your next appointment:   5-6 month(s)  Provider:   Lynwood Schilling, MD or One of our Advanced Practice Providers (APPs): Morse Clause, PA-C  Lamarr Satterfield, NP Miriam Shams, NP  Olivia Pavy, PA-C Josefa Beauvais, NP  Leontine Salen,  PA-C Orren Fabry, PA-C  Hao Meng, PA-C Ernest Dick, NP  Damien Braver, NP Jon Hails, PA-C  Waddell Donath, PA-C    Dayna Dunn, PA-C  Scott Weaver, PA-C Lum Louis, NP Katlyn West, NP Callie Goodrich, PA-C  Evan Williams, PA-C Sheng Haley, PA-C  Xika Zhao, NP Kathleen Johnson, PA-C     We recommend signing up for the patient portal called MyChart.  Sign up information is provided on this After Visit Summary.  MyChart is used to connect with patients for Virtual Visits (Telemedicine).  Patients are able to view lab/test results, encounter notes, upcoming appointments, etc.  Non-urgent messages can be sent to your provider as well.   To learn more about what you can do with MyChart, go to [email or web address].

## 2024-03-25 NOTE — Telephone Encounter (Signed)
 Called and spoke with the patient.  Appt has been sch:  Name: Nicole, [name] MRN: [id number]  Date: 03/26/2024 Status: Sch  Time: 8:45 AM Length: 60  Visit Type: OPEN ESTABLISHED [726] Copay: $0.00  Provider: Edgardo Pontiff, DO       Copied from CRM [id number]. Topic: Referral - Request for Referral >> Mar 25, 2024  1:00 PM Miquel SAILOR wrote: Did the patient discuss referral with their provider in the last year? Yes (If No - schedule appointment) (If Yes - send message)  Appointment offered? No visit  on 07/28  Type of order/referral and detailed reason for visit: Hernia issue-Needs call back due to can not set app till referral is sent to PCP [phone]  Preference of office, provider, location: [facility] [address], [place], [place] [address] Phone: [phone]   If referral order, have you been seen by this specialty before? Yes (If Yes, this issue or another issue? When? Where? N/a  Can we respond through MyChart? No

## 2024-03-26 ENCOUNTER — Ambulatory Visit (INDEPENDENT_AMBULATORY_CARE_PROVIDER_SITE_OTHER)

## 2024-03-26 ENCOUNTER — Encounter: Payer: Self-pay | Admitting: Family Medicine

## 2024-03-26 VITALS — BP 123/81 | HR 87 | Temp 97.6°F | Ht 65.0 in | Wt 174.8 lb

## 2024-03-26 DIAGNOSIS — R109 Unspecified abdominal pain: Secondary | ICD-10-CM

## 2024-03-26 DIAGNOSIS — Z720 Tobacco use: Secondary | ICD-10-CM | POA: Insufficient documentation

## 2024-03-26 DIAGNOSIS — F17210 Nicotine dependence, cigarettes, uncomplicated: Secondary | ICD-10-CM

## 2024-03-26 DIAGNOSIS — H903 Sensorineural hearing loss, bilateral: Secondary | ICD-10-CM | POA: Diagnosis not present

## 2024-03-26 NOTE — Assessment & Plan Note (Signed)
 Patient went to the ED on 03/09/2024 for left flank pain.  As of today, patient mentions that her pain has gone down significantly.  CT scan showed left internal hernia and she is here to get a referral for general surgery.  We do believe she will benefit from a general surgery consult. --General Surgery referral placed

## 2024-03-26 NOTE — Assessment & Plan Note (Signed)
 Patient has currently been smoking 1 cigarette/day or 1 to 2 cigarettes/week.  Talked about the importance of not smoking especially before potential surgery.  Tried talking to patient about alternatives that could help with smoking cessation.  However, the patient did not want to hear about any options and said that she would quit smoking completely by herself.  Asked her to come see us  if she would like to talk about other options that will help supplement her smoking cessation.

## 2024-03-26 NOTE — Progress Notes (Signed)
 CC: Acute Visit  HPI:  Nicole [name] is a 62 y.o. female living with a history of CAD, HLD, prediabetes who presents today after her ED visit on 03/09/2024 for left flank pain.  Patient got a CT scan in the ED which showed that he had an internal hernia in the left abdomen without any obstruction--however if this area intermittently obstructs it could be a source for the patient's pain.  ED asked the patient to get evaluated by general surgeon.  However patient was told by the general surgery department that she needed a referral from her PCP which is why she showed up today.   Today, patient said that her left flank pain has gone down significantly.  She denies any fevers, chills, nausea, vomiting, shortness of breath.  She denies any urinary symptoms and states that she does have some pressure in her lower abdomen when she passes urine sometimes. Patient does mention smoking a cigarette per day or sometimes 1 to 2 cigarettes/week.  Please see problem based assessment and plan for additional details.    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 01/18/2022   Sensation of food items getting stuck in upper esophagus to be evaluated first by esophagram and then by EGD with Dr. Avram over the next few weeks.   Hypercholesterolemia    Morphea 2022   Nodule of skin of breast 01/04/2021   Obesity    OSA (obstructive sleep apnea) 12/27/2015   Palpitations    negative cardiac w/u per Dr. Edith 2/09   Reflux esophagitis    Sleep apnea    No CPAP   Vaginal discharge 03/02/2020    Current Outpatient Medications on File Prior to Visit  Medication Sig Dispense Refill   alendronate (FOSAMAX) 70 MG tablet Take  70 mg by mouth once a week. (Patient not taking: Reported on 03/16/2024)     aspirin  EC 81 MG tablet Take 81 mg by mouth daily. Swallow whole.     Cholecalciferol 50 MCG (2000 UT) CAPS Take 2,000 Units by mouth daily at 6 (six) AM.     estradiol (ESTRACE) 0.1 MG/GM vaginal cream as needed.     No current facility-administered medications on file prior to visit.    Family History  Problem Relation Age of Onset   Hypertension Mother    Diabetes Mother    Heart disease Mother    Thyroid  disease Mother    Glaucoma Mother    Diabetes Father    Hypertension Sister    Diabetes Sister    Diabetes Sister    Hypertension Sister    Sleep apnea Sister    Hypertension Sister    Diabetes Sister    Hypertension Brother    Diabetes Brother    Pancreatic cancer Maternal Uncle    Heart attack Maternal Grandmother    Heart attack Maternal Grandfather    Mental illness Son    Mental illness Son    Pancreatic cancer Other        family history   Colon cancer Neg Hx     Social History   Socioeconomic History   Marital status: Single  Spouse name: Not on file   Number of children: 3   Years of education: Not on file   Highest education level: Not on file  Occupational History   Not on file  Tobacco Use   Smoking status: Former    Current packs/day: 0.00    Average packs/day: 0.3 packs/day for 36.0 years (9.0 ttl pk-yrs)    Types: Cigarettes    Start date: 05/18/1986    Quit date: 05/18/2022    Years since quitting: 1.8   Smokeless tobacco: Never  Vaping Use   Vaping status: Never Used  Substance and Sexual Activity   Alcohol use: Yes    Comment: rarely   Drug use: Yes    Types: Marijuana    Comment: daily, smokes it   Sexual activity: Never  Other Topics Concern   Not on file  Social History Narrative   Current Social History 02/16/2021        Patient lives alone in a [place] which is 2 stories. There are 13 steps with handrails up to the entrance the patient uses.        Patient's method of transportation is city bus.      The highest level of education was high school diploma.      The patient currently disabled.      Identified important Relationships are My mother, my sisters, children, grandchildren (5)       Pets : None       Interests / Fun: Read, watch TV, movies, social media, talk on phone, shopping, travel       Current Stressors: Simple people get on my nerves,;drama.       Religious / Personal Beliefs: Christian, [place], Pentecostal.       Other: I stay away from people.      FREDRIK Alcide, BSN, RN-BC              Social Drivers of Health   Financial Resource Strain: Low Risk  (09/11/2023)   Overall Financial Resource Strain (CARDIA)    Difficulty of Paying Living Expenses: Not very hard  Food Insecurity: No Food Insecurity (09/11/2023)   Hunger Vital Sign    Worried About Running Out of Food in the Last Year: Never true    Ran Out of Food in the Last Year: Never true  Transportation Needs: No Transportation Needs (09/11/2023)   PRAPARE - Administrator, Civil Service (Medical): No    Lack of Transportation (Non-Medical): No  Physical Activity: Insufficiently Active (09/11/2023)   Exercise Vital Sign    Days of Exercise per Week: 4 days    Minutes of Exercise per Session: 20 min  Stress: No Stress Concern Present (09/11/2023)   [employer] of Occupational Health - Occupational Stress Questionnaire    Feeling of Stress : Only a little  Social Connections: Moderately Isolated (09/11/2023)   Social Connection and Isolation Panel    Frequency of Communication with Friends and Family: More than three times a week    Frequency of Social Gatherings with Friends and Family: More than three times a week    Attends Religious Services: 1 to 4 times per year    Active Member of [employer] or Organizations: No    Attends Banker Meetings: Never    Marital Status: Divorced  Catering manager Violence: Not At  Risk (09/11/2023)   Humiliation, Afraid, Rape, and Kick questionnaire    Fear of Current or Ex-Partner: No    Emotionally  Abused: No    Physically Abused: No    Sexually Abused: No    Review of Systems: ROS  All pertinent review of systems in the HPI and assessment and plan Vitals:   03/26/24 0820  BP: 123/81  Pulse: 87  Temp: 97.6 F (36.4 C)  TempSrc: Oral  SpO2: 95%  Weight: 174 lb 12.8 oz (79.3 kg)  Height: 5' 5 (1.651 m)    Physical Exam: Physical Exam HENT:     Head: Normocephalic.  Cardiovascular:     Rate and Rhythm: Normal rate and regular rhythm.  Pulmonary:     Effort: Pulmonary effort is normal.     Breath sounds: Normal breath sounds.  Abdominal:     Tenderness: There is no abdominal tenderness. There is no right CVA tenderness, left CVA tenderness, guarding or rebound.     Comments: Palpable umbilical hernia present when the patient flexes abdominal muscles  Neurological:     Mental Status: She is alert.      Assessment & Plan:     Patient seen with Dr. Karna  Assessment & Plan Left flank pain Patient went to the ED on 03/09/2024 for left flank pain.  As of today, patient mentions that her pain has gone down significantly.  CT scan showed left internal hernia and she is here to get a referral for general surgery.  We do believe she will benefit from a general surgery consult. --General Surgery referral placed Tobacco use Patient has currently been smoking 1 cigarette/day or 1 to 2 cigarettes/week.  Talked about the importance of not smoking especially before potential surgery.  Tried talking to patient about alternatives that could help with smoking cessation.  However, the patient did not want to hear about any options and said that she would quit smoking completely by herself.  Asked her to come see us  if she would like to talk about other options that will help supplement her smoking cessation.    Orders Placed This Encounter  Procedures    Ambulatory referral to General Surgery    Referral Priority:   Routine    Referral Type:   Surgical    Referral Reason:   Specialty Services Required    Requested Specialty:   General Surgery    Number of Visits Requested:   1     Rebecka Pion, D.O. [facility] Internal Medicine, PGY-1 Date 03/26/2024 Time 9:40 AM

## 2024-03-26 NOTE — Patient Instructions (Signed)
 You were seen for a follow-up of your ED visit.  Please follow the instructions that were discussed in today's plan: --Your recent CT scan showed you had an internal hernia in the left abdomen.   --We have placed a general surgery referral.  You will be contacted by general surgery department for further information.

## 2024-03-27 NOTE — Progress Notes (Signed)
 Internal Medicine Clinic Attending  I was physically present during the key portions of the resident provided service and participated in the medical decision making of patient's management care. I reviewed pertinent patient test results.  The assessment, diagnosis, and plan were formulated together and I agree with the documentation in the resident's note.  Dickie La, MD

## 2024-03-27 NOTE — Addendum Note (Signed)
 Addended by: KARNA FELLOWS on: 03/27/2024 09:09 AM   Modules accepted: Level of Service

## 2024-04-15 ENCOUNTER — Ambulatory Visit: Payer: Self-pay | Admitting: General Surgery

## 2024-04-15 ENCOUNTER — Telehealth: Payer: Self-pay

## 2024-04-15 DIAGNOSIS — K458 Other specified abdominal hernia without obstruction or gangrene: Secondary | ICD-10-CM | POA: Diagnosis not present

## 2024-04-15 DIAGNOSIS — K429 Umbilical hernia without obstruction or gangrene: Secondary | ICD-10-CM | POA: Diagnosis not present

## 2024-04-15 NOTE — Telephone Encounter (Signed)
   Pre-operative Risk Assessment    Patient Name: Nicole [name]  DOB: 08-Feb-1961 MRN: [id number]   Date of last office visit: 03/16/24 [facility], MD Date of next office visit: 07/13/24 ORREN FABRY, PA-C   Request for Surgical Clearance    Procedure:  HERNIA SURGERY  Date of Surgery:  Clearance TBD                                Surgeon:  CORDELLA IDLER, MD Surgeon's Group or Practice Name:  [facility] Phone number:  [phone] Fax number:  [phone]   ATTN: ROSELINE ARGYLE, CMA   Type of Clearance Requested:   - Medical  - Pharmacy:  Hold Aspirin      Type of Anesthesia:  General    Additional requests/questions:    Signed, Lucie DELENA Ku   04/15/2024, 11:13 AM

## 2024-04-15 NOTE — Telephone Encounter (Signed)
 Dr. Santo You recently saw this patient 03/16/24 for chest pain felt atypical. No known obstructive disease.   We are asked for preop recommendations prior to hernia surgery. Do you recommend any additional testing prior to surgery?  Thanks Angie

## 2024-04-15 NOTE — Telephone Encounter (Signed)
   Name: Nicole [name]  DOB: Nov 26, 1960  MRN: [id number]   Primary Cardiologist: Lynwood Schilling, MD  Chart reviewed as part of pre-operative protocol coverage.  Daielle Melcher Pennebaker was last seen on 03/16/24 by Dr. Santo.    She reported CP felt atypical after COVID-19 infection. She declined nuclear stress testing.   I will route this recommendation to the requesting party via Epic fax function and remove from pre-op pool. Please call with questions.  Jon Garre Thara Searing, PA 04/15/2024, 12:11 PM

## 2024-04-23 ENCOUNTER — Other Ambulatory Visit: Payer: Self-pay

## 2024-04-23 ENCOUNTER — Encounter: Payer: Self-pay | Admitting: Physician Assistant

## 2024-04-23 ENCOUNTER — Ambulatory Visit (INDEPENDENT_AMBULATORY_CARE_PROVIDER_SITE_OTHER): Admitting: Physician Assistant

## 2024-04-23 DIAGNOSIS — G8929 Other chronic pain: Secondary | ICD-10-CM | POA: Diagnosis not present

## 2024-04-23 DIAGNOSIS — M25562 Pain in left knee: Secondary | ICD-10-CM

## 2024-04-23 DIAGNOSIS — M25561 Pain in right knee: Secondary | ICD-10-CM

## 2024-04-23 NOTE — Progress Notes (Signed)
 Office Visit Note   Patient: Nicole [name]           Date of Birth: 31-May-1961           MRN: [id number] Visit Date: 04/23/2024              Requested by: Trudy Mliss Dragon, MD [address] [place], [address] [place],  [place] [address] PCP: Trudy Mliss Dragon, MD   Assessment & Plan: Visit Diagnoses:  1. Chronic pain of both knees     Plan: Pleasant 63 year old woman comes in today with bilateral knee pain.  She said she had a fall onto her knees 3 days ago.  She landed on the front of her knees.  She got an abrasion on the front of her left knee no signs of infection.  Exam today is benign she has no effusions no instability compartments are soft and compressible.  I have recommended treating this symptomatically with Tylenol .  Would like to reexamine her in 2 weeks.  At that time she would like also to have her back examined and I said that would be fine.  If she had any increasing back symptoms before that visit should be seen in an urgent care  Follow-Up Instructions: No follow-ups on file.   Orders:  Orders Placed This Encounter  Procedures   XR KNEE 3 VIEW LEFT   XR KNEE 3 VIEW RIGHT   No orders of the defined types were placed in this encounter.     Procedures: No procedures performed   Clinical Data: No additional findings.   Subjective: Chief Complaint  Patient presents with   Right Knee - Pain   Left Knee - Pain    HPI pleasant 63 year old woman comes in 3 days status post falling onto her knees.  She has a small abrasion on the left side.  She is ambulating without assistance.  She is not really taking anything right now for pain.  Review of Systems  All other systems reviewed and are negative.    Objective: Vital Signs: LMP 03/03/2016 (Approximate)   Physical Exam Constitutional:      Appearance: Normal appearance.  Pulmonary:     Effort: Pulmonary effort is normal.  Skin:    General: Skin is warm and dry.  Neurological:     General: No  focal deficit present.     Mental Status: She is alert and oriented to person, place, and time.     Ortho Exam  Specialty Comments:  No specialty comments available.  Imaging: No results found.   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 01/18/2022   Sensation of food items getting stuck in upper esophagus to be evaluated first by esophagram and then by EGD with Dr.  Avram over the next few weeks.   Hypercholesterolemia    Morphea 2022   Nodule of skin of breast 01/04/2021   Obesity    OSA (obstructive sleep apnea) 12/27/2015   Palpitations    negative cardiac w/u per Dr. Edith 2/09   Reflux esophagitis    Sleep apnea    No CPAP   Vaginal discharge 03/02/2020    Family History  Problem Relation Age of Onset   Hypertension Mother    Diabetes Mother    Heart disease Mother    Thyroid  disease Mother    Glaucoma Mother    Diabetes Father    Hypertension Sister    Diabetes Sister    Diabetes Sister    Hypertension Sister    Sleep apnea Sister    Hypertension Sister    Diabetes Sister    Hypertension Brother    Diabetes Brother    Pancreatic cancer Maternal Uncle    Heart attack Maternal Grandmother    Heart attack Maternal Grandfather    Mental illness Son    Mental illness Son    Pancreatic cancer Other        family history   Colon cancer Neg Hx     Past Surgical History:  Procedure Laterality Date   COLONOSCOPY     ECTOPIC PREGNANCY SURGERY     EXPLORATORY LAPAROTOMY  2000   x2 (Dr. Shanda Muscat)   HERPES SIMPLEX VIRUS DFA     TUBAL LIGATION  1987   Social History   Occupational History   Not on file  Tobacco Use   Smoking status: Former    Current packs/day: 0.00    Average packs/day: 0.3 packs/day for 36.0 years (9.0 ttl pk-yrs)    Types: Cigarettes    Start date: 05/18/1986    Quit date: 05/18/2022    Years since quitting: 1.9   Smokeless tobacco: Never  Vaping Use   Vaping status: Never Used  Substance and Sexual Activity   Alcohol use: Yes    Comment: rarely   Drug use: Yes    Types: Marijuana    Comment: daily, smokes it   Sexual activity: Never

## 2024-04-24 ENCOUNTER — Encounter (HOSPITAL_COMMUNITY)

## 2024-04-28 ENCOUNTER — Ambulatory Visit: Admitting: Physician Assistant

## 2024-04-30 ENCOUNTER — Ambulatory Visit (HOSPITAL_COMMUNITY)
Admission: RE | Admit: 2024-04-30 | Discharge: 2024-04-30 | Disposition: A | Discharge status: Home / Self Care | Source: Ambulatory Visit | Attending: Internal Medicine | Admitting: Internal Medicine

## 2024-04-30 DIAGNOSIS — F17210 Nicotine dependence, cigarettes, uncomplicated: Secondary | ICD-10-CM | POA: Diagnosis not present

## 2024-04-30 DIAGNOSIS — Z720 Tobacco use: Secondary | ICD-10-CM | POA: Insufficient documentation

## 2024-04-30 DIAGNOSIS — J988 Other specified respiratory disorders: Secondary | ICD-10-CM | POA: Insufficient documentation

## 2024-04-30 LAB — PULMONARY FUNCTION TEST
DL/VA % pred: 85 %
DL/VA: 3.56 ml/min/mmHg/L
DLCO unc % pred: 76 %
DLCO unc: 15.75 ml/min/mmHg
FEF 25-75 Post: 1.65 L/s
FEF 25-75 Pre: 2.04 L/s
FEF2575-%Change-Post: -19 %
FEF2575-%Pred-Post: 73 %
FEF2575-%Pred-Pre: 91 %
FEV1-%Change-Post: -2 %
FEV1-%Pred-Post: 87 %
FEV1-%Pred-Pre: 89 %
FEV1-Post: 2.23 L
FEV1-Pre: 2.29 L
FEV1FVC-%Change-Post: 4 %
FEV1FVC-%Pred-Pre: 100 %
FEV6-%Change-Post: -6 %
FEV6-%Pred-Post: 86 %
FEV6-%Pred-Pre: 92 %
FEV6-Post: 2.75 L
FEV6-Pre: 2.95 L
FEV6FVC-%Change-Post: 0 %
FEV6FVC-%Pred-Post: 103 %
FEV6FVC-%Pred-Pre: 103 %
FVC-%Change-Post: -6 %
FVC-%Pred-Post: 82 %
FVC-%Pred-Pre: 89 %
FVC-Post: 2.75 L
FVC-Pre: 2.95 L
Post FEV1/FVC ratio: 81 %
Post FEV6/FVC ratio: 100 %
Pre FEV1/FVC ratio: 77 %
Pre FEV6/FVC Ratio: 100 %
RV % pred: 93 %
RV: 1.98 L
TLC % pred: 93 %
TLC: 4.88 L

## 2024-04-30 MED ORDER — ALBUTEROL SULFATE (2.5 MG/3ML) 0.083% IN NEBU
2.5000 mg | INHALATION_SOLUTION | Freq: Once | RESPIRATORY_TRACT | Status: AC
  Administered 2024-04-30: 2.5 mg via RESPIRATORY_TRACT

## 2024-05-01 ENCOUNTER — Other Ambulatory Visit: Payer: Self-pay

## 2024-05-01 ENCOUNTER — Encounter: Payer: Self-pay | Admitting: Allergy

## 2024-05-01 ENCOUNTER — Ambulatory Visit (INDEPENDENT_AMBULATORY_CARE_PROVIDER_SITE_OTHER): Admitting: Allergy

## 2024-05-01 VITALS — BP 106/80 | HR 80 | Temp 97.6°F | Resp 16 | Ht 65.5 in | Wt 172.0 lb

## 2024-05-01 DIAGNOSIS — Z77120 Contact with and (suspected) exposure to mold (toxic): Secondary | ICD-10-CM | POA: Diagnosis not present

## 2024-05-01 DIAGNOSIS — J3089 Other allergic rhinitis: Secondary | ICD-10-CM | POA: Diagnosis not present

## 2024-05-01 DIAGNOSIS — R053 Chronic cough: Secondary | ICD-10-CM | POA: Diagnosis not present

## 2024-05-01 DIAGNOSIS — Z720 Tobacco use: Secondary | ICD-10-CM

## 2024-05-01 MED ORDER — AZELASTINE HCL 0.1 % NA SOLN: 1.0000 | Freq: Two times a day (BID) | NASAL | 3 refills | Status: DC | PRN

## 2024-05-01 NOTE — Patient Instructions (Addendum)
 Mold exposure/rhinitis Use azelastine  nasal spray 1-2 sprays per nostril twice a day as needed for runny nose/drainage. Nasal saline spray (i.e., Simply Saline) or nasal saline lavage (i.e., NeilMed) is recommended as needed and prior to medicated nasal sprays. Recommend to stop smoking - as it can irritate your nasal passages.  Get bloodwork We are ordering labs, so please allow 1-2 weeks for the results to come back. With the newly implemented Cures Act, the labs might be visible to you at the same time that they become visible to me. However, I will not address the results until all of the results are back, so please be patient.  In the meantime, continue recommendations in your patient instructions, including avoidance measures (if applicable), until you hear from me.  Breathing/coughing Normal breathing test today.  May use albuterol  rescue inhaler 2 puffs every 4 to 6 hours as needed for shortness of breath, chest tightness, coughing, and wheezing.  Monitor frequency of use - if you need to use it more than twice per week on a consistent basis let us  know.   Follow up as needed.    Mold Control Mold and fungi can grow on a variety of surfaces provided certain temperature and moisture conditions exist.  Outdoor molds grow on plants, decaying vegetation and soil. The major outdoor mold, Alternaria and Cladosporium, are found in very high numbers during hot and dry conditions. Generally, a late summer - fall peak is seen for common outdoor fungal spores. Rain will temporarily lower outdoor mold spore count, but counts rise rapidly when the rainy period ends. The most important indoor molds are Aspergillus and Penicillium. Dark, humid and poorly ventilated basements are ideal sites for mold growth. The next most common sites of mold growth are the bathroom and the kitchen. Outdoor (Seasonal) Mold Control Use air conditioning and keep windows closed. Avoid exposure to decaying  vegetation. Avoid leaf raking. Avoid grain handling. Consider wearing a face mask if working in moldy areas.  Indoor (Perennial) Mold Control  Maintain humidity below 50%. Get rid of mold growth on hard surfaces with water, detergent and, if necessary, 5% bleach (do not mix with other cleaners). Then dry the area completely. If mold covers an area more than 10 square feet, consider hiring an indoor environmental professional. For clothing, washing with soap and water is best. If moldy items cannot be cleaned and dried, throw them away. Remove sources e.g. contaminated carpets. Repair and seal leaking roofs or pipes. Using dehumidifiers in damp basements may be helpful, but empty the water and clean units regularly to prevent mildew from forming. All rooms, especially basements, bathrooms and kitchens, require ventilation and cleaning to deter mold and mildew growth. Avoid carpeting on concrete or damp floors, and storing items in damp areas.

## 2024-05-01 NOTE — Progress Notes (Signed)
 New Patient Note  RE: Nicole [name] MRN: [id number] DOB: 07/23/1961 Date of Office Visit: 05/01/2024  Consult requested by: Benay Kay (PA) Primary care provider: Trudy Mliss Dragon, MD  Chief Complaint: Establish Care (She would like to check allergy because she has mold in her house. She has cough, rash, runny eyes and nose. )  History of Present Illness: I had the pleasure of seeing Nicole [name] for initial evaluation at the [facility] on 05/01/2024. She is a 63 y.o. female, who is referred here by Benay Kay (PA - ENT) for the evaluation of allergic rhinitis.  Discussed the use of AI scribe software for clinical note transcription with the patient, who gave verbal consent to proceed.    She has been living in a townhouse with significant mold exposure for the past six years, which has progressively worsened. There is visible mold on her belongings and skin, and her home has experienced water damage due to a burst pipe under the concrete slab.  She experiences persistent respiratory symptoms, including a cough, runny eyes, and nasal congestion, which are non-seasonal. She has used over-the-counter nasal sprays for her symptoms, which she finds helpful, but cannot recall specific medication names. No history of sinus infections requiring antibiotics and no prior allergy testing. She has been prescribed an albuterol  inhaler, though she does not use it regularly. She reports occasional shortness of breath, particularly with exertion, but has not used an inhaler in the past two years. No significant wheezing or chest tightness but occasional wheezing related to smoking.  She reports skin manifestations, describing 'mold stuff, circles on my skin,' present for one to two years.  She has a history of migraines and reports headaches, attributing them to her known migraine condition. She also has a history of high cholesterol and coronary artery disease. She is not  currently taking any prescribed medications regularly, including those for her heart condition, and only occasionally uses over-the-counter pain medications like ibuprofen  or Aleve .  She lives in a townhouse with mold issues, smokes cigarettes occasionally, and uses marijuana sometimes. She is on disability and does not currently work. No recent fevers or chills, and she had COVID-19 two months ago.      She reports symptoms of coughing, watery eyes, rhinorrhea, nasal congestion. Symptoms have been going on for 1-2 years. The symptoms are present all year around. Anosmia: no. Headache: sometimes. She has used otc nasal sprays with some improvement in symptoms. Sinus infections: none. Previous work up includes: no. Previous ENT evaluation: yes. Last eye exam: last year. History of reflux: yes - takes medications sometimes.  03/13/2024 ENT visit: Nicole [name] is a 63 y.o. female with history of GERD, allergic rhinitis and possible hearing loss. Normal head and neck exam. I recommend referral to an allergist for formal testing. We discussed reflux precautions including lifestyle and dietary modifications. We will schedule an audiogram at her convenience.Monitor for new or worsening symptoms.   06/17/2023 CXR: IMPRESSION: No active cardiopulmonary disease.  Assessment and Plan: Nicole [name] is a 63 y.o. female with: Mold exposure Other allergic rhinitis Tobacco user Chronic rhinitis with persistent cough, rhinorrhea, and nasal congestion. No prior allergy testing. Saw ENT. Patient concerned about the mold exposure in her home contributing to her symptoms. Use azelastine  nasal spray 1-2 sprays per nostril twice a day as needed for runny nose/drainage. Nasal saline spray (i.e., Simply Saline) or nasal saline lavage (i.e., NeilMed) is recommended as needed and prior to medicated nasal  sprays. Recommend to stop smoking - as it can irritate your nasal passages. Get bloodwork. Gave handout on environmental  control measures for mold.   Chronic cough Intermittent coughing and dyspnea. Inhaler prescribed but not used regularly. Occasional cigarette and marijuana use may exacerbate symptoms. Normal spirometry today. May use albuterol  rescue inhaler 2 puffs every 4 to 6 hours as needed for shortness of breath, chest tightness, coughing, and wheezing.  Monitor frequency of use - if you need to use it more than twice per week on a consistent basis let us  know.   Return if symptoms worsen or fail to improve.  Meds ordered this encounter  Medications   azelastine  (ASTELIN ) 0.1 % nasal spray    Sig: Place 1-2 sprays into both nostrils 2 (two) times daily as needed (nasal drainage). Use in each nostril as directed    Dispense:  30 mL    Refill:  3   Lab Orders         Allergens w/Total IgE Area 2         Allergens (11) Molds      Other allergy screening: Asthma:  Coughing and shortness of breath at times. She has not used prescribed inhaler. Unclear history - improved with weight loss.  Food allergy: no Medication allergy: yes Unknown pain medications - itching.  Hymenoptera allergy: no Urticaria: no Eczema:no History of recurrent infections suggestive of immunodeficency: no  Diagnostics: Spirometry:  Tracings reviewed. Her effort: Good reproducible efforts. FVC: 3.01L FEV1: 2.14L, 101% predicted FEV1/FVC ratio: 71% Interpretation: Spirometry consistent with normal pattern.  Please see scanned spirometry results for details.   Results discussed with patient/family.   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 01/18/2022   Sensation of food items getting stuck in upper esophagus to be evaluated first by esophagram and then by EGD with Dr. Avram over the next few weeks.   Hypercholesterolemia    Morphea 2022   Nodule of skin of breast  01/04/2021   Obesity    OSA (obstructive sleep apnea) 12/27/2015   Palpitations    negative cardiac w/u per Dr. Edith 2/09   Reflux esophagitis    Sleep apnea    No CPAP   Vaginal discharge 03/02/2020   Past Surgical History: Past Surgical History:  Procedure Laterality Date   COLONOSCOPY     ECTOPIC PREGNANCY SURGERY     EXPLORATORY LAPAROTOMY  2000   x2 (Dr. Shanda Muscat)   HERPES SIMPLEX VIRUS DFA     TUBAL LIGATION  1987   Medication List:  Current Outpatient Medications   Medication Sig Dispense Refill   azelastine  (ASTELIN ) 0.1 % nasal spray Place 1-2 sprays into both nostrils 2 (two) times daily as needed (nasal drainage). Use in each nostril as directed 30 mL 3   alendronate (FOSAMAX) 70 MG tablet Take 70 mg by mouth once a week. (Patient not taking: Reported on 05/01/2024)     aspirin  EC 81 MG tablet Take 81 mg by mouth daily. Swallow whole. (Patient not taking: Reported on 05/01/2024)     Cholecalciferol 50 MCG (2000 UT) CAPS Take 2,000 Units by mouth daily at 6 (six) AM. (Patient not taking: Reported on 05/01/2024)     estradiol (ESTRACE) 0.1 MG/GM vaginal cream as needed. (Patient not taking: Reported on 05/01/2024)     No current facility-administered medications for this visit.   Allergies: Allergies  Allergen Reactions   Other Itching and Hives    Pain medication ordered at [facility] in 2009 - Patient does not know name of medication  Pain medication given at [facility] approximately 2009 Pain medication ordered at [facility] in 2009 - Patient does not know name of medication  Pain medication given at [facility] approximately 2009   Social History: Social History   Socioeconomic History   Marital status: Single    Spouse name: Not on file   Number of children: 3   Years of education: Not on file   Highest education level: Not on file  Occupational History   Not on file  Tobacco Use   Smoking status: Former    Current packs/day: 0.00    Average packs/day: 0.3 packs/day for 36.0 years (9.0 ttl pk-yrs)    Types: Cigarettes    Start date: 05/18/1986    Quit date: 05/18/2022    Years since quitting: 1.9   Smokeless tobacco: Never  Vaping Use   Vaping status: Never Used  Substance and Sexual Activity   Alcohol use: Yes    Comment: rarely   Drug use: Yes    Types: Marijuana    Comment: daily, smokes it   Sexual activity: Never  Other Topics Concern   Not on file  Social History Narrative   Current Social History  02/16/2021        Patient lives alone in a [place] which is 2 stories. There are 13 steps with handrails up to the entrance the patient uses.       Patient's method of transportation is city bus.      The highest level of education was high school diploma.      The patient currently disabled.      Identified important Relationships are My mother, my sisters, children, grandchildren (5)       Pets : None       Interests / Fun: Read, watch TV, movies, social media, talk on phone, shopping, travel       Current Stressors:  Simple people get on my nerves,;drama.       Religious / Personal Beliefs: Christian, [place], Pentecostal.       Other: I stay away from people.      FREDRIK Alcide, BSN, RN-BC              Social Drivers of Health   Financial Resource Strain: Low Risk  (09/11/2023)   Overall Financial Resource Strain (CARDIA)    Difficulty of Paying Living Expenses: Not very hard  Food Insecurity: No Food Insecurity (09/11/2023)   Hunger Vital Sign    Worried About Running Out of Food in the Last Year: Never true    Ran Out of Food in the Last Year: Never true  Transportation Needs: No Transportation Needs (09/11/2023)   PRAPARE - Administrator, Civil Service (Medical): No    Lack of Transportation (Non-Medical): No  Physical Activity: Insufficiently Active (09/11/2023)   Exercise Vital Sign    Days of Exercise per Week: 4 days    Minutes of Exercise per Session: 20 min  Stress: No Stress Concern Present (09/11/2023)   [employer] of Occupational Health - Occupational Stress Questionnaire    Feeling of Stress : Only a little  Social Connections: Moderately Isolated (09/11/2023)   Social Connection and Isolation Panel    Frequency of Communication with Friends and Family: More than three times a week    Frequency of Social Gatherings with Friends and Family: More than three times a week    Attends Religious Services: 1 to 4 times per year     Active Member of [employer] or Organizations: No    Attends Banker Meetings: Never    Marital Status: Divorced   Lives in a townhouse. Smoking: 1 cig per day Occupation: not employed, on disability  Environmental History: Water Damage/mildew in the house: yes Carpet in the family room: yes Carpet in the bedroom: yes Heating: electric Cooling: central Pet: no  Family History: Family History  Problem Relation Age of Onset   Hypertension Mother    Diabetes Mother    Heart disease Mother    Thyroid  disease Mother    Glaucoma Mother    Diabetes Father    Hypertension Sister    Diabetes Sister    Diabetes Sister    Hypertension Sister    Sleep apnea Sister    Hypertension Sister    Diabetes Sister    Hypertension Brother    Diabetes Brother    Pancreatic cancer Maternal Uncle    Heart attack Maternal Grandmother    Heart attack Maternal Grandfather    Mental illness Son    Mental illness Son    Pancreatic cancer Other        family history   Colon cancer Neg Hx    Problem                               Relation Asthma                                   children Eczema                                granddaughter Food allergy  no Allergic rhino conjunctivitis     no  Review of Systems  Constitutional:  Negative for appetite change, chills, fever and unexpected weight change.  HENT:  Positive for congestion and rhinorrhea.   Eyes:  Negative for itching.  Respiratory:  Positive for cough, shortness of breath and wheezing. Negative for chest tightness.   Cardiovascular:  Negative for chest pain.  Gastrointestinal:  Negative for abdominal pain.  Genitourinary:  Negative for difficulty urinating.  Skin:  Negative for rash.  Neurological:  Positive for headaches.    Objective: BP 106/80 (BP Location: Left Arm, Patient Position: Sitting, Cuff Size: Normal)   Pulse 80   Temp 97.6 F (36.4 C) (Temporal)   Resp 16   Ht 5' 5.5 (1.664 m)    Wt 172 lb (78 kg)   LMP 03/03/2016 (Approximate)   SpO2 96%   BMI 28.19 kg/m  Body mass index is 28.19 kg/m. Physical Exam Vitals and nursing note reviewed.  Constitutional:      Appearance: Normal appearance. She is well-developed.  HENT:     Head: Normocephalic and atraumatic.     Right Ear: Tympanic membrane and external ear normal.     Left Ear: Tympanic membrane and external ear normal.     Nose: Nose normal.     Mouth/Throat:     Mouth: Mucous membranes are moist.     Pharynx: Oropharynx is clear.  Eyes:     Conjunctiva/sclera: Conjunctivae normal.  Cardiovascular:     Rate and Rhythm: Normal rate and regular rhythm.     Heart sounds: Normal heart sounds. No murmur heard.    No friction rub. No gallop.  Pulmonary:     Effort: Pulmonary effort is normal.     Breath sounds: Normal breath sounds. No wheezing, rhonchi or rales.  Musculoskeletal:     Cervical back: Neck supple.  Skin:    General: Skin is warm.     Findings: No rash.  Neurological:     Mental Status: She is alert and oriented to person, place, and time.  Psychiatric:        Behavior: Behavior normal.    The plan was reviewed with the patient/family, and all questions/concerned were addressed.  It was my pleasure to see Nicole [name] today and participate in her care. Please feel free to contact me with any questions or concerns.  Sincerely,  Orlan Cramp, DO Allergy & Immunology  [facility]  [place] office: [phone] [facility] office: [phone]

## 2024-05-04 ENCOUNTER — Ambulatory Visit: Payer: Self-pay | Admitting: Allergy

## 2024-05-04 ENCOUNTER — Ambulatory Visit: Payer: Self-pay

## 2024-05-04 LAB — ALLERGENS W/TOTAL IGE AREA 2

## 2024-05-04 LAB — ALLERGENS (11) MOLDS
Candida Albicans IgE: <0.1 kU/L
M009-IgE Fusarium proliferatum: <0.1 kU/L
M014-IgE Epicoccum purpur: <0.1 kU/L
M202-IgE Acremonium kiliense: <0.1 kU/L
Phoma Betae IgE: <0.1 kU/L
Setomelanomma Rostrat: <0.1 kU/L
Stemphylium Herbarum IgE: <0.1 kU/L

## 2024-05-04 NOTE — Progress Notes (Signed)
 Please call patient. Environmental panel and mold panel was all negative which is great.  You can still use your nasal sprays as needed. Decrease smoking as that can irritate the nasal passages.  Follow up only as needed.

## 2024-05-05 ENCOUNTER — Other Ambulatory Visit: Payer: Self-pay

## 2024-05-05 ENCOUNTER — Ambulatory Visit (INDEPENDENT_AMBULATORY_CARE_PROVIDER_SITE_OTHER): Admitting: Physician Assistant

## 2024-05-05 ENCOUNTER — Encounter: Payer: Self-pay | Admitting: Physician Assistant

## 2024-05-05 DIAGNOSIS — M545 Low back pain, unspecified: Secondary | ICD-10-CM | POA: Diagnosis not present

## 2024-05-05 DIAGNOSIS — G8929 Other chronic pain: Secondary | ICD-10-CM | POA: Diagnosis not present

## 2024-05-05 MED ORDER — MELOXICAM 7.5 MG PO TABS
7.5000 mg | ORAL_TABLET | Freq: Every day | ORAL | 0 refills | Status: DC
Start: 2024-05-05 — End: 2024-06-02

## 2024-05-05 NOTE — Progress Notes (Signed)
 Office Visit Note   Patient: Nicole [name]           Date of Birth: [birth year]           MRN: [id number] Visit Date: 05/05/2024              Requested by: Trudy Mliss Dragon, MD [address] [place], [address] [place],  [place] [address] PCP: Trudy Mliss Dragon, MD   Assessment & Plan: Visit Diagnoses:  1. Chronic bilateral low back pain without sciatica     Plan: Patient is a pleasant 63 year old woman who comes in today with a chief complaint of lower back pain.  She had this happen a couple weeks ago when she sustained a fall while on uneven tripping on a sidewalk.  And tried to catch herself.  X-rays did not show any acute changes exam is fairly well benign.  She has good strength no paresthesias.  Recommend a short course of meloxicam  she knows not to take other anti-inflammatories with it she also points out a area on her anterior left shin that just looks funny to her.  Apparently she has had dermatology biopsy this area she has no calf pain or leg exam does not show any evidence of cellulitis or DVT.  It is all anterior would have her follow-up with her dermatologist first if this proves negative could consider an MRI of her left lower leg  Follow-Up Instructions: Return if symptoms worsen or fail to improve.   Orders:  Orders Placed This Encounter  Procedures   XR Lumbar Spine 2-3 Views   No orders of the defined types were placed in this encounter.     Procedures: No procedures performed   Clinical Data: No additional findings.   Subjective: No chief complaint on file.   HPI Patient is a pleasant 63 year old woman who I saw couple weeks ago after she had fallen onto her knees.  At that time she complained of bilateral knee pain exam was benign.  She is getting better although she still has some pain in her left leg.  At that time she describes some back pain.  She is here to follow-up on the back pain. Review of Systems  All other systems reviewed and are  negative.    Objective: Vital Signs: LMP 03/03/2016 (Approximate)   Physical Exam Constitutional:      Appearance: Normal appearance.  Pulmonary:     Effort: Pulmonary effort is normal.  Skin:    General: Skin is warm and dry.  Neurological:     General: No focal deficit present.     Mental Status: She is alert and oriented to person, place, and time.  Psychiatric:        Mood and Affect: Mood normal.        Behavior: Behavior normal.     Ortho Exam Examination of her lower back she has a little bit of pain in the paravertebral musculature with forward flexion and extension especially on the left no redness no erythema no step-offs she has good bilateral lower extremity strength with resisted dorsiflexion plantarflexion extension flexion of her legs.  Compartments are soft and compressible negative Homans' sign no evidence of infection Specialty Comments:  No specialty comments available.  Imaging: No results found.   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 01/18/2022   Sensation of food items getting stuck in upper esophagus to be evaluated first by esophagram and then by EGD with Dr. Avram over the next few weeks.   Hypercholesterolemia    Morphea 2022   Nodule of skin of breast 01/04/2021   Obesity    OSA (obstructive sleep apnea) 12/27/2015   Palpitations    negative cardiac w/u per Dr. Edith 2/09   Reflux esophagitis    Sleep apnea    No CPAP   Vaginal discharge 03/02/2020    Family History  Problem Relation Age of Onset   Hypertension Mother    Diabetes Mother    Heart disease Mother    Thyroid  disease Mother    Glaucoma Mother    Diabetes Father    Hypertension Sister    Diabetes Sister    Diabetes Sister    Hypertension Sister    Sleep apnea Sister    Hypertension Sister    Diabetes Sister    Hypertension Brother    Diabetes Brother    Pancreatic cancer Maternal Uncle    Heart attack Maternal Grandmother    Heart attack Maternal Grandfather    Mental illness Son    Mental illness Son    Pancreatic cancer Other        family history   Colon cancer Neg Hx     Past Surgical History:  Procedure Laterality Date   COLONOSCOPY     ECTOPIC PREGNANCY SURGERY     EXPLORATORY LAPAROTOMY  2000   x2 (Dr. Shanda Muscat)   HERPES SIMPLEX VIRUS DFA     TUBAL LIGATION  1987   Social History    Occupational History   Not on file  Tobacco Use   Smoking status: Former    Current packs/day: 0.00    Average packs/day: 0.3 packs/day for 36.0 years (9.0 ttl pk-yrs)    Types: Cigarettes    Start date: 05/18/1986    Quit date: 05/18/2022    Years since quitting: 1.9   Smokeless tobacco: Never  Vaping Use   Vaping status: Never Used  Substance and Sexual Activity   Alcohol  use: Yes    Comment: rarely   Drug use: Yes    Types: Marijuana    Comment: daily, smokes it   Sexual activity: Never

## 2024-05-20 ENCOUNTER — Inpatient Hospital Stay (HOSPITAL_COMMUNITY): Admission: RE | Admit: 2024-05-20 | Source: Ambulatory Visit

## 2024-05-29 ENCOUNTER — Ambulatory Visit: Payer: Self-pay

## 2024-05-29 NOTE — Telephone Encounter (Signed)
 Called pt - stated she had a sore throat x 3 day then a dry cough, sometimes a clear phlegm as stated in CRM's note. Stated she had covid 2 times; unsure if she may have it again. No appts today. D/t changed in her insurance too expensive to go to UC. First available appt given to pt - Tuesday 10/14 with Dr Harrie - pt stated if she feels better over the w/e, she will cancel this appt on Monday.

## 2024-05-29 NOTE — Telephone Encounter (Signed)
 FYI Only or Action Required?: Action required by provider: request for appointment.  Patient was last seen in primary care on 03/26/2024 by Edgardo Pontiff, DO.  Called Nurse Triage reporting Cough.  Symptoms began yesterday.  Interventions attempted: Rest, hydration, or home remedies.  Symptoms are: unchanged. Asking to be worked in Monday, please advise pt.  Triage Disposition: See Physician Within 24 Hours  Patient/caregiver understands and will follow disposition?: Yes     Copied from CRM [id number]. Topic: Clinical - Red Word Triage >> May 29, 2024  9:03 AM Graeme ORN wrote: Red Word that prompted transfer to Nurse Triage: Pain in side middle of body unsure if lungs or chest - recovering from sore throat - clear phlegm. Reason for Disposition  [1] Continuous (nonstop) coughing interferes with work or school AND [2] no improvement using cough treatment per Care Advice  Answer Assessment - Initial Assessment Questions 1. ONSET: When did the cough begin?      Monday 2. SEVERITY: How bad is the cough today?      severe 3. SPUTUM: Describe the color of your sputum (e.g., none, dry cough; clear, white, yellow, green)     clear 4. HEMOPTYSIS: Are you coughing up any blood? If Yes, ask: How much? (e.g., flecks, streaks, tablespoons, etc.)     no 5. DIFFICULTY BREATHING: Are you having difficulty breathing? If Yes, ask: How bad is it? (e.g., mild, moderate, severe)      no 6. FEVER: Do you have a fever? If Yes, ask: What is your temperature, how was it measured, and when did it start?     no 7. CARDIAC HISTORY: Do you have any history of heart disease? (e.g., heart attack, congestive heart failure)      no 8. LUNG HISTORY: Do you have any history of lung disease?  (e.g., pulmonary embolus, asthma, emphysema)     COPD 9. PE RISK FACTORS: Do you have a history of blood clots? (or: recent major surgery, recent prolonged travel, bedridden)     no 10. OTHER  SYMPTOMS: Do you have any other symptoms? (e.g., runny nose, wheezing, chest pain)       Chest pain 11. PREGNANCY: Is there any chance you are pregnant? When was your last menstrual period?       no 12. TRAVEL: Have you traveled out of the country in the last month? (e.g., travel history, exposures)       no  Protocols used: Cough - Acute Non-Productive-A-AH

## 2024-06-02 ENCOUNTER — Other Ambulatory Visit: Payer: Self-pay | Admitting: Physician Assistant

## 2024-06-02 ENCOUNTER — Encounter: Payer: Self-pay | Admitting: Student

## 2024-06-02 ENCOUNTER — Ambulatory Visit (INDEPENDENT_AMBULATORY_CARE_PROVIDER_SITE_OTHER): Admitting: Student

## 2024-06-02 VITALS — BP 106/75 | HR 92 | Temp 98.7°F | Ht 65.0 in | Wt 172.8 lb

## 2024-06-02 DIAGNOSIS — J40 Bronchitis, not specified as acute or chronic: Secondary | ICD-10-CM | POA: Diagnosis not present

## 2024-06-02 DIAGNOSIS — R079 Chest pain, unspecified: Secondary | ICD-10-CM

## 2024-06-02 DIAGNOSIS — Z8249 Family history of ischemic heart disease and other diseases of the circulatory system: Secondary | ICD-10-CM | POA: Diagnosis not present

## 2024-06-02 DIAGNOSIS — Z87891 Personal history of nicotine dependence: Secondary | ICD-10-CM

## 2024-06-02 LAB — EKG 12-LEAD

## 2024-06-02 MED ORDER — GUAIFENESIN-CODEINE 100-10 MG/5ML PO SOLN: 5.0000 mL | Freq: Three times a day (TID) | ORAL | 0 refills | Status: DC | PRN

## 2024-06-02 MED ORDER — GUAIFENESIN ER 600 MG PO TB12: 600.0000 mg | ORAL_TABLET | Freq: Two times a day (BID) | ORAL | 0 refills | Status: DC

## 2024-06-02 NOTE — Progress Notes (Signed)
   CC: cough x 8 days  HPI:  [name] is a 63 y.o. female with a PMH stated below who presents today for acute evaluation.  Please see problem based assessment and plan for additional details.  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 01/18/2022   Sensation of food items getting stuck in upper esophagus to be evaluated first by esophagram and then by EGD with Dr. Avram over the next few weeks.   Hypercholesterolemia    Morphea 2022   Nodule of skin of breast 01/04/2021   Obesity    OSA (obstructive sleep apnea) 12/27/2015   Palpitations    negative cardiac w/u per Dr. Edith 2/09   Reflux esophagitis    Sleep apnea    No CPAP   Vaginal discharge 03/02/2020    Review of Systems: ROS negative except for what is noted on the assessment and plan.  Vitals:   06/02/24 0959  BP: 106/75  Pulse: 92  Temp: 98.7 F (37.1 C)  TempSrc: Oral  SpO2: 95%  Weight: 172 lb 12.8 oz (78.4 kg)  Height: 5' 5 (1.651 m)    Physical Exam: Constitutional: well-appearing woman in no acute distress HENT: normocephalic atraumatic, mucous membranes moist Eyes: conjunctiva non-erythematous Cardiovascular: regular rate and rhythm, no m/r/g Pulmonary/Chest: normal work of breathing on room air, lungs clear to auscultation bilaterally Abdominal: soft, non-tender, non-distended MSK: normal bulk and tone Skin: warm and dry Psych: normal mood and behavior  Assessment & Plan:   Patient discussed with Dr. CHARLENA [name] Assessment & Plan Bronchitis One week of cough with some white mucus after sore throat. No fevers, chills, myalgia, and on exam her lungs are clear. Vital stable. Suspect  viral process. Will treat supportively. - Guaifenesin -codeine syrup TID prn x 1 week Chest pain at rest As part of her above concern, she noted chest pain. She feels very strongly it is related to her lungs, noting this happens before. Described pain as at rest, not worsened with breathing, feels like inflammation but would not clarify quality further. History CAD with atypical chest pain and sees a cardiologist. Offered EKG but patient refused. Precautions given for continuance of CP.  Nicole [name], D.O. [facility] Internal Medicine, PGY-2 Phone: [phone] Date 06/02/2024 Time 10:58 AM

## 2024-06-03 NOTE — Progress Notes (Signed)
 Internal Medicine Clinic Attending  Case discussed with the resident at the time of the visit.  We reviewed the resident's history and exam and pertinent patient test results.  I agree with the assessment, diagnosis, and plan of care documented in the resident's note.

## 2024-06-30 ENCOUNTER — Other Ambulatory Visit: Payer: Self-pay | Admitting: Physician Assistant

## 2024-07-03 ENCOUNTER — Other Ambulatory Visit (HOSPITAL_COMMUNITY)
Admission: RE | Admit: 2024-07-03 | Discharge: 2024-07-03 | Disposition: A | Discharge status: Home / Self Care | Source: Ambulatory Visit | Attending: Obstetrics and Gynecology | Admitting: Obstetrics and Gynecology

## 2024-07-03 ENCOUNTER — Encounter: Payer: Self-pay | Admitting: Obstetrics and Gynecology

## 2024-07-03 ENCOUNTER — Ambulatory Visit (INDEPENDENT_AMBULATORY_CARE_PROVIDER_SITE_OTHER): Admitting: Obstetrics and Gynecology

## 2024-07-03 VITALS — BP 120/78 | HR 73 | Wt 175.5 lb

## 2024-07-03 DIAGNOSIS — N958 Other specified menopausal and perimenopausal disorders: Secondary | ICD-10-CM

## 2024-07-03 DIAGNOSIS — Z7251 High risk heterosexual behavior: Secondary | ICD-10-CM

## 2024-07-03 DIAGNOSIS — F4323 Adjustment disorder with mixed anxiety and depressed mood: Secondary | ICD-10-CM

## 2024-07-03 DIAGNOSIS — Z113 Encounter for screening for infections with a predominantly sexual mode of transmission: Secondary | ICD-10-CM

## 2024-07-03 DIAGNOSIS — M791 Myalgia, unspecified site: Secondary | ICD-10-CM

## 2024-07-03 NOTE — Patient Instructions (Addendum)
 Use vaginal estrogen nightly for 2 weeks and then twice a week afterwards. It takes about 2 months to see improvement. Use lubricant when engaging in sexual intercourse.

## 2024-07-03 NOTE — Addendum Note (Signed)
 Addended by: Kimi Bordeau O on: 07/03/2024 09:09 AM   Modules accepted: Orders

## 2024-07-03 NOTE — Progress Notes (Signed)
 GYNECOLOGY VISIT  Patient name: Nicole [name] MRN [id number]  Date of birth: [birth year] Chief Complaint:   Gynecologic Exam  History:  Glenys CHRISTELLA Probert presents with concern for rash after sexual encounter with partner with reported coating on tongue - likely yeast as had to use oral rinse. Engaged in kissing and receptive oral intercourse. This encounter was earlier this week. Has been having vulvar itching without vaginal discharge. She is concerned it may be on her tongue as well. Ok with STI testing today. Notes rash at entry of vagina. Occasionally will have pain and tearing with intercourse. Uses lubricant with intercourse due to experiencing vaginal dryness. Previously prescribed vaginal estrogen but has not been using it.   She also expresses interest in plastic surgery for her labia as she feels they appear less full and would like the excess skin removed if possible.       The following portions of the patient's history were reviewed and updated as appropriate: allergies, current medications, past family history, past medical history, past social history, past surgical history and problem list.   Health Maintenance:   Last pap     Component Value Date/Time   DIAGPAP  06/24/2020 1138    - Negative for intraepithelial lesion or malignancy (NILM)   DIAGPAP  02/08/2017 0000    NEGATIVE FOR INTRAEPITHELIAL LESIONS OR MALIGNANCY.   HPVHIGH Negative 06/24/2020 1138   ADEQPAP  06/24/2020 1138    Satisfactory for evaluation; transformation zone component PRESENT.   ADEQPAP  02/08/2017 0000    Satisfactory for evaluation  endocervical/transformation zone component PRESENT.    Health Maintenance  Topic Date Due   COVID-19 Vaccine (1) Never done   Pneumococcal Vaccine for age over 24 (1 of 2 - PCV) Never done   Zoster (Shingles) Vaccine (1 of 2) Never done   DTaP/Tdap/Td vaccine (3 - Td or Tdap) 10/28/2022   Breast Cancer Screening  08/25/2023   Medicare Annual Wellness Visit   09/10/2024   Pap with HPV screening  06/24/2025   Colon Cancer Screening  01/19/2027   Hepatitis C Screening  Completed   HIV Screening  Completed   Hepatitis B Vaccine  Aged Out   HPV Vaccine  Aged Out   Meningitis B Vaccine  Aged Out   Flu Shot  Discontinued      Review of Systems:  Pertinent items are noted in HPI. Comprehensive review of systems was otherwise negative.   Objective:  Physical Exam BP 120/78   Pulse 73   Wt 175 lb 8 oz (79.6 kg)   LMP 03/03/2016 (Approximate)   BMI 29.20 kg/m    Physical Exam Vitals and nursing note reviewed. Exam conducted with a chaperone present.  Constitutional:      Appearance: Normal appearance.  HENT:     Head: Normocephalic and atraumatic.  Pulmonary:     Effort: Pulmonary effort is normal.     Breath sounds: Normal breath sounds.  Genitourinary:    General: Normal vulva.     Exam position: Lithotomy position.     Vagina: Normal.     Cervix: Normal.     Comments: No rashes noted on vulva or introitus Atrophic vaginal changes  Allodynia of vaginal introitus from 3 to 6 o'clock  Skin:    General: Skin is warm and dry.  Neurological:     General: No focal deficit present.     Mental Status: She is alert.  Psychiatric:  Mood and Affect: Mood normal.        Behavior: Behavior normal.        Thought Content: Thought content normal.        Judgment: Judgment normal.        Assessment & Plan:  1. Screening examination for STI (Primary) 2. Unprotected sexual intercourse Vaginitis swab collected. Possible exposure to candida from partner. Recommend follow up with PCP if there are continue oral conerns.  - Cervicovaginal ancillary only( [place]) - RPR+HBsAg+HCVAb+...  3. 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 Given uberlube samples. Reports still has vaginal estrace - instructed to use daily x 2 weeks then 2x a week thereafter to improve vaginal moisture, reduce incidence of UTI and  reduce dyspareunia. Safe to engage in sexual intercourse while using vaginal estrogen.      Carter Quarry, MD [facility] for [facility], [facility]

## 2024-07-03 NOTE — Addendum Note (Signed)
 Addended by: LENNIE RONCO T on: 07/03/2024 09:05 AM   Modules accepted: Orders

## 2024-07-06 ENCOUNTER — Ambulatory Visit: Payer: Self-pay | Admitting: Obstetrics and Gynecology

## 2024-07-06 DIAGNOSIS — B3731 Acute candidiasis of vulva and vagina: Secondary | ICD-10-CM

## 2024-07-06 LAB — CERVICOVAGINAL ANCILLARY ONLY
Bacterial Vaginitis (gardnerella): NEGATIVE
Candida Glabrata: NEGATIVE
Candida Vaginitis: POSITIVE — AB
Chlamydia: NEGATIVE
Comment: NEGATIVE
Comment: NEGATIVE
Comment: NEGATIVE
Comment: NEGATIVE
Comment: NEGATIVE
Comment: NORMAL
Neisseria Gonorrhea: NEGATIVE
Trichomonas: NEGATIVE

## 2024-07-06 MED ORDER — FLUCONAZOLE 150 MG PO TABS: 150.0000 mg | ORAL_TABLET | Freq: Once | ORAL | 1 refills | Status: AC

## 2024-07-07 NOTE — Telephone Encounter (Addendum)
 RN called pt to inform her of yeast results from vaginal swab and Diflucan  sent to her pharmacy.  Advised pt that provider sent a refill incase symptoms do not improve after three days she may take a second dose.  Pt verbalized understanding and had no further questions at this time.    [place], RN   ----- Message from Carter Quarry sent at 07/06/2024  6:00 PM EST ----- Notify that swab confirms presence of yeast (candida) - diflucan  sent to pharmacy  ----- Message ----- From: Interface, Lab In [facility] Sent: 07/06/2024   4:18 PM EST To: Carter Quarry, MD

## 2024-07-09 ENCOUNTER — Telehealth: Payer: Self-pay | Admitting: *Deleted

## 2024-07-09 NOTE — Telephone Encounter (Signed)
 Patient contacted regarding her calling to schedule her mammogram- patient  states she is not going to schedule appointment because she not having them mashing her breast until she get stuff out of her system.  Breast center had contacted her also and patient was to call back to schedule appointment.

## 2024-07-13 ENCOUNTER — Ambulatory Visit (HOSPITAL_COMMUNITY): Admit: 2024-07-13 | Admitting: General Surgery

## 2024-07-13 ENCOUNTER — Ambulatory Visit: Admitting: Physician Assistant

## 2024-07-13 SURGERY — REPAIR, HERNIA, VENTRAL, ROBOT-ASSISTED
Anesthesia: General

## 2024-07-13 NOTE — Progress Notes (Deleted)
  Cardiology Office Note   Date:  07/13/2024  ID:  Creasie, Lacosse [birth year], MRN [id number] PCP: Trudy Mliss Dragon, MD  [facility] Providers Cardiologist:  Lynwood Schilling, MD   History of Present Illness Nicole [name] is a 63 y.o. female with a past medical history of mild CAD, chest pain, anxiety. Presents for a 6 month follow up appointment   Patient had previously had an ETT in 08/2015 that showed no evidence of ischemia. ETT in 03/2022 was a normal treadmill test.   Patient most recently underwent coronary CTA in 07/2023 that showed mild CAD. Coronary calcium score was 93 (88th percentile).   Chest pain  - Patient has a history of noncardiac chest pain. Has had 2 normal ETTs. Coronary CTA in 07/2023 showed mild CAD  -   ROS: ***  Studies Reviewed      *** Risk Assessment/Calculations {Does this patient have ATRIAL FIBRILLATION?[phone]} No BP recorded.  {Refresh Note OR Click here to enter BP  :1}***       Physical Exam VS:  LMP 03/03/2016 (Approximate)        Wt Readings from Last 3 Encounters:  07/03/24 175 lb 8 oz (79.6 kg)  06/02/24 172 lb 12.8 oz (78.4 kg)  05/01/24 172 lb (78 kg)    GEN: Well nourished, well developed in no acute distress NECK: No JVD; No carotid bruits CARDIAC: ***RRR, no murmurs, rubs, gallops RESPIRATORY:  Clear to auscultation without rales, wheezing or rhonchi  ABDOMEN: Soft, non-tender, non-distended EXTREMITIES:  No edema; No deformity   ASSESSMENT AND PLAN ***    {Are you ordering a CV Procedure (e.g. stress test, cath, DCCV, TEE, etc)?   Press F2        :[id number]}  Dispo: ***  Signed, Rollo FABIENE Louder, PA-C

## 2024-07-14 ENCOUNTER — Ambulatory Visit: Admitting: Cardiology

## 2024-07-15 ENCOUNTER — Encounter: Payer: Self-pay | Admitting: General Practice

## 2024-07-15 ENCOUNTER — Ambulatory Visit: Attending: General Practice | Admitting: Physician Assistant

## 2024-07-15 VITALS — BP 128/60 | HR 81 | Ht 65.5 in | Wt 178.2 lb

## 2024-07-15 DIAGNOSIS — E785 Hyperlipidemia, unspecified: Secondary | ICD-10-CM

## 2024-07-15 DIAGNOSIS — I2510 Atherosclerotic heart disease of native coronary artery without angina pectoris: Secondary | ICD-10-CM

## 2024-07-15 DIAGNOSIS — M7989 Other specified soft tissue disorders: Secondary | ICD-10-CM

## 2024-07-15 DIAGNOSIS — R0602 Shortness of breath: Secondary | ICD-10-CM

## 2024-07-15 DIAGNOSIS — Z720 Tobacco use: Secondary | ICD-10-CM

## 2024-07-15 DIAGNOSIS — M79662 Pain in left lower leg: Secondary | ICD-10-CM | POA: Diagnosis not present

## 2024-07-15 DIAGNOSIS — R0789 Other chest pain: Secondary | ICD-10-CM | POA: Diagnosis not present

## 2024-07-15 NOTE — Progress Notes (Signed)
 Cardiology Office Note:  .   Date:  07/15/2024  ID:  SHELITHA MAGLEY, DOB 02/27/1961, MRN [id number] PCP: Trudy Mliss Dragon, MD  [facility] Providers Cardiologist:  Lynwood Schilling, MD {  History of Present Illness: .   Kineta Fudala Getter is a 63 y.o. female  with PMHx of Nonobstructive CAD, HLD, tobacco use, OSA,  bipolar disorder, anxiety, depression who reports to [facility] office for follow up.   Nonobstructive CAD ETT 08/2015 and 03/2022: negative, no ischemia  CCTA 07/2023: CAC score of 93 at 88 th percentile. Mild CAD 25-49% stenosis  HLD Tobacco use OSA Sleep Study 2015: Diagnosed with ,moderate OSA requiring CPAP Sleep Study 03/2022: negative for sleep apnea, no CPAP recommended  Last seen in heartcare 02/2024 by Dr. Santo. Reported chest pain associated with smoking. Also noted poor diet with high cholesterol foods. Previously smoked marijuana but recently stopped due to safety concern. Suspect due to atypical chest pain. Offered cardiac PET but patient declined. Order PFT. Discussed lifestyle modifications.  Today, reports bilateral leg swelling with left worse than right x 1-2 weeks that improves with leg elevation after 15 minutes. She states her BP usually runs low and she eats high sodium food to keep BP normal. Also reports worsening SOB x 1 month associated with using steps and longer distances. Denies chest pain, palpitations, syncope, presyncope, dizziness, orthopnea, PND, significant weight changes, acute bleeding, or claudication.  Reports compliance with medications. She is very active and able to walk between 3-6 miles at a time and has to take breaks due to SOB. She smoke 1-2 cigarettes per day and marijuana 1-2 times per day.   ROS: 10 point review of system has been reviewed and considered negative except ones been listed in the HPI.   Studies Reviewed: SABRA   CCTA 07/2023 IMPRESSION: 1. Mild CAD, 25-49% stenosis, CADRADS 2. 2. Total plaque volume 138 mm3  which is 57th percentile for age- and sex-matched controls (calcified plaque 17 mm3; non-calcified plaque 121 mm3). TPV is moderate. 3. Coronary calcium score of 93. This was 88th percentile for age and sex matched control. 4. Normal coronary origins with right dominance.   RECOMMENDATIONS: CAD-RADS 2. Mild non-obstructive CAD (25-49%). Consider non-atherosclerotic causes of chest pain. Consider preventive therapy and risk factor modification.  Physical Exam:   VS:  BP 128/60   Pulse 81   Ht 5' 5.5 (1.664 m)   Wt 178 lb 3.2 oz (80.8 kg)   LMP 03/03/2016 (Approximate)   SpO2 92%   BMI 29.20 kg/m    Wt Readings from Last 3 Encounters:  07/15/24 178 lb 3.2 oz (80.8 kg)  07/03/24 175 lb 8 oz (79.6 kg)  06/02/24 172 lb 12.8 oz (78.4 kg)    GEN: Well nourished, well developed in no acute distress while sitting in chair.  NECK: No JVD; No carotid bruits CARDIAC: RRR, no murmurs, rubs, gallops RESPIRATORY:  Clear to auscultation without rales, wheezing or rhonchi  ABDOMEN: Soft, non-tender, non-distended EXTREMITIES:  No edema; No deformity, pain with palpation in left leg but no signs of redness, swelling, or warmth.   ASSESSMENT AND PLAN: .   Coronary artery disease involving native coronary artery of native heart without angina pectoris Atypical chest pain Hyperlipidemia LDL goal <70 Patient has a history of noncardiac chest pain. Has had 2 normal ETTs. Coronary CTA in 07/2023 showed mild CAD .  Denies angina symptoms. No need for further ischemic evaluation at this time. If recurrent CP in the  future, then would consider Cardiac PET as previously suggested by MD.  LDL 163 in 08/2022. Pending FLP not completed and expired. Order direct LDL.  She prefers natural remedies and dietary changes over medication. She is not interested in statin medication at this time.  Continue ASA 81 mg  SOB  Left leg swelling and pain  Normal Spirometry in 04/2024 and Normal PFT in 02/2024.  Reports  SOB with longer distances and steps x 1 month and bilateral leg swelling with left worse than right x 1-2 weeks that improves with leg elevation after 15 minutes. On exam, pain with palpation in left leg but no signs of redness, swelling, or warmth.  Order D-dimer. Order Vascular US  for left LE.  Order ECHO.  Could be secondary to high sodium diet. Encouraged to reduce sodium intake.   Tobacco abuse Currently smokes 1-2 cigarettes per day.  Dicussed cessation but no desire for cessation      Dispo: Follow up in 6-8 weeks with any APP.   Signed, Lorette CINDERELLA Kapur, PA-C

## 2024-07-15 NOTE — Patient Instructions (Addendum)
 Thank you for choosing [facility]!     Medication Instructions:  No medication changes were made during today's visit.  *If you need a refill on your cardiac medications before your next appointment, please call your pharmacy*   Lab Work: D-DIMER, DIRECT LDL  If you have labs (blood work) drawn today and your tests are completely normal, you will receive your results only by: MyChart Message (if you have MyChart) OR A paper copy in the mail If you have any lab test that is abnormal or we need to change your treatment, we will call you to review the results.   Testing/Procedures: Your physician has requested that you have an echocardiogram. Echocardiography is a painless test that uses sound waves to create images of your heart. It provides your doctor with information about the size and shape of your heart and how well your heart's chambers and valves are working. This procedure takes approximately one hour. There are no restrictions for this procedure. Please do NOT wear cologne, perfume, aftershave, or lotions (deodorant is allowed). Please arrive 15 minutes prior to your appointment time.  Please note: We ask at that you not bring children with you during ultrasound (echo/ vascular) testing. Due to room size and safety concerns, children are not allowed in the ultrasound rooms during exams. Our front office staff cannot provide observation of children in our lobby area while testing is being conducted. An adult accompanying a patient to their appointment will only be allowed in the ultrasound room at the discretion of the ultrasound technician under special circumstances. We apologize for any inconvenience.     Your physician has requested that you have a lower extremity arterial exercise duplex. During this test, exercise and ultrasound are used to evaluate arterial blood flow in the legs. Allow one hour for this exam. There are no restrictions or special instructions.     Your next appointment:    6-8 week(s)   Provider:   Josefa Beauvais, NP           Follow-Up: At [facility], you and your health needs are our priority.  As part of our continuing mission to provide you with exceptional heart care, we have created designated Provider Care Teams.  These Care Teams include your primary Cardiologist (physician) and Advanced Practice Providers (APPs -  Physician Assistants and Nurse Practitioners) who all work together to provide you with the care you need, when you need it. We recommend signing up for the patient portal called MyChart.  Sign up information is provided on this After Visit Summary.  MyChart is used to connect with patients for Virtual Visits (Telemedicine).  Patients are able to view lab/test results, encounter notes, upcoming appointments, etc.  Non-urgent messages can be sent to your provider as well.   To learn more about what you can do with MyChart, go to [email or web address].

## 2024-07-16 LAB — D-DIMER, QUANTITATIVE: D-DIMER: <0.2 mg{FEU}/L (ref 0.00–0.49)

## 2024-07-16 LAB — LDL CHOLESTEROL, DIRECT: LDL Direct: 149 mg/dL — ABNORMAL HIGH (ref 0–99)

## 2024-07-20 ENCOUNTER — Ambulatory Visit: Admitting: Physician Assistant

## 2024-07-20 ENCOUNTER — Encounter: Payer: Self-pay | Admitting: Pulmonary Disease

## 2024-07-20 ENCOUNTER — Ambulatory Visit: Admitting: Pulmonary Disease

## 2024-07-20 VITALS — BP 123/82 | HR 75 | Temp 97.7°F | Ht 65.0 in | Wt 173.0 lb

## 2024-07-20 DIAGNOSIS — F17210 Nicotine dependence, cigarettes, uncomplicated: Secondary | ICD-10-CM

## 2024-07-20 DIAGNOSIS — R079 Chest pain, unspecified: Secondary | ICD-10-CM

## 2024-07-20 DIAGNOSIS — Z77120 Contact with and (suspected) exposure to mold (toxic): Secondary | ICD-10-CM

## 2024-07-20 DIAGNOSIS — R0600 Dyspnea, unspecified: Secondary | ICD-10-CM

## 2024-07-20 DIAGNOSIS — K219 Gastro-esophageal reflux disease without esophagitis: Secondary | ICD-10-CM | POA: Diagnosis not present

## 2024-07-20 DIAGNOSIS — G4733 Obstructive sleep apnea (adult) (pediatric): Secondary | ICD-10-CM

## 2024-07-20 DIAGNOSIS — F1290 Cannabis use, unspecified, uncomplicated: Secondary | ICD-10-CM

## 2024-07-20 NOTE — Patient Instructions (Signed)
  VISIT SUMMARY: During your visit, we discussed your concerns about lung health, potential mold exposure, and other related symptoms. We also reviewed your smoking habits and gastrointestinal issues.  YOUR PLAN: EXPOSURE TO MOLD IN HOME ENVIRONMENT WITH EVALUATION FOR HYPERSENSITIVITY PNEUMONITIS: You have been exposed to mold in your home, which may be causing lung inflammation and other respiratory symptoms. -We have ordered a CT scan of your chest to check for hypersensitivity pneumonitis. -It is important to relocate from the mold-infested environment as soon as possible.  TOBACCO AND MARIJUANA USE: You are currently smoking marijuana and have plans to quit by December 2025. Smoking may be contributing to your respiratory symptoms. -We encourage you to stop smoking both tobacco and marijuana to improve your lung health.  GASTROESOPHAGEAL REFLUX DISEASE AND ESOPHAGEAL DYSMOTILITY: You have a history of acid reflux and esophageal dysmotility, which may be causing chest pain. -Continue your follow-up with Dr. Avram on January 7th for further evaluation and management of your gastrointestinal issues.

## 2024-07-20 NOTE — Progress Notes (Addendum)
 ALEA RYER    [id number]    09-Sep-1960  Primary Care Physician:Williams, Mliss Dragon, MD  Referring Physician: Trudy Mliss Dragon, MD [address] [place], [address] [place],  [place] [address]  Chief complaint: Follow-up for lung pain, concern for interstitial lung disease  HPI: 63 y.o.  with history of GERD, depression, sleep apnea Complains of throat irritation, chest tightness, lung pain.  This usually occurs after she smokes marijuana.  No cough, sputum production, fevers, chills.  Had a negative D-dimer in 2023  Evaluated by Dr. Carlie, ENT with no abnormalities noted.  She was advised to quit smoking.  She had a skin biopsy done by [facility] for cutaneous lesions and diagnosed with limited scleroderma, morphea.   She has since followed up with Dr. Jeannetta, rheumatology in 2023 with serologies sent which are negative for scleroderma.  She is seen a different dermatologist at [facility] who did not see any evidence of morphea and was given a diagnosis of Lipodermatosclerosis of both lower extremities   Also follows with GI for dysphagia.  Barium swallow 2023 with mild dysmotility and reflux  Interim history: Discussed the use of AI scribe software for clinical note transcription with the patient, who gave verbal consent to proceed.  History of Present Illness ALIJAH HYDE is a 63 year old female who presents with concerns about lung health and potential mold exposure.  Environmental mold exposure - Severe mold exposure in home following a leak, with mold present on clothes, furniture, and bedding. - Actively attempting to relocate due to ongoing exposure.  Substance use: marijuana smoking - Current marijuana smoker, believes smoking contributes to respiratory symptoms. - In process of quitting marijuana, with goal to be smoke-free by December 2025.  Gastrointestinal symptoms - History of acid reflux and esophageal dysmotility. - Chest pain attributed  to gastrointestinal symptoms. - Follow-up with gastroenterology is planned.   Relevant pulmonary history: Pets: No pets Occupation: She did odd jobs.  Currently on disability Exposures: Reports mold at home.  No hot tub, Jacuzzi or down close or comforters Smoking history: Smokes marijuana every day and an occasional cigarette Travel history: No significant travel history Relevant family history: No significant family history of lung disease  Outpatient Encounter Medications as of 07/20/2024  Medication Sig   alendronate (FOSAMAX) 70 MG tablet Take 70 mg by mouth once a week. (Patient not taking: Reported on 07/15/2024)   aspirin  EC 81 MG tablet Take 81 mg by mouth daily. Swallow whole. (Patient not taking: Reported on 07/15/2024)   azelastine  (ASTELIN ) 0.1 % nasal spray Place 1-2 sprays into both nostrils 2 (two) times daily as needed (nasal drainage). Use in each nostril as directed (Patient not taking: Reported on 07/15/2024)   Cholecalciferol 50 MCG (2000 UT) CAPS Take 2,000 Units by mouth daily at 6 (six) AM. (Patient not taking: Reported on 07/15/2024)   estradiol (ESTRACE) 0.1 MG/GM vaginal cream as needed. (Patient not taking: Reported on 07/15/2024)   guaiFENesin -codeine  100-10 MG/5ML syrup Take 5 mLs by mouth 3 (three) times daily as needed for cough. (Patient not taking: Reported on 07/15/2024)   meloxicam  (MOBIC ) 7.5 MG tablet TAKE 1 TABLET(7.5 MG) BY MOUTH DAILY (Patient not taking: Reported on 07/15/2024)   No facility-administered encounter medications on file as of 07/20/2024.   Vitals:   07/20/24 1124  BP: 123/82  Pulse: 75  Temp: 97.7 F (36.5 C)  Height: 5' 5 (1.651 m)  Weight: 173 lb (78.5 kg)  SpO2: 97%  TempSrc: Oral  BMI (Calculated): 28.79     Physical Exam GEN: No acute distress CV: Regular rate and rhythm no murmurs LUNGS: Clear to auscultation bilaterally normal respiratory effort SKIN JOINTS: Warm and dry no rash    Data Reviewed: Imaging: Chest  x-ray 04/25/2019-no active disease.  Chest x-ray 01/14/2020-no acute cardiopulmonary disease High-resolution CT 11/16/2021-no evidence of interstitial lung disease, bland minimal scarring bilaterally.  Coronary artery disease. I have reviewed the images personally  PFTs: 12/14/2021 FVC 3.06 [112%], FEV1 2.47 [115%], F/F 81, TLC 5.13 [98%], DLCO 17.75 [85%] Normal test  04/30/2024 FVC 2.75 [82%], FEV1 2.23 [87%], F/F81, TLC 4.88 [93%], DLCO 15.75 [76%] Normal test  Labs: CBC 10/21/2018-WBC 4.3, eos 2.7%, absolute eosinophil count 159 CBC 04/07/2021-WBC 4.5, eos 4.2%, absolute eosinophil count 189  IgE 01/14/2020-36  Hypersensitivity panel 01/14/2020-negative  Skin biopsy 06/13/2021 Sclerotic collagen with elongated fibroblasts.  Morphea.  Assessment & Plan Exposure to mold in home environment with evaluation for hypersensitivity pneumonitis Recent exposure to mold in the home environment with potential hypersensitivity pneumonitis. Lung function tests show a slight decrease in diffusion capacity and lung volumes compared to two years ago, but still within normal range. Symptoms include respiratory inflammation, possibly exacerbated by mold exposure. Differential diagnosis includes hypersensitivity pneumonitis due to mold exposure. Initially had a diagnosis of morphea but work-up has been negative for scleroderma.  PFTs normal - Ordered CT of the chest to evaluate for hypersensitivity pneumonitis.  Previous CT in 2023 has been negative - Advised relocation from mold-infested environment  Atypical chest discomfort Atypical presentation this is likely related to her ongoing marijuana smoking.  D-dimer is negative ruling out venous thromboembolism Has been evaluated by cardiology with no evidence of cardiac ischemia.  Tobacco and marijuana use Continued use of tobacco and marijuana, with plans to quit smoking by age 16. Smoking may contribute to respiratory symptoms and lung inflammation. -  Encouraged cessation of smoking and marijuana use  Gastroesophageal reflux disease and esophageal dysmotility Gastroesophageal reflux disease and esophageal dysmotility with symptoms potentially contributing to chest pain. Upcoming appointment with Dr. Avram for further evaluation and management. - Continue follow-up with Dr. Avram on January 7th for management of GERD and esophageal dysmotility  OSA Now following with [facility] neurology for management of OSA  Plan/Recommendations: Chest x-ray Return in 6 months  Lonna Coder MD [facility] 07/20/2024, 11:30 AM  CC: Trudy Mliss Dragon, MD

## 2024-07-22 ENCOUNTER — Ambulatory Visit: Payer: Self-pay | Admitting: Physician Assistant

## 2024-07-24 ENCOUNTER — Ambulatory Visit (HOSPITAL_COMMUNITY)

## 2024-07-27 ENCOUNTER — Ambulatory Visit
Admission: RE | Admit: 2024-07-27 | Discharge: 2024-07-27 | Disposition: A | Source: Ambulatory Visit | Attending: Pulmonary Disease

## 2024-07-27 DIAGNOSIS — R0600 Dyspnea, unspecified: Secondary | ICD-10-CM

## 2024-07-27 NOTE — BH Specialist Note (Deleted)
 Integrated Behavioral Health via Telemedicine Visit  07/27/2024 Nicole [name] [id number]  Number of Integrated Behavioral Health Clinician visits: No data recorded Session Start time: No data recorded  Session End time: No data recorded Total time in minutes: No data recorded   Referring Provider: *** Patient/Family location: *** [facility] Provider location: *** All persons participating in visit: *** Types of Service: {CHL AMB TYPE OF SERVICE:[phone]}  I connected with Nicole [name] and/or Nicole [name] {family members:20773} via  Telephone or Video Enabled Telemedicine Application  (Video is Caregility application) and verified that I am speaking with the correct person using two identifiers. Discussed confidentiality: {YES/NO:21197}  I discussed the limitations of telemedicine and the availability of in person appointments.  Discussed there is a possibility of technology failure and discussed alternative modes of communication if that failure occurs.  I discussed that engaging in this telemedicine visit, they consent to the provision of behavioral healthcare and the services will be billed under their insurance.  Patient and/or legal guardian expressed understanding and consented to Telemedicine visit: {YES/NO:21197}  Presenting Concerns: Patient and/or family reports the following symptoms/concerns: *** Duration of problem: ***; Severity of problem: {Mild/Moderate/Severe:20260}  Patient and/or Family's Strengths/Protective Factors: {CHL AMB BH PROTECTIVE FACTORS:[phone]}  Goals Addressed: Patient will:  Reduce symptoms of: {IBH Symptoms:21014056}   Increase knowledge and/or ability of: {IBH Patient Tools:21014057}   Demonstrate ability to: {IBH Goals:21014053}  Progress towards Goals: {CHL AMB BH PROGRESS TOWARDS [phone]}    Interventions: Interventions utilized:  {IBH Interventions:21014054} Standardized Assessments completed: {IBH Screening  Tools:21014051}    Patient and/or Family Response: ***  Clinical Assessment/Diagnosis  No diagnosis found.    Assessment: Patient currently experiencing ***.   Patient may benefit from ***.  Plan: Follow up with behavioral health clinician on : *** Behavioral recommendations: *** Referral(s): {IBH Referrals:21014055}  I discussed the assessment and treatment plan with the patient and/or parent/guardian. They were provided an opportunity to ask questions and all were answered. They agreed with the plan and demonstrated an understanding of the instructions.   They were advised to call back or seek an in-person evaluation if the symptoms worsen or if the condition fails to improve as anticipated.  Tiyon Sanor C [place], LCSW

## 2024-07-28 NOTE — Progress Notes (Deleted)
 Office Visit Note  Patient: Nicole [name]             Date of Birth: Oct 28, 1960           MRN: [id number]             PCP: Trudy Mliss Dragon, MD Referring: Trudy Mliss Dragon, MD Visit Date: 08/10/2024   Subjective:  No chief complaint on file.   History of Present Illness: Nicole [name] is a 63 y.o. female here for follow up on shoulder pain and skin changes.   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 Nicole [name] is a 63 y.o. female here for follow up with ongoing joint pain particularly bothering her at the shoulders.    She has a history of shoulder pain, primarily on the right side, which was previously severe but has since improved. The left shoulder pain has also improved. She manages the pain with self-healing methods, anti-inflammatory foods, and occasionally uses topical treatments and heat.   She has been diagnosed with localized scleroderma, also known as morphea, which has caused skin changes including hair loss and indentations on her legs.  She saw Dr. Jorizzo in [place] and subsequently Greig Blower in [place] as specialists for the scleroderma.  But she was never able to follow-up since initial visit in February 2024.   She experiences redness and occasional pain in the tips of her fingers, which she associates with a past COVID-19 vaccine. The redness sometimes turns pale and cold, and she has developed rashes on her fingers. She does not take blood pressure medication and reports having low blood pressure. She states concern because her father had similar symptoms and eventually had his hands and feet amputated.      Previous HPI 11/29/2022 Nicole [name] is a 63 y.o. female here for follow up with ongoing joint pain particularly bothering her at the shoulders.  Since her last visit right shoulder pain is doing better but her left shoulder is currently  causing more problems.  Gets pain with lifting and pulling activities worst across the top of the shoulder area.  She did not end up using the topical Voltaren  at all because she was not very confident about the correct way to do so and prefers avoiding medications is much as possible.  Otherwise she is not experiencing any radiation of symptoms no increased joint pain or swelling elsewhere.  She was scheduled for bone density testing in August of this year.     Previous HPI 10/17/22 Nicole [name] is a 63 y.o. female here for follow up for the previous findings of morphea and also with joint pain in several areas most very currently in the right shoulder.  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  She has been recommended to use compression socks to minimize venous stasis but not on systemic medical therapy.  Otherwise has been doing pretty well overall though she has noticed some overall worsening with her energy and health since decreased physical activity.  She has been working on increasing this again but has right shoulder pain this initially started while walking.  Now it is becoming more painful when she tries to reach across the body and it somewhat alleviated with reaching overhead.  Symptoms  are not radiating anywhere she has no known history of serious injury to the shoulder. She is also concerned about bone density and interested in screening for osteoporosis.   Previous HPI 01/01/22 Nicole [name] is a 63 y.o. female here for morphea evaluation for possible systemic sclerosis. She has been diagnosed by dermatology biopsy of affected area on her leg. She has skin rashes in multiple areas she estimates for about 20 years in total not sure about when these have progressed to become more noticeable.  Is associated with loss of hair as well worst in  bilateral temporal areas and in her distal legs.  She has a few hyperpigmented skin patches and some areas of thickening or indentations.  Some hypopigmented areas as well but smaller spots and this is also been a scattered distribution including her torso.  She has noticed tightness or catching and tendons on the top of her feet that comes and goes feels like this takes several minutes until release and get back to normal range of movement.  She notices some swelling intermittently in her hands worse when walking and standing for prolonged time.  She saw pulmonology for assessment with no significant CT abnormalities. GI workup is planned for evaluating esophageal dysmotility. Concern is also for possible esophageal stricture or scarring related to chronic GERD.      No Rheumatology ROS completed.   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 01/18/2022   Sensation of food items getting stuck in upper esophagus to be evaluated first by esophagram and then by EGD with Dr. Avram over the next few weeks.   Hypercholesterolemia    Morphea 2022   Nodule of skin of breast 01/04/2021   Obesity    OSA (obstructive sleep apnea) 12/27/2015   Palpitations    negative cardiac w/u per Dr. Edith 2/09   Reflux esophagitis  Sleep apnea    No CPAP   Vaginal discharge 03/02/2020    Family History  Problem Relation Age of Onset   Hypertension Mother    Diabetes Mother    Heart disease Mother    Thyroid  disease Mother    Glaucoma Mother    Diabetes Father    Hypertension Sister    Diabetes Sister    Diabetes Sister    Hypertension Sister    Sleep apnea Sister    Hypertension Sister    Diabetes Sister    Hypertension Brother    Diabetes Brother    Pancreatic cancer Maternal Uncle    Heart attack Maternal Grandmother    Heart attack Maternal Grandfather    Mental illness Son    Mental illness Son    Pancreatic cancer Other        family history   Colon cancer Neg Hx    Past Surgical History:  Procedure Laterality Date   COLONOSCOPY     ECTOPIC PREGNANCY SURGERY     EXPLORATORY LAPAROTOMY  2000   x2 (Dr. Shanda Muscat)   HERPES SIMPLEX VIRUS DFA     TUBAL LIGATION  1987   Social History   Social History Narrative   Current Social History 02/16/2021         Patient lives alone in a [place] which is 2 stories. There are 13 steps with handrails up to the entrance the patient uses.       Patient's method of transportation is city bus.      The highest level of education was high school diploma.      The patient currently disabled.      Identified important Relationships are My mother, my sisters, children, grandchildren (5)       Pets : None       Interests / Fun: Read, watch TV, movies, social media, talk on phone, shopping, travel       Current Stressors: Simple people get on my nerves,;drama.       Religious / Personal Beliefs: Christian, [place], Pentecostal.       Other: I stay away from people.      FREDRIK Alcide, BSN, RN-BC              Immunization History  Administered Date(s) Administered   Td 10/18/2005   Tdap 10/27/2012     Objective: Vital Signs: LMP 03/03/2016 (Approximate)    Physical Exam   Musculoskeletal Exam: ***  CDAI Exam: CDAI Score: -- Patient Global: --; Provider Global: -- Swollen: --; Tender: -- Joint Exam 08/10/2024   No joint exam has been documented for this visit   There is currently no information documented on the homunculus. Go to the Rheumatology activity and complete the homunculus joint exam.  Investigation: No additional findings.  Imaging: CT CHEST HIGH RESOLUTION Result Date: 07/27/2024 CLINICAL DATA:  Interstitial lung disease, mold exposure. EXAM: CT CHEST WITHOUT CONTRAST TECHNIQUE: Multidetector CT imaging of the chest was performed following the standard protocol without intravenous contrast. High resolution imaging of the lungs, as well as inspiratory and expiratory imaging, was performed. RADIATION DOSE REDUCTION: This exam was performed according to the departmental dose-optimization program which includes automated exposure control, adjustment of the mA and/or kV according to patient size and/or use of iterative reconstruction technique. COMPARISON:   06/18/2023. FINDINGS: Cardiovascular: Atherosclerotic calcification of the aorta and left anterior descending coronary artery. Heart size normal. No pericardial effusion. Mediastinum/Nodes: No pathologically enlarged mediastinal or axillary lymph nodes.  Hilar regions are difficult to definitively evaluate without IV contrast. Esophagus is grossly unremarkable. Lungs/Pleura: Negative for subpleural reticulation, traction bronchiectasis/bronchiolectasis, ground glass, architectural distortion or honeycombing. No definitive ill-defined centrilobular nodularity. No pleural fluid. Airway is unremarkable. No air trapping. Upper Abdomen: Visualized portions of the liver, gallbladder, adrenal glands, kidneys, spleen, pancreas, stomach and bowel are grossly unremarkable. No upper abdominal adenopathy. Musculoskeletal: Degenerative changes in the spine. IMPRESSION: 1. No evidence of interstitial lung disease. 2. Aortic atherosclerosis (ICD10-I70.0). Coronary artery calcification. Electronically Signed   By: Newell Eke M.D.   On: 07/27/2024 11:16    Recent Labs: Lab Results  Component Value Date   WBC 5.2 03/09/2024   HGB 11.5 (L) 03/09/2024   PLT 215 03/09/2024   NA 138 03/09/2024   K 3.7 03/09/2024   CL 107 03/09/2024   CO2 22 03/09/2024   GLUCOSE 167 (H) 03/09/2024   BUN 13 03/09/2024   CREATININE 0.81 03/09/2024   BILITOT 0.9 03/09/2024   ALKPHOS 56 03/09/2024   AST 12 (L) 03/09/2024   ALT 11 03/09/2024   PROT 6.8 03/09/2024   ALBUMIN 4.0 03/09/2024   CALCIUM 8.9 03/09/2024   GFRAA 85 09/25/2019   QFTBGOLD Negative 03/14/2017    Speciality Comments: No specialty comments available.  Procedures:  No procedures performed Allergies: Other   Assessment / Plan:     Visit Diagnoses: No diagnosis found.  ***  Orders: No orders of the defined types were placed in this encounter.  No orders of the defined types were placed in this encounter.    Follow-Up Instructions: No follow-ups  on file.   Delrico Minehart M Hartley Wyke, CMA  Note - This record has been created using Animal nutritionist.  Chart creation errors have been sought, but may not always  have been located. Such creation errors do not reflect on  the standard of medical care.

## 2024-07-29 ENCOUNTER — Other Ambulatory Visit

## 2024-07-30 ENCOUNTER — Encounter

## 2024-07-31 ENCOUNTER — Ambulatory Visit (HOSPITAL_COMMUNITY): Admission: RE | Admit: 2024-07-31 | Discharge: 2024-07-31 | Attending: Physician Assistant

## 2024-07-31 DIAGNOSIS — M79662 Pain in left lower leg: Secondary | ICD-10-CM

## 2024-07-31 DIAGNOSIS — M7989 Other specified soft tissue disorders: Secondary | ICD-10-CM

## 2024-08-10 ENCOUNTER — Ambulatory Visit: Admitting: Internal Medicine

## 2024-08-10 DIAGNOSIS — S4991XS Unspecified injury of right shoulder and upper arm, sequela: Secondary | ICD-10-CM

## 2024-08-11 ENCOUNTER — Ambulatory Visit: Payer: Self-pay | Admitting: Pulmonary Disease

## 2024-08-11 ENCOUNTER — Telehealth: Payer: Self-pay

## 2024-08-11 NOTE — Telephone Encounter (Signed)
 She got a CT scan.  Please let her know that the CT looks good with no lung issues or evidence of mold in the lung.

## 2024-08-11 NOTE — Telephone Encounter (Signed)
 Copied from CRM #[id number]. Topic: Clinical - Lab/Test Results >> Aug 10, 2024 12:35 PM Devaughn S wrote: Reason for CRM: Pt calling in regards to lab results, pt stated she needs the results as soon as possible as she is currently dealing with something and needs the results as soon as possible. >> Aug 11, 2024 10:10 AM Isabell A wrote: Patient is requesting a call back for her CT results.  Callback number: [phone]   Called and notified the pt that I have sent Dr. Theophilus a message to advise on CT results.  Pt is eager to know these results and states an organization need them.

## 2024-08-11 NOTE — Telephone Encounter (Signed)
 Copied from CRM #[id number]. Topic: Clinical - Lab/Test Results >> Aug 10, 2024 10:53 AM Rilla B wrote: Reason for CRM: Patient calling in for test results. States Dr Theophilus ordered a PFT and she had it a week ago.  Please call patient @ [phone].   ----------------------------------------------------------------------- From previous Reason for Contact - Scheduling: Patient/patient representative is calling to schedule an appointment. Refer to attachments for appointment information.    Pt is calling inquiring about imaging results.  Dr. Theophilus can you please advise of CT results 12/8. Thank you!

## 2024-08-11 NOTE — Telephone Encounter (Signed)
 Called and spoke to pt - advised of CT results per Dr. Theophilus. Pt verbalized understanding, NFN

## 2024-08-18 ENCOUNTER — Telehealth: Payer: Self-pay | Admitting: Family Medicine

## 2024-08-18 NOTE — Telephone Encounter (Signed)
 Patient is calling because she accidentally got rid of her diflucan  before she could finish taking it and would like for a refill as soon as possible.

## 2024-08-19 MED ORDER — FLUCONAZOLE 150 MG PO TABS: 150.0000 mg | ORAL_TABLET | Freq: Once | ORAL | 1 refills | Status: AC

## 2024-08-19 NOTE — Addendum Note (Signed)
 Addended by: Shonte Soderlund M on: 08/19/2024 01:19 PM   Modules accepted: Orders

## 2024-08-19 NOTE — Telephone Encounter (Signed)
 Pt confirms that she lost her second Diflucan  tablet.  Pt advised that we can send in another Rx.  Pt verbalized understanding with no further questions.   Kayle Passarelli,RN  08/19/24

## 2024-08-24 NOTE — Telephone Encounter (Signed)
 Spoke with the patient. Appointment has been sch for  Name: Nicole, [name] MRN: [id number]  Date: 09/21/2024 Status: Sch  Time: 9:15 AM Length: 30  Visit Type: OFFICE VISIT [8002] Copay: $0.00  Provider: Tobie Gaines, DO       Copied from CRM [id number]. Topic: Appointments - Scheduling Inquiry for Clinic >> Aug 21, 2024  3:39 PM [name] wrote: Reason for CRM: Patient needs to schedule her physical for Feb 2-5th. She states it has to be before Feb 6th. Decision tree denied scheduling and routed to sending a crm to [place] pool for scheduling

## 2024-08-26 ENCOUNTER — Ambulatory Visit: Admitting: Internal Medicine

## 2024-08-26 ENCOUNTER — Encounter: Payer: Self-pay | Admitting: Internal Medicine

## 2024-08-26 VITALS — BP 106/70 | HR 75 | Ht 65.0 in | Wt 172.0 lb

## 2024-08-26 DIAGNOSIS — K6289 Other specified diseases of anus and rectum: Secondary | ICD-10-CM | POA: Diagnosis not present

## 2024-08-26 DIAGNOSIS — M9905 Segmental and somatic dysfunction of pelvic region: Secondary | ICD-10-CM

## 2024-08-26 DIAGNOSIS — M6289 Other specified disorders of muscle: Secondary | ICD-10-CM

## 2024-08-26 NOTE — Patient Instructions (Signed)
 Please do the pelvic floor physical therapy you have set up.   _______________________________________________________  If your blood pressure at your visit was 140/90 or greater, please contact your primary care physician to follow up on this.  _______________________________________________________  If you are age 66 or older, your body mass index should be between 23-30. Your Body mass index is 28.62 kg/m. If this is out of the aforementioned range listed, please consider follow up with your Primary Care Provider.  If you are age 42 or younger, your body mass index should be between 19-25. Your Body mass index is 28.62 kg/m. If this is out of the aformentioned range listed, please consider follow up with your Primary Care Provider.   ________________________________________________________  The [facility] providers would like to encourage you to use MYCHART to communicate with providers for non-urgent requests or questions.  Due to long hold times on the telephone, sending your provider a message by [facility] may be a faster and more efficient way to get a response.  Please allow 48 business hours for a response.  Please remember that this is for non-urgent requests.  _______________________________________________________  [facility] is using a team-based approach to care.  Your team is made up of your doctor and two to three APPS. Our APPS (Nurse Practitioners and Physician Assistants) work with your physician to ensure care continuity for you. They are fully qualified to address your health concerns and develop a treatment plan. They communicate directly with your gastroenterologist to care for you. Seeing the Advanced Practice Practitioners on your physician's team can help you by facilitating care more promptly, often allowing for earlier appointments, access to diagnostic testing, procedures, and other specialty referrals.   I appreciate the opportunity to care for  you. Lupita Commander, MD, [facility]

## 2024-08-26 NOTE — Progress Notes (Signed)
 "       Nicole [name] 64 y.o. Aug 29, 1960 [id number]  Assessment & Plan:   Encounter Diagnoses  Name Primary?   Rectal pain Yes   Pelvic floor dysfunction in female     Chronic symptoms likely due to pelvic floor muscle dysfunction, ? Effect of small rectocele - Agree w/ doing pelvic floor physical therapy. - Communicate all symptoms, including pulling pain, to physical therapist. - Reassured condition not dangerous or concerning. - Return for further evaluation if symptoms persist.    Subjective:   Chief Complaint: Rectal pain  HPI  Discussed the use of AI scribe software for clinical note transcription with the patient, who gave verbal consent to proceed.   Nicole [name] is a 64 year old female with pelvic floor dysfunction who presents for evaluation of rectal pain and constipation.  Rectal pain and constipation - Pulling pain in the rectum for the past three months, often triggered by walking. - Constipation with need to strain during bowel movements. - Manual pressure applied at the bottom of the vagina to facilitate stool passage due to perceived rectal muscle weakness. - Currently repeating pelvic floor physical therapy, previously had with Cheryl Gray PT at [place] and now with a different therapist at the same location. - Physical therapy restarted this month following referral from urology. - Advised by cardiologist to avoid straining during bowel movements due to coronary artery disease.  Urinary symptoms - Episodes of blood in the urine without associated pain or burning. - No urinary leakage. - Vaginal cream prescribed by urology and recommended by gynecology to treat atrophic vaginitis; plans to initiate use.  Hemorrhoidal disease - History of hemorrhoids with prior prolapse during constipation. - Hemorrhoids have resolved.  Abdominal wall hernias - Umbilical hernia and a lateral abdominal wall hernia present. - Hernias protrude but do not currently cause  pain.      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 01/18/2022   Sensation of food items getting stuck in upper esophagus to be evaluated first by esophagram and then by EGD with Dr. Avram over the next few weeks.   Hypercholesterolemia    Morphea 2022   Nodule of skin of breast 01/04/2021   Obesity    OSA (obstructive sleep apnea) 12/27/2015   Palpitations    negative cardiac w/u per Dr. Edith 2/09   Reflux esophagitis    Sleep apnea    No CPAP   Umbilical hernia    Vaginal discharge 03/02/2020   Past Surgical History:  Procedure Laterality Date   COLONOSCOPY     ECTOPIC PREGNANCY SURGERY     EXPLORATORY LAPAROTOMY  2000   x2 (Dr. Shanda Muscat)   HERPES SIMPLEX VIRUS DFA     TUBAL LIGATION  1987   Social History   Social History Narrative   Current Social History 02/16/2021        Patient lives alone in a [place] which is 2 stories. There are 13 steps with  handrails up to the entrance the patient uses.       Patient's method of transportation is city bus.      The highest level of education was high school diploma.      The patient currently disabled.      Identified important Relationships are My mother, my sisters, children, grandchildren (5)       Pets : None       Interests / Fun: Read, watch TV, movies, social media, talk on phone, shopping, travel       Current Stressors: Simple people get on my nerves,;drama.       Religious / Personal Beliefs: Christian, Azure,  Pentecostal.       Other: I stay away from people.      FREDRIK Alcide, BSN, RN-BC              family history includes Diabetes in her brother, father, mother, sister, sister, and sister; Glaucoma in her mother; Heart attack in her maternal grandfather and maternal grandmother; Heart disease in her mother; Hypertension in her brother, mother, sister, sister, and sister; Mental illness in her son and son; Pancreatic cancer in her maternal uncle and another family member; Sleep apnea in her sister; Thyroid  disease in her mother.   Review of Systems As per HPI  Objective:   Physical Exam BP 106/70   Pulse 75   Ht 5' 5 (1.651 m)   Wt 172 lb (78 kg)   LMP 03/03/2016   BMI 28.62 kg/m   Patti Jordan, CMA present.  Abdomen is soft, mildly tender small umbilical hernia, otherwise nontender w/o mass or hernia   Anoderm inspection normal Anal wink was present Digital exam revealed normal resting tone and voluntary squeeze. No mass  present. ? Tiny tender rectocele Simulated defecation with valsalva revealed appropriate abdominal contraction and increased descent.        [1]  Allergies Allergen Reactions   Other Itching and Hives    Pain medication ordered at [facility] in 2009 - Patient does not know name of medication  Pain medication given at [facility] approximately 2009 Pain medication ordered at [facility] in 2009 - Patient does not know name of medication  Pain medication given at [facility] approximately 2009  [2]  No outpatient medications have been marked as taking for the 08/26/24 encounter (Office Visit) with Avram Lupita BRAVO, MD.   "

## 2024-08-28 ENCOUNTER — Encounter: Payer: Self-pay | Admitting: Cardiology

## 2024-08-28 ENCOUNTER — Ambulatory Visit (HOSPITAL_COMMUNITY)
Admission: RE | Admit: 2024-08-28 | Discharge: 2024-08-28 | Disposition: A | Source: Ambulatory Visit | Attending: Physician Assistant

## 2024-08-28 ENCOUNTER — Ambulatory Visit: Admitting: Cardiology

## 2024-08-28 VITALS — BP 110/70 | HR 90 | Ht 65.5 in | Wt 170.0 lb

## 2024-08-28 DIAGNOSIS — R0602 Shortness of breath: Secondary | ICD-10-CM | POA: Diagnosis not present

## 2024-08-28 DIAGNOSIS — I2510 Atherosclerotic heart disease of native coronary artery without angina pectoris: Secondary | ICD-10-CM | POA: Insufficient documentation

## 2024-08-28 LAB — ECHOCARDIOGRAM COMPLETE
Area-P 1/2: 2.69 cm2
Height: 65.5 in
S' Lateral: 3 cm
Weight: 2720 [oz_av]

## 2024-08-28 NOTE — Progress Notes (Signed)
 " Cardiology Office Note:   Date:  08/28/2024  ID:  Nicole [name], DOB [birth year], MRN [id number] PCP: Nicole Mliss Dragon, MD  [facility] Providers Cardiologist:  Nicole Schilling, MD {  History of Present Illness:   Nicole [name] is a 64 y.o. female who presents for evaluation of chest pain.  I saw her in 2018 for evaluation of chest pain.  She had a negative POET (Plain Old Exercise Treadmill).    She had an echocardiogram 2009 which was essentially normal.    She was having palpitations and she was to wear a monitor but she did not wear this.  I saw her last in 2023 and she had chest pain.  She had negative POET (Plain Old Exercise Treadmill).  She was at the beach not long ago when she had some pain in her foot.  She thought she might have a blood clot.  She went to the emergency room about 4 days ago at [facility] .  She was not thought to have anything that needed further studies and was sent home.  However, she was again in the emergency room 2 days ago for chest pain.  I reviewed these records for this visit.  There was no objective evidence of ischemia.  She had a CT to rule out PE and this was negative.  However, there was coronary calcium noted.  This had been noted previously and I had suggested a coronary CTA but this did not happen.  She had previously refused PET scanning.  I did set her up for the CT scan and she eventually had a CT in December 2024 with mid 24 to 49% stenosis in the LAD.    She was last see in the office in Nov with one of our APPs.  She was seen in July by Dr. Santo and had some chest pain but did not want to have a PET scan.  At the last appointment and follow-up in November she was not describing discomfort.  She presents for follow-up with me.  She says she occasionally is getting some bleeding chest discomfort.  It happens at rest.  It might be positional.  It goes away very quickly.  It is on the left of sternum.  It actually does not happen when she  goes out walking and she is doing some of this.  She has lost weight.  She stopped smoking.  She is looking at getting out of a bad relationship and moving into a safer place.   ROS: Positive for occasional headaches and some ruptured blood vessels in her eyes occasionally.  Otherwise as stated in the HPI negative for all other systems.  Studies Reviewed:    EKG:     NA   Risk Assessment/Calculations:              Physical Exam:   VS:  BP 110/70 (BP Location: Left Arm, Patient Position: Sitting, Cuff Size: Normal)   Pulse 90   Ht 5' 5.5 (1.664 m)   Wt 170 lb (77.1 kg)   LMP 03/03/2016   SpO2 97%   BMI 27.86 kg/m    Wt Readings from Last 3 Encounters:  08/28/24 170 lb (77.1 kg)  08/26/24 172 lb (78 kg)  07/20/24 173 lb (78.5 kg)     GEN: Well nourished, well developed in no acute distress NECK: No JVD; No carotid bruits CARDIAC: RRR, no murmurs, rubs, gallops RESPIRATORY:  Clear to auscultation without rales, wheezing or  rhonchi  ABDOMEN: Soft, non-tender, non-distended EXTREMITIES:  No edema; No deformity   ASSESSMENT AND PLAN:   Precordial chest pain:   Her chest pain has nonanginal greater than anginal features and actually seems to be improved compared to previous.  She has not wanted previous testing.  She will let us  know if her symptoms worsen.  Until then she can continue with continued risk reduction.  Dyslipidemia: She has not wanted to take a statin.  She says she has dramatically changed her diet.  She is going to go see her primary provider and get a fasting lipid profile and further management can be based on these results.  I would suggest a goal LDL in the 70s.   Follow up with Nicole [name], PAc  Signed, Nicole Schilling, MD   "

## 2024-08-28 NOTE — Patient Instructions (Signed)
 Medication Instructions:  Your physician recommends that you continue on your current medications as directed. Please refer to the Current Medication list given to you today.  *If you need a refill on your cardiac medications before your next appointment, please call your pharmacy*  Lab Work: NONE If you have labs (blood work) drawn today and your tests are completely normal, you will receive your results only by: MyChart Message (if you have MyChart) OR A paper copy in the mail If you have any lab test that is abnormal or we need to change your treatment, we will call you to review the results.  Testing/Procedures: NONE  Follow-Up: At [facility], you and your health needs are our priority.  As part of our continuing mission to provide you with exceptional heart care, our providers are all part of one team.  This team includes your primary Cardiologist (physician) and Advanced Practice Providers or APPs (Physician Assistants and Nurse Practitioners) who all work together to provide you with the care you need, when you need it.  Your next appointment:   6 month(s)  Provider:    Lorette Kapur, PA-C  We recommend signing up for the patient portal called MyChart.  Sign up information is provided on this After Visit Summary.  MyChart is used to connect with patients for Virtual Visits (Telemedicine).  Patients are able to view lab/test results, encounter notes, upcoming appointments, etc.  Non-urgent messages can be sent to your provider as well.   To learn more about what you can do with MyChart, go to [email or web address].

## 2024-09-02 ENCOUNTER — Ambulatory Visit: Admitting: Emergency Medicine

## 2024-09-04 ENCOUNTER — Ambulatory Visit: Admitting: General Practice

## 2024-09-07 ENCOUNTER — Ambulatory Visit: Admitting: Physical Therapy

## 2024-09-07 NOTE — Therapy (Signed)
 " OUTPATIENT PHYSICAL THERAPY FEMALE PELVIC EVALUATION   Patient Name: Nicole [name] MRN: [id number] DOB:September 20, 1960, 64 y.o., female Today's Date: 09/08/2024  END OF SESSION:  PT End of Session - 09/08/24 1223     Visit Number 1    Date for Recertification  12/07/24    Authorization Type [employer] D[employer]PLETE and medicaid    Authorization Time Period no auth needed    PT Start Time 1145    PT Stop Time 1230    PT Time Calculation (min) 45 min    Activity Tolerance Patient tolerated treatment well;No increased pain    Behavior During Therapy WFL for tasks assessed/performed          Past Medical History:  Diagnosis Date   Abnormal auditory perception of both ears 03/13/2024   Allergic rhinitis 03/13/2024   Anemia    Angio-edema    Anxiety    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 01/18/2022   Sensation of food items getting stuck in upper esophagus to be evaluated first by esophagram and then by EGD with Dr. Avram over the next few weeks.   Hypercholesterolemia    Morphea 2022   Nodule of skin of breast 01/04/2021   Obesity    OSA (obstructive sleep apnea) 12/27/2015   Palpitations    negative cardiac w/u per Dr. Edith 2/09   Reflux esophagitis    Sleep apnea    No CPAP   Umbilical hernia    Vaginal discharge 03/02/2020   Past Surgical History:  Procedure Laterality Date   COLONOSCOPY     ECTOPIC PREGNANCY SURGERY     EXPLORATORY LAPAROTOMY  2000   x2 (Dr. 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 07/03/2021   Other personal history of psychological trauma, not elsewhere classified 03/16/2021   Hyperlipidemia 11/10/2019   History of recurrent UTI (urinary tract infection) 11/12/2017   Marijuana use 11/12/2017   Hematuria of unknown etiology 06/01/2013   Tinea pedis 04/14/2007    PCP: Trudy Mliss Dragon, MD  REFERRING PROVIDER: Jeralyn Crutch, MD  REFERRING DIAG: M79.10 (ICD-10-CM) - Myalgia   THERAPY DIAG:  Cramp and spasm  Pelvic pain  Chronic pain of both shoulders  Muscle weakness  Muscle weakness (generalized) Rationale for Evaluation and Treatment: Rehabilitation   ONSET DATE: 2024  SUBJECTIVE:                                                                                                                                                                                            SUBJECTIVE STATEMENT: Patient reports that she has very weak pelvic floor, it is so weak that have to push my bowels out This has been going on for about 2 years. She used to work with [employer]   PAIN:  Are you having pain? Yes NPRS scale: 2-4/10 Pain location: Anal and Vaginal   Pain type: aching and sharp Pain description:  intermittent - not every day   Aggravating factors: sex and trying to have a BM Relieving factors: when she splints, just endures pain   PRECAUTIONS: None   RED FLAGS: None       WEIGHT BEARING RESTRICTIONS: No   FALLS:  Has patient fallen in last 6 months? No   OCCUPATION: on disability- life threatening- migraines, aneurysm, low BP, depression and anxiety, arteries with clogged up    ACTIVITY LEVEL : has been depressed, moving in march- lies in bed, walks a little, not much, want to start to go back to the gym, get more physically active   PLOF: Independent   PATIENT GOALS: strengthen her pelvic floor so she does not have to splint   PERTINENT HISTORY:     BOWEL MOVEMENT: Pain with bowel movement: Yes Type of bowel movement:Type (Bristol Stool Scale) 1-6, Frequency 2xday, Strain sometimes, and Splinting yes when it is hard stool Fully empty rectum: No Leakage: No Urgency: No Pads: No Fiber supplement/laxative No   URINATION: Pain with urination: Yes sometimes Fully empty bladder: No- has a bladder specialist ( [facility]) Stream: Strong and mildde Urgency: Yes  Frequency: so so Nocturia: no Fluid Intake: does not like water- it is toilet water, likes bottle water, alkaline water, mountain, grapefruit juice,pineapple and orange, almond milk, coconut milk Leakage: none Pads: No   INTERCOURSE: a lot of pain, due to vaginal dryness- will start using them             Ability to have vaginal penetration Yes  Pain with intercourse: During Penetration DrynessYes  Climax: yes Marinoff Scale: 1/3 Lubricant: no   PREGNANCY: has 3 children Vaginal deliveries 3 Tearing Yes: big tear all the way from vagina to rectum Episiotomy Yes  C-section deliveries no Currently pregnant No   PROLAPSE: Pressure sometimes     OBJECTIVE:  Note: Objective measures were completed at Evaluation unless otherwise noted.    PATIENT SURVEYS:    PFIQ-7: 25   COGNITION: Overall  cognitive status:  Within functional limits for tasks assessed                          SENSATION: Light touch: Appears intact     FUNCTIONAL TESTS:  Squat: WFL Single leg stance: WFL             Rt:              Lt:  Curl-up test: 1 finger DRA Sit-up test:  Active straight leg raise: pelvic sway     GAIT: Assistive device utilized: None Comments: WNL   POSTURE: rounded shoulders and forward head     LUMBARAROM/PROM:   A/PROM A/PROM  Eval (% available)  Flexion  90  Extension  90  Right lateral flexion  90  Left lateral flexion  90  Right rotation  90  Left rotation  90   (Blank rows = not tested)   PALPATION: General:    Abdominal: Breathing: able to diaphragmatic breathing  Tenderness: umbilicus Scar tissue: umbilicus Diastasis: 1 finger                  External Perineal Exam:  to be assessed                             Internal Pelvic Floor: to be assessed   Patient confirms identification and approves PT to assess internal pelvic floor and treatment No   PELVIC MMT:   MMT eval  Vaginal    Internal Anal Sphincter    External Anal Sphincter    Puborectalis    (Blank rows = not tested)         TONE: to be assessed   PROLAPSE: to be assessed   TODAY'S TREATMENT:                                                                                                                              DATE: 09/08/2024  EVAL  Examination completed, findings reviewed, pt educated on POC, HEP, Pt motivated to participate in PT and agreeable to attempt recommendations.      PATIENT EDUCATION:  Education details: See above Person educated: Patient Education method: Explanation, Demonstration, Tactile cues, Verbal cues, and Handouts Education comprehension: verbalized understanding   HOME EXERCISE PROGRAM: To be developed   ASSESSMENT:   CLINICAL IMPRESSION: Patient is a 7  who was seen today for physical therapy evaluation and treatment for rectal pain and  rectocele.Exam findings are notable for good lumbar AROM, hip weakness, fair breathing strategies, abdominal weakness with 1 finger DRA, ( patient reports that she has umbilical hernia - awaiting repair) pelvic floor muscle will be assessed next visit.Signs and symptoms are most consistent with deep core coordination issues and pelvic floor muscle/core weakness)  Initial treatment consisted of pt education on gentle core activation with theraband and pilates ball and HEP was initiated. Patient's  quality of life has been affected, patient is frustrated and will benefit from physical therapy to address deficits, reduce rectocele, improve pelvic floor strength  and improve quality of life.     OBJECTIVE IMPAIRMENTS: decreased activity tolerance, decreased coordination, decreased endurance, decreased mobility, decreased ROM, decreased strength, increased fascial restrictions, increased muscle spasms, impaired flexibility, impaired tone, improper body mechanics, postural dysfunction, and pain.    ACTIVITY LIMITATIONS: toileting   PARTICIPATION LIMITATIONS: interpersonal relationship   PERSONAL FACTORS: Behavior pattern and Time since onset of injury/illness/exacerbation are also affecting patient's functional outcome.    REHAB POTENTIAL: Good   CLINICAL DECISION MAKING: Stable/uncomplicated   EVALUATION COMPLEXITY: Low     GOALS: Goals reviewed with patient? Yes   SHORT TERM GOALS: Target date: 10/06/2024       Pt will be independent with HEP in order to improve activity tolerance.    Baseline: Goal status: INITIAL   2.  Pt will be independent with use of squatty potty, relaxed toileting mechanics, and improved bowel movement techniques in order to increase ease of bowel movements and complete evacuation.     Baseline:  Goal status: INITIAL   3.  Patient will be educated on abdominal massage   Baseline:  Goal status: INITIAL   4.  Patient will be educated on bowel routine and  healthy bowel PT recommendations Baseline:  Goal status: INITIAL     LONG TERM GOALS: Target date: 12/07/2024   Pt will be independent with advanced HEP in order to improve activity tolerance.    Baseline:  Goal status: INITIAL   2.  Patient will report max 1/10 rectal/ vaginal pain Baseline:  Goal status: INITIAL   3.  Patient will report no pain with intercourse Baseline:  Goal status: INITIAL   4.  Patient will be able to have bowel movements without splinting her posterior vaginal wall Baseline:  Goal status: INITIAL     PLAN:   PT FREQUENCY: 1-2x/week   PT DURATION: 12 weeks    PLANNED INTERVENTIONS: 97164- PT Re-evaluation, 97110-Therapeutic exercises, 97530- Therapeutic activity, 97112- Neuromuscular re-education, 97535- Self Care, [id number]- Manual therapy, [phone]- Gait training, [phone]- Aquatic Therapy, [phone]- Electrical stimulation (unattended), [phone]- Traction (mechanical), [id number]- Ionotophoresis 4mg /ml Dexamethasone , 79439 (1-2 muscles), 20561 (3+ muscles)- Dry Needling, Patient/Family education, Balance training, Taping, Joint mobilization, Joint manipulation, Spinal manipulation, Spinal mobilization, Scar mobilization, Vestibular training, Cryotherapy, Moist heat, and Biofeedback   PLAN FOR NEXT SESSION: internal pelvic floor muscle assessment, continue core exercises, review her previous HEP   Pailyn Bellevue, PT, DPT 09/08/24 12:28 PM  [facility] [address] [place], [place] [address] Phone # [phone] Fax [phone]     "

## 2024-09-08 ENCOUNTER — Other Ambulatory Visit: Payer: Self-pay

## 2024-09-08 ENCOUNTER — Encounter: Payer: Self-pay | Admitting: Physical Therapy

## 2024-09-08 ENCOUNTER — Ambulatory Visit: Attending: Obstetrics and Gynecology | Admitting: Physical Therapy

## 2024-09-08 DIAGNOSIS — M6281 Muscle weakness (generalized): Secondary | ICD-10-CM | POA: Diagnosis present

## 2024-09-08 DIAGNOSIS — R252 Cramp and spasm: Secondary | ICD-10-CM | POA: Insufficient documentation

## 2024-09-08 DIAGNOSIS — M25511 Pain in right shoulder: Secondary | ICD-10-CM | POA: Insufficient documentation

## 2024-09-08 DIAGNOSIS — M791 Myalgia, unspecified site: Secondary | ICD-10-CM | POA: Insufficient documentation

## 2024-09-08 DIAGNOSIS — R102 Pelvic and perineal pain unspecified side: Secondary | ICD-10-CM | POA: Insufficient documentation

## 2024-09-08 DIAGNOSIS — G8929 Other chronic pain: Secondary | ICD-10-CM | POA: Diagnosis present

## 2024-09-08 DIAGNOSIS — M25512 Pain in left shoulder: Secondary | ICD-10-CM | POA: Insufficient documentation

## 2024-09-16 NOTE — Progress Notes (Unsigned)
 "  Office Visit Note  Patient: Nicole [name]             Date of Birth: 1961/02/04           MRN: [id number]             PCP: Trudy Mliss Dragon, MD Referring: Trudy Mliss Dragon, MD Visit Date: 09/24/2024   Subjective:  No chief complaint on file.   History of Present Illness: Nicole [name] is a 64 y.o. female here for follow up on shoulder pain and skin changes.   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 Nicole [name] is a 63 y.o. female here for follow up with ongoing joint pain particularly bothering her at the shoulders.    She has a history of shoulder pain, primarily on the right side, which was previously severe but has since improved. The left shoulder pain has also improved. She manages the pain with self-healing methods, anti-inflammatory foods, and occasionally uses topical treatments and heat.   She has been diagnosed with localized scleroderma, also known as morphea, which has caused skin changes including hair loss and indentations on her legs.  She saw Dr. Jorizzo in [place] and subsequently Greig Blower in [place] as specialists for the scleroderma.  But she was never able to follow-up since initial visit in February 2024.   She experiences redness and occasional pain in the tips of her fingers, which she associates with a past COVID-19 vaccine. The redness sometimes turns pale and cold, and she has developed rashes on her fingers. She does not take blood pressure medication and reports having low blood pressure. She states concern because her father had similar symptoms and eventually had his hands and feet amputated.      Previous HPI 11/29/2022 Nicole [name] is a 64 y.o. female here for follow up with ongoing joint pain particularly bothering her at the shoulders.  Since her last visit right shoulder pain is doing better but her left shoulder is currently  causing more problems.  Gets pain with lifting and pulling activities worst across the top of the shoulder area.  She did not end up using the topical Voltaren  at all because she was not very confident about the correct way to do so and prefers avoiding medications is much as possible.  Otherwise she is not experiencing any radiation of symptoms no increased joint pain or swelling elsewhere.  She was scheduled for bone density testing in August of this year.     Previous HPI 10/17/22 Nicole [name] is a 64 y.o. female here for follow up for the previous findings of morphea and also with joint pain in several areas most very currently in the right shoulder.  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  She has been recommended to use compression socks to minimize venous stasis but not on systemic medical therapy.  Otherwise has been doing pretty well overall though she has noticed some overall worsening with her energy and health since decreased physical activity.  She has been working on increasing this again but has right shoulder pain this initially started while walking.  Now it is becoming more painful when she tries to reach across the body and it somewhat alleviated with reaching overhead.  Symptoms are not radiating anywhere she has no known history of serious injury to the shoulder. She is also concerned about bone density and interested in screening for osteoporosis.   Previous HPI 01/01/22 Nicole [name] is a 64 y.o. female here for morphea evaluation for possible systemic sclerosis. She has been diagnosed by dermatology biopsy of affected area on her leg. She has skin rashes in multiple areas she estimates for about 20 years in total not sure about when these have progressed to become more noticeable.  Is associated with loss of hair as well worst in  bilateral temporal areas and in her distal legs.  She has a few hyperpigmented skin patches and some areas of thickening or indentations.  Some hypopigmented areas as well but smaller spots and this is also been a scattered distribution including her torso.  She has noticed tightness or catching and tendons on the top of her feet that comes and goes feels like this takes several minutes until release and get back to normal range of movement.  She notices some swelling intermittently in her hands worse when walking and standing for prolonged time.  She saw pulmonology for assessment with no significant CT abnormalities. GI workup is planned for evaluating esophageal dysmotility. Concern is also for possible esophageal stricture or scarring related to chronic GERD.    No Rheumatology ROS completed.   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 01/18/2022   Sensation of food items getting stuck in upper esophagus to be evaluated first by esophagram and then by EGD with Dr. Avram over the next few weeks.   Hypercholesterolemia    Morphea 2022   Nodule of skin of breast 01/04/2021   Obesity    OSA (obstructive sleep apnea) 12/27/2015   Palpitations    negative cardiac w/u per Dr. Edith 2/09   Reflux esophagitis    Sleep  apnea    No CPAP   Umbilical hernia    Vaginal discharge 03/02/2020    Family History  Problem Relation Age of Onset   Hypertension Mother    Diabetes Mother    Heart disease Mother    Thyroid  disease Mother    Glaucoma Mother    Diabetes Father    Hypertension Sister    Diabetes Sister    Diabetes Sister    Hypertension Sister    Sleep apnea Sister    Hypertension Sister    Diabetes Sister    Hypertension Brother    Diabetes Brother    Pancreatic cancer Maternal Uncle    Heart attack Maternal Grandmother    Heart attack Maternal Grandfather    Mental illness Son    Mental illness Son    Pancreatic cancer Other        family history   Colon cancer Neg Hx    Past Surgical History:  Procedure Laterality Date   COLONOSCOPY     ECTOPIC PREGNANCY SURGERY     EXPLORATORY LAPAROTOMY  2000   x2 (Dr. Shanda Muscat)   HERPES SIMPLEX VIRUS DFA     TUBAL LIGATION  1987   Social History   Social History Narrative   Current Social  History 02/16/2021        Patient lives alone in a [place] which is 2 stories. There are 13 steps with handrails up to the entrance the patient uses.       Patient's method of transportation is city bus.      The highest level of education was high school diploma.      The patient currently disabled.      Identified important Relationships are My mother, my sisters, children, grandchildren (5)       Pets : None       Interests / Fun: Read, watch TV, movies, social media, talk on phone, shopping, travel       Current Stressors: Simple people get on my nerves,;drama.       Religious / Personal Beliefs: Christian, [place], Pentecostal.       Other: I stay away from people.      FREDRIK Alcide, BSN, RN-BC              Immunization History  Administered Date(s) Administered   Td 10/18/2005   Tdap 10/27/2012     Objective: Vital Signs: LMP 03/03/2016    Physical Exam   Musculoskeletal Exam: ***   Investigation: No additional findings.  Imaging: ECHOCARDIOGRAM COMPLETE Result Date: 08/28/2024    ECHOCARDIOGRAM REPORT   Patient Name:   SANDEE BERNATH Clyatt   Date of Exam: 08/28/2024 Medical Rec #:  [id number]     Height:       65.5 in Accession #:    [id number]    Weight:       170.0 lb Date of Birth:  [birth year]    BSA:          1.857 m Patient Age:    63 years      BP:           110/70 mmHg Patient Gender: F             HR:           62 bpm. Exam Location:  [address] Procedure: 2D Echo, 3D Echo, Cardiac Doppler, Color Doppler and Strain Analysis            (Both Spectral and  Color Flow Doppler were utilized during            procedure). Indications:    R06.02 Shortness of breath  History:        Patient has prior history of Echocardiogram examinations, most                 recent 09/26/2007. Arrythmias:Palpitations.; Risk                 Factors:Dyslipidemia and Sleep Apnea.  Sonographer:    Carl Coma RDCS Referring Phys: [id number] LORETTE GRADE DUNLAP IMPRESSIONS  1.  Left ventricular ejection fraction, by estimation, is 55 to 60%. The left ventricle has normal function. The left ventricle has no regional wall motion abnormalities. Left ventricular diastolic parameters were normal. The average left ventricular global longitudinal strain is -24.7 %. The global longitudinal strain is normal.  2. Right ventricular systolic function is normal. The right ventricular size is normal.  3. Left atrial size was mildly dilated.  4. The mitral valve is abnormal. Mild mitral valve regurgitation. No evidence of mitral stenosis.  5. The aortic valve is tricuspid. There is mild calcification of the aortic valve. Aortic valve regurgitation is not visualized. Aortic valve sclerosis is present, with no evidence of aortic valve stenosis.  6. The inferior vena cava is normal in size with greater than 50% respiratory variability, suggesting right atrial pressure of 3 mmHg. FINDINGS  Left Ventricle: Left ventricular ejection fraction, by estimation, is 55 to 60%. The left ventricle has normal function. The left ventricle has no regional wall motion abnormalities. The average left ventricular global longitudinal strain is -24.7 %. Strain was performed and the global longitudinal strain is normal. The left ventricular internal cavity size was normal in size. There is no left ventricular hypertrophy. Left ventricular diastolic parameters were normal. Right Ventricle: The right ventricular size is normal. No increase in right ventricular wall thickness. Right ventricular systolic function is normal. Left Atrium: Left atrial size was mildly dilated. Right Atrium: Right atrial size was normal in size. Pericardium: There is no evidence of pericardial effusion. Mitral Valve: The mitral valve is abnormal. There is mild thickening of the mitral valve leaflet(s). Mild mitral valve regurgitation. No evidence of mitral valve stenosis. Tricuspid Valve: The tricuspid valve is normal in structure. Tricuspid valve  regurgitation is mild . No evidence of tricuspid stenosis. Aortic Valve: The aortic valve is tricuspid. There is mild calcification of the aortic valve. Aortic valve regurgitation is not visualized. Aortic valve sclerosis is present, with no evidence of aortic valve stenosis. Pulmonic Valve: The pulmonic valve was normal in structure. Pulmonic valve regurgitation is mild. No evidence of pulmonic stenosis. Aorta: The aortic root is normal in size and structure. Venous: The inferior vena cava is normal in size with greater than 50% respiratory variability, suggesting right atrial pressure of 3 mmHg. IAS/Shunts: No atrial level shunt detected by color flow Doppler. Additional Comments: 3D was performed not requiring image post processing on an independent workstation and was normal.  LEFT VENTRICLE PLAX 2D LVIDd:         4.20 cm   Diastology LVIDs:         3.00 cm   LV e' medial:    7.89 cm/s LV PW:         0.80 cm   LV E/e' medial:  6.0 LV IVS:        0.80 cm   LV e' lateral:   12.15 cm/s LVOT diam:  1.90 cm   LV E/e' lateral: 3.9 LV SV:         54 LV SV Index:   29        2D Longitudinal Strain LVOT Area:     2.84 cm  2D Strain GLS (A4C):   -25.9 %                          2D Strain GLS (A3C):   -25.3 %                          2D Strain GLS (A2C):   -23.0 %                          2D Strain GLS Avg:     -24.7 %                           3D Volume EF:                          3D EF:        55 %                          LV EDV:       101 ml                          LV ESV:       45 ml                          LV SV:        56 ml RIGHT VENTRICLE             IVC RV Basal diam:  3.40 cm     IVC diam: 0.80 cm RV S prime:     16.80 cm/s TAPSE (M-mode): 3.1 cm LEFT ATRIUM             Index        RIGHT ATRIUM           Index LA diam:        3.90 cm 2.10 cm/m   RA Area:     11.60 cm LA Vol (A2C):   36.7 ml 19.77 ml/m  RA Volume:   26.50 ml  14.27 ml/m LA Vol (A4C):   28.4 ml 15.30 ml/m LA Biplane Vol: 33.9 ml 18.26  ml/m  AORTIC VALVE LVOT Vmax:   98.77 cm/s LVOT Vmean:  60.133 cm/s LVOT VTI:    0.191 m  AORTA Ao Root diam: 2.90 cm Ao Asc diam:  2.80 cm MITRAL VALVE               TRICUSPID VALVE MV Area (PHT): 2.69 cm    TR Peak grad:   19.0 mmHg MV Decel Time: 282 msec    TR Vmax:        218.00 cm/s MV E velocity: 47.20 cm/s MV A velocity: 70.75 cm/s  SHUNTS MV E/A ratio:  0.67        Systemic VTI:  0.19 m  Systemic Diam: 1.90 cm Maude Emmer MD Electronically signed by Maude Emmer MD Signature Date/Time: 08/28/2024/3:15:31 PM    Final     Recent Labs: Lab Results  Component Value Date   WBC 5.2 03/09/2024   HGB 11.5 (L) 03/09/2024   PLT 215 03/09/2024   NA 138 03/09/2024   K 3.7 03/09/2024   CL 107 03/09/2024   CO2 22 03/09/2024   GLUCOSE 167 (H) 03/09/2024   BUN 13 03/09/2024   CREATININE 0.81 03/09/2024   BILITOT 0.9 03/09/2024   ALKPHOS 56 03/09/2024   AST 12 (L) 03/09/2024   ALT 11 03/09/2024   PROT 6.8 03/09/2024   ALBUMIN 4.0 03/09/2024   CALCIUM 8.9 03/09/2024   GFRAA 85 09/25/2019   QFTBGOLD Negative 03/14/2017    Speciality Comments: No specialty comments available.  Procedures:  No procedures performed Allergies: Other   Assessment / Plan:     Visit Diagnoses:  Assessment & Plan Injury of right acromioclavicular joint, sequela      ***  Follow-Up Instructions: No follow-ups on file.   Jodilyn Giese M Jood Retana, CMA  Note - This record has been created using Animal nutritionist.  Chart creation errors have been sought, but may not always  have been located. Such creation errors do not reflect on  the standard of medical care. "

## 2024-09-16 NOTE — Assessment & Plan Note (Signed)
 SABRA

## 2024-09-21 ENCOUNTER — Ambulatory Visit: Payer: Self-pay | Admitting: Student

## 2024-09-21 NOTE — Progress Notes (Signed)
 Nicole [name] is a 64 y.o.  with  reports that she has been smoking cigarettes. She has been exposed to tobacco smoke. She has never used smokeless tobacco. 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        History of Present Illness This is a 64 year old female presenting with back and right hip pain after a fall on the ice last week.  The patient reports experiencing back and right hip pain following a fall on the ice last week. She describes the pain as worsening over time. Initially, she managed the pain with ibuprofen  but limited its use due to concerns about gastrointestinal bleeding, as she has a history of stomach issues. She is seeking a prescription for a potent analgesic to alleviate her discomfort. She recalls being prescribed 120 tablets of Percocet in the emergency room previously, which she did not fill due to her general avoidance of medication use. She expresses a preference for oral medication over injections. The patient describes her fall as a direct impact on her right hip, followed by another fall where she landed flat on her back, hitting her head slightly. A CT scan was performed to rule out  any serious injuries. She is currently under the care of a spine specialist.      Patient has no co-morbidities  or medications that might increase the risk for developing a severe infection     reports that she has been smoking cigarettes. She has been exposed to tobacco smoke. She has never used smokeless tobacco.  Review of Systems  Review of systems is otherwise negative except as noted in the HPI and Assessment/MDM   Physical Exam  BP 133/89 (BP Location: Left arm, Patient Position: Sitting)   Pulse 66   Temp 98.2 F (36.8 C) (Oral)   Resp 16   Ht 1.651 m (5' 5) Comment: patient reported  Wt 77.6 kg (171 lb) Comment: patient reported  SpO2 99%   BMI 28.46 kg/m   Constitutional:      General: Patient is not in acute distress.    Appearance: Normal appearance.  Neurological:     General: No focal deficit present.     Mental Status: alert and oriented to person, place, and time.  HENT:     Eyes:     Conjunctiva/sclera: Conjunctivae normal. Pharynx normal    There is no associated anterior cervical lymphadenopathy    Pupils: Pupils are equal, round, and reactive to light.     TM's normal with no visible effusion  Cardiovascular:     Heart sounds: Normal heart sounds. No murmur heard.    No  Lower extremity edema noted Pulmonary:     Effort: Pulmonary effort is normal. No respiratory distress.     Breath sounds: No wheezing, rhonchi or rales.  Abdominal:     General: Bowel sounds are normal. There is no distension.     Tenderness: There is no abdominal tenderness.  Musculoskeletal:        General: Normal range of motion.     Neck:  No rigidity.  Skin:    General: Skin is warm and dry.  Psychiatric:        Mood and Affect: Mood normal.   XR Hip Uni W Or W/O Pelvis 2-3 Vw Rt  Final Result by Eduard Vikki Guppy, MD (02/02 1323)  X-RAY HIP RIGHT WITH OR WITHOUT PELVIS (2-3 VIEWS), 09/21/2024 12:25 PM    INDICATION: Pain in right hip \ M25.551 Pain in right hip    COMPARISON: None.    IMPRESSION:  1.  No acute fracture.  2.  No malalignment.  3.  Mild bilateral hip degenerative changes.      XR Spine Lumbar 2-3 Views  Final Result by Eduard Vikki Guppy, MD (02/02 1322)  XR SPINE LUMBAR 2-3 VIEWS, 09/21/2024 12:25 PM    INDICATION: Low back pain, unspecified \ M54.50 Low back pain, unspecified       COMPARISON: None    VIEWS: AP and lateral    IMPRESSION:  1.  No acute fracture or traumatic malalignment.   2.  Mild multilevel degenerative changes of the lumbar spine.  3.  Grade 1 anterolisthesis at L4-5.  4.  Facet arthropathy is most pronounced at L4-L5 and L5-S1 bilaterally.  5.  Osteopenia.              DIAGNOSIS/PLAN     1. Right hip pain  XR Hip Uni W Or W/O Pelvis 2-3 Vw Rt   HYDROcodone -acetaminophen  (NORCO) 5-325 mg per tablet   methylPREDNISolone  (MEDROL  DOSEPAK) 4 mg 6 day dose pack    2. Lumbar back pain  XR Spine Lumbar 2-3 Views   HYDROcodone -acetaminophen  (NORCO) 5-325 mg per tablet   methylPREDNISolone  (MEDROL  DOSEPAK) 4 mg 6 day dose pack      Assessment & Plan Initial Assessment: Back and right hip pain after a fall on the ice last week. X-rays show no fractures or bone chips, only minor degenerative changes in the back.  Differential Diagnosis: - Fracture: X-rays show no fractures or bone chips. - Degenerative changes: Minor changes observed in the back, not causing significant issues. - Inflammation and swelling: Fluid present around the spine, contributing to pain.  Urgent Care Course: - X-rays reviewed, no fractures or bone chips. - Prescription for hydrocodone  sent to pharmacy. - Prescription for Medrol  Dosepak sent to pharmacy.  Final Assessment: X-rays show no fractures or bone chips, only minor degenerative changes in the back. Fluid present around the spine contributing to pain. Hydrocodone  prescribed for pain management. Medrol  Dosepak prescribed for inflammation and  swelling.  Clinical Impression: - Back pain - Right hip pain - Inflammation and swelling  Patient Education: Discussed the use of hydrocodone  for pain management and Medrol  Dosepak for inflammation and swelling.  Portions of this note were created using the aid of voice recognition Dragon/DAX dictation software.   We discussed risks and side effects of medications, and also discussed red flags which would warrant immediate follow-up.   Urgent Care Disposition:  Home Care

## 2024-09-22 ENCOUNTER — Telehealth: Payer: Self-pay | Admitting: *Deleted

## 2024-09-22 ENCOUNTER — Ambulatory Visit

## 2024-09-22 ENCOUNTER — Other Ambulatory Visit: Payer: Self-pay

## 2024-09-22 VITALS — BP 117/78 | HR 104 | Temp 98.2°F | Ht 65.5 in | Wt 174.8 lb

## 2024-09-22 DIAGNOSIS — I2510 Atherosclerotic heart disease of native coronary artery without angina pectoris: Secondary | ICD-10-CM

## 2024-09-22 DIAGNOSIS — Z1231 Encounter for screening mammogram for malignant neoplasm of breast: Secondary | ICD-10-CM

## 2024-09-22 DIAGNOSIS — Z0000 Encounter for general adult medical examination without abnormal findings: Secondary | ICD-10-CM

## 2024-09-22 DIAGNOSIS — R7303 Prediabetes: Secondary | ICD-10-CM

## 2024-09-22 NOTE — Progress Notes (Signed)
 Internal Medicine Clinic Attending  Case discussed with the resident at the time of the visit.  We reviewed the resident's history and exam and pertinent patient test results.  I agree with the assessment, diagnosis, and plan of care documented in the resident's note.

## 2024-09-24 ENCOUNTER — Ambulatory Visit

## 2024-09-24 ENCOUNTER — Ambulatory Visit: Admitting: Internal Medicine

## 2024-09-24 DIAGNOSIS — S4991XS Unspecified injury of right shoulder and upper arm, sequela: Secondary | ICD-10-CM

## 2024-09-25 ENCOUNTER — Ambulatory Visit: Payer: Self-pay | Admitting: Clinical

## 2024-09-25 NOTE — BH Specialist Note (Unsigned)
 Behavioral Health Treatment Plan   [name]  MRN: [id number]   Treatment Plan Development Date: ***   Strengths: {BH Strengths:210964339}  Supports: {BH Supports:210964340}   Client Statement of Needs: ***   Treatment Level:***  Client Treatment Preferences:***   {BH Diagnoses:210964321}   Expected duration of treatment: ***  Party responsible for implementation of interventions: ***.   This plan has been reviewed and created by the following participants: ***   A new plan will be created at least every 12 months.  The patient fully participated in the development of treatment plan with the clinician and verbally consents to such treatment.   Patient Treatment Plan Signature Obtained: {BH Signature Options:210964341}   Warren JAYSON Mering, LCSW

## 2024-09-25 NOTE — BH Specialist Note (Unsigned)
 Integrated Behavioral Health Initial In-Person Visit  MRN: [id number] Name: Nicole [name]  Number of Integrated Behavioral Health Clinician visits: No data recorded Session Start time: No data recorded   Session End time: No data recorded Total time in minutes: No data recorded   Types of Service: {CHL AMB TYPE OF [phone]}  Interpretor:{yes wn:[id number]} Interpretor Name and Language: ***   Subjective: [name] is a 64 y.o. female accompanied by {CHL AMB ACCOMPANIED AB:[id number]} Patient was referred by *** for ***. Patient reports the following symptoms/concerns: *** Duration of problem: ***; Severity of problem: {Mild/Moderate/Severe:20260}  Objective: Mood: {BHH MOOD:22306} and Affect: {BHH AFFECT:22307} Risk of harm to self or others: {CHL AMB BH Suicide Current Mental Status:21022748}  Life Context: Family and Social: *** School/Work: *** Self-Care: *** Life Changes: ***  Patient and/or Family's Strengths/Protective Factors: {CHL AMB BH PROTECTIVE FACTORS:[phone]}  Goals Addressed: Patient will: Reduce symptoms of: {IBH Symptoms:21014056} Increase knowledge and/or ability of: {IBH Patient Tools:21014057}  Demonstrate ability to: {IBH Goals:21014053}  Progress towards Goals: {CHL AMB BH PROGRESS TOWARDS [phone]}  Interventions: Interventions utilized: {IBH Interventions:21014054}  Standardized Assessments completed: {IBH Screening Tools:21014051}     Patient and/or Family Response: ***  Patient Centered Plan: Patient is on the following Treatment Plan(s):  ***  Clinical Assessment/Diagnosis  No diagnosis found.   Assessment: Patient currently experiencing ***.   Patient may benefit from ***.  Plan: Follow up with behavioral health clinician on : *** Behavioral recommendations: *** Referral(s): {IBH Referrals:21014055}  Warren BROCKS Elka Satterfield, LCSW

## 2024-09-25 NOTE — BH Specialist Note (Unsigned)
 Integrated Behavioral Health Initial In-Person Visit  MRN: [id number] Name: Nicole [name]  Number of Integrated Behavioral Health Clinician visits: No data recorded Session Start time: No data recorded   Session End time: No data recorded Total time in minutes: No data recorded   Types of Service: {CHL AMB TYPE OF [phone]}  Interpretor:{yes wn:[id number]} Interpretor Name and Language: ***   Subjective: Nicole [name] is a 64 y.o. female accompanied by {CHL AMB ACCOMPANIED AB:[id number]} Patient was referred by *** for ***. Patient reports the following symptoms/concerns: *** Duration of problem: ***; Severity of problem: {Mild/Moderate/Severe:20260}  Objective: Mood: {BHH MOOD:22306} and Affect: {BHH AFFECT:22307} Risk of harm to self or others: {CHL AMB BH Suicide Current Mental Status:21022748}  Life Context: Family and Social: *** School/Work: *** Self-Care: *** Life Changes: ***  Patient and/or Family's Strengths/Protective Factors: {CHL AMB BH PROTECTIVE FACTORS:[phone]}  Goals Addressed: Patient will: Reduce symptoms of: {IBH Symptoms:21014056} Increase knowledge and/or ability of: {IBH Patient Tools:21014057}  Demonstrate ability to: {IBH Goals:21014053}  Progress towards Goals: {CHL AMB BH PROGRESS TOWARDS [phone]}  Interventions: Interventions utilized: {IBH Interventions:21014054}  Standardized Assessments completed: {IBH Screening Tools:21014051}     Patient and/or Family Response: ***  Patient Centered Plan: Patient is on the following Treatment Plan(s):  ***  Clinical Assessment/Diagnosis  No diagnosis found.   Assessment: Patient currently experiencing ***.   Patient may benefit from ***.  Plan: Follow up with behavioral health clinician on : *** Behavioral recommendations: *** Referral(s): {IBH Referrals:21014055}  Warren BROCKS Elka Satterfield, LCSW

## 2024-09-28 ENCOUNTER — Encounter

## 2024-09-30 ENCOUNTER — Ambulatory Visit

## 2024-10-07 ENCOUNTER — Ambulatory Visit

## 2024-10-12 ENCOUNTER — Ambulatory Visit: Admitting: Physical Therapy

## 2024-10-23 ENCOUNTER — Ambulatory Visit: Admitting: Physical Therapy
